# Patient Record
Sex: Male | Born: 1950
Health system: Southern US, Community
[De-identification: ages and names within clinical notes are randomized; demographics above are authoritative.]

## PROBLEM LIST (undated history)

## (undated) DIAGNOSIS — I639 Cerebral infarction, unspecified: Secondary | ICD-10-CM

## (undated) DIAGNOSIS — F329 Major depressive disorder, single episode, unspecified: Secondary | ICD-10-CM

## (undated) DIAGNOSIS — I2510 Atherosclerotic heart disease of native coronary artery without angina pectoris: Secondary | ICD-10-CM

## (undated) DIAGNOSIS — F32A Depression, unspecified: Secondary | ICD-10-CM

## (undated) DIAGNOSIS — I10 Essential (primary) hypertension: Secondary | ICD-10-CM

## (undated) DIAGNOSIS — F0390 Unspecified dementia without behavioral disturbance: Secondary | ICD-10-CM

## (undated) DIAGNOSIS — I519 Heart disease, unspecified: Secondary | ICD-10-CM

## (undated) DIAGNOSIS — R569 Unspecified convulsions: Secondary | ICD-10-CM

## (undated) DIAGNOSIS — R413 Other amnesia: Secondary | ICD-10-CM

## (undated) DIAGNOSIS — R51 Headache: Secondary | ICD-10-CM

## (undated) HISTORY — DX: Other amnesia: R41.3

## (undated) HISTORY — PX: WISDOM TOOTH EXTRACTION: SHX21

## (undated) HISTORY — PX: CORONARY ANGIOPLASTY: SHX604

## (undated) HISTORY — DX: Heart disease, unspecified: I51.9

---

## 1997-08-24 ENCOUNTER — Emergency Department (HOSPITAL_COMMUNITY): Admission: EM | Admit: 1997-08-24 | Discharge: 1997-08-24 | Payer: Self-pay | Admitting: Emergency Medicine

## 1998-07-24 ENCOUNTER — Inpatient Hospital Stay (HOSPITAL_COMMUNITY): Admission: EM | Admit: 1998-07-24 | Discharge: 1998-08-03 | Payer: Self-pay | Admitting: Emergency Medicine

## 1998-10-10 ENCOUNTER — Inpatient Hospital Stay (HOSPITAL_COMMUNITY): Admission: EM | Admit: 1998-10-10 | Discharge: 1998-10-13 | Payer: Self-pay | Admitting: Emergency Medicine

## 1999-11-18 ENCOUNTER — Encounter: Payer: Self-pay | Admitting: Emergency Medicine

## 1999-11-18 ENCOUNTER — Emergency Department (HOSPITAL_COMMUNITY): Admission: EM | Admit: 1999-11-18 | Discharge: 1999-11-19 | Payer: Self-pay | Admitting: Emergency Medicine

## 2000-06-07 ENCOUNTER — Encounter: Admission: RE | Admit: 2000-06-07 | Discharge: 2000-06-07 | Payer: Self-pay | Admitting: Family Medicine

## 2000-07-05 ENCOUNTER — Encounter: Admission: RE | Admit: 2000-07-05 | Discharge: 2000-07-05 | Payer: Self-pay | Admitting: Family Medicine

## 2000-11-01 ENCOUNTER — Encounter: Admission: RE | Admit: 2000-11-01 | Discharge: 2000-11-01 | Payer: Self-pay | Admitting: Family Medicine

## 2001-01-07 ENCOUNTER — Encounter: Admission: RE | Admit: 2001-01-07 | Discharge: 2001-01-07 | Payer: Self-pay | Admitting: Family Medicine

## 2001-03-21 ENCOUNTER — Encounter: Admission: RE | Admit: 2001-03-21 | Discharge: 2001-03-21 | Payer: Self-pay | Admitting: Family Medicine

## 2002-01-23 ENCOUNTER — Emergency Department (HOSPITAL_COMMUNITY): Admission: EM | Admit: 2002-01-23 | Discharge: 2002-01-23 | Payer: Self-pay | Admitting: Emergency Medicine

## 2002-01-23 ENCOUNTER — Encounter: Payer: Self-pay | Admitting: Emergency Medicine

## 2002-10-22 ENCOUNTER — Emergency Department (HOSPITAL_COMMUNITY): Admission: EM | Admit: 2002-10-22 | Discharge: 2002-10-22 | Payer: Self-pay | Admitting: Emergency Medicine

## 2002-10-22 ENCOUNTER — Encounter: Payer: Self-pay | Admitting: Emergency Medicine

## 2003-01-12 ENCOUNTER — Emergency Department (HOSPITAL_COMMUNITY): Admission: EM | Admit: 2003-01-12 | Discharge: 2003-01-12 | Payer: Self-pay | Admitting: Emergency Medicine

## 2003-05-26 ENCOUNTER — Emergency Department (HOSPITAL_COMMUNITY): Admission: EM | Admit: 2003-05-26 | Discharge: 2003-05-26 | Payer: Self-pay | Admitting: Emergency Medicine

## 2003-07-10 ENCOUNTER — Encounter: Admission: RE | Admit: 2003-07-10 | Discharge: 2003-07-10 | Payer: Self-pay | Admitting: Family Medicine

## 2003-07-20 ENCOUNTER — Encounter: Admission: RE | Admit: 2003-07-20 | Discharge: 2003-07-20 | Payer: Self-pay | Admitting: Sports Medicine

## 2003-08-10 ENCOUNTER — Encounter: Admission: RE | Admit: 2003-08-10 | Discharge: 2003-08-10 | Payer: Self-pay | Admitting: Family Medicine

## 2005-01-19 ENCOUNTER — Ambulatory Visit: Payer: Self-pay | Admitting: Family Medicine

## 2005-03-18 ENCOUNTER — Ambulatory Visit: Payer: Self-pay | Admitting: Family Medicine

## 2005-03-18 ENCOUNTER — Inpatient Hospital Stay (HOSPITAL_COMMUNITY): Admission: EM | Admit: 2005-03-18 | Discharge: 2005-03-24 | Payer: Self-pay | Admitting: Emergency Medicine

## 2005-03-20 ENCOUNTER — Ambulatory Visit: Payer: Self-pay | Admitting: Family Medicine

## 2005-08-02 ENCOUNTER — Ambulatory Visit: Payer: Self-pay | Admitting: Family Medicine

## 2005-08-16 ENCOUNTER — Ambulatory Visit: Payer: Self-pay | Admitting: Family Medicine

## 2005-08-29 ENCOUNTER — Ambulatory Visit: Payer: Self-pay | Admitting: Sports Medicine

## 2005-12-02 ENCOUNTER — Emergency Department (HOSPITAL_COMMUNITY): Admission: EM | Admit: 2005-12-02 | Discharge: 2005-12-02 | Payer: Self-pay | Admitting: Emergency Medicine

## 2005-12-15 ENCOUNTER — Ambulatory Visit: Payer: Self-pay | Admitting: Family Medicine

## 2006-01-11 ENCOUNTER — Ambulatory Visit: Payer: Self-pay | Admitting: Family Medicine

## 2006-03-08 ENCOUNTER — Ambulatory Visit: Payer: Self-pay | Admitting: Sports Medicine

## 2006-03-30 ENCOUNTER — Ambulatory Visit: Payer: Self-pay | Admitting: Family Medicine

## 2006-04-17 ENCOUNTER — Ambulatory Visit: Payer: Self-pay | Admitting: Family Medicine

## 2006-05-17 DIAGNOSIS — K649 Unspecified hemorrhoids: Secondary | ICD-10-CM | POA: Insufficient documentation

## 2006-05-17 DIAGNOSIS — M79609 Pain in unspecified limb: Secondary | ICD-10-CM

## 2006-05-17 DIAGNOSIS — K219 Gastro-esophageal reflux disease without esophagitis: Secondary | ICD-10-CM

## 2006-05-17 DIAGNOSIS — F339 Major depressive disorder, recurrent, unspecified: Secondary | ICD-10-CM | POA: Insufficient documentation

## 2006-05-17 DIAGNOSIS — F17200 Nicotine dependence, unspecified, uncomplicated: Secondary | ICD-10-CM | POA: Insufficient documentation

## 2006-05-17 DIAGNOSIS — I10 Essential (primary) hypertension: Secondary | ICD-10-CM | POA: Insufficient documentation

## 2006-05-30 ENCOUNTER — Ambulatory Visit: Payer: Self-pay | Admitting: Sports Medicine

## 2006-05-30 ENCOUNTER — Encounter (INDEPENDENT_AMBULATORY_CARE_PROVIDER_SITE_OTHER): Payer: Self-pay | Admitting: *Deleted

## 2006-05-30 LAB — CONVERTED CEMR LAB
Chloride: 107 meq/L (ref 96–112)
Glucose, Bld: 92 mg/dL (ref 70–99)
Potassium: 5 meq/L (ref 3.5–5.3)
Sodium: 142 meq/L (ref 135–145)

## 2006-06-04 ENCOUNTER — Telehealth: Payer: Self-pay | Admitting: *Deleted

## 2006-06-15 ENCOUNTER — Telehealth: Payer: Self-pay | Admitting: *Deleted

## 2006-08-08 ENCOUNTER — Emergency Department (HOSPITAL_COMMUNITY): Admission: EM | Admit: 2006-08-08 | Discharge: 2006-08-08 | Payer: Self-pay | Admitting: Emergency Medicine

## 2006-11-18 ENCOUNTER — Emergency Department (HOSPITAL_COMMUNITY): Admission: EM | Admit: 2006-11-18 | Discharge: 2006-11-18 | Payer: Self-pay | Admitting: Emergency Medicine

## 2006-11-18 ENCOUNTER — Ambulatory Visit: Payer: Self-pay | Admitting: Psychiatry

## 2006-11-18 ENCOUNTER — Inpatient Hospital Stay (HOSPITAL_COMMUNITY): Admission: AD | Admit: 2006-11-18 | Discharge: 2006-11-24 | Payer: Self-pay | Admitting: Psychiatry

## 2007-01-03 ENCOUNTER — Encounter (INDEPENDENT_AMBULATORY_CARE_PROVIDER_SITE_OTHER): Payer: Self-pay | Admitting: *Deleted

## 2007-01-03 ENCOUNTER — Ambulatory Visit: Payer: Self-pay | Admitting: Internal Medicine

## 2007-01-03 DIAGNOSIS — L84 Corns and callosities: Secondary | ICD-10-CM | POA: Insufficient documentation

## 2007-01-03 LAB — CONVERTED CEMR LAB
BUN: 12 mg/dL (ref 6–23)
CO2: 23 meq/L (ref 19–32)
Calcium: 8.8 mg/dL (ref 8.4–10.5)
Glucose, Bld: 76 mg/dL (ref 70–99)
Sodium: 143 meq/L (ref 135–145)

## 2007-01-14 ENCOUNTER — Encounter (INDEPENDENT_AMBULATORY_CARE_PROVIDER_SITE_OTHER): Payer: Self-pay | Admitting: *Deleted

## 2007-03-06 ENCOUNTER — Ambulatory Visit: Payer: Self-pay | Admitting: Family Medicine

## 2007-03-06 ENCOUNTER — Telehealth (INDEPENDENT_AMBULATORY_CARE_PROVIDER_SITE_OTHER): Payer: Self-pay | Admitting: *Deleted

## 2007-03-12 ENCOUNTER — Ambulatory Visit: Payer: Self-pay | Admitting: Sports Medicine

## 2007-04-02 ENCOUNTER — Encounter (INDEPENDENT_AMBULATORY_CARE_PROVIDER_SITE_OTHER): Payer: Self-pay | Admitting: *Deleted

## 2007-04-02 ENCOUNTER — Ambulatory Visit: Payer: Self-pay | Admitting: Family Medicine

## 2007-05-11 ENCOUNTER — Ambulatory Visit: Payer: Self-pay | Admitting: Psychiatry

## 2007-05-11 ENCOUNTER — Emergency Department (HOSPITAL_COMMUNITY): Admission: EM | Admit: 2007-05-11 | Discharge: 2007-05-11 | Payer: Self-pay | Admitting: Emergency Medicine

## 2007-05-11 ENCOUNTER — Inpatient Hospital Stay (HOSPITAL_COMMUNITY): Admission: AD | Admit: 2007-05-11 | Discharge: 2007-05-21 | Payer: Self-pay | Admitting: Psychiatry

## 2007-08-02 ENCOUNTER — Emergency Department (HOSPITAL_COMMUNITY): Admission: EM | Admit: 2007-08-02 | Discharge: 2007-08-02 | Payer: Self-pay | Admitting: Emergency Medicine

## 2007-08-03 ENCOUNTER — Ambulatory Visit: Payer: Self-pay | Admitting: *Deleted

## 2007-08-03 ENCOUNTER — Inpatient Hospital Stay (HOSPITAL_COMMUNITY): Admission: AD | Admit: 2007-08-03 | Discharge: 2007-08-09 | Payer: Self-pay | Admitting: *Deleted

## 2007-08-08 ENCOUNTER — Ambulatory Visit (HOSPITAL_COMMUNITY): Admission: RE | Admit: 2007-08-08 | Discharge: 2007-08-08 | Payer: Self-pay | Admitting: *Deleted

## 2007-08-13 ENCOUNTER — Emergency Department (HOSPITAL_COMMUNITY): Admission: EM | Admit: 2007-08-13 | Discharge: 2007-08-13 | Payer: Self-pay | Admitting: Emergency Medicine

## 2007-08-14 ENCOUNTER — Other Ambulatory Visit (HOSPITAL_COMMUNITY): Admission: RE | Admit: 2007-08-14 | Discharge: 2007-11-04 | Payer: Self-pay | Admitting: Psychiatry

## 2007-08-22 ENCOUNTER — Ambulatory Visit: Payer: Self-pay | Admitting: Psychiatry

## 2008-03-03 ENCOUNTER — Emergency Department (HOSPITAL_COMMUNITY): Admission: EM | Admit: 2008-03-03 | Discharge: 2008-03-04 | Payer: Self-pay | Admitting: Emergency Medicine

## 2008-03-04 ENCOUNTER — Inpatient Hospital Stay (HOSPITAL_COMMUNITY): Admission: EM | Admit: 2008-03-04 | Discharge: 2008-03-09 | Payer: Self-pay | Admitting: Psychiatry

## 2008-03-04 ENCOUNTER — Ambulatory Visit: Payer: Self-pay | Admitting: Psychiatry

## 2009-07-07 ENCOUNTER — Encounter: Payer: Self-pay | Admitting: Family Medicine

## 2009-07-07 ENCOUNTER — Ambulatory Visit (HOSPITAL_COMMUNITY): Admission: RE | Admit: 2009-07-07 | Discharge: 2009-07-07 | Payer: Self-pay | Admitting: Family Medicine

## 2009-07-07 ENCOUNTER — Ambulatory Visit: Payer: Self-pay | Admitting: Family Medicine

## 2009-07-07 DIAGNOSIS — F1020 Alcohol dependence, uncomplicated: Secondary | ICD-10-CM | POA: Insufficient documentation

## 2009-07-08 ENCOUNTER — Telehealth (INDEPENDENT_AMBULATORY_CARE_PROVIDER_SITE_OTHER): Payer: Self-pay | Admitting: *Deleted

## 2009-07-08 LAB — CONVERTED CEMR LAB
AST: 27 units/L (ref 0–37)
Alkaline Phosphatase: 99 units/L (ref 39–117)
BUN: 11 mg/dL (ref 6–23)
Creatinine, Ser: 1.07 mg/dL (ref 0.40–1.50)
HCT: 43.2 % (ref 39.0–52.0)
HDL: 113 mg/dL (ref >39)
Hemoglobin: 15 g/dL (ref 13.0–17.0)
LDL Cholesterol: 150 mg/dL — ABNORMAL HIGH (ref 0–99)
MCHC: 34.7 g/dL (ref 30.0–36.0)
Potassium: 4.1 meq/L (ref 3.5–5.3)
RDW: 15.2 % (ref 11.5–15.5)
Total CHOL/HDL Ratio: 2.5

## 2009-07-21 ENCOUNTER — Ambulatory Visit: Payer: Self-pay | Admitting: Family Medicine

## 2009-08-19 ENCOUNTER — Ambulatory Visit: Payer: Self-pay | Admitting: Family Medicine

## 2009-09-02 ENCOUNTER — Ambulatory Visit: Payer: Self-pay | Admitting: Family Medicine

## 2009-10-01 ENCOUNTER — Encounter: Payer: Self-pay | Admitting: Family Medicine

## 2009-10-01 ENCOUNTER — Ambulatory Visit: Payer: Self-pay | Admitting: Family Medicine

## 2009-10-01 LAB — CONVERTED CEMR LAB
Calcium: 9.5 mg/dL (ref 8.4–10.5)
Sodium: 140 meq/L (ref 135–145)

## 2009-10-04 ENCOUNTER — Telehealth: Payer: Self-pay | Admitting: *Deleted

## 2009-11-30 ENCOUNTER — Ambulatory Visit: Payer: Self-pay | Admitting: Family Medicine

## 2009-11-30 ENCOUNTER — Encounter: Payer: Self-pay | Admitting: Family Medicine

## 2009-11-30 ENCOUNTER — Observation Stay (HOSPITAL_COMMUNITY): Admission: EM | Admit: 2009-11-30 | Discharge: 2009-12-02 | Payer: Self-pay | Admitting: Family Medicine

## 2009-12-13 ENCOUNTER — Ambulatory Visit: Payer: Self-pay | Admitting: Family Medicine

## 2009-12-13 ENCOUNTER — Encounter: Payer: Self-pay | Admitting: Family Medicine

## 2009-12-13 DIAGNOSIS — E785 Hyperlipidemia, unspecified: Secondary | ICD-10-CM | POA: Insufficient documentation

## 2009-12-15 LAB — CONVERTED CEMR LAB
CO2: 24 meq/L (ref 19–32)
Chloride: 106 meq/L (ref 96–112)
HDL: 76 mg/dL (ref >39)
LDL Cholesterol: 135 mg/dL — ABNORMAL HIGH (ref 0–99)
Sodium: 140 meq/L (ref 135–145)
Total CHOL/HDL Ratio: 3

## 2009-12-24 ENCOUNTER — Ambulatory Visit: Payer: Self-pay | Admitting: Family Medicine

## 2010-02-24 ENCOUNTER — Emergency Department (HOSPITAL_COMMUNITY): Admission: EM | Admit: 2010-02-24 | Discharge: 2009-06-04 | Payer: Self-pay | Admitting: Emergency Medicine

## 2010-04-19 NOTE — Assessment & Plan Note (Signed)
Summary: f/u,df Recheck FLP,CMET 6 months   Vital Signs:  Patient profile:   60 year old male Height:      67 inches Weight:      148 pounds BMI:     23.26 BSA:     1.78 Temp:     98.8 degrees F Pulse rate:   80 / minute BP sitting:   160 / 100  Vitals Entered By: Jone Baseman CMA (December 24, 2009 11:20 AM) CC: F/U HTN, labs Is Patient Diabetic? No Pain Assessment Patient in pain? no        Primary Care Provider:  Milinda Antis MD  CC:  F/U HTN and labs.  History of Present Illness:     HTN- per report taking combo pill daily for the past 8 days, has not missed any doses. No chest pain, no leg swelling, no HA, feels good today.   Labs- reviewed lipid panel, need for statin    No alcohol since discharge from hospital 3 weeks ago now has AA sponsor  Habits & Providers  Alcohol-Tobacco-Diet     Tobacco Status: quit > 6 months     Tobacco Counseling: to remain off tobacco products     Cigarette Packs/Day: 1 ppd     Year Quit: 2007  Current Medications (verified): 1)  Lisinopril-Hydrochlorothiazide 20-25 Mg Tabs (Lisinopril-Hydrochlorothiazide) .Marland Kitchen.. 1 By Mouth Daily 2)  Aspir-Low 81 Mg Tbec (Aspirin) .Marland Kitchen.. 1 By Mouth Daily 3)  Multivitamins  Tabs (Multiple Vitamin) .Marland Kitchen.. 1 By Mouth Daily 4)  Simvastatin 10 Mg Tabs (Simvastatin) .Marland Kitchen.. 1 By Mouth Daily For Cholesterol 5)  Norvasc 5 Mg Tabs (Amlodipine Besylate) .Marland Kitchen.. 1 By Mouth Daily  Allergies (verified): No Known Drug Allergies  Past History:  Past Medical History: Completed drug/alcohol rehab in 4/04 in [place], hospitalized 1/07 for depresion w/ psychotic feat, Hx of Alcohol abuse, unspecified - 305.00 in [place],  Hx of Cocaine dependency NOS - 304.20 - in [place], hx of depression 296.30- resolved currently,  Hx of hematuria 09/1999 - no blood 04/05 hospitalized 9/08, 2010 for depression, suicide attempt, alcohol use HTN Hyperlipidemia  Physical Exam  General:  NAD, thin,, Vital signs  noted  Neck:  no cartoid bruit Lungs:  clear to auscultation bilaterally without wheezing, rales, or rhonchi.  Normal work of breathing  Heart:  Regular rate and rhythm without murmur, rub, or gallop.  Normal S1/S2  Pulses:  Radial, DP 2+ Extremities:  no edema    Impression & Recommendations:  Problem # 1:  HYPERTENSION, BENIGN SYSTEMIC (ICD-401.1) Assessment Improved   Mild improvement, pt previously on this medicine with improvement in BP. Will add low dose CCB, recheck in 2 weeks, goal < 140/90 His updated medication list for this problem includes:    Lisinopril-hydrochlorothiazide 20-25 Mg Tabs (Lisinopril-hydrochlorothiazide) .Marland Kitchen... 1 by mouth daily    Norvasc 5 Mg Tabs (Amlodipine besylate) .Marland Kitchen... 1 by mouth daily  Orders: [facility]- Est  Level 4 ([address]109)  Problem # 2:  HYPERLIPIDEMIA (ICD-272.4) Assessment: New  Aware of alcoholism, LFT wnl in April 2011. Will recheck in 6 months esp with low dose statin, discussed drinking while being on this medication His updated medication list for this problem includes:    Simvastatin 10 Mg Tabs (Simvastatin) .Marland Kitchen... 1 by mouth daily for cholesterol  Orders: [facility]- Est  Level 4 (99214)  Problem # 3:  ALCOHOLISM (ICD-303.90) Assessment: Unchanged  Now in AA with sponsor  Orders: [facility]- Est  Level 4 ([address]454)  Complete Medication List:  1)  Lisinopril-hydrochlorothiazide 20-25 Mg Tabs (Lisinopril-hydrochlorothiazide) .Marland Kitchen.. 1 by mouth daily 2)  Aspir-low 81 Mg Tbec (Aspirin) .Marland Kitchen.. 1 by mouth daily 3)  Multivitamins Tabs (Multiple vitamin) .Marland Kitchen.. 1 by mouth daily 4)  Simvastatin 10 Mg Tabs (Simvastatin) .Marland Kitchen.. 1 by mouth daily for cholesterol 5)  Norvasc 5 Mg Tabs (Amlodipine besylate) .Marland Kitchen.. 1 by mouth daily  Patient Instructions: 1)  Start the new blood pressure medication Novasc 5mg - take 1 tablet daily 2)  Continue taking the other blood pressure pill HCTZ/Lisinopril 3)  Start the cholesterol pill- Simvastatin -1 tablet daily 4)  Your next  visit is in 2 weeks  Prescriptions: NORVASC 5 MG TABS (AMLODIPINE BESYLATE) 1 by mouth daily  #30 x 3   Entered and Authorized by:   Milinda Antis MD   Signed by:   Milinda Antis MD on 12/24/2009   Method used:   Electronically to        [facility] [address] (retail)       [phone]       [place], [place]  [address]       Ph: [phone]       Fax: [phone]   RxID:   [id number] SIMVASTATIN 10 MG TABS (SIMVASTATIN) 1 by mouth daily for cholesterol  #30 x 3   Entered and Authorized by:   Milinda Antis MD   Signed by:   Milinda Antis MD on 12/24/2009   Method used:   Electronically to        [facility] [address] (retail)       [phone]       [place], [place]  [address]       Ph: [phone]       Fax: [phone]   RxID:   [id number]    Prevention & Chronic Care Immunizations   Influenza vaccine: Fluvax 3+  (01/03/2007)    Tetanus booster: 12/18/2000: Done.    Pneumococcal vaccine: Not documented    H. zoster vaccine: Not documented  Colorectal Screening   Hemoccult: negative  (03/06/2007)   Hemoccult due: 03/2008    Colonoscopy: Not documented  Other Screening   PSA: normal  (08/02/2005)   PSA due due: 08/2006   Smoking status: quit > 6 months  (12/24/2009)  Lipids   Total Cholesterol: 228  (12/13/2009)   LDL: 135  (12/13/2009)   LDL Direct: Not documented   HDL: 76  (12/13/2009)   Triglycerides: 86  (12/13/2009)    SGOT (AST): 27  (07/07/2009)   SGPT (ALT): 21  (07/07/2009)   Alkaline phosphatase: 99  (07/07/2009)   Total bilirubin: 0.7  (07/07/2009)    Lipid flowsheet reviewed?: Yes   Progress toward LDL goal: Unchanged  Hypertension   Last Blood Pressure: 160 / 100  (12/24/2009)   Serum creatinine: 1.18  (12/13/2009)   Serum potassium 4.2  (12/13/2009)    Hypertension flowsheet reviewed?: Yes   Progress toward BP goal: Improved  Self-Management  Support :   Personal Goals (by the next clinic visit) :      Personal blood pressure goal: 140/90  (10/01/2009)     Personal LDL goal: 100  (12/24/2009)    Hypertension self-management support: Not documented    Lipid self-management support: Not documented    Appended Document: f/u,df Recheck FLP,CMET 6 months pt declined flu shot today

## 2010-04-19 NOTE — Assessment & Plan Note (Signed)
Summary: hfu,df   Vital Signs:  Patient profile:   60 year old male Height:      67 inches Weight:      145.3 pounds BMI:     22.84 Pulse rate:   82 / minute BP sitting:   179 / 100  (right arm)  Vitals Entered By: Arlyss Repress CMA, (December 13, 2009 11:57 AM)  Serial Vital Signs/Assessments:  Time      Position  BP       Pulse  Resp  Temp     By 12:01 PM            180/102                        Arlyss Repress CMA,  Comments: 12:01 PM left arm. By: Arlyss Repress CMA,   CC: f/up HTN Is Patient Diabetic? No Pain Assessment Patient in pain? no        Primary Care Provider:  Milinda Antis MD  CC:  f/up HTN.  History of Present Illness:   patient s/p hospitalization for acute alcohol intoxication with prolonged ataxia. Pt has not had any ETOH per report, plans to attend AA today.  He is 45 minutes late for appt  HTN- has not taken any medications, on his way to pharmacy today, no HA,no chest pain  Habits & Providers  Alcohol-Tobacco-Diet     Tobacco Status: quit > 6 months     Tobacco Counseling: to remain off tobacco products     Year Quit: 2007  Current Medications (verified): 1)  Lisinopril-Hydrochlorothiazide 20-25 Mg Tabs (Lisinopril-Hydrochlorothiazide) .Marland Kitchen.. 1 By Mouth Daily 2)  Aspir-Low 81 Mg Tbec (Aspirin) .Marland Kitchen.. 1 By Mouth Daily 3)  Multivitamins  Tabs (Multiple Vitamin) .Marland Kitchen.. 1 By Mouth Daily  Allergies (verified): No Known Drug Allergies  Physical Exam  General:  NAD, thin, sitting up in chair, Vital signs noted  Lungs:  clear to auscultation bilaterally without wheezing, rales, or rhonchi.  Normal work of breathing  Heart:  Regular rate and rhythm without murmur, rub, or gallop.  Normal S1/S2  Neurologic:  Gait- limp on right side, no ataxia  mentation in tact no apparent hallucinations   Impression & Recommendations:  Problem # 1:  HYPERTENSION, BENIGN SYSTEMIC (ICD-401.1) Assessment Deteriorated Restart combo pill, pt BP fairly  controlled when taking it regularly. Check lytes His updated medication list for this problem includes:    Lisinopril-hydrochlorothiazide 20-25 Mg Tabs (Lisinopril-hydrochlorothiazide) .Marland Kitchen... 1 by mouth daily  Orders: [facility] ([address]-98119) [facility]- Est  Level 4 (14782)  Problem # 2:  ALCOHOLISM (ICD-303.90) Assessment: Deteriorated  Discussed abstinence, pt states he fell off the band wagon when he found out his brother was sick. Discussed AA, he does not want to go to rehab  Orders: [facility]- Est  Level 4 ([address]21)  Problem # 3:  HYPERLIPIDEMIA (ICD-272.4) Assessment: Unchanged Recheck lipids, likley will need statin Orders: Lipid-[facility] (30865-78469) [facility]- Est  Level 4 (62952)  Complete Medication List: 1)  Lisinopril-hydrochlorothiazide 20-25 Mg Tabs (Lisinopril-hydrochlorothiazide) .Marland Kitchen.. 1 by mouth daily 2)  Aspir-low 81 Mg Tbec (Aspirin) .Marland Kitchen.. 1 by mouth daily 3)  Multivitamins Tabs (Multiple vitamin) .Marland Kitchen.. 1 by mouth daily  Patient Instructions: 1)  Return in 1 week, to follow-up on your blood pressure  2)  We will discuss your labs at that time Prescriptions: MULTIVITAMINS  TABS (MULTIPLE VITAMIN) 1 by mouth daily  #30 x 12   Entered and Authorized by:   Kingsley Spittle  Richards MD   Signed by:   Milinda Antis MD on 12/13/2009   Method used:   Electronically to        [facility] [phone]* (retail)       [address]       [place], [place]  [address]       Ph: [phone]       Fax: [phone]   RxID:   [id number] LISINOPRIL-HYDROCHLOROTHIAZIDE 20-25 MG TABS (LISINOPRIL-HYDROCHLOROTHIAZIDE) 1 by mouth daily  #30 x 3   Entered and Authorized by:   Milinda Antis MD   Signed by:   Milinda Antis MD on 12/13/2009   Method used:   Electronically to        [facility] [phone]* (retail)       [address]       [place], [place]  [address]       Ph: [phone]       Fax: [phone]   RxID:   [id number]    Prevention & Chronic Care Immunizations    Influenza vaccine: Fluvax 3+  (01/03/2007)    Tetanus booster: 12/18/2000: Done.    Pneumococcal vaccine: Not documented    H. zoster vaccine: Not documented  Colorectal Screening   Hemoccult: negative  (03/06/2007)   Hemoccult due: 03/2008    Colonoscopy: Not documented  Other Screening   PSA: normal  (08/02/2005)   PSA due due: 08/2006   Smoking status: quit > 6 months  (12/13/2009)  Lipids   Total Cholesterol: 281  (07/07/2009)   LDL: 150  (07/07/2009)   LDL Direct: Not documented   HDL: 113  (07/07/2009)   Triglycerides: 92  (07/07/2009)    SGOT (AST): 27  (07/07/2009)   SGPT (ALT): 21  (07/07/2009)   Alkaline phosphatase: 99  (07/07/2009)   Total bilirubin: 0.7  (07/07/2009)  Hypertension   Last Blood Pressure: 179 / 100  (12/13/2009)   Serum creatinine: 1.20  (10/01/2009)   Serum potassium 4.3  (10/01/2009)    Hypertension flowsheet reviewed?: Yes   Progress toward BP goal: Deteriorated  Self-Management Support :   Personal Goals (by the next clinic visit) :      Personal blood pressure goal: 140/90  (10/01/2009)   Hypertension self-management support: Not documented    Lipid self-management support: Not documented

## 2010-04-19 NOTE — Assessment & Plan Note (Signed)
Summary: BP CHECK/KH  Nurse Visit  In for BP check today. BP checked manually using regular adult cuff. BP LA 130/94 , RA 124/88 pulse 84. Patient reports he is feeling much better. Has a follow up with Dr. Gilford Raid on 08/06/2009. He taking medication as directed. Dr. Jeanice Lim notified and she came in and spoke with patient. Continue on same med no change, follow up as planned. Theresia Lo RN  Jul 21, 2009 9:24 AM   Allergies: No Known Drug Allergies  Orders Added: 1)  No Charge Patient Arrived (NCPA0) [NCPA0]

## 2010-04-19 NOTE — Assessment & Plan Note (Signed)
Summary: BP CHECK/KH  Nurse Visit  BP checked manually . LA 140/90 , RA 140/90 pulse 80. patient did restart medication as directed and has taken today. appointment scheduled for follow up with Dr. Jeanice Lim for 09/16/2009 and will have labs done at that time. Theresia Lo RN  September 02, 2009 9:26 AM   Allergies: No Known Drug Allergies  Orders Added: 1)  No Charge Patient Arrived (NCPA0) [NCPA0]

## 2010-04-19 NOTE — Progress Notes (Signed)
Summary: Rx Prob  Phone Note Call from Patient Call back at Home Phone [phone]   Caller: Patient Summary of Call: Pt would like to have his rx from yesterday sent to the [facility] on [address]. Initial call taken by: Clydell Hakim,  July 08, 2009 11:12 AM  Follow-up for Phone Call        spoke with  patient and advised him to call [facility] on [address] and ask them to transfer Rx sent in yesterday to [facility] [address]. he voices understanding. gave him the phone number to call. Follow-up by: Theresia Lo RN,  July 08, 2009 11:25 AM

## 2010-04-19 NOTE — Assessment & Plan Note (Signed)
Summary: follow up Bp and labs/ls   Vital Signs:  Patient profile:   60 year old male Weight:      150.9 pounds Pulse rate:   73 / minute BP sitting:   143 / 84  (left arm) Cuff size:   regular  Vitals Entered By: Arlyss Repress CMA, (October 01, 2009 9:06 AM) CC: f/up HTN Is Patient Diabetic? No Pain Assessment Patient in pain? no        Primary Care Provider:  Milinda Antis MD  CC:  f/up HTN.  History of Present Illness:   f/u HTN and review labs  HTN- taking HCTZ/lisinopril without any complications, no chest pain, HA, SOB, leg swelling quit alcohol 3 weeks ago   currently without knee pain   Habits & Providers  Alcohol-Tobacco-Diet     Tobacco Status: quit > 6 months  Current Medications (verified): 1)  Lisinopril-Hydrochlorothiazide 20-25 Mg Tabs (Lisinopril-Hydrochlorothiazide) .Marland Kitchen.. 1 By Mouth Daily 2)  Aspir-Low 81 Mg Tbec (Aspirin) .Marland Kitchen.. 1 By Mouth Daily  Allergies (verified): No Known Drug Allergies  Social History: Smoking Status:  quit > 6 months  Physical Exam  General:  Well-developed,well-nourished,in no acute distress; alert,appropriate and cooperative throughout examination Vital signs noted    Impression & Recommendations:  Problem # 1:  HYPERTENSION, BENIGN SYSTEMIC (ICD-401.1) Assessment Improved Continue current meds repeat BMET for tolerance of ACEI Recheck CHolesterol at next visit His updated medication list for this problem includes:    Lisinopril-hydrochlorothiazide 20-25 Mg Tabs (Lisinopril-hydrochlorothiazide) .Marland Kitchen... 1 by mouth daily  Orders: Basic [facility] ([id number]) [facility]- Est Level  3 (40102)  Complete Medication List: 1)  Lisinopril-hydrochlorothiazide 20-25 Mg Tabs (Lisinopril-hydrochlorothiazide) .Marland Kitchen.. 1 by mouth daily 2)  Aspir-low 81 Mg Tbec (Aspirin) .Marland Kitchen.. 1 by mouth daily  Patient Instructions: 1)  Restart your blood pressure pill every day 2)  Continue taking your baby aspirin 3)  It is okay to take 1 advil a  day for knee pain 4)  The nurse will call with your lab results 5)  Return in 3 months- try to get a morning appt, do eat after midnight    Prevention & Chronic Care Immunizations   Influenza vaccine: Fluvax 3+  (01/03/2007)    Tetanus booster: 12/18/2000: Done.    Pneumococcal vaccine: Not documented    H. zoster vaccine: Not documented  Colorectal Screening   Hemoccult: negative  (03/06/2007)   Hemoccult due: 03/2008    Colonoscopy: Not documented  Other Screening   PSA: normal  (08/02/2005)   PSA due due: 08/2006   Smoking status: quit > 6 months  (10/01/2009)  Lipids   Total Cholesterol: 281  (07/07/2009)   LDL: 150  (07/07/2009)   LDL Direct: Not documented   HDL: 113  (07/07/2009)   Triglycerides: 92  (07/07/2009)  Hypertension   Last Blood Pressure: 143 / 84  (10/01/2009)   Serum creatinine: 1.07  (07/07/2009)   Serum potassium 4.1  (07/07/2009)    Hypertension flowsheet reviewed?: Yes   Progress toward BP goal: At goal  Self-Management Support :   Personal Goals (by the next clinic visit) :      Personal blood pressure goal: 140/90  (10/01/2009)   Hypertension self-management support: Not documented

## 2010-04-19 NOTE — Initial Assessments (Signed)
Visit Type:  Hospital Admission Primary Care Provider:  Milinda Antis MD  CC:  ataxia.  History of Present Illness: Pt. is a 60 y/o aam who was brought to [facility] by EMS after becoming intoxicated with Alcohol. He was held in the ER there till his alcohol levels came down from 400 to 115. They attempted to walk the patient and he was unable to walk. He was shaky and weak. They transferred the patient to [facility] for evaluation of new onset ataxia. The patient describes a drinking episode of 2 40 ounce bottles of beer and then passing out.  he has a history of alcohol and drug abuse and has been admitted to rehab facilities 3 times. He also sees AA occasionally.  Current Problems (verified): 1)  Alcoholism  (ICD-303.90) 2)  Special Screening For Malignant Neoplasms Colon  (ICD-V76.51) 3)  Callus, Toe  (ICD-700) 4)  Tobacco Dependence  (ICD-305.1) 5)  Leg Pain or Knee Pain  (ICD-729.5) 6)  Hypertension, Benign Systemic  (ICD-401.1) 7)  Hemorrhoids, Nos  (ICD-455.6) 8)  Gastroesophageal Reflux, No Esophagitis  (ICD-530.81) 9)  Depression, Major, Recurrent  (ICD-296.30)  Current Medications (verified): 1)  Lisinopril-Hydrochlorothiazide 20-25 Mg Tabs (Lisinopril-Hydrochlorothiazide) .Marland Kitchen.. 1 By Mouth Daily 2)  Aspir-Low 81 Mg Tbec (Aspirin) .Marland Kitchen.. 1 By Mouth Daily  Allergies (verified): No Known Drug Allergies  Past History:  Family History: Last updated: 25-May-2006 4 sisters, one died in 63`s of breast CA, 5 brothers, 2 with diabetes, father died, DM, ESRD, Mother died of stroke at 61; also had HTN.  Social History: Last updated: 07/07/2009 Hx of ETOH abuse -  detox at [facility], in [place], completed program at [facility]. relapsed 9/08.; quitting smoking- quit date 03/20/06; 2 cups of coffee per day; ; Lives alone,  daughters- Morrie Sheldon, [place]  Multiple Behavioral health admissions for [employer] POA   Risk Factors: Smoking Status: quit > 6 months  (10/01/2009) Packs/Day: 1 ppd (03/06/2007)  Past medical, surgical, family and social histories (including risk factors) reviewed, and no changes noted (except as noted below).  Past Medical History: Reviewed history from 07/07/2009 and no changes required. Completed drug/alcohol rehab in 4/04 in [place], hospitalized 1/07 for depresion w/ psychotic feat, Hx of Alcohol abuse, unspecified - 305.00 in remis,  Hx of Cocaine dependency NOS - 304.20 - in remissi, hx of depression 296.30- resolved currently,  Hx of hematuria 09/1999 - no blood 04/05 hospitalized 9/08, 2010 for depression, suicide attempt, alcohol use HTN  Past Surgical History: Reviewed history from 05-25-2006 and no changes required. Lumbar puncture:  negative rpr, hiv - 03/20/2005, MRI brain neg for stroke, tumor, bleed - 03/20/2005  Family History: Reviewed history from 05-25-06 and no changes required. 4 sisters, one died in 31`s of breast CA, 5 brothers, 2 with diabetes, father died, DM, ESRD, Mother died of stroke at 77; also had HTN.  Social History: Reviewed history from 07/07/2009 and no changes required. Hx of ETOH abuse -  detox at [facility], in [place], completed program at [facility]. relapsed 9/08.; quitting smoking- quit date 03/20/06; 2 cups of coffee per day; ; Lives alone,  daughters- Cyndy Freeze  Multiple Behavioral health admissions for SI  Morrie Sheldon POA   Review of Systems       The patient complains of muscle weakness and depression.  The patient denies fever, vision loss, decreased hearing, chest pain, dyspnea on exertion, and abdominal pain.    Physical Exam  General:  alert, well-developed, cooperative to examination, and underweight appearing.   Head:  normocephalic, atraumatic, and male-pattern balding.   Eyes:  vision grossly intact, pupils equal, pupils round, and pupils reactive to light.   Ears:  R ear normal and L ear normal.   Nose:  no external deformity, no external erythema, and no  nasal discharge.   Mouth:  edentulous.   Neck:  supple and full ROM.   Chest Wall:  no deformities and no tenderness.   Lungs:  normal respiratory effort, normal breath sounds, no crackles, and no wheezes.   Heart:  normal rate, regular rhythm, and no murmur.   Abdomen:  soft, non-tender, no distention, and no guarding.   Msk:  normal ROM.   Neurologic:  alert & oriented X3, cranial nerves II-XII intact, strength normal in all extremities, sensation intact to light touch, finger-to-nose normal, and ataxic gait.   Psych:  Oriented X3, normally interactive, not anxious appearing, depressed affect, and subdued.    Pt. is a 60 y/o aam with alcholic binge and new onset ataxia likely related to recent binge.  Impression & Recommendations:  Problem # 1:  ALCOHOLISM (ICD-303.90) Assessment Deteriorated Recent alcohol binge.  will continue with IV hydration and oral diet  Problem # 2:  Ataxia Assessment: New Likely related to his recent alcohol binge. patient is weak, but neurologically intact. Will observe for improvement with more rest and hydration.  Problem # 3:  HYPERTENSION, BENIGN SYSTEMIC (ICD-401.1)  His updated medication list for this problem includes:    Lisinopril-hydrochlorothiazide 20-25 Mg Tabs (Lisinopril-hydrochlorothiazide) .Marland Kitchen... 1 by mouth daily  Problem # 4:  FEN - GI SCD's subq heparin Protonix Regular diet  Problem # 5:  Disposition Will assess gait again in the morning, if his strength and gait return to normal he can be discharged.  Complete Medication List: 1)  Lisinopril-hydrochlorothiazide 20-25 Mg Tabs (Lisinopril-hydrochlorothiazide) .Marland Kitchen.. 1 by mouth daily 2)  Aspir-low 81 Mg Tbec (Aspirin) .Marland Kitchen.. 1 by mouth daily  Appended Document:  LABS:     137               135-145          mEq/L  Potassium (K)                            3.7               3.5-5.1          mEq/L  Chloride                                 100               96-112           mEq/L   CO2                                      24                19-32            mEq/L  Glucose                                  87  70-99            mg/dL  BUN                                      10                6-23             mg/dL  Creatinine                               1.15              0.4-1.5          mg/dL  GFR, Est Non African American            >60               >60              mL/min  GFR, Est African American                >60               >60              mL/min    Oversized comment, see footnote  1  Calcium                                  8.8               Alcohol: 283, 131 Drug Screen: Negative   WBC                                      8.1               4.0-10.5         K/uL  RBC                                      4.46              4.22-5.81        MIL/uL  Hemoglobin (HGB)                         13.9              13.0-17.0        g/dL  Hematocrit (HCT)                         40.6              39.0-52.0        %  MCV                                      91.0              78.0-100.0       fL  MCH -  31.1              26.0-34.0        pg  MCHC                                     34.2              30.0-36.0        g/dL  RDW                                      13.6              11.5-15.5        %  Platelet Count (PLT)                     293               150-400          K/uL   Bilirubin, Total                         1.1               0.3-1.2          mg/dL  Bilirubin, Direct                        0.1               0.0-0.3          mg/dL  Indirect Bilirubin                       1.0        h      0.3-0.9          mg/dL  Alkaline Phosphatase                     76                39-117           U/L  SGOT (AST)                               24                0-37             U/L  SGPT (ALT)                               17                0-53             U/L  Total  Protein                           7.2                6.0-8.3          g/dL  Albumin-Blood  4.0               3.5-5.2          g/dL  CT HEAD:  CT HEAD WITHOUT CONTRAST    Technique:  Contiguous axial images were obtained from the base of   the skull through the vertex without contrast.    Comparison: 08/13/2007    Findings: Chronic small vessel white matter ischemic changes are   identified.    No acute intracranial abnormalities are identified, including mass   lesion or mass effect, hydrocephalus, extra-axial fluid collection,   midline shift, hemorrhage, or acute infarction.    The visualized bony calvarium is unremarkable.    IMPRESSION:   No evidence of acute intracranial abnormality.    Chronic small vessel white matter ischemic changes.  Appended Document:      Primary Care Provider:  Milinda Antis MD   History of Present Illness: HPI:  60 yo M with PMH significant only for HTN and chronic alcoholism who presents today for admission to [facility] after being held in ED for over 17 hours for acute alcohol intoxicity.  Initially brough to ED by police due to public drunkeness and beligerrence.  In ED his initial EtOH levels were around 400 but came down to mid 100s while in ED.  When they tried to walk the patient, he was shaky and had difficulty walking.  EDP then called for admission.  Patient states he began drinking 2 days ago as his brother had a heart attack.  Pt told nurse he lost his job  days ago as well, although he told this examiner he lost his job over a year ago.  Initially states he passed out though when pressed on this issue admits that he did not.  Admits to 1 6 pack per week and 2 40 ounce beverages 2 days ago prior to coming to ED due to alcoholism.    Please see excellent R1 note for PMH, Medications, allergies, Family history, social history, and ROS.    Current Problems (verified): 1)  Alcoholism  (ICD-303.90) 2)  Special Screening For Malignant Neoplasms Colon   (ICD-V76.51) 3)  Callus, Toe  (ICD-700) 4)  Tobacco Dependence  (ICD-305.1) 5)  Leg Pain or Knee Pain  (ICD-729.5) 6)  Hypertension, Benign Systemic  (ICD-401.1) 7)  Hemorrhoids, Nos  (ICD-455.6) 8)  Gastroesophageal Reflux, No Esophagitis  (ICD-530.81) 9)  Depression, Major, Recurrent  (ICD-296.30)  Current Medications (verified): 1)  Lisinopril-Hydrochlorothiazide 20-25 Mg Tabs (Lisinopril-Hydrochlorothiazide) .Marland Kitchen.. 1 By Mouth Daily 2)  Aspir-Low 81 Mg Tbec (Aspirin) .Marland Kitchen.. 1 By Mouth Daily  Allergies (verified): No Known Drug Allergies  Physical Exam  General:  Thin male lying in hospital bed, conversant and pleasant.   Head:  normocephalic and atraumatic.   Eyes:  vision grossly intact, pupils equal, pupils round, and pupils reactive to light.   Ears:  R ear normal and L ear normal.   Nose:  External nasal examination shows no deformity or inflammation. Nasal mucosa are pink and moist without lesions or exudates. Mouth:  Oral mucosa pink and moist Dentition:  edentulous Neck:  No deformities, masses, or tenderness noted. Lungs:  clear to auscultation bilaterally without wheezing, rales, or rhonchi.  Normal work of breathing  Heart:  Regular rate and rhythm without murmur, rub, or gallop.  Normal S1/S2  Abdomen:  soft/nondistended/nontender.  Bowel sounds present and normoactive.  No organomegaly noted.   Msk:  normal ROM and strength Pulses:  R and  L carotid,radial,femoral,dorsalis pedis and posterior tibial pulses are full and equal bilaterally Extremities:  No clubbing, cyanosis, edema, or deformity noted with normal full range of motion of all joints.   Neurologic:  No cranial nerve deficits noted.  Plantar reflexes are down-going bilaterally. DTRs are symmetrical throughout. Sensory, motor and coordinative functions appear intact.  Finger to nose with some decreased speed but able to fully complete task.  Gait markedly diminished, unable to stand or walk without 2 person  assistance  Skin:  Intact without suspicious lesions or rashes Psych:  Oriented X3, memory intact for recent and remote, normally interactive, good eye contact, and not anxious appearing.     Impression & Recommendations:  Problem # 1:  ALCOHOLISM (ICD-303.90) Assessment Deteriorated Likely acute intoxication event as most likely etiology for recent difficulty walking.  Of note, I went back to further examine patient after leaving the room and he had walked into the bathroom without difficulty and without assistance.  Nurse helped patient back to bed and said his shakiness had greatly decreased. Patient also asked for food, stating this was his only request and only thing he needed.  Other concerns would be syphilis or B12 deficiency, although CBC is WNL and syphilis less likely without other symptoms or signs on exam.  Wil check in AM.  patient also appeared clinically dry on exam, willl give 75 cc fluids overnight as maintenance and then recheck Hgb in AM to make sure he is not hemo-concentrated.  CT scan negative in ED, less likely neuro etiology though this is always possibility.  Will need to re-evaluate in AM once EtOH levels have normalized.    Problem # 2:  HYPERTENSION, BENIGN SYSTEMIC (ICD-401.1) Assessment: Deteriorated Systolics >180.  Likely not taking his medication.  Will continue his current medication here.  No signs of bleed on CT exam.  Will need to follow closely.  If remains high or continues to rise, can add hydralazine or labetolol.  His updated medication list for this problem includes:    Lisinopril-hydrochlorothiazide 20-25 Mg Tabs (Lisinopril-hydrochlorothiazide) .Marland Kitchen... 1 by mouth daily  Problem # 3:  FEN/GI Fluids as above.  No need to continue this past tomorrow AM as he is tolerating by mouth intake.    Problem # 4:  Prophylaxis Heparin 5000 units subQ three times a day.  Will hold on PPI for now although patient does have history of GERD, not on any home meds.     Problem # 5:  Dispo Hopefully DC home tomorrow if clinical condition improved.    Complete Medication List: 1)  Lisinopril-hydrochlorothiazide 20-25 Mg Tabs (Lisinopril-hydrochlorothiazide) .Marland Kitchen.. 1 by mouth daily 2)  Aspir-low 81 Mg Tbec (Aspirin) .Marland Kitchen.. 1 by mouth daily

## 2010-04-19 NOTE — Assessment & Plan Note (Signed)
Summary: f/up blood meds,tcb   Vital Signs:  Patient profile:   60 year old male Weight:      150 pounds Temp:     98.2 degrees F oral Pulse rate:   85 / minute BP sitting:   192 / 110  (right arm) Cuff size:   regular  Vitals Entered By: Tessie Fass CMA (July 07, 2009 1:43 PM) CC: BP check Is Patient Diabetic? No Pain Assessment Patient in pain? yes     Location: right knee Intensity: 8   Primary Care Provider:  Milinda Antis MD  CC:  BP check.  History of Present Illness: Pt here to re-establish care with new PCP  1, ETOH abuse- drinks 2-3 cans of beer a day, on weekend increases but does not say how much, does not eat very much during the day    plans to restart AA meetings for alcohol  last behavioral admission was in Dec of last year for SI with alcohol on board At this time stressed out because he is  unable to find work , his fiances are limited and his daughter controls his money, he lives in an apartment by himself and finds that he is very bored and feels tied down . plans to go to unemployment office in morning to find work Pt admitted he was at  [facility]  approx  1 month ago for detox  No SI  2. HTN- stopped taking medication over a year ago, No chest pain, no SOB, occasional headaches  walks everywhere   reviewed medical and family history  Habits & Providers  Alcohol-Tobacco-Diet     Tobacco Status: quit  Current Medications (verified): 1)  None  Allergies (verified): No Known Drug Allergies  Past History:  Past Medical History: Completed drug/alcohol rehab in 4/04 in [place], hospitalized 1/07 for depresion w/ psychotic feat, Hx of Alcohol abuse, unspecified - 305.00 in remis,  Hx of Cocaine dependency NOS - 304.20 - in [facility], hx of depression 296.30- resolved currently,  Hx of hematuria 09/1999 - no blood 04/05 hospitalized 9/08, 2010 for depression, suicide attempt, alcohol use HTN  Social History: Hx of ETOH abuse -  detox at  [place], in [place], completed program at [facility]. relapsed 9/08.; quitting smoking- quit date 03/20/06; 2 cups of coffee per day; ; Lives alone,  daughters- Morrie Sheldon, [place]  Multiple Behavioral health admissions for SI  Morrie Sheldon POA Smoking Status:  quit  Physical Exam  General:  Well-developed,well-nourished,in no acute distress; alert,appropriate and cooperative throughout examination Vital signs noted  Eyes:  No vision grossly intact, pupils equal, pupils round, pupils reactive to light, and no optic disk abnormalities.   Mouth:  Poor dentition, MMM Neck:  No carotid bruit  Lungs:  CTAB Heart:  RRR, no mumur Abdomen:  soft, non-tender, normal bowel sounds, and no masses.   Pulses:  2+ Extremities:  No edema   Impression & Recommendations:  Problem # 1:  HYPERTENSION, BENIGN SYSTEMIC (ICD-401.1) Assessment Deteriorated No meds for at least 2 years, pt assymptatic , EKG shows RBBB and LVH secondary to HTN disease, will obtain lab work today, no other ekg could be found including hospital EMR. Start combo pill today RTC 2 weeks, will likley need 2-3 meds to control blood pressure discussed salt restriction, however will be diffuclt as pt does not eat very much secondary to calories with ETOH The following medications were removed from the medication list:    Norvasc 10 Mg Tabs (Amlodipine  besylate) .Marland Kitchen... Take 1 tablet by mouth once a day    Enalapril-hydrochlorothiazide 10-25 Mg Tabs (Enalapril-hydrochlorothiazide) .Marland Kitchen... 1 by mouth once daily His updated medication list for this problem includes:    Lisinopril-hydrochlorothiazide 20-25 Mg Tabs (Lisinopril-hydrochlorothiazide) .Marland Kitchen... 1 by mouth daily  Orders: Comp Met-[facility] [phone]) CBC-[facility] (40347) Lipid-[facility] ([id number]) [facility]- Est  Level 4 (64332)  Problem # 2:  ALCOHOLISM (ICD-303.90) Assessment: Improved  Per report ETOH use has improved, pt very bored at home, long standing history of depression, not on meds, encouraged  AA meetings will follow - this is a very diffucult problem based on his history pt also has poor intake secondary to etoh  Orders: [facility]- Est  Level 4 (99214)  Problem # 3:  DEPRESSION, MAJOR, RECURRENT (ICD-296.30) Assessment: Improved No recent BH admission for depression and ETOH binges, pt not on any meds at this will consider in the near future, his daughters are controlling his finances so he feels he has a lost a lot of control he is also out of work for 1 year contributing to stress. NO SI today Consider SSRI Orders: [facility]- Est  Level 4 (95188)  Complete Medication List: 1)  Lisinopril-hydrochlorothiazide 20-25 Mg Tabs (Lisinopril-hydrochlorothiazide) .Marland Kitchen.. 1 by mouth daily 2)  Aspir-low 81 Mg Tbec (Aspirin) .Marland Kitchen.. 1 by mouth daily  Patient Instructions: 1)  Return for nurse visit in 2 weeks to have your blood pressure checked 2)  Make an appt to see me in 1 month 3)  Your blood pressure is very high 4)  If you can take your blood pressure at [facility] and write it down 5)  We will call you with your lab results Prescriptions: ASPIR-LOW 81 MG TBEC (ASPIRIN) 1 by mouth daily  #30 x 3   Entered and Authorized by:   Milinda Antis MD   Signed by:   Milinda Antis MD on 07/07/2009   Method used:   Electronically to        [facility] [phone]* (retail)       [address]       [place], [place]  [address]       Ph: [phone]       Fax: [phone]   RxID:   [id number] LISINOPRIL-HYDROCHLOROTHIAZIDE 20-25 MG TABS (LISINOPRIL-HYDROCHLOROTHIAZIDE) 1 by mouth daily  #30 x 3   Entered and Authorized by:   Milinda Antis MD   Signed by:   Milinda Antis MD on 07/07/2009   Method used:   Electronically to        [facility] [address] [phone]* (retail)       [address]       [place], [place]  [address]       Ph: [phone]       Fax: [phone]   RxID:   [id number]   Appended Document: f/up blood meds,tcb Addition: Pt asked about his right knee, we did not have  time to go into detail secondary to his other medical problems, told to use ACE wrap  Exam- no swelling, no erythema  no effusion noted , strength equal in lower ext  -- Right knee Will RTC in 1 month to discuss further  Appended Document: f/up blood meds,tcb     Allergies: No Known Drug Allergies   Complete Medication List: 1)  Lisinopril-hydrochlorothiazide 20-25 Mg Tabs (Lisinopril-hydrochlorothiazide) .Marland Kitchen.. 1 by mouth daily 2)  Aspir-low 81 Mg Tbec (Aspirin) .Marland Kitchen.. 1 by mouth daily  Other Orders: EKG- [facility] (EKG)

## 2010-04-19 NOTE — Assessment & Plan Note (Signed)
Summary: FU/KH   Vital Signs:  Patient profile:   60 year old male Weight:      152 pounds Temp:     98.7 degrees F oral Pulse rate:   80 / minute BP sitting:   186 / 100  (right arm) Cuff size:   regular  Vitals Entered By: Tessie Fass CMA (August 19, 2009 1:46 PM) CC: F/U BP/ knee pain Is Patient Diabetic? No Pain Assessment Patient in pain? no        Primary Care Provider:  Milinda Antis MD  CC:  F/U BP/ knee pain.  History of Present Illness:   HTN- last nurse visit 23 weeks ago, BP wnl on medication. States he did not have money to pick up his prescription , so has been out of meds since Monday. Taking ASA 81mg . Denies headache, chest pain, vision changes  Knee pain- chronic knee pain x 20 years, started on his job. Taking advil 1 tab daily which relieves pain. Continues to walk on knee with little interuption in daily activities. Denies swelling, locking,pain located mostly on lateral aspect and popliteal region  Currently drinking 1 beer a day  Noted- pt had a death in the family, planning for funeral today, he plans to spend some time with his family members, feels a little stressed and feels that is also contributing to his blood pressure  Discussed [place] financial assistance and or need for Medicaid  Habits & Providers  Alcohol-Tobacco-Diet     Tobacco Status: quit  Current Medications (verified): 1)  Lisinopril-Hydrochlorothiazide 20-25 Mg Tabs (Lisinopril-Hydrochlorothiazide) .Marland Kitchen.. 1 By Mouth Daily 2)  Aspir-Low 81 Mg Tbec (Aspirin) .Marland Kitchen.. 1 By Mouth Daily  Allergies (verified): No Known Drug Allergies  Physical Exam  General:  Well-developed,well-nourished,in no acute distress; alert,appropriate and cooperative throughout examination Vital signs noted  Eyes:  No vision grossly intact, pupils equal, pupils round, pupils reactive to light, and no optic disk abnormalities.  mild AV nicking right eye Heart:  RRR, no mumur Msk:  Right Knee: Normal  to inspection with no erythema or effusion or obvious bony abnormalities. nor joint line tenderness or patellar tenderness or condyle tenderness. TTP in popliteal region with extenion  ROM normal in flexion and extension and lower leg rotation. Mild crepitus Ligaments with solid consistent endpoints including ACL, PCL, LCL, MCL. Hamstring and quadriceps strength is normal.   Extremities:  No edema   Impression & Recommendations:  Problem # 1:  HYPERTENSION, BENIGN SYSTEMIC (ICD-401.1) Assessment Deteriorated  Restart meds, previous adequate control, assymptomatic, recheck BMET at next visit His updated medication list for this problem includes:    Lisinopril-hydrochlorothiazide 20-25 Mg Tabs (Lisinopril-hydrochlorothiazide) .Marland Kitchen... 1 by mouth daily  Orders: [facility]- Est Level  3 (99213)  Problem # 2:  LEG PAIN OR KNEE PAIN (ICD-729.5) Assessment: Unchanged  Chronic knee pain, use as needed NSAIDS but limit to once daily with ETOH abuse  Orders: [facility]- Est Level  3 (16109)  Complete Medication List: 1)  Lisinopril-hydrochlorothiazide 20-25 Mg Tabs (Lisinopril-hydrochlorothiazide) .Marland Kitchen.. 1 by mouth daily 2)  Aspir-low 81 Mg Tbec (Aspirin) .Marland Kitchen.. 1 by mouth daily  Patient Instructions: 1)  Restart your blood pressure pill every day 2)  Continue taking your baby aspirin 3)  It is okay to take 1 advil a day for knee pain 4)  Come back for Blood pressure check in 2 weeks with the Nurse 5)  Follow-up in 1 month- at that visit check your kidneys Prescriptions: ASPIR-LOW 81 MG TBEC (ASPIRIN)  1 by mouth daily  #30 x 3   Entered and Authorized by:   Milinda Antis MD   Signed by:   Milinda Antis MD on 08/19/2009   Method used:   Electronically to        [facility] [address] (retail)       [phone]       [place], [place]  [address]       Ph: [phone]       Fax: [phone]   RxID:   [id number] LISINOPRIL-HYDROCHLOROTHIAZIDE 20-25 MG TABS  (LISINOPRIL-HYDROCHLOROTHIAZIDE) 1 by mouth daily  #30 x 3   Entered and Authorized by:   Milinda Antis MD   Signed by:   Milinda Antis MD on 08/19/2009   Method used:   Electronically to        [facility] [address] (retail)       [phone]       [place], [place]  [address]       Ph: [phone]       Fax: [phone]   RxID:   [id number]

## 2010-04-19 NOTE — Progress Notes (Signed)
Summary: re: labs and f/up ov/ts  ---- Converted from flag ---- ---- 10/03/2009 8:00 PM, Milinda Antis MD wrote: please let pt know his bloodwork was normal, including his potassium and kidney function. I will see him in 3 months ------------------------------  called pt and informed. pt agreed.

## 2010-04-20 ENCOUNTER — Encounter: Payer: Self-pay | Admitting: *Deleted

## 2010-06-02 LAB — HEPATIC FUNCTION PANEL
AST: 24 U/L (ref 0–37)
Albumin: 4 g/dL (ref 3.5–5.2)
Bilirubin, Direct: 0.1 mg/dL (ref 0.0–0.3)

## 2010-06-02 LAB — CBC
HCT: 40.6 % (ref 39.0–52.0)
MCH: 31.1 pg (ref 26.0–34.0)
MCHC: 34.2 g/dL (ref 30.0–36.0)
MCV: 87.4 fL (ref 78.0–100.0)
Platelets: 252 10*3/uL (ref 150–400)
RBC: 4.3 MIL/uL (ref 4.22–5.81)
RDW: 13.6 % (ref 11.5–15.5)
WBC: 6.7 10*3/uL (ref 4.0–10.5)

## 2010-06-02 LAB — DIFFERENTIAL
Basophils Absolute: 0 10*3/uL (ref 0.0–0.1)
Basophils Relative: 1 % (ref 0–1)
Eosinophils Absolute: 0.1 10*3/uL (ref 0.0–0.7)
Eosinophils Relative: 1 % (ref 0–5)
Monocytes Absolute: 0.6 10*3/uL (ref 0.1–1.0)
Neutro Abs: 4.2 10*3/uL (ref 1.7–7.7)

## 2010-06-02 LAB — URINALYSIS, ROUTINE W REFLEX MICROSCOPIC
Bilirubin Urine: NEGATIVE
Nitrite: NEGATIVE
Protein, ur: NEGATIVE mg/dL
Specific Gravity, Urine: 1.007 (ref 1.005–1.030)
Urobilinogen, UA: 0.2 mg/dL (ref 0.0–1.0)

## 2010-06-02 LAB — BASIC METABOLIC PANEL
BUN: 10 mg/dL (ref 6–23)
GFR calc non Af Amer: >60 mL/min (ref >60)
Glucose, Bld: 87 mg/dL (ref 70–99)
Potassium: 3.7 mEq/L (ref 3.5–5.1)

## 2010-06-02 LAB — RAPID URINE DRUG SCREEN, HOSP PERFORMED
Amphetamines: NOT DETECTED
Benzodiazepines: NOT DETECTED
Cocaine: NOT DETECTED
Tetrahydrocannabinol: NOT DETECTED

## 2010-06-02 LAB — ETHANOL: Alcohol, Ethyl (B): <5 mg/dL (ref 0–10)

## 2010-06-02 LAB — URINE CULTURE

## 2010-06-02 LAB — GLUCOSE, CAPILLARY: Glucose-Capillary: 103 mg/dL — ABNORMAL HIGH (ref 70–99)

## 2010-06-10 LAB — RAPID URINE DRUG SCREEN, HOSP PERFORMED
Amphetamines: NOT DETECTED
Benzodiazepines: NOT DETECTED
Cocaine: NOT DETECTED
Tetrahydrocannabinol: NOT DETECTED

## 2010-06-10 LAB — URINALYSIS, ROUTINE W REFLEX MICROSCOPIC
Bilirubin Urine: NEGATIVE
Hgb urine dipstick: NEGATIVE
Ketones, ur: NEGATIVE mg/dL
Nitrite: NEGATIVE
pH: 5.5 (ref 5.0–8.0)

## 2010-06-10 LAB — BASIC METABOLIC PANEL
CO2: 23 mEq/L (ref 19–32)
GFR calc non Af Amer: >60 mL/min (ref >60)
Glucose, Bld: 120 mg/dL — ABNORMAL HIGH (ref 70–99)
Potassium: 4.1 mEq/L (ref 3.5–5.1)
Sodium: 141 mEq/L (ref 135–145)

## 2010-06-10 LAB — CBC
HCT: 44.1 % (ref 39.0–52.0)
Hemoglobin: 14.8 g/dL (ref 13.0–17.0)
MCHC: 33.6 g/dL (ref 30.0–36.0)
RDW: 15.4 % (ref 11.5–15.5)

## 2010-06-10 LAB — DIFFERENTIAL
Basophils Absolute: 0 10*3/uL (ref 0.0–0.1)
Eosinophils Relative: 1 % (ref 0–5)
Lymphocytes Relative: 28 % (ref 12–46)
Monocytes Absolute: 0.5 10*3/uL (ref 0.1–1.0)

## 2010-06-10 LAB — TRICYCLICS SCREEN, URINE: TCA Scrn: NOT DETECTED

## 2010-06-10 LAB — ETHANOL: Alcohol, Ethyl (B): 255 mg/dL — ABNORMAL HIGH (ref 0–10)
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
## 2010-08-02 NOTE — H&P (Signed)
NAME:  Tyrone Noble, Tyrone Noble            ACCOUNT NO.:  [id number]   MEDICAL RECORD NO.:  [id number]          PATIENT TYPE:  IPS   LOCATION:  0505                          FACILITY:  [facility]   PHYSICIAN:  Geoffery Lyons, M.D.      DATE OF BIRTH:  Jul 22, 1950   DATE OF ADMISSION:  05/11/2007  DATE OF DISCHARGE:                       PSYCHIATRIC ADMISSION ASSESSMENT   IDENTIFYING INFORMATION:  This is a voluntary admission to the services  of Dr. Geoffery Lyons.  This is a 59 year old divorced African-American  male.  The [facility] police found Tyrone Noble on the side of the road  mumbling yesterday.  When asked if he had pain, he reported chest pain.  He was brought to the [facility] ED where he was medically cleared.  His  urine drug screen was negative.  His alcohol level was 83.  He had no  positive signs or symptoms for a heart attack, and it was felt that a  panic attack should be ruled out.   PAST PSYCHIATRIC HISTORY:  Tyrone Noble was last with [place] August 31 to  November 24, 2006, also for alcohol intoxication at that time.  He has  had admissions to other facilities.  He cannot recall the exact names or  dates at this time, but this is a chronic issue for him.   SOCIAL HISTORY:  He finished high school.  He has been married and  divorced once.  He has 2 daughters.  He is not sure their ages but he  thinks they are both between 33 and 38.  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   FAMILY HISTORY:  He states his father had alcohol issues.   ALCOHOL AND DRUG ABUSE:  He reports drinking 3-4 40-ounce beers a day  and he does smoke 1 pack per day.   PAST MEDICAL HISTORY:  Primary care [name]:  He denies having one,  although he has been followed at [facility] by Dr.  Caleen Jobs, although he is noncompliant with his medications.  He is  supposed to be going to the [facility], however it  is  unclear whether he has ever actually gone there for care.  Medical  problems:  He is known to have hypertension.   MEDICATIONS:  None.  He has not picked any up in 2 months.   POSITIVE PHYSICAL FINDINGS:  PHYSICAL EXAMINATION:  His physical  examination is documented and on the chart from the [facility] ED.  His  vital signs on admission to our unit show he is 67 inches tall, weighs  138, temperature is 97.8, blood pressure was elevated at 192/118 to  191/118, pulse ranged from 74 to 81, respirations are 18.  As already  stated, the only remarkable findings on his lab work was his alcohol  level of 83.  Initially last night prior to being given Librium he did  have some tremor but that resolved.   MENTAL STATUS EXAM:  Today he is alert and oriented.  He is casually  dressed.  He is appropriately groomed.  He could use a little more  nourishment.  His speech is not pressured.  His mood is appropriate to  the situation.  His affect has a decreased range.  Thought processes are  clear, rational and goal oriented.  He reports that he needs to go back  to Alcoholics Anonymous.  Judgment and insight are fair.  Concentration  and memory superficially are intact.  Intelligence is average.  He  denies being suicidal or homicidal.  He denies auditory or visual  hallucinations.   ADMISSION DIAGNOSES:  AXIS I:  Alcohol dependence.  AXIS II:  Deferred.  AXIS III:  Reports being diagnosed with hypertension, arthritis,  insomnia.  AXIS IV:  Issues with primary support group.  AXIS V:  Global assessment of function is 30.   PLAN:  To admit to detox.  Toward that end he was started on the low-  dose Librium protocol.  We will restart an anti-hypertensive.  We will  restart him on the clonidine patch, and we will also initiate Celexa as  this is $4 a month medication.   ESTIMATED LENGTH OF STAY:  3-4 days.      Mickie Leonarda Salon, P.A.-C.      Geoffery Lyons, M.D.  Electronically  Signed    MD/MEDQ  D:  05/12/2007  T:  05/12/2007  Job:  [id number]
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
## 2010-08-02 NOTE — H&P (Signed)
NAME:  Tyrone Noble, Tyrone Noble NO.:  [id number]   MEDICAL RECORD NO.:  [id number]          PATIENT TYPE:  IPS   LOCATION:  0302                          FACILITY:  [facility]   PHYSICIAN:  Jasmine Pang, M.D. DATE OF BIRTH:  January 13, 1951   DATE OF ADMISSION:  08/03/2007  DATE OF DISCHARGE:                       PSYCHIATRIC ADMISSION ASSESSMENT   HISTORY OF PRESENT ILLNESS:  The patient was initially assessed at [facility] emergency department.  Initially the patient was assessed by EMS  after the patient was stumbling around the parking lot waving a box  cutter.  He apparently had walked into traffic.  The police were called.  The patient was complaining of chest pain at that time and was  transported to the emergency department evaluation of such.  It states  in the chart that the patient was again brandishing this box cutter,  threatening to kill, ranting.  Today, the patient reports that he was  drinking alcohol, has been under some stress due to his occupation.  He  states he has been having trouble sleeping.  He does report drinking on  an ongoing basis and currently denies any suicidal thoughts at this  time.   PAST PSYCHIATRIC HISTORY:  This is first admission to [facility].  He does state that he sees a psychiatrist.  He is unable to  recall where this is and who his psychiatrist is.   SOCIAL HISTORY:  He is a 60 year old single male who lives in  [place].  He is currently lives with his oldest daughter.  He works  Forensic psychologist.  The patient wants disability.  He states his job is very  stressful.  He denies any legal issues at this time.   FAMILY HISTORY:  None.   ALCOHOL AND DRUG HISTORY:  The patient smokes and again has been  drinking recently.  He has been drinking wine.  Denies any drug use.   PRIMARY CARE PHYSICIAN:  Primary care Galit Urich is Dr. Seleta Rhymes at [facility].   PAST MEDICAL HISTORY:  Hypertension.   MEDICATIONS:  Is noncompliant with his hypertensive medications and is  uncertain as to what the medication is.   DRUG ALLERGIES:  PENICILLIN.   PHYSICAL EXAMINATION:  GENERAL:  This is a thin, middle-aged male who  was fully assessed at [facility] emergency department.  His records were  reviewed.  He was disruptive.  He received Geodon 20 mg in route to the  emergency room.  With his complaints of chest pain, the patient did  receive aspirin and nitroglycerin.  VITAL SIGNS:  Temperature is 98.3, heart rate 108, respirations 20,  blood pressure is 157/81, 97% saturated, 5 feet 6-1/2 inches tall, 135  pounds.   LABORATORY DATA:  Chest x-ray with bibasilar atelectasis.   Hematocrit of 37.7.  Urine drug screen was negative.  His alcohol level  was 181.   MENTAL STATUS EXAM:  The patient is quite sleepy.  He does arouse  somewhat.  He has poor eye contact.  He is casually dressed and  appropriately dressed.  Speech:  He mumbles at times.  We have to have  the patient repeat his answers.  The patient's mood is depressed.  He  does appears sad.  He did get teary-eyed at times when talking about his  work and how stressful it is.  Thought process:  He currently denies any  suicidal or homicidal thoughts.  No evidence of any delusional thinking.  Cognitive functioning:  He appears to be aware of himself and situation.  Poor recall as to past events that got him into the hospital.  Still  somewhat of a poor historian.   DIAGNOSES:   AXIS I:  Major depressive disorder.  Alcohol abuse, rule out dependence.   AXIS II:  Deferred.   AXIS III:  Hypertension.   AXIS IV:  Problems with occupation, other psychosocial problems.   AXIS V:  Current is 35-40.   Plan to contract for safety.  Will detox the patient with the Librium  protocol, work on relapse as mentioned.  We will continue to assess  comorbidities and gather more history.  Will have a family session with  the patient's support  group.  Will reinforce medication compliance.  The  patient is to follow up with his health issues.  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  Tentative length of stay is 4-5 days.      Landry Corporal, N.P.      Jasmine Pang, M.D.  Electronically Signed    JO/MEDQ  D:  08/04/2007  T:  08/04/2007  Job:  [id number]

## 2010-08-02 NOTE — Discharge Summary (Signed)
NAME:  Tyrone Noble, Tyrone Noble NO.:  [id number]   MEDICAL RECORD NO.:  [id number]          PATIENT TYPE:  IPS   LOCATION:  0302                          FACILITY:  BH   PHYSICIAN:  Jasmine Pang, M.D. DATE OF BIRTH:  [birth year]   DATE OF ADMISSION:  08/03/2007  DATE OF DISCHARGE:  08/09/2007                               DISCHARGE SUMMARY   IDENTIFICATION:  This is a 60 year old single African American male from  [place].   HISTORY OF PRESENT ILLNESS:  The patient was initially assessed at [facility] Emergency Department.  He was initially assessed by EMS after the  patient was stumbling around the parking lot waiting a box cutter.  He  apparently had walked into traffic.  The police were called.  The  patient was complaining of chest pain at that time.  He was transported  to the emergency department evaluation of such.  It states in the chart  that the patient was again brandishing this box cutter, threatened to  kill, ranting and raving.  Today, the patient reports that he was  drinking alcohol and has been under some stress due to his occupation.  He states that he has been having trouble sleeping.  He does report  drinking on an ongoing basis and currently denies any suicidal thoughts  at this time.   PAST PSYCHIATRIC HISTORY:  This is the first admission to [facility].  He does state that he sees a psychiatrist.  He is unable  to recall where this is and who his psychiatrist is.   FAMILY HISTORY:  None.   ALCOHOL AND DRUG HISTORY:  The patient smokes and again has been  drinking recently.  He has been drinking wine.  He denies any drug use.   PAST MEDICAL HISTORY:  Hypertension.   MEDICATIONS:  The patient is noncompliant with his hypertensive  medications and is uncertain as to what the medications are.   DRUG ALLERGIES:  PENICILLIN.   PHYSICAL EXAM:  There were no acute physical or medical problems noted.  He was fully assessed at  the [facility] Emergency Department.   LABORATORY DATA:  Chest x-ray with bibasilar atelectasis.  Hematocrit of  37.7.  Urine drug screen was negative.  Alcohol level was 181.   HOSPITAL COURSE:  Upon admission, the patient was started on Librium  detox protocol.  He was also ordered a PT consult due to his difficulty  ambulating with left foot pain.  An x-ray of the left femur was done,  which revealed no fractures.  In sessions with me, the patient was  initially lethargic.  His speech was soft and slow.  There was  psychomotor retardation.  He was disheveled. He was depressed and  anxious.  There was no evidence of any psychosis at that time.  As  hospitalization progressed, he became less depressed and anxious.  He  was tolerating the detox protocol well.  He had some sedation as a side  effect.  He stated he wanted long-term treatment.  He wants a place to  stay.  He continued to talk  about a 28-day program and wanted this.  On  Aug 06, 2007, he became somewhat more depressed and anxious with  positive suicidal ideation, but contracted for safety here.  By Aug 07, 2007, he was less depressed, less anxious.  He was still focused on the  long-term treatment.  He discussed his support from his daughters and  sister.  He was possibly having some side effects from his medication  from the Librium detox protocol.  He talked about wanting disability and  does not feel he could work.  On Aug 09, 2007,   Cancelled by the dictator.      Jasmine Pang, M.D.  Electronically Signed     BHS/MEDQ  D:  09/11/2007  T:  09/12/2007  Job:  [id number]

## 2010-08-05 NOTE — Discharge Summary (Signed)
NAME:  Tyrone Noble, Tyrone Noble NO.:  [id number]   MEDICAL RECORD NO.:  [id number]          PATIENT TYPE:  IPS   LOCATION:  0507                          FACILITY:  BH   PHYSICIAN:  Geoffery Lyons, M.D.      DATE OF BIRTH:  [birth year]   DATE OF ADMISSION:  03/04/2008  DATE OF DISCHARGE:  03/09/2008                               DISCHARGE SUMMARY   CHIEF COMPLAINT AND PRESENT ILLNESS:  This was one of multiple  admissions to [facility] for this 60 year old African  American male who endorsed suicidal thoughts after loss of his job.  Relapsed on alcohol and reports he wants to drink himself to death.  His  boss called the police complaining that he had communicated threats.  He  had been drinking daily, has been agitated and combative on  presentation.  Alcohol level 176.  Did endorse he was overwhelmed and  expressed suicidal ideation to his daughter.   PAST PSYCHIATRIC HISTORY:  Third or fourth admission to [facility].  Last one in May 2009.  Extensive followup at the [facility] chemical dependency intensive outpatient program.  He  also previously was in [facility],  had been  prescribed Antabuse but never took it.  Has had up to 5 years of  abstinence.   ALCOHOL HISTORY:  As already stated, recent relapse on alcohol.   MEDICAL HISTORY:  Hypertension.   MEDICATION:  1. Norvasc 10 mg per day.  2. Hydrochlorothiazide 25 mg per day.   PHYSICAL EXAMINATION:  Failed to show any acute findings.   LABORATORY WORKUP:  Results not available in the chart.   MENTAL STATUS EXAMINATION:  Reveals an alert, cooperative male.  Mood  depressed.  Affect depressed.  Thought processes logical, coherent and  relevant.  Endorsed being upset, angry about loss of his job.  Endorsed  suicidal ideations.  No active plan.  No homicidal ideas, no delusions.  No hallucinations.  Cognition well-preserved.   DIAGNOSES:  AXIS  I:  Alcohol dependence.  Depressive disorder not  otherwise specified.  AXIS II:  No diagnosis.  AXIS III:  Hypertension.  AXIS IV:  Moderate.  AXIS V:  On admission 35, GAF in the last year 65.   COURSE IN THE HOSPITAL:  Was admitted, started individual and group  psychotherapy.  We detoxified with Librium.  He was maintained on his  blood pressure medication.  As already stated, he lost his job after 26  years and he was accused of communicating threats to his boss.  He has  to go to court.  He went back to work on the 15th, was asked to leave,  we do not need you.  Staying with his daughter.  Endorsed decreased  sleep.  We pursued the detox, that went uneventfully.  He was able to  open up about his anger and his frustration of having lost his job and  having to go to court for communicating threats.  He did say he felt  safe in the hospital.  We worked on relapse prevention.  March 07, 2008,  he endorsed he was getting better.  Did not know where he was to  be emotionally.  Would like to continue to work on self.  Endorsed a lot  of stress when he gets out of the hospital, having to go to find a job,  going to court.  On March 08, 2008 he was committed to abstinence,  wanted to be considered for discharge.  On March 09, 2008, he was in  full contact with reality.  No active suicidal or homicidal ideas, no  hallucinations or delusions.  No withdrawal.  Willing and motivated to  pursue outpatient treatment.  Committed to abstinence.   DISCHARGE DIAGNOSES:  AXIS I:  Alcohol dependence.  Depressive disorder,  not otherwise specified.  AXIS II:  No diagnosis.  AXIS III:  Hypertension.  AXIS IV:  Moderate.  AXIS V  On discharge 50, 55.   DISCHARGE MEDICATIONS:  1. Norvasc 3 mg per day.  2. Hydrochlorothiazide 25 mg per day.  3. Trazodone 50 mg at bedtime.   FOLLOWUP:  By Dr. Lang Snow and Malissa Hippo in [facility].      Geoffery Lyons, M.D.  Electronically  Signed     IL/MEDQ  D:  04/08/2008  T:  04/08/2008  Job:  [id number]

## 2010-08-05 NOTE — Discharge Summary (Signed)
NAME:  Tyrone Noble, Tyrone Noble            ACCOUNT NO.:  [id number]   MEDICAL RECORD NO.:  [id number]          PATIENT TYPE:  IPS   LOCATION:  0506                          FACILITY:  BH   PHYSICIAN:  Geoffery Lyons, M.D.      DATE OF BIRTH:  Aug 02, 1950   DATE OF ADMISSION:  11/18/2006  DATE OF DISCHARGE:  11/24/2006                               DISCHARGE SUMMARY   CHIEF COMPLAINT:  This was the first admission to [facility] for this 60 year old male, single, voluntarily admitted.  Presented to the emergency room, alcohol level 141.  He was inebriated  and confused.  He was accompanied by his family and at that time  expressed a desire to kill himself, possibly walk into traffic.  Relapsed on alcohol one month prior to this admission after 5 years of  abstinence.  Felt that the relapse was triggered  by work stress and  financial stressors, trying to make ends meet, living at his daughter's  home.  At the ED was disoriented, agitated and almost fell off a  stretcher.  Initially resistant with staff.   PAST PSYCHIATRIC HISTORY:  First time at [facility],  prior admission to [facility] and 2 or 3 admissions to [facility].   ALCOHOL AND DRUG HISTORY:  Drinking since age 2, has been assessed to  have some depression, was prescribed Wellbutrin, but he did not take it.   MEDICAL HISTORY:  Hypertension, endorsed left knee pain and generalized  arthritis.   MEDICATIONS:  Wellbutrin dose unknown, hydrochlorothiazide, Norvasc 10  mg per day.   PHYSICAL EXAMINATION:  Performed failed to show any acute findings.   LABORATORY WORKUP:  Potassium 4.0, SGOT 19, SGPT 16, total bilirubin  0.7.  TSH 2.002.   MENTAL STATUS EXAM:  Reveals alert cooperative male, very soft spoken,  polite, requesting detox.  Unable to control his drinking, mood  depressed.  Affect depressed.  Thought process Logical, and relevant.  No evidence of active delusional ideas, no suicidal or  homicidal  ideations.  No hallucinations.  Cognition well-preserved.   ADMITTING DIAGNOSES:  AXIS I: Alcohol dependence.  Depressive disorder  not otherwise specified.  AXIS II: No diagnosis.  AXIS III:  High blood pressure.  AXIS IV: Moderate.  AXIS V:  On admission 38, highest GAF in the last year 60.   COURSE IN THE HOSPITAL:  He was admitted, started individual and group  psychotherapy, was given trazodone for sleep he was detoxified with  Librium.  He was placed on hydrochlorothiazide 25 mg per day and Norvasc  10 mg per day. He endorsed that he was having a very hard time,  depression, withdrawal, problems with his job, living with daughter.  No  stable living situation trying to get subsidized housing, frustrated  about finances, drinking mostly beers six-pack plus of beer per day.  Initially with some agitation and tremors.  He was very vague, having a  hard time given a history.  September 3 continued to have a very hard  time, feeling lightheaded. Vital signs within normal limits, anxious,  worried, unsure what  he was going to do once he got out of the hospital.  Some slow processing.  Deferring decisions to his daughter.  Very  concrete, could not see himself going back to work.  So we pursued the  detox, relapse prevention and coping skills. There was a family session  with his daughter. The daughter was pretty firm that she was not going  to allow him to stay in the house if he was to be drinking.  Endorsed  cravings and internal feelings of shakiness, lightheaded.  He pretty  much was pretty constricted and provided little information, not as  spontaneous.  Daughter encouraged him to resume the Wellbutrin and stay  on it.  There was the thought of changing jobs but he had been there 25  years, did not want to let the 25 years go.  He was going to look into  it so the daughter was pretty supportive.  September 4 he was turning  around, was anticipating going back home,  was going to allow the  daughter to help him.  Main concern was going back to work, feeling that  he was not going to be able to handle it being back there.  September 5  endorsed that he was better, has decided to drop one of his girlfriends,  the one who drinks, comitted to abstinence.  Still worried about going  back to work, but not sure he was going to stay in that job, otherwise  better.  Mood improved, affect brighter and by sep 6, he was in full  contact with reality.  There were no active suicidal or hom ideas, no  hallucinations or delusions.  Was planning to go with the daughter and  go to meetings and continue to work on his abstinence.   DISCHARGE DIAGNOSES:  AXIS I: Alcohol dependence.  Depressive disorder  not otherwise specified.  AXIS II: No diagnosis.  AXIS III: High blood pressure.  AXIS IV: Moderate.  AXIS V:  On discharge 55-60.   Discharged on hydrochlorothiazide 25 mg per day, Norvasc 5 mg per day.   Follow-up Dr. Milagros Evener.      Geoffery Lyons, M.D.  Electronically Signed     IL/MEDQ  D:  12/14/2006  T:  12/15/2006  Job:  [id number]

## 2010-08-05 NOTE — Discharge Summary (Signed)
NAME:  Tyrone Noble, Tyrone Noble            ACCOUNT NO.:  [id number]   MEDICAL RECORD NO.:  [id number]          PATIENT TYPE:  IPS   LOCATION:  0505                          FACILITY:  BH   PHYSICIAN:  Geoffery Lyons, M.D.      DATE OF BIRTH:  September 02, 1950   DATE OF ADMISSION:  05/11/2007  DATE OF DISCHARGE:  05/21/2007                               DISCHARGE SUMMARY   CHIEF COMPLAINT:  This was the second admission to [facility] for this 60 year old divorced African-American male.  Police found him on the side of the road mumbling.  When asked if he had  pain he reported chest pain.  He was brought to [facility] ED.  Was  medically cleared.  UDS was negative.  Alcohol level was 83.  He was  assessed to have chest pain noncardiac origin.   PAST MEDICAL HISTORY:  Admitted August 31 to November 24, 2006; alcohol  intoxication at that particular time.  Has had admission to other  facilities.   PSYCHIATRIC HISTORY:  As already stated.  Persistent use of alcohol,  drinking 3-4 40 oz beers a day.   PAST MEDICAL HISTORY:  Hypertension.   MEDICATIONS:  None in two months.   PHYSICAL EXAMINATION:  Failed to show any acute findings.   MENTAL HEALTH EXAMINATION:  Reveals an alert cooperative male  appropriately groomed.  He was not pressured.  Mood was anxious.  Affect  was constricted.  Thought processes were clear, rational, and goal-  oriented.  Endorsed he wanted to go back to work __________  .  No  active suicidal or homicidal ideas, no delusions, no hallucinations.  Cognition well-preserved.   IMPRESSION:  AXIS I:  Alcohol dependence, depressive disorder not  otherwise specified.  AXIS II:  No diagnosis.  AXIS III:  Hypertension, arthritis.  AXIS IV:  Moderate.  AXIS V:  Upon admission 30, highest GAF in the last year 55.   COURSE IN THE HOSPITAL:  He was admitted.  Started in individual and  group psychotherapy.  He was detoxified with Librium.  He was placed  on  clonidine and Celexa.  Eventually clonidine was discontinued.  As  already stated 60 year old male who was admitted August 31st to  November 24, 2006 due to alcohol dependence and depressive disorder NOS  resumed drinking shortly after discharge.  Endorsed stress at work as  one of the precipitating and then during stressors that helped to keep  his drinking going.  Staying with his daughter he claimed he drinks 3-4  40 oz beers daily.  He was seen staggering.  He was picked up.  Also  endorsed that chest pain.  Has been at the same job 26 years making less  than $9 dollars per hour and not progressing any further, pretty  frustrated.  His feels not recognized at work.  History of arterial  hypertension, noncompliant, initially mood depressed, affect depressed.  Endorsed that he needed help.  He continued to be detoxed  endorsing a  headache, stomach upset, mostly in bed wanting to go to rehab.  Was  afraid of  relapse and what could happen could be worse .  He continued  to be in bed for most part of the hospitalization not doing too well,  feeling weak, lightheadedness.  He continued to stabilize, somewhat  somatically focused.  In addition the clonidine patch was discontinued  as the blood pressure was running on the low side.  On February 26 he  was still feeling very weak.  Has  not been eating or drinking as much.  He had multiple somatic complaints.  Psychomotor:  Left motor  retardation.  Thursday afternoon he was staring in the corner.  February  27 he was better, more alert, but through the night we continued to  detox; address his nutrition and hydration needs.  We worked __________  prevention. March 2 he was back eating, drinking.  Blood pressure was  still in need of better control so we resumed the old regimen he was  taking before; 10 mg on Norvasc and 25 mg of hydrochlorothiazide.  __________  said he was in full contact with reality.  There were no  active suicidal or  homicidal ideas, no hallucinations, no delusions.  He  was motivated to pursue outpatient treatment and work for long-term  sobriety.   DISCHARGE DIAGNOSES:  AXIS I:  Alcohol dependence, depressive disorder  not otherwise specified.  AXIS II:  No diagnosis.  AXIS III:  Hypertension, arthritis.  AXIS IV:  Moderate.  AXIS V:  Upon discharge 55, 60.   Discharged on Norvasc 10 mg per day, hydrochlorothiazide 25 mg per day.  Follow-up by [facility] and [facility].      Geoffery Lyons, M.D.  Electronically Signed     IL/MEDQ  D:  06/19/2007  T:  06/20/2007  Job:  [id number]

## 2010-08-20 ENCOUNTER — Emergency Department (HOSPITAL_COMMUNITY)
Admission: EM | Admit: 2010-08-20 | Discharge: 2010-08-20 | Disposition: A | Discharge status: Home / Self Care | Payer: Self-pay | Attending: Emergency Medicine | Admitting: Emergency Medicine

## 2010-08-20 DIAGNOSIS — I10 Essential (primary) hypertension: Secondary | ICD-10-CM | POA: Insufficient documentation

## 2010-08-20 DIAGNOSIS — E876 Hypokalemia: Secondary | ICD-10-CM | POA: Insufficient documentation

## 2010-08-20 DIAGNOSIS — F1010 Alcohol abuse, uncomplicated: Secondary | ICD-10-CM | POA: Insufficient documentation

## 2010-08-20 DIAGNOSIS — IMO0002 Reserved for concepts with insufficient information to code with codable children: Secondary | ICD-10-CM | POA: Insufficient documentation

## 2010-08-20 LAB — DIFFERENTIAL
Lymphs Abs: 2 10*3/uL (ref 0.7–4.0)
Monocytes Relative: 9 % (ref 3–12)
Neutro Abs: 2.4 10*3/uL (ref 1.7–7.7)
Neutrophils Relative %: 48 % (ref 43–77)

## 2010-08-20 LAB — CBC
Hemoglobin: 13.1 g/dL (ref 13.0–17.0)
MCH: 29.4 pg (ref 26.0–34.0)
MCV: 84.3 fL (ref 78.0–100.0)
RBC: 4.46 MIL/uL (ref 4.22–5.81)
WBC: 5 10*3/uL (ref 4.0–10.5)

## 2010-08-20 LAB — COMPREHENSIVE METABOLIC PANEL
Albumin: 4.1 g/dL (ref 3.5–5.2)
BUN: 8 mg/dL (ref 6–23)
Calcium: 8.7 mg/dL (ref 8.4–10.5)
Creatinine, Ser: 1 mg/dL (ref 0.4–1.5)
Potassium: 3.3 mEq/L — ABNORMAL LOW (ref 3.5–5.1)
Total Protein: 7.4 g/dL (ref 6.0–8.3)

## 2010-08-20 LAB — ETHANOL: Alcohol, Ethyl (B): 198 mg/dL — ABNORMAL HIGH (ref 0–10)

## 2010-10-04 ENCOUNTER — Telehealth: Payer: Self-pay | Admitting: Family Medicine

## 2010-10-04 ENCOUNTER — Other Ambulatory Visit: Payer: Self-pay | Admitting: Family Medicine

## 2010-10-04 DIAGNOSIS — I10 Essential (primary) hypertension: Secondary | ICD-10-CM

## 2010-10-04 MED ORDER — AMLODIPINE BESYLATE 5 MG PO TABS: 5.0000 mg | ORAL_TABLET | Freq: Every day | ORAL | Status: DC

## 2010-10-04 NOTE — Telephone Encounter (Signed)
Opened in error

## 2010-12-09 LAB — POCT CARDIAC MARKERS
CKMB, poc: <1 — ABNORMAL LOW
CKMB, poc: <1 — ABNORMAL LOW
Myoglobin, poc: 32.6
Myoglobin, poc: 49.9
Operator id: 284251
Operator id: 284251
Troponin i, poc: <0.05

## 2010-12-09 LAB — CBC
HCT: 45.7
Hemoglobin: 15.6
MCHC: 34.1
RBC: 5.16
RDW: 14.4

## 2010-12-09 LAB — I-STAT 8, (EC8 V) (CONVERTED LAB)
BUN: 7
Bicarbonate: 23.6
Chloride: 111
Glucose, Bld: 89
HCT: 49
Hemoglobin: 16.7
Operator id: 284251
Sodium: 141

## 2010-12-09 LAB — DIFFERENTIAL
Basophils Absolute: 0.1
Eosinophils Relative: 1
Lymphocytes Relative: 26
Monocytes Absolute: 0.4
Monocytes Relative: 7

## 2010-12-09 LAB — RAPID URINE DRUG SCREEN, HOSP PERFORMED
Amphetamines: NOT DETECTED
Opiates: NOT DETECTED
Tetrahydrocannabinol: NOT DETECTED

## 2010-12-09 LAB — CARDIAC PANEL(CRET KIN+CKTOT+MB+TROPI): Total CK: 53

## 2010-12-09 LAB — ETHANOL: Alcohol, Ethyl (B): 83 — ABNORMAL HIGH

## 2010-12-14 LAB — HEPATIC FUNCTION PANEL
ALT: 15
AST: 21
Albumin: 3.5
Bilirubin, Direct: 0.1
Total Protein: 6.6

## 2010-12-14 LAB — BASIC METABOLIC PANEL
BUN: 13
Creatinine, Ser: 1.03
GFR calc non Af Amer: >60
Glucose, Bld: 94
Potassium: 3.7

## 2010-12-14 LAB — URINALYSIS, ROUTINE W REFLEX MICROSCOPIC
Bilirubin Urine: NEGATIVE
Glucose, UA: NEGATIVE
Hgb urine dipstick: NEGATIVE
Protein, ur: NEGATIVE
Urobilinogen, UA: 0.2

## 2010-12-14 LAB — CBC
HCT: 42.1
HCT: 37.7 — ABNORMAL LOW
Hemoglobin: 14.7
MCHC: 34.8
MCV: 87.2
MCV: 88.8
Platelets: 225
RBC: 4.82
RDW: 14.6
WBC: 7.6

## 2010-12-14 LAB — DIFFERENTIAL
Basophils Absolute: 0
Basophils Relative: 1
Eosinophils Absolute: 0.1
Eosinophils Absolute: 0
Eosinophils Relative: 1
Eosinophils Relative: 0
Lymphocytes Relative: 26
Monocytes Absolute: 0.4
Monocytes Relative: 7
Neutrophils Relative %: 67

## 2010-12-14 LAB — RAPID URINE DRUG SCREEN, HOSP PERFORMED
Amphetamines: NOT DETECTED
Benzodiazepines: POSITIVE — AB
Benzodiazepines: NOT DETECTED
Cocaine: NOT DETECTED
Opiates: NOT DETECTED
Tetrahydrocannabinol: NOT DETECTED
Tetrahydrocannabinol: NOT DETECTED

## 2010-12-14 LAB — POCT I-STAT, CHEM 8
Calcium, Ion: 1.12
Chloride: 104
Chloride: 108
Glucose, Bld: 87
HCT: 46
HCT: 42
Hemoglobin: 15.6
Potassium: 3.9
Potassium: 3.8

## 2010-12-14 LAB — ETHANOL: Alcohol, Ethyl (B): 181 — ABNORMAL HIGH

## 2010-12-14 LAB — POCT CARDIAC MARKERS: CKMB, poc: <1 — ABNORMAL LOW

## 2010-12-15 LAB — URINE DRUGS OF ABUSE SCREEN W ALC, ROUTINE (REF LAB)
Barbiturate Quant, Ur: NEGATIVE
Barbiturate Quant, Ur: NEGATIVE
Barbiturate Quant, Ur: NEGATIVE
Benzodiazepines.: NEGATIVE
Benzodiazepines.: NEGATIVE
Benzodiazepines.: NEGATIVE
Cocaine Metabolites: NEGATIVE
Cocaine Metabolites: NEGATIVE
Creatinine,U: 150
Creatinine,U: 159.1
Creatinine,U: 187.1
Ethyl Alcohol: <10
Ethyl Alcohol: <10
Ethyl Alcohol: <10
Opiate Screen, Urine: NEGATIVE
Opiate Screen, Urine: NEGATIVE
Opiate Screen, Urine: NEGATIVE
Phencyclidine (PCP): NEGATIVE
Phencyclidine (PCP): NEGATIVE
Phencyclidine (PCP): NEGATIVE

## 2010-12-16 ENCOUNTER — Other Ambulatory Visit: Payer: Self-pay | Admitting: Family Medicine

## 2010-12-16 LAB — URINE DRUGS OF ABUSE SCREEN W ALC, ROUTINE (REF LAB)
Barbiturate Quant, Ur: NEGATIVE
Barbiturate Quant, Ur: NEGATIVE
Barbiturate Quant, Ur: NEGATIVE
Benzodiazepines.: NEGATIVE
Benzodiazepines.: NEGATIVE
Benzodiazepines.: NEGATIVE
Cocaine Metabolites: NEGATIVE
Cocaine Metabolites: NEGATIVE
Cocaine Metabolites: NEGATIVE
Creatinine,U: 165.9
Creatinine,U: 80.2
Creatinine,U: 80.4
Ethyl Alcohol: <10
Ethyl Alcohol: <10
Ethyl Alcohol: <10
Opiate Screen, Urine: NEGATIVE
Phencyclidine (PCP): NEGATIVE
Phencyclidine (PCP): NEGATIVE
Phencyclidine (PCP): NEGATIVE

## 2010-12-16 MED ORDER — SIMVASTATIN 10 MG PO TABS: 10.0000 mg | ORAL_TABLET | Freq: Every day | ORAL | Status: DC

## 2010-12-22 LAB — HEPATIC FUNCTION PANEL
ALT: 19 U/L (ref 0–53)
AST: 25 U/L (ref 0–37)
Alkaline Phosphatase: 82 U/L (ref 39–117)
Bilirubin, Direct: 0.1 mg/dL (ref 0.0–0.3)

## 2010-12-22 LAB — RAPID URINE DRUG SCREEN, HOSP PERFORMED
Benzodiazepines: NOT DETECTED
Cocaine: NOT DETECTED
Tetrahydrocannabinol: NOT DETECTED

## 2010-12-22 LAB — COMPREHENSIVE METABOLIC PANEL
AST: 22 U/L (ref 0–37)
Albumin: 4.3 g/dL (ref 3.5–5.2)
Calcium: 8.9 mg/dL (ref 8.4–10.5)
Creatinine, Ser: 1.2 mg/dL (ref 0.4–1.5)
GFR calc Af Amer: >60 mL/min (ref >60)
GFR calc non Af Amer: >60 mL/min (ref >60)
Sodium: 139 mEq/L (ref 135–145)
Total Protein: 7.3 g/dL (ref 6.0–8.3)

## 2010-12-22 LAB — URINALYSIS, ROUTINE W REFLEX MICROSCOPIC
Bilirubin Urine: NEGATIVE
Glucose, UA: NEGATIVE mg/dL
Ketones, ur: NEGATIVE mg/dL
Protein, ur: NEGATIVE mg/dL
pH: 6 (ref 5.0–8.0)

## 2010-12-22 LAB — TRICYCLICS SCREEN, URINE: TCA Scrn: NOT DETECTED

## 2010-12-22 LAB — DIFFERENTIAL
Eosinophils Relative: 0 % (ref 0–5)
Lymphocytes Relative: 25 % (ref 12–46)
Lymphs Abs: 2.2 10*3/uL (ref 0.7–4.0)
Monocytes Absolute: 0.5 10*3/uL (ref 0.1–1.0)
Monocytes Relative: 6 % (ref 3–12)

## 2010-12-22 LAB — CBC
MCHC: 33.4 g/dL (ref 30.0–36.0)
MCV: 89.2 fL (ref 78.0–100.0)
Platelets: 261 10*3/uL (ref 150–400)
RDW: 14.9 % (ref 11.5–15.5)

## 2010-12-22 LAB — ACETAMINOPHEN LEVEL: Acetaminophen (Tylenol), Serum: <10 ug/mL — ABNORMAL LOW (ref 10–30)

## 2010-12-22 LAB — GLUCOSE, CAPILLARY: Glucose-Capillary: 97 mg/dL (ref 70–99)

## 2010-12-30 LAB — HEPATIC FUNCTION PANEL
ALT: 16
AST: 19
Albumin: 3.7
Alkaline Phosphatase: 90
Indirect Bilirubin: 0.6
Total Protein: 6.5

## 2010-12-30 LAB — BASIC METABOLIC PANEL
CO2: 24
Calcium: 8.7
Creatinine, Ser: 1.03
Glucose, Bld: 80

## 2010-12-30 LAB — CBC
MCHC: 34.5
MCV: 87
Platelets: 256

## 2010-12-30 LAB — DIFFERENTIAL
Basophils Relative: 1
Eosinophils Absolute: 0.1
Eosinophils Relative: 2
Monocytes Relative: 7
Neutrophils Relative %: 58

## 2010-12-30 LAB — RAPID URINE DRUG SCREEN, HOSP PERFORMED
Amphetamines: NOT DETECTED
Benzodiazepines: NOT DETECTED
Cocaine: NOT DETECTED
Opiates: NOT DETECTED
Tetrahydrocannabinol: NOT DETECTED

## 2010-12-30 LAB — ETHANOL: Alcohol, Ethyl (B): <5

## 2011-03-25 ENCOUNTER — Encounter: Payer: Self-pay | Admitting: Emergency Medicine

## 2011-03-25 ENCOUNTER — Emergency Department (HOSPITAL_COMMUNITY)
Admission: EM | Admit: 2011-03-25 | Discharge: 2011-03-26 | Disposition: A | Discharge status: Home / Self Care | Payer: Self-pay | Attending: Emergency Medicine | Admitting: Emergency Medicine

## 2011-03-25 DIAGNOSIS — E119 Type 2 diabetes mellitus without complications: Secondary | ICD-10-CM | POA: Insufficient documentation

## 2011-03-25 DIAGNOSIS — F603 Borderline personality disorder: Secondary | ICD-10-CM | POA: Insufficient documentation

## 2011-03-25 DIAGNOSIS — Z79899 Other long term (current) drug therapy: Secondary | ICD-10-CM | POA: Insufficient documentation

## 2011-03-25 DIAGNOSIS — Z7982 Long term (current) use of aspirin: Secondary | ICD-10-CM | POA: Insufficient documentation

## 2011-03-25 DIAGNOSIS — F1010 Alcohol abuse, uncomplicated: Secondary | ICD-10-CM | POA: Insufficient documentation

## 2011-03-25 DIAGNOSIS — F10929 Alcohol use, unspecified with intoxication, unspecified: Secondary | ICD-10-CM

## 2011-03-25 DIAGNOSIS — I10 Essential (primary) hypertension: Secondary | ICD-10-CM | POA: Insufficient documentation

## 2011-03-25 HISTORY — DX: Essential (primary) hypertension: I10

## 2011-03-25 LAB — BASIC METABOLIC PANEL
CO2: 23 mEq/L (ref 19–32)
Calcium: 9 mg/dL (ref 8.4–10.5)
Creatinine, Ser: 1.07 mg/dL (ref 0.50–1.35)
GFR calc Af Amer: 85 mL/min — ABNORMAL LOW (ref >90)
GFR calc non Af Amer: 73 mL/min — ABNORMAL LOW (ref >90)
Sodium: 137 mEq/L (ref 135–145)

## 2011-03-25 LAB — DIFFERENTIAL
Basophils Absolute: 0 10*3/uL (ref 0.0–0.1)
Basophils Relative: 0 % (ref 0–1)
Eosinophils Relative: 2 % (ref 0–5)
Lymphocytes Relative: 39 % (ref 12–46)

## 2011-03-25 LAB — CBC
MCHC: 34.9 g/dL (ref 30.0–36.0)
MCV: 85.8 fL (ref 78.0–100.0)
Platelets: 220 10*3/uL (ref 150–400)
RDW: 14.2 % (ref 11.5–15.5)
WBC: 5.3 10*3/uL (ref 4.0–10.5)

## 2011-03-25 LAB — ETHANOL: Alcohol, Ethyl (B): 268 mg/dL — ABNORMAL HIGH (ref 0–11)

## 2011-03-25 LAB — RAPID URINE DRUG SCREEN, HOSP PERFORMED
Amphetamines: NOT DETECTED
Opiates: NOT DETECTED

## 2011-03-25 MED ORDER — SODIUM CHLORIDE 0.9 % IV BOLUS (SEPSIS)
1000.0000 mL | Freq: Once | INTRAVENOUS | Status: AC
  Administered 2011-03-25: 1000 mL via INTRAVENOUS

## 2011-03-25 NOTE — ED Notes (Signed)
Pt found crawling on the ground,strong smell of etoh. Combative pta and on arrival to hosp .

## 2011-03-25 NOTE — ED Notes (Signed)
Pt taken out of restraints. Now resting non combative,cooperative.md aware.

## 2011-03-25 NOTE — ED Provider Notes (Signed)
History     CSN: [id number]  Arrival date & time 03/25/11  2032   First MD Initiated Contact with Patient 03/25/11 2056      Chief Complaint  Patient presents with  . Alcohol Intoxication  . Aggressive Behavior    (Consider location/radiation/quality/duration/timing/severity/associated sxs/prior treatment) HPI.   Level V caveat for intoxication. Patient brought in by [facility] Department secondary to intoxication.  He was found crawling [place].  Police officers felt he had been drinking alcohol.  Patient was uncooperative with police. No gross neuro deficits   Past Medical History  Diagnosis Date  . Diabetes mellitus   . Hypertension     History reviewed. No pertinent past surgical history.  No family history on file.  History  Substance Use Topics  . Smoking status: Not on file  . Smokeless tobacco: Not on file  . Alcohol Use:       Review of Systems  Unable to perform ROS: Other    Allergies  Penicillins  Home Medications   Current Outpatient Rx  Name Route Sig Dispense Refill  . AMLODIPINE BESYLATE 5 MG PO TABS Oral Take 5 mg by mouth daily.      . ASPIRIN 81 MG PO TBEC Oral Take 81 mg by mouth daily.      Marland Kitchen LISINOPRIL-HYDROCHLOROTHIAZIDE 20-25 MG PO TABS Oral Take 1 tablet by mouth daily.      Marland Kitchen ONE-DAILY MULTI VITAMINS PO TABS Oral Take 1 tablet by mouth daily.      Marland Kitchen SIMVASTATIN 10 MG PO TABS Oral Take 10 mg by mouth daily.        BP 170/104  Pulse 78  Resp 19  SpO2 100%  Physical Exam  Nursing note and vitals reviewed. Constitutional: He appears well-developed and well-nourished.       Appears intoxicated, restless, uncooperative  HENT:  Head: Normocephalic and atraumatic.       No head trauma  Eyes: Conjunctivae and EOM are normal. Pupils are equal, round, and reactive to light.  Neck: Normal range of motion. Neck supple.  Cardiovascular: Normal rate and regular rhythm.   Pulmonary/Chest: Effort  normal and breath sounds normal.  Abdominal: Soft. Bowel sounds are normal. Rebound: no head trauma.  Musculoskeletal: Normal range of motion.  Neurological:       Moving all extremities  Skin: Skin is warm and dry.  Psychiatric:       Intoxicated    ED Course  Procedures (including critical care time)  Labs Reviewed  BASIC METABOLIC PANEL - Abnormal; Notable for the following:    GFR calc non Af Amer 73 (*)    GFR calc Af Amer 85 (*)    All other components within normal limits  ETHANOL - Abnormal; Notable for the following:    Alcohol, Ethyl (B) 268 (*)    All other components within normal limits  CBC  DIFFERENTIAL  URINE RAPID DRUG SCREEN (HOSP PERFORMED)   No results found.   No diagnosis found. CRITICAL CARE Performed by: Donnetta Hutching   Total critical care time: 30  Critical care time was exclusive of separately billable procedures and treating other patients.  Critical care was necessary to treat or prevent imminent or life-threatening deterioration.  Critical care was time spent personally by me on the following activities: development of treatment plan with patient and/or surrogate as well as nursing, discussions with consultants, evaluation of patient's response to treatment, examination of patient, obtaining history from  patient or surrogate, ordering and performing treatments and interventions, ordering and review of laboratory studies, ordering and review of radiographic studies, pulse oximetry and re-evaluation of patient's condition.   MDM  Suspect alcohol is a major problem. We'll restrain patient initially for his safety  Recheck at 1120. Much more pleasant. Cooperative. More alert.  Moving all extremities. No evidence of a neurological deficit.      Donnetta Hutching, MD 03/25/11 [phone]

## 2011-03-26 ENCOUNTER — Emergency Department (HOSPITAL_COMMUNITY): Payer: Self-pay

## 2011-03-26 LAB — AMMONIA: Ammonia: 49 umol/L (ref 11–60)

## 2011-03-26 LAB — GLUCOSE, CAPILLARY: Glucose-Capillary: 94 mg/dL (ref 70–99)

## 2011-03-26 MED ORDER — LORAZEPAM 2 MG/ML IJ SOLN
1.0000 mg | Freq: Once | INTRAMUSCULAR | Status: AC
  Administered 2011-03-26: 1 mg via INTRAVENOUS

## 2011-03-26 MED ORDER — LORAZEPAM 2 MG/ML IJ SOLN
1.0000 mg | Freq: Once | INTRAMUSCULAR | Status: AC
  Administered 2011-03-26: 1 mg via INTRAVENOUS
  Filled 2011-03-26: qty 1

## 2011-03-26 NOTE — ED Provider Notes (Addendum)
  Physical Exam  BP 170/104  Pulse 78  Resp 19  SpO2 100%  Physical Exam  ED Course  Procedures  MDM C. patient's in signout from Dr. Adriana Simas. His mental status had improved. Now he's become more confused again. he states he has to work. IV was replaced, and Ativan were given.    9562 Patient is more awake now. Will attempt feeding him.  Juliet Rude. Rubin Payor, MD 03/26/11 1308  1035: Patient is now awake and alert. Family is going to come and pick him up. He will be discharged.  Dione Booze, MD 03/26/11 1039

## 2011-03-26 NOTE — ED Notes (Signed)
Pt resting at this time; sitter at bedside

## 2011-03-26 NOTE — ED Notes (Signed)
Upon arrival of CT tech. Pt became agitated and altered mental status began. 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 Pt unsteady in up right position. /pt repeatedly repeating him self, and uncooperative. EDP informed

## 2011-03-26 NOTE — ED Notes (Signed)
Meal tray ordered from service response center 

## 2011-04-06 ENCOUNTER — Encounter (HOSPITAL_COMMUNITY): Payer: Self-pay | Admitting: Emergency Medicine

## 2011-04-06 ENCOUNTER — Emergency Department (HOSPITAL_COMMUNITY)
Admission: EM | Admit: 2011-04-06 | Discharge: 2011-04-06 | Disposition: A | Discharge status: Home / Self Care | Payer: Self-pay | Attending: Emergency Medicine | Admitting: Emergency Medicine

## 2011-04-06 DIAGNOSIS — F419 Anxiety disorder, unspecified: Secondary | ICD-10-CM

## 2011-04-06 DIAGNOSIS — F10929 Alcohol use, unspecified with intoxication, unspecified: Secondary | ICD-10-CM

## 2011-04-06 DIAGNOSIS — E119 Type 2 diabetes mellitus without complications: Secondary | ICD-10-CM | POA: Insufficient documentation

## 2011-04-06 DIAGNOSIS — I10 Essential (primary) hypertension: Secondary | ICD-10-CM | POA: Insufficient documentation

## 2011-04-06 DIAGNOSIS — Z79899 Other long term (current) drug therapy: Secondary | ICD-10-CM | POA: Insufficient documentation

## 2011-04-06 DIAGNOSIS — F411 Generalized anxiety disorder: Secondary | ICD-10-CM | POA: Insufficient documentation

## 2011-04-06 DIAGNOSIS — Z7982 Long term (current) use of aspirin: Secondary | ICD-10-CM | POA: Insufficient documentation

## 2011-04-06 LAB — CBC
Hemoglobin: 13.2 g/dL (ref 13.0–17.0)
Platelets: 249 10*3/uL (ref 150–400)
RBC: 4.39 MIL/uL (ref 4.22–5.81)

## 2011-04-06 LAB — RAPID URINE DRUG SCREEN, HOSP PERFORMED
Amphetamines: NOT DETECTED
Benzodiazepines: NOT DETECTED
Cocaine: NOT DETECTED
Opiates: NOT DETECTED

## 2011-04-06 LAB — COMPREHENSIVE METABOLIC PANEL
BUN: 15 mg/dL (ref 6–23)
CO2: 20 mEq/L (ref 19–32)
Calcium: 8.7 mg/dL (ref 8.4–10.5)
Creatinine, Ser: 1.17 mg/dL (ref 0.50–1.35)
GFR calc Af Amer: 76 mL/min — ABNORMAL LOW (ref >90)
GFR calc non Af Amer: 66 mL/min — ABNORMAL LOW (ref >90)
Glucose, Bld: 89 mg/dL (ref 70–99)

## 2011-04-06 MED ORDER — LORAZEPAM 2 MG/ML IJ SOLN
1.0000 mg | Freq: Once | INTRAMUSCULAR | Status: AC
  Administered 2011-04-06: 1 mg via INTRAVENOUS
  Filled 2011-04-06: qty 1

## 2011-04-06 MED ORDER — SODIUM CHLORIDE 0.9 % IV BOLUS (SEPSIS)
500.0000 mL | Freq: Once | INTRAVENOUS | Status: AC
  Administered 2011-04-06: 500 mL via INTRAVENOUS

## 2011-04-06 NOTE — ED Notes (Signed)
Patient denies pain and is resting comfortably.  

## 2011-04-06 NOTE — ED Notes (Signed)
Patient is resting comfortably. 

## 2011-04-06 NOTE — ED Notes (Signed)
[id number]<AV> Expected date:04/06/11<BR> Expected time:10:46 AM<BR> Means of arrival:Ambulance<BR> Comments:<BR> Anxiety/hypertensive

## 2011-04-06 NOTE — ED Provider Notes (Signed)
History     CSN: [id number]  Arrival date & time 04/06/11  1103   First MD Initiated Contact with Patient 04/06/11 1105      Chief Complaint  Patient presents with  . Anxiety  . Hypertension    (Consider location/radiation/quality/duration/timing/severity/associated sxs/prior treatment) Patient is a 61 y.o. male presenting with anxiety and hypertension. The history is provided by the patient and the EMS personnel. The history is limited by the condition of the patient (level 5 caveat, pt unresponsive verbally).  Anxiety  Hypertension  pt w hx etoh abuse, presents via ems. Caregiver had called indicating pt very anxious, altered ms. ?report of pt recently receiving some bad news. Pt will answer occasional question yes/no, however largely not cooperative with history. Level 5 caveat.   Past Medical History  Diagnosis Date  . Diabetes mellitus   . Hypertension     No past surgical history on file.  No family history on file.  History  Substance Use Topics  . Smoking status: Not on file  . Smokeless tobacco: Not on file  . Alcohol Use:       Review of Systems  Unable to perform ROS: Psychiatric disorder  level 5 caveat  Allergies  Penicillins  Home Medications   Current Outpatient Rx  Name Route Sig Dispense Refill  . AMLODIPINE BESYLATE 5 MG PO TABS Oral Take 5 mg by mouth daily.      . ASPIRIN 81 MG PO TBEC Oral Take 81 mg by mouth daily.      Marland Kitchen LISINOPRIL-HYDROCHLOROTHIAZIDE 20-25 MG PO TABS Oral Take 1 tablet by mouth daily.      Marland Kitchen ONE-DAILY MULTI VITAMINS PO TABS Oral Take 1 tablet by mouth daily.      Marland Kitchen SIMVASTATIN 10 MG PO TABS Oral Take 10 mg by mouth daily.        SpO2 98%  Physical Exam  Nursing note and vitals reviewed. Constitutional: He appears well-developed.  HENT:  Head: Atraumatic.  Eyes: EOM are normal. Pupils are equal, round, and reactive to light.  Neck: Normal range of motion. Neck supple. No tracheal deviation present.       No  stiffness or rigidity  Cardiovascular: Normal rate, regular rhythm, normal heart sounds and intact distal pulses.   Pulmonary/Chest: Effort normal and breath sounds normal. No accessory muscle usage. No respiratory distress.  Abdominal: Soft. Bowel sounds are normal. He exhibits no distension. There is no tenderness.  Musculoskeletal: Normal range of motion. He exhibits no edema and no tenderness.  Neurological: He is alert.       Pt alert, anxious, crying. Purposefully moving bil extremities, ambulates w steady gait. Not cooperative w focused neuro exam.   Skin: Skin is warm and dry.  Psychiatric:       Anxious, tearful.     ED Course  Procedures (including critical care time)   Labs Reviewed  URINE RAPID DRUG SCREEN (HOSP PERFORMED)  ETHANOL  COMPREHENSIVE METABOLIC PANEL  CBC   Results for orders placed during the hospital encounter of 04/06/11  ETHANOL      Component Value Range   Alcohol, Ethyl (B) 121 (*) 0 - 11 (mg/dL)  COMPREHENSIVE METABOLIC PANEL      Component Value Range   Sodium 135  135 - 145 (mEq/L)   Potassium 3.6  3.5 - 5.1 (mEq/L)   Chloride 96  96 - 112 (mEq/L)   CO2 20  19 - 32 (mEq/L)   Glucose, Bld 89  70 -  99 (mg/dL)   BUN 15  6 - 23 (mg/dL)   Creatinine, Ser 4.54  0.50 - 1.35 (mg/dL)   Calcium 8.7  8.4 - 09.8 (mg/dL)   Total Protein 7.5  6.0 - 8.3 (g/dL)   Albumin 4.2  3.5 - 5.2 (g/dL)   AST 54 (*) 0 - 37 (U/L)   ALT 30  0 - 53 (U/L)   Alkaline Phosphatase 111  39 - 117 (U/L)   Total Bilirubin 0.4  0.3 - 1.2 (mg/dL)   GFR calc non Af Amer 66 (*) >90 (mL/min)   GFR calc Af Amer 76 (*) >90 (mL/min)  CBC      Component Value Range   WBC 5.6  4.0 - 10.5 (K/uL)   RBC 4.39  4.22 - 5.81 (MIL/uL)   Hemoglobin 13.2  13.0 - 17.0 (g/dL)   HCT 11.9 (*) 14.7 - 52.0 (%)   MCV 84.3  78.0 - 100.0 (fL)   MCH 30.1  26.0 - 34.0 (pg)   MCHC 35.7  30.0 - 36.0 (g/dL)   RDW 82.9  56.2 - 13.0 (%)   Platelets 249  150 - 400 (K/uL)   Ct Head Wo  Contrast  03/26/2011  *RADIOLOGY REPORT*  Clinical Data: Confusion, EtOH.  CT HEAD WITHOUT CONTRAST  Technique:  Contiguous axial images were obtained from the base of the skull through the vertex without contrast.  Comparison: 11/30/2009  Findings: Prominence of the sulci, cisterns, and ventricles, in keeping with volume loss. There are subcortical and periventricular white matter hypodensities, a nonspecific finding most often seen with chronic microangiopathic changes.  There is no evidence for acute hemorrhage, overt hydrocephalus, mass lesion, or abnormal extra-axial fluid collection.  No definite CT evidence for acute cortical based (large artery) infarction. More focal hypodensity within the right centrum semiovale valley is unchanged and may represent white matter changes as well or a remote lacunar infarction. The visualized paranasal sinuses and mastoid air cells are predominately clear.  IMPRESSION: White matter changes.  No acute intracranial abnormality identified.  Original Report Authenticated By: Waneta Martins, M.D.       MDM  Iv ns. Pt very anxious, tearful. Pt reassured. Remains anxious. Ativan 1 mg iv.   Reviewed nursing notes and prior charts.  Recent ed visit re etoh abuse, etoh intoxication, anxiety.  Given meal, fluids. Ambulated.  Recheck pt content, alert, no tremor or shakes. No longer anxious/tearful.  Pt declines etoh rehab or detox.          Suzi Roots, MD 04/06/11 [phone]

## 2011-04-06 NOTE — ED Notes (Signed)
Vital signs stable. 

## 2011-04-06 NOTE — ED Notes (Signed)
MD at bedside. 

## 2011-04-06 NOTE — ED Notes (Signed)
-    PT AMBULATED IN HALF- LENGTH OF HALL WAY WITH ASSISTANCE FROM MYSELF.   TOLERATED WELL.

## 2011-04-06 NOTE — ED Notes (Signed)
-    PT'S CARETAKER STATED THAT PT WAS ACTING ANXIOUS.     SHE CALLED EMS AND THOUGHT PT WAS HAVING ANOTHER CVA.      PT NOW APPEARS RESTING COMFY.       NO OBVIOUS EXTREMITY COMPROMISE OR FACIAL DROOPING.     PT STATED THAT HE REC'D BAD NEWS THIS A.M. WHICH STARTED THIS "DRAMA".    PT DID NOT TAKE HIS B/P MED THIS A.M.

## 2012-03-17 ENCOUNTER — Encounter (HOSPITAL_COMMUNITY): Payer: Self-pay | Admitting: Physical Medicine and Rehabilitation

## 2012-03-17 ENCOUNTER — Observation Stay (HOSPITAL_COMMUNITY)
Admission: EM | Admit: 2012-03-17 | Discharge: 2012-03-19 | Disposition: A | Discharge status: Home / Self Care | Payer: Self-pay | Attending: Cardiology | Admitting: Cardiology

## 2012-03-17 ENCOUNTER — Emergency Department (HOSPITAL_COMMUNITY): Payer: Self-pay

## 2012-03-17 DIAGNOSIS — F3289 Other specified depressive episodes: Secondary | ICD-10-CM | POA: Insufficient documentation

## 2012-03-17 DIAGNOSIS — R079 Chest pain, unspecified: Principal | ICD-10-CM | POA: Insufficient documentation

## 2012-03-17 DIAGNOSIS — I10 Essential (primary) hypertension: Secondary | ICD-10-CM | POA: Insufficient documentation

## 2012-03-17 DIAGNOSIS — E7800 Pure hypercholesterolemia, unspecified: Secondary | ICD-10-CM | POA: Insufficient documentation

## 2012-03-17 DIAGNOSIS — F329 Major depressive disorder, single episode, unspecified: Secondary | ICD-10-CM | POA: Insufficient documentation

## 2012-03-17 DIAGNOSIS — F419 Anxiety disorder, unspecified: Secondary | ICD-10-CM

## 2012-03-17 DIAGNOSIS — I4510 Unspecified right bundle-branch block: Secondary | ICD-10-CM | POA: Insufficient documentation

## 2012-03-17 DIAGNOSIS — E119 Type 2 diabetes mellitus without complications: Secondary | ICD-10-CM | POA: Insufficient documentation

## 2012-03-17 DIAGNOSIS — R61 Generalized hyperhidrosis: Secondary | ICD-10-CM | POA: Insufficient documentation

## 2012-03-17 DIAGNOSIS — I2510 Atherosclerotic heart disease of native coronary artery without angina pectoris: Secondary | ICD-10-CM | POA: Insufficient documentation

## 2012-03-17 DIAGNOSIS — K219 Gastro-esophageal reflux disease without esophagitis: Secondary | ICD-10-CM | POA: Insufficient documentation

## 2012-03-17 DIAGNOSIS — R55 Syncope and collapse: Secondary | ICD-10-CM | POA: Insufficient documentation

## 2012-03-17 LAB — APTT: aPTT: 25 seconds (ref 24–37)

## 2012-03-17 LAB — URINALYSIS, ROUTINE W REFLEX MICROSCOPIC
Glucose, UA: NEGATIVE mg/dL
Ketones, ur: 15 mg/dL — AB
Leukocytes, UA: NEGATIVE
Protein, ur: 30 mg/dL — AB

## 2012-03-17 LAB — COMPREHENSIVE METABOLIC PANEL
ALT: 11 U/L (ref 0–53)
AST: 27 U/L (ref 0–37)
Albumin: 3.7 g/dL (ref 3.5–5.2)
Alkaline Phosphatase: 73 U/L (ref 39–117)
BUN: 17 mg/dL (ref 6–23)
BUN: 16 mg/dL (ref 6–23)
CO2: 22 mEq/L (ref 19–32)
Calcium: 8.8 mg/dL (ref 8.4–10.5)
Chloride: 100 mEq/L (ref 96–112)
Creatinine, Ser: 1.19 mg/dL (ref 0.50–1.35)
GFR calc Af Amer: 80 mL/min — ABNORMAL LOW (ref >90)
GFR calc non Af Amer: 64 mL/min — ABNORMAL LOW (ref >90)
Glucose, Bld: 116 mg/dL — ABNORMAL HIGH (ref 70–99)
Potassium: 4.8 mEq/L (ref 3.5–5.1)
Sodium: 135 mEq/L (ref 135–145)
Total Bilirubin: 0.3 mg/dL (ref 0.3–1.2)
Total Protein: 6.3 g/dL (ref 6.0–8.3)

## 2012-03-17 LAB — RAPID URINE DRUG SCREEN, HOSP PERFORMED
Amphetamines: NOT DETECTED
Barbiturates: NOT DETECTED
Benzodiazepines: NOT DETECTED
Cocaine: NOT DETECTED
Tetrahydrocannabinol: NOT DETECTED

## 2012-03-17 LAB — PROTIME-INR
INR: 1.05 (ref 0.00–1.49)
Prothrombin Time: 13.6 seconds (ref 11.6–15.2)

## 2012-03-17 LAB — CBC WITH DIFFERENTIAL/PLATELET
Basophils Absolute: 0 10*3/uL (ref 0.0–0.1)
Basophils Relative: 0 % (ref 0–1)
Eosinophils Absolute: 0 10*3/uL (ref 0.0–0.7)
Hemoglobin: 12.2 g/dL — ABNORMAL LOW (ref 13.0–17.0)
MCH: 28.5 pg (ref 26.0–34.0)
MCHC: 33.6 g/dL (ref 30.0–36.0)
Monocytes Relative: 5 % (ref 3–12)
Neutro Abs: 4.7 10*3/uL (ref 1.7–7.7)
Neutrophils Relative %: 66 % (ref 43–77)
RDW: 14.2 % (ref 11.5–15.5)

## 2012-03-17 LAB — CBC
HCT: 40.2 % (ref 39.0–52.0)
MCV: 84.3 fL (ref 78.0–100.0)
Platelets: 221 10*3/uL (ref 150–400)
RBC: 4.77 MIL/uL (ref 4.22–5.81)
WBC: 8.1 10*3/uL (ref 4.0–10.5)

## 2012-03-17 LAB — TROPONIN I: Troponin I: <0.3 ng/mL (ref <0.30)

## 2012-03-17 LAB — URINE MICROSCOPIC-ADD ON

## 2012-03-17 LAB — MAGNESIUM: Magnesium: 2 mg/dL (ref 1.5–2.5)

## 2012-03-17 MED ORDER — ATORVASTATIN CALCIUM 80 MG PO TABS
80.0000 mg | ORAL_TABLET | Freq: Every day | ORAL | Status: DC
  Administered 2012-03-17 – 2012-03-18 (×2): 80 mg via ORAL
  Filled 2012-03-17 (×3): qty 1

## 2012-03-17 MED ORDER — ASPIRIN 300 MG RE SUPP
300.0000 mg | RECTAL | Status: AC
  Filled 2012-03-17: qty 1

## 2012-03-17 MED ORDER — METOPROLOL TARTRATE 12.5 MG HALF TABLET
12.5000 mg | ORAL_TABLET | Freq: Two times a day (BID) | ORAL | Status: DC
  Administered 2012-03-17 – 2012-03-19 (×5): 12.5 mg via ORAL
  Filled 2012-03-17 (×5): qty 1

## 2012-03-17 MED ORDER — NITROGLYCERIN 0.4 MG SL SUBL: 0.4000 mg | SUBLINGUAL_TABLET | SUBLINGUAL | Status: DC | PRN

## 2012-03-17 MED ORDER — SODIUM CHLORIDE 0.9 % IV SOLN
INTRAVENOUS | Status: DC
  Administered 2012-03-17: 13:00:00 via INTRAVENOUS

## 2012-03-17 MED ORDER — HEPARIN BOLUS VIA INFUSION
4000.0000 [IU] | Freq: Once | INTRAVENOUS | Status: AC
  Administered 2012-03-17: 4000 [IU] via INTRAVENOUS
  Filled 2012-03-17: qty 4000

## 2012-03-17 MED ORDER — ASPIRIN EC 81 MG PO TBEC: 81.0000 mg | DELAYED_RELEASE_TABLET | Freq: Every day | ORAL | Status: DC

## 2012-03-17 MED ORDER — ONDANSETRON HCL 4 MG/2ML IJ SOLN: 4.0000 mg | Freq: Four times a day (QID) | INTRAMUSCULAR | Status: DC | PRN

## 2012-03-17 MED ORDER — NITROGLYCERIN IN D5W 200-5 MCG/ML-% IV SOLN
5.0000 ug/min | INTRAVENOUS | Status: DC
  Administered 2012-03-17: 5 ug/min via INTRAVENOUS
  Filled 2012-03-17: qty 250

## 2012-03-17 MED ORDER — ASPIRIN EC 81 MG PO TBEC
81.0000 mg | DELAYED_RELEASE_TABLET | Freq: Every day | ORAL | Status: DC
  Administered 2012-03-18 – 2012-03-19 (×2): 81 mg via ORAL
  Filled 2012-03-17 (×2): qty 1

## 2012-03-17 MED ORDER — ASPIRIN 81 MG PO TBEC: 81.0000 mg | DELAYED_RELEASE_TABLET | Freq: Every day | ORAL | Status: DC

## 2012-03-17 MED ORDER — ASPIRIN 81 MG PO CHEW: 324.0000 mg | CHEWABLE_TABLET | ORAL | Status: AC

## 2012-03-17 MED ORDER — HEPARIN (PORCINE) IN NACL 100-0.45 UNIT/ML-% IJ SOLN
950.0000 [IU]/h | INTRAMUSCULAR | Status: DC
  Administered 2012-03-17: 950 [IU]/h via INTRAVENOUS
  Filled 2012-03-17: qty 250

## 2012-03-17 MED ORDER — LORAZEPAM 2 MG/ML IJ SOLN
1.0000 mg | Freq: Once | INTRAMUSCULAR | Status: AC
  Administered 2012-03-17: 1 mg via INTRAVENOUS
  Filled 2012-03-17: qty 1

## 2012-03-17 MED ORDER — PANTOPRAZOLE SODIUM 40 MG PO TBEC
40.0000 mg | DELAYED_RELEASE_TABLET | Freq: Every day | ORAL | Status: DC
  Administered 2012-03-17 – 2012-03-19 (×3): 40 mg via ORAL
  Filled 2012-03-17 (×2): qty 1

## 2012-03-17 MED ORDER — ACETAMINOPHEN 325 MG PO TABS: 650.0000 mg | ORAL_TABLET | ORAL | Status: DC | PRN

## 2012-03-17 NOTE — H&P (Signed)
Tyrone Noble is an 61 y.o. male.   Chief Complaint: Chest pain HPI: Patient is 61 year old male with past medical history significant for hypertension, and GERD, hypercholesteremia, depression, history of vocal abuse, history of tobacco abuse in the past, came to the ER by EMS complaining of retrosternal chest pain described as tightness localized associated with mild diaphoresis and near syncopal episode while in the chart. EKG done in the field showed normal sinus rhythm with the right bundle branch block with secondary ST-T wave changes and left anterior fascicular block, which were same as prior her EKG. Patient denies any palpitation lightheadedness or syncopal episode but states felt dizzy. Denies any weakness in the arms or legs. Denies any seizure activity. Denies any shortness of breath. Patient states he used to drink heavy but has been sober for almost one year now. States also used to smoke one pack per day for approximately 10 years quit many years ago. Denies any cardiac workup in the recent past.  Past Medical History  Diagnosis Date  . Diabetes mellitus   . Hypertension     No past surgical history on file.  History reviewed. No pertinent family history. Social History:  reports that he has never smoked. He does not have any smokeless tobacco history on file. He reports that he does not drink alcohol or use illicit drugs.  Allergies:  Allergies  Allergen Reactions  . Penicillins Other (See Comments)    unknown     (Not in a hospital admission)  No results found for this or any previous visit (from the past 48 hour(s)). No results found.  Review of Systems  Constitutional: Negative for fever and chills.  Eyes: Negative for blurred vision and double vision.  Respiratory: Negative for cough, hemoptysis and sputum production.   Cardiovascular: Positive for chest pain. Negative for palpitations, orthopnea, claudication and leg swelling.  Gastrointestinal: Negative  for nausea, vomiting and abdominal pain.  Neurological: Positive for dizziness and tremors. Negative for sensory change, speech change, focal weakness, seizures, loss of consciousness and headaches.    Blood pressure 119/81, pulse 87, temperature 98.4 F (36.9 C), temperature source Oral, resp. rate 18, SpO2 100.00%. Physical Exam  Constitutional: He is oriented to person, place, and time.  HENT:  Head: Normocephalic and atraumatic.  Nose: Nose normal.  Mouth/Throat: No oropharyngeal exudate.  Eyes: Conjunctivae normal are normal. Pupils are equal, round, and reactive to light. Left eye exhibits no discharge. No scleral icterus.  Neck: Normal range of motion. Neck supple. No JVD present. No tracheal deviation present. No thyromegaly present.  Cardiovascular: Normal rate and regular rhythm.   Murmur (soft systolic murmur noted no S3 gallop) heard. Respiratory: Effort normal and breath sounds normal. No respiratory distress. He has no wheezes. He has no rales.  GI: Soft. Bowel sounds are normal. He exhibits no distension. There is no tenderness. There is no rebound and no guarding.  Musculoskeletal: He exhibits no edema and no tenderness.  Neurological: He is alert and oriented to person, place, and time.     Assessment/Plan Chest pain rule out MI  status post near-syncope rule out cardiac arrhythmias Hypertension Right bundle branch block GERD Depression History of alcohol and tobacco abuse Plan As per orders Schedule for nuclear stress test in a.m. Discussed with patient and family various options of treatment and agreed for noninvasive stress testing first [facility] 03/17/2012, 1:47 PM

## 2012-03-17 NOTE — ED Notes (Signed)
Code STEMI cancelled per Dr. Denton Lank.

## 2012-03-17 NOTE — Progress Notes (Signed)
03/17/12 1900  Clinical Encounter Type  Visited With Family  Visit Type ED   Chaplain paged for a code-stemi patient. Assisted family members in ED to see the patient. Code-stemi was canceled shortly after. Veryl Speak

## 2012-03-17 NOTE — ED Provider Notes (Signed)
History     CSN: [id number]  Arrival date & time 03/17/12  1309   First MD Initiated Contact with Patient 03/17/12 1310      Chief Complaint  Patient presents with  . Code STEMI    (Consider location/radiation/quality/duration/timing/severity/associated sxs/prior treatment) The history is provided by the patient and the EMS personnel.  pt was at church, began to feel lightheaded/faint. Also noted onset mid chest pain, dull, mild non radiating, constant. Denies sob, nv or diaphoresis. Had to lie down, ?momentary loc. No trauma or injury. Denies palpitations or sense of irregular, fast, or slow heart beat. ems was called and brought to ED. Denies other recent chest pain. No hx cad. Denies family hx cad. Non smoker. Denies any recent med change or new meds. No cough or uri c/o. No fever or chills. No pleuritic pain. No leg pain or swelling.      Past Medical History  Diagnosis Date  . Diabetes mellitus   . Hypertension     No past surgical history on file.  History reviewed. No pertinent family history.  History  Substance Use Topics  . Smoking status: Never Smoker   . Smokeless tobacco: Not on file  . Alcohol Use: No      Review of Systems  Constitutional: Negative for fever and chills.  HENT: Negative for neck pain.   Eyes: Negative for redness.  Respiratory: Negative for shortness of breath.   Cardiovascular: Positive for chest pain. Negative for palpitations and leg swelling.  Gastrointestinal: Negative for vomiting, abdominal pain, diarrhea and blood in stool.  Genitourinary: Negative for flank pain.  Musculoskeletal: Negative for back pain.  Skin: Negative for rash.  Neurological: Positive for light-headedness. Negative for headaches.  Hematological: Does not bruise/bleed easily.  Psychiatric/Behavioral: Negative for confusion.    Allergies  Penicillins  Home Medications   Current Outpatient Rx  Name  Route  Sig  Dispense  Refill  . ASPIRIN 81 MG PO  TBEC   Oral   Take 81 mg by mouth daily.           . ATENOLOL-CHLORTHALIDONE 50-25 MG PO TABS   Oral   Take 1 tablet by mouth every morning.         Marland Kitchen SIMVASTATIN 10 MG PO TABS   Oral   Take 10 mg by mouth daily.             BP 119/81  Pulse 87  Temp 98.4 F (36.9 C) (Oral)  Resp 18  SpO2 100%  Physical Exam  Nursing note and vitals reviewed. Constitutional: He is oriented to person, place, and time. He appears well-developed and well-nourished.       Pt anxious, tremulous.  HENT:  Head: Atraumatic.  Nose: Nose normal.  Mouth/Throat: Oropharynx is clear and moist.  Eyes: Conjunctivae normal are normal. Pupils are equal, round, and reactive to light. No scleral icterus.  Neck: Neck supple. No tracheal deviation present. No thyromegaly present.       No bruit  Cardiovascular: Normal rate, regular rhythm, normal heart sounds and intact distal pulses.   Pulmonary/Chest: Effort normal and breath sounds normal. No accessory muscle usage. No respiratory distress. He exhibits no tenderness.  Abdominal: Soft. Bowel sounds are normal. He exhibits no distension and no mass. There is no tenderness. There is no rebound and no guarding.  Musculoskeletal: Normal range of motion. He exhibits no edema and no tenderness.  Neurological: He is alert and oriented to person, place, and time.  Motor intact bil.   Skin: Skin is warm and dry. He is not diaphoretic.  Psychiatric:       Very anxious, shaky     ED Course  Procedures (including critical care time)   Labs Reviewed  CBC  COMPREHENSIVE METABOLIC PANEL  TROPONIN I  URINALYSIS, ROUTINE W REFLEX MICROSCOPIC   Results for orders placed during the hospital encounter of 03/17/12  CBC      Component Value Range   WBC 8.1  4.0 - 10.5 K/uL   RBC 4.77  4.22 - 5.81 MIL/uL   Hemoglobin 14.0  13.0 - 17.0 g/dL   HCT 16.1  09.6 - 04.5 %   MCV 84.3  78.0 - 100.0 fL   MCH 29.4  26.0 - 34.0 pg   MCHC 34.8  30.0 - 36.0 g/dL    RDW 40.9  81.1 - 91.4 %   Platelets 221  150 - 400 K/uL   Dg Chest Port 1 View  03/17/2012  *RADIOLOGY REPORT*  Clinical Data: Short of breath, chest pain  PORTABLE CHEST - 1 VIEW  Comparison: None.  Findings: Normal mediastinum and cardiac silhouette.  Normal pulmonary  vasculature.  No evidence of effusion, infiltrate, or pneumothorax.  No acute bony abnormality.  IMPRESSION: Normal chest radiograph   Original Report Authenticated By: Genevive Bi, M.D.        MDM  Iv ns. Labs.  Ecg. Cxr.   Pt appears very anxious, hx same. Given ativan 1 mg iv.   Date: 03/17/2012  Rate: 99  Rhythm: normal sinus rhythm  QRS Axis: left  Intervals: normal  ST/T Wave abnormalities: nonspecific ST/T changes  Conduction Disutrbances:right bundle branch block and left anterior fascicular block  Narrative Interpretation:   Old EKG Reviewed: unchanged  Dr Sharyn Lull, cardiology on call, is seeing pt in ed, he plans to admit.  Recheck pt, calm, alert, no pain.         Suzi Roots, MD 03/17/12 [phone]

## 2012-03-17 NOTE — ED Notes (Signed)
Pt presents to department via [facility] for evaluation of STEMI. Had syncopal episode in church, then began having midsternal non radiating chest pain. Received 324 ASA and 2 sublingual nitroglycerin per EMS. Pt states 3/10 pain upon arrival, pt also states SOB. He is alert and oriented x4. CBG 112. 20g R forearm.

## 2012-03-17 NOTE — ED Notes (Signed)
Floor RN unable to take report at the time. To call back.

## 2012-03-17 NOTE — Progress Notes (Signed)
ANTICOAGULATION CONSULT NOTE - Initial Consult  Pharmacy Consult for heparin Indication: chest pain/ACS  Allergies  Allergen Reactions  . Penicillins Other (See Comments)    unknown    Patient Measurements:   Heparin Dosing Weight:   Vital Signs: Temp: 98.4 F (36.9 C) (12/29 1327) Temp src: Oral (12/29 1327) BP: 137/65 mmHg (12/29 1507) Pulse Rate: 69  (12/29 1507)  Labs:  Basename 03/17/12 1339  HGB 14.0  HCT 40.2  PLT 221  APTT --  LABPROT --  INR --  HEPARINUNFRC --  CREATININE 1.19  CKTOTAL --  CKMB --  TROPONINI <0.30    The CrCl is unknown because both a height and weight (above a minimum accepted value) are required for this calculation.   Medical History: Past Medical History  Diagnosis Date  . Diabetes mellitus   . Hypertension     Medications:  Scheduled:    . aspirin  324 mg Oral NOW   Or  . aspirin  300 mg Rectal NOW  . aspirin EC  81 mg Oral Daily  . aspirin EC  81 mg Oral Daily  . atorvastatin  80 mg Oral q1800  . [COMPLETED] LORazepam  1 mg Intravenous Once  . metoprolol tartrate  12.5 mg Oral BID  . pantoprazole  40 mg Oral Q0600  . [DISCONTINUED] aspirin  81 mg Oral Daily    Assessment: 62 yo without hx of CAD who presented to the ED with CP. IV heparin has been ordered to r/o MI.   Goal of Therapy:  Heparin level 0.3-0.7 units/ml Monitor platelets by anticoagulation protocol: Yes   Plan:  Heparin bolus 4000 units x1 Heparin drip at 950 units/hr F/u with 6hr heparin level  Ulyses Southward [place] 03/17/2012,5:32 PM

## 2012-03-18 ENCOUNTER — Other Ambulatory Visit: Payer: Self-pay

## 2012-03-18 ENCOUNTER — Observation Stay (HOSPITAL_COMMUNITY): Payer: Self-pay

## 2012-03-18 LAB — HEPARIN LEVEL (UNFRACTIONATED): Heparin Unfractionated: 0.63 IU/mL (ref 0.30–0.70)

## 2012-03-18 LAB — TROPONIN I: Troponin I: <0.3 ng/mL (ref <0.30)

## 2012-03-18 LAB — BASIC METABOLIC PANEL
CO2: 26 mEq/L (ref 19–32)
Glucose, Bld: 97 mg/dL (ref 70–99)
Potassium: 3.8 mEq/L (ref 3.5–5.1)
Sodium: 139 mEq/L (ref 135–145)

## 2012-03-18 LAB — CBC
HCT: 35.3 % — ABNORMAL LOW (ref 39.0–52.0)
Hemoglobin: 11.8 g/dL — ABNORMAL LOW (ref 13.0–17.0)
RBC: 4.16 MIL/uL — ABNORMAL LOW (ref 4.22–5.81)

## 2012-03-18 LAB — LIPID PANEL
Cholesterol: 186 mg/dL (ref 0–200)
HDL: 72 mg/dL (ref >39)
LDL Cholesterol: 105 mg/dL — ABNORMAL HIGH (ref 0–99)
Triglycerides: 43 mg/dL (ref <150)
VLDL: 9 mg/dL (ref 0–40)

## 2012-03-18 MED ORDER — ASPIRIN 81 MG PO CHEW
324.0000 mg | CHEWABLE_TABLET | ORAL | Status: AC
  Administered 2012-03-19: 324 mg via ORAL
  Filled 2012-03-18: qty 4

## 2012-03-18 MED ORDER — DIAZEPAM 5 MG PO TABS
5.0000 mg | ORAL_TABLET | ORAL | Status: AC
  Administered 2012-03-19: 5 mg via ORAL
  Filled 2012-03-18: qty 1

## 2012-03-18 MED ORDER — SODIUM CHLORIDE 0.9 % IV SOLN: 250.0000 mL | INTRAVENOUS | Status: DC | PRN

## 2012-03-18 MED ORDER — TECHNETIUM TC 99M SESTAMIBI GENERIC - CARDIOLITE
10.0000 | Freq: Once | INTRAVENOUS | Status: AC | PRN
  Administered 2012-03-18: 10 via INTRAVENOUS

## 2012-03-18 MED ORDER — SODIUM CHLORIDE 0.9 % IJ SOLN: 3.0000 mL | INTRAMUSCULAR | Status: DC | PRN

## 2012-03-18 MED ORDER — REGADENOSON 0.4 MG/5ML IV SOLN
0.4000 mg | Freq: Once | INTRAVENOUS | Status: AC
  Administered 2012-03-18: 0.4 mg via INTRAVENOUS
  Filled 2012-03-18: qty 5

## 2012-03-18 MED ORDER — SODIUM CHLORIDE 0.9 % IJ SOLN: 3.0000 mL | Freq: Two times a day (BID) | INTRAMUSCULAR | Status: DC

## 2012-03-18 MED ORDER — SODIUM CHLORIDE 0.9 % IV SOLN: 1.0000 mL/kg/h | INTRAVENOUS | Status: DC

## 2012-03-18 MED ORDER — HEPARIN (PORCINE) IN NACL 100-0.45 UNIT/ML-% IJ SOLN
750.0000 [IU]/h | INTRAMUSCULAR | Status: DC
  Administered 2012-03-19: 750 [IU]/h via INTRAVENOUS
  Filled 2012-03-18 (×3): qty 250

## 2012-03-18 MED ORDER — CLOPIDOGREL BISULFATE 75 MG PO TABS
300.0000 mg | ORAL_TABLET | Freq: Once | ORAL | Status: AC
  Administered 2012-03-18: 300 mg via ORAL
  Filled 2012-03-18: qty 4

## 2012-03-18 MED ORDER — TECHNETIUM TC 99M SESTAMIBI GENERIC - CARDIOLITE
30.0000 | Freq: Once | INTRAVENOUS | Status: AC | PRN
  Administered 2012-03-18: 30 via INTRAVENOUS

## 2012-03-18 NOTE — Progress Notes (Signed)
Subjective:  Patient denies any chest pain or shortness of breath. No further episodes of near syncope. Had a nuclear stress test today result still pending  Objective:  Vital Signs in the last 24 hours: Temp:  [97.6 F (36.4 C)-98.4 F (36.9 C)] 97.8 F (36.6 C) (12/30 0600) Pulse Rate:  [68-87] 68  (12/30 0600) Resp:  [12-18] 16  (12/30 0600) BP: (119-157)/(65-104) 142/78 mmHg (12/30 1040) SpO2:  [100 %] 100 % (12/30 0600) Weight:  [64.32 kg (141 lb 12.8 oz)-68.04 kg (150 lb)] 64.32 kg (141 lb 12.8 oz) (12/30 0600)  Intake/Output from previous day: 12/29 0701 - 12/30 0700 In: 114.1 [I.V.:114.1] Out: 525 [Urine:525] Intake/Output from this shift: Total I/O In: 360 [P.O.:360] Out: -   Physical Exam: Neck: no adenopathy, no carotid bruit, no JVD and supple, symmetrical, trachea midline Lungs: clear to auscultation bilaterally Heart: regular rate and rhythm, S1, S2 normal and Soft systolic murmur noted Abdomen: soft, non-tender; bowel sounds normal; no masses,  no organomegaly Extremities: extremities normal, atraumatic, no cyanosis or edema  Lab Results:  Basename 03/18/12 0530 03/17/12 1755  WBC 6.0 7.1  HGB 11.8* 12.2*  PLT 216 204    Basename 03/18/12 0530 03/17/12 1755  NA 139 135  K 3.8 3.7  CL 105 101  CO2 26 24  GLUCOSE 97 116*  BUN 15 16  CREATININE 1.04 1.12    Basename 03/18/12 0530 03/17/12 2331  TROPONINI <0.30 <0.30   Hepatic Function Panel  Basename 03/17/12 1755  PROT 6.3  ALBUMIN 3.7  AST 15  ALT 11  ALKPHOS 70  BILITOT 0.3  BILIDIR --  IBILI --    Basename 03/18/12 0530  CHOL 186   No results found for this basename: PROTIME in the last 72 hours  Imaging: Imaging results have been reviewed and Dg Chest Port 1 View  03/17/2012  *RADIOLOGY REPORT*  Clinical Data: Short of breath, chest pain  PORTABLE CHEST - 1 VIEW  Comparison: None.  Findings: Normal mediastinum and cardiac silhouette.  Normal pulmonary  vasculature.  No evidence  of effusion, infiltrate, or pneumothorax.  No acute bony abnormality.  IMPRESSION: Normal chest radiograph   Original Report Authenticated By: Genevive Bi, M.D.     Cardiac Studies:  Assessment/Plan:  Status post Chest pain MI ruled out status post near-syncope rule out cardiac arrhythmias  Hypertension  Right bundle branch block  GERD  Depression  History of alcohol and tobacco abuse  Plan Continue present management Will DC home health nuclear stress test negative for ischemia  LOS: 1 day    Melani Brisbane N 03/18/2012, 1:02 PM

## 2012-03-18 NOTE — Progress Notes (Signed)
  Echocardiogram 2D Echocardiogram has been performed.  Lynnix Schoneman 03/18/2012, 1:23 PM

## 2012-03-18 NOTE — Progress Notes (Signed)
ANTICOAGULATION CONSULT NOTE - Follow Up Consult  Pharmacy Consult for heparin Indication: chest pain/ACS  Allergies  Allergen Reactions  . Penicillins Other (See Comments)    unknown    Patient Measurements: Height: 5\' 7"  (170.2 cm) Weight: 141 lb 12.8 oz (64.32 kg) IBW/kg (Calculated) : 66.1  Heparin Dosing Weight: 68 kg   Vital Signs: Temp: 97.6 F (36.4 C) (12/30 1400) Temp src: Oral (12/30 1400) BP: 148/66 mmHg (12/30 1421) Pulse Rate: 61  (12/30 1421)  Labs:  Basename 03/18/12 1542 03/18/12 0530 03/18/12 03/17/12 2331 03/17/12 1755 03/17/12 1339  HGB -- 11.8* -- -- 12.2* --  HCT -- 35.3* -- -- 36.3* 40.2  PLT -- 216 -- -- 204 221  APTT -- -- -- -- 25 --  LABPROT -- -- -- -- 13.6 --  INR -- -- -- -- 1.05 --  HEPARINUNFRC 0.63 -- 1.09* -- -- --  CREATININE -- 1.04 -- -- 1.12 1.19  CKTOTAL -- -- -- -- -- --  CKMB -- -- -- -- -- --  TROPONINI -- <0.30 -- <0.30 <0.30 --    Estimated Creatinine Clearance: 67 ml/min (by C-G formula based on Cr of 1.04).   Medical History: Past Medical History  Diagnosis Date  . Diabetes mellitus   . Hypertension     Medications:  Scheduled:     . aspirin  324 mg Oral NOW   Or  . aspirin  300 mg Rectal NOW  . aspirin EC  81 mg Oral Daily  . atorvastatin  80 mg Oral q1800  . [COMPLETED] heparin  4,000 Units Intravenous Once  . metoprolol tartrate  12.5 mg Oral BID  . pantoprazole  40 mg Oral Q1200  . [COMPLETED] regadenoson  0.4 mg Intravenous Once  . [DISCONTINUED] aspirin  81 mg Oral Daily  . [DISCONTINUED] aspirin EC  81 mg Oral Daily    Assessment: 61 yo without hx of CAD who presented to the ED with CP. IV heparin has been ordered to r/o MI.   Heparin level now therapeutic.  Goal of Therapy:  Heparin level 0.3-0.7 units/ml Monitor platelets by anticoagulation protocol: Yes   Plan:  1. Cont heparin drip at 750 units/hr 2. F/u with AM level

## 2012-03-18 NOTE — Progress Notes (Signed)
ANTICOAGULATION CONSULT NOTE - Follow Up Consult  Pharmacy Consult for heparin Indication: chest pain/ACS  Allergies  Allergen Reactions  . Penicillins Other (See Comments)    unknown    Patient Measurements: Height: 5\' 7"  (170.2 cm) Weight: 150 lb (68.04 kg) IBW/kg (Calculated) : 66.1  Heparin Dosing Weight: 68 kg   Vital Signs: Temp: 97.6 F (36.4 C) (12/29 2100) Temp src: Oral (12/29 1327) BP: 125/75 mmHg (12/29 2100) Pulse Rate: 75  (12/29 2100)  Labs:  Basename 03/18/12 03/17/12 1755 03/17/12 1339  HGB -- 12.2* 14.0  HCT -- 36.3* 40.2  PLT -- 204 221  APTT -- 25 --  LABPROT -- 13.6 --  INR -- 1.05 --  HEPARINUNFRC 1.09* -- --  CREATININE -- 1.12 1.19  CKTOTAL -- -- --  CKMB -- -- --  TROPONINI -- <0.30 <0.30    Estimated Creatinine Clearance: 63.9 ml/min (by C-G formula based on Cr of 1.12).   Medical History: Past Medical History  Diagnosis Date  . Diabetes mellitus   . Hypertension     Medications:  Scheduled:     . aspirin  324 mg Oral NOW   Or  . aspirin  300 mg Rectal NOW  . aspirin EC  81 mg Oral Daily  . atorvastatin  80 mg Oral q1800  . [COMPLETED] heparin  4,000 Units Intravenous Once  . [COMPLETED] LORazepam  1 mg Intravenous Once  . metoprolol tartrate  12.5 mg Oral BID  . pantoprazole  40 mg Oral Q1200  . [DISCONTINUED] aspirin  81 mg Oral Daily  . [DISCONTINUED] aspirin EC  81 mg Oral Daily    Assessment: 61 yo without hx of CAD who presented to the ED with CP. IV heparin has been ordered to r/o MI.   Heparin level (1.09) is above-goal on 950 units/hr. Confirmed with RN that lab drawn appropriately. No bleeding per RN.  Goal of Therapy:  Heparin level 0.3-0.7 units/ml Monitor platelets by anticoagulation protocol: Yes   Plan:  1. Hold heparin x 1 hour, then restart at 750 units/hr.  2. Heparin level 6 hours after restart.  Emeline Gins 03/18/2012,12:47 AM

## 2012-03-18 NOTE — Progress Notes (Signed)
Utilization review completed.  

## 2012-03-19 ENCOUNTER — Encounter (HOSPITAL_COMMUNITY)
Admission: EM | Disposition: A | Discharge status: Home / Self Care | Payer: Self-pay | Source: Home / Self Care | Attending: Emergency Medicine

## 2012-03-19 HISTORY — PX: LEFT HEART CATHETERIZATION WITH CORONARY ANGIOGRAM: SHX5451

## 2012-03-19 LAB — POCT ACTIVATED CLOTTING TIME: Activated Clotting Time: 165 seconds

## 2012-03-19 LAB — CBC
HCT: 36.4 % — ABNORMAL LOW (ref 39.0–52.0)
Hemoglobin: 12.1 g/dL — ABNORMAL LOW (ref 13.0–17.0)
MCHC: 33.2 g/dL (ref 30.0–36.0)
MCV: 84.7 fL (ref 78.0–100.0)
WBC: 5.4 10*3/uL (ref 4.0–10.5)

## 2012-03-19 SURGERY — LEFT HEART CATHETERIZATION WITH CORONARY ANGIOGRAM
Anesthesia: LOCAL

## 2012-03-19 MED ORDER — LIDOCAINE HCL (PF) 1 % IJ SOLN
INTRAMUSCULAR | Status: AC
  Filled 2012-03-19: qty 30

## 2012-03-19 MED ORDER — PANTOPRAZOLE SODIUM 40 MG PO TBEC: 40.0000 mg | DELAYED_RELEASE_TABLET | Freq: Every day | ORAL | Status: DC

## 2012-03-19 MED ORDER — METOPROLOL TARTRATE 12.5 MG HALF TABLET: 25.0000 mg | ORAL_TABLET | Freq: Two times a day (BID) | ORAL | Status: DC

## 2012-03-19 MED ORDER — NITROGLYCERIN 0.2 MG/ML ON CALL CATH LAB
INTRAVENOUS | Status: AC
  Filled 2012-03-19: qty 1

## 2012-03-19 MED ORDER — HEPARIN (PORCINE) IN NACL 2-0.9 UNIT/ML-% IJ SOLN
INTRAMUSCULAR | Status: AC
  Filled 2012-03-19: qty 1000

## 2012-03-19 MED ORDER — ONDANSETRON HCL 4 MG/2ML IJ SOLN: 4.0000 mg | Freq: Four times a day (QID) | INTRAMUSCULAR | Status: DC | PRN

## 2012-03-19 MED ORDER — LISINOPRIL 10 MG PO TABS: 10.0000 mg | ORAL_TABLET | Freq: Every day | ORAL | Status: DC

## 2012-03-19 MED ORDER — ACETAMINOPHEN 325 MG PO TABS: 650.0000 mg | ORAL_TABLET | ORAL | Status: DC | PRN

## 2012-03-19 MED ORDER — MIDAZOLAM HCL 2 MG/2ML IJ SOLN
INTRAMUSCULAR | Status: AC
  Filled 2012-03-19: qty 2

## 2012-03-19 MED ORDER — FENTANYL CITRATE 0.05 MG/ML IJ SOLN
INTRAMUSCULAR | Status: AC
  Filled 2012-03-19: qty 2

## 2012-03-19 MED ORDER — SODIUM CHLORIDE 0.9 % IV SOLN
INTRAVENOUS | Status: DC
  Administered 2012-03-19: 10:00:00 via INTRAVENOUS

## 2012-03-19 MED ORDER — OXYCODONE-ACETAMINOPHEN 5-325 MG PO TABS: 1.0000 | ORAL_TABLET | ORAL | Status: DC | PRN

## 2012-03-19 MED ORDER — SIMVASTATIN 40 MG PO TABS: 40.0000 mg | ORAL_TABLET | Freq: Every day | ORAL | Status: DC

## 2012-03-19 NOTE — Cardiovascular Report (Signed)
NAME:  Tyrone Noble, Tyrone Noble            ACCOUNT NO.:  [id number]  MEDICAL RECORD NO.:  [id number]  LOCATION:  [facility]                         FACILITY:  [facility]  PHYSICIAN:  Jennipher Weatherholtz N. Tyrone Noble, M.D. DATE OF BIRTH:  [birth year]  DATE OF PROCEDURE:  03/19/2012 DATE OF DISCHARGE:                           CARDIAC CATHETERIZATION   PROCEDURE:  Left cardiac cath with selective left and right coronary angiography, left ventriculography via right groin using Judkins technique.  INDICATION FOR THE PROCEDURE:  Tyrone Noble is a 61 year old male with past medical history significant for hypertension, GERD, hypercholesteremia, depression, history of alcohol abuse, history of tobacco abuse in the past.  He came to the ER by EMS complaining of retrosternal chest pain described as tightness, localized associated with mild diaphoresis and near syncopal episode while in the church. EKG done in the field showed normal sinus rhythm with right bundle- branch block with secondary ST-T wave changes and a left anterior fascicular block which was same as prior EKG.  The patient denies any palpitation, lightheadedness, or syncopal episode but states felt dizzy. Denies any weakness in the arms or legs.  Denies any seizure activity. Denies any shortness of breath.  States he used to drink heavily but has been sober for almost 1 year now.  States he used to smoke 1 pack per day for approximately 10 years, quit many years ago.  Denies any cardiac workup in the past.  The patient was admitted to telemetry unit.  MI was ruled out by serial enzymes and EKG.  Subsequently, the patient underwent nuclear stress test which showed mild ischemia in the anteroapical segment suspicious for inducible ischemia with EF of 59%. Due to chest pain and mildly abnormal stress test and multiple risk factors, discussed with the patient and family regarding left cath, possible PTCA stenting, its risks and benefits, i.e., death, MI,  stroke, need for emergency CABG, local vascular complications, etc. and consented for percutaneous coronary intervention.  PROCEDURE:  After obtaining the informed consent, the patient was brought to the cath lab and was placed on fluoroscopy table.  Right groin was prepped and draped in usual fashion.  Xylocaine 1% was used for local anesthesia in the right groin.  With the help of thin wall needle, a 5-French arterial sheath was placed.  The sheath was aspirated and flushed.  Next, a 5-French left Judkins catheter was advanced over the wire under fluoroscopic guidance up to the ascending aorta.  Wire was pulled out.  The catheter was aspirated and connected to the Manifold.  Catheter was further advanced and engaged into left coronary ostium.  Multiple views of the left system were taken.  Next, the catheter was disengaged and was pulled out over the wire and was replaced with 5-French right Judkins catheter, which was advanced over the wire under fluoroscopic guidance up to the ascending aorta.  Wire was pulled out.  The catheter was aspirated and connected to the Manifold.  Catheter was further advanced and engaged into right coronary ostium.  Multiple views of the right system were taken.  Next, catheter was disengaged and was pulled out over the wire and was replaced with 5- French pigtail catheter which was advanced over the wire  under fluoroscopic guidance up to the ascending aorta.  Wire was pulled out. The catheter was aspirated and connected to the Manifold.  Catheter was further advanced across the aortic valve into the LV.  LV pressures were recorded.  Next, LV graphy was done in 30-degree RAO position.  Post- angiographic pressures were recorded from LV and then pullback pressures were recorded from the aorta.  There was no gradient across the aortic valve.  Next, the pigtail catheter was pulled out over the wire. Sheaths were aspirated and flushed.  FINDINGS:  LV showed  good LV systolic function.  EF of 55-60%.  Left main was patent.  LAD has 10-15% ostial and 30-40% mid stenosis. Diagonal 1 has 20-30% proximal stenosis.  Diagonal 2 and 3 were very small which were patent.  Ramus was very small.  Left circumflex was patent.  OM 1 and OM 2 were patent.  RCA has 10-15% proximal stenosis.  PDA and PLV branches were small which were patent.  The patient tolerated the procedure well. There were no complications.  The patient was transferred to recovery room in stable condition.     Tyrone Noble. Tyrone Noble, M.D.     MNH/MEDQ  D:  03/19/2012  T:  03/19/2012  Job:  [id number]

## 2012-03-19 NOTE — CV Procedure (Signed)
Cardiac cath report dictated on 03/19/2012 dictation number is [phone]

## 2012-03-19 NOTE — H&P (Signed)
No changes in H and P.

## 2012-03-19 NOTE — Discharge Summary (Signed)
  Discharge summary dictated on 12/31 2013 dictation number is [phone]

## 2012-03-19 NOTE — Progress Notes (Signed)
Pts ambulated for 2 hrs and groin site stable. Assessment unchanged from this am. D/C instructions given, no further questions. D/c'd via wheelchair to private vehicle

## 2012-03-20 NOTE — Discharge Summary (Signed)
NAMEJILBERTO, Tyrone Noble            ACCOUNT NO.:  [id number]  MEDICAL RECORD NO.:  [id number]  LOCATION:  [id number]                        FACILITY:  [facility]  PHYSICIAN:  Hammad Finkler N. Sharyn Lull, M.D. DATE OF BIRTH:  06/29/1950  DATE OF ADMISSION:  03/17/2012 DATE OF DISCHARGE:  03/19/2012                              DISCHARGE SUMMARY   ADMITTING DIAGNOSES: 1. Chest pain, rule out myocardial infarction, status post near     syncope, rule out cardiac arrhythmias. 2. Hypertension. 3. Right bundle-branch block. 4. Gastroesophageal reflux disease. 5. Depression. 6. History of alcohol and tobacco abuse in the past.  DISCHARGE DIAGNOSES: 1. Status post chest pain, myocardial infarction ruled out, mildly     positive Lexiscan Myoview, status post left catheterization. 2. Mild coronary artery disease. 3. History of near syncope. 4. Hypertension. 5. Right bundle-branch block. 6. Gastroesophageal reflux disease. 7. Depression. 8. History of alcohol and tobacco abuse in the past. 9. Hypercholesteremia.  DISCHARGE HOME MEDICATIONS: 1. Enteric-coated aspirin 81 mg 1 tablet daily. 2. Lopressor 25 mg 1 tablet twice daily. 3. Lisinopril 10 mg 1 tablet daily. 4. Protonix 40 mg 1 tablet daily. 5. Simvastatin 40 mg 1 tablet daily.  DIET:  Low salt, low cholesterol.  ACTIVITY:  Increase activity slowly as tolerated.  Post cardiac cath instructions have been given.  Follow up with me in 1 week.  CONDITION AT DISCHARGE:  Stable.  BRIEF HISTORY AND HOSPITAL COURSE:  [name] is 62 year old male with past medical history significant for hypertension, GERD, hypercholesteremia, depression, history of alcohol abuse, tobacco abuse in the past.  He came to the ER by EMS complaining of retrosternal chest pain, described as tightness, localized, associated with mild diaphoresis and near syncopal episode while in the church.  EKG done on the field, showed normal sinus rhythm with right bundle-branch  block with secondary ST-T wave changes and left anterior fascicular block which were same as prior to EKG.  The patient denies any palpitation, lightheadedness, or syncopal episode, but states felt dizzy.  Denies any weakness in the arms or legs.  Denies any seizure activity.  Denies any shortness of breath.  States used to drink heavy, but has been sober for 1 year now.  States used to smoke for 1 pack per day for approximately 10 years, quit many years ago.  Denies any cardiac workup in the past.  PAST MEDICAL HISTORY:  As above.  PAST SURGICAL HISTORY:  None.  PHYSICAL EXAMINATION:  GENERAL:  He was alert, awake, oriented x3. VITAL SIGNS:  Blood pressure was 119/81, pulse was 87, he was afebrile. HEENT:  Conjunctivae was pink. NECK:  Supple.  No JVD.  No bruit. LUNGS:  Clear to auscultation without rhonchi or rales. CARDIOVASCULAR:  S1, S2 was normal.  There was soft systolic murmur.  No S3 gallop. ABDOMEN:  Soft.  Bowel sounds were present.  Nontender. EXTREMITIES:  There was no clubbing, cyanosis, or edema.  LABORATORY DATA:  His sodium was 134, potassium 4.8, BUN 17, creatinine 1.19, glucose was 103.  Repeat fasting blood sugar yesterday was 97. Four sets of cardiac enzymes were negative.  Cholesterol was 186, triglycerides 43, HDL was 72, LDL was 105.  Hemoglobin was 14,  hematocrit was 40.2, white count was 8.1.  TSH was 1.193.  Urinalysis was negative.  Urine drug screen was negative.  Chest x-ray, no acute cardiopulmonary disease.  BRIEF HOSPITAL COURSE:  The patient was admitted to Telemetry Unit.  MI was ruled out by serial enzymes and EKG.  The patient subsequently underwent Lexiscan Myoview stress test, which showed mildly abnormal dropout of activity in the apical anterior segment on the stress images as compared to rest images suspicious for inducible ischemia.  EF of 59%.  The patient subsequently underwent left cardiac cath with selective left and right coronary  angiography as per procedure report. The patient tolerated the procedure well.  There were no critical lesions, which required PCI.  The patient tolerated the procedure well. Post-procedure, the patient did not have any episodes of chest pain during the hospital stay.  His groin is stable with no evidence of hematoma or bruit.  The patient will be ambulated later this afternoon and will be discharged home if remains stable, and will be followed up in my office in 1 week.     Tyrone Noble. Sharyn Lull, M.D.     MNH/MEDQ  D:  03/19/2012  T:  03/20/2012  Job:  [id number]

## 2014-02-26 ENCOUNTER — Encounter (HOSPITAL_COMMUNITY): Payer: Self-pay | Admitting: Cardiology

## 2014-08-28 ENCOUNTER — Encounter (HOSPITAL_COMMUNITY): Payer: Self-pay | Admitting: Emergency Medicine

## 2014-08-28 ENCOUNTER — Emergency Department (HOSPITAL_COMMUNITY)
Admission: EM | Admit: 2014-08-28 | Discharge: 2014-08-28 | Disposition: A | Discharge status: Home / Self Care | Payer: Commercial Managed Care - HMO | Attending: Emergency Medicine | Admitting: Emergency Medicine

## 2014-08-28 DIAGNOSIS — I10 Essential (primary) hypertension: Secondary | ICD-10-CM | POA: Insufficient documentation

## 2014-08-28 DIAGNOSIS — M25561 Pain in right knee: Secondary | ICD-10-CM

## 2014-08-28 DIAGNOSIS — M25562 Pain in left knee: Secondary | ICD-10-CM | POA: Insufficient documentation

## 2014-08-28 DIAGNOSIS — R21 Rash and other nonspecific skin eruption: Secondary | ICD-10-CM | POA: Diagnosis not present

## 2014-08-28 DIAGNOSIS — Z79899 Other long term (current) drug therapy: Secondary | ICD-10-CM | POA: Insufficient documentation

## 2014-08-28 DIAGNOSIS — Z880 Allergy status to penicillin: Secondary | ICD-10-CM | POA: Insufficient documentation

## 2014-08-28 DIAGNOSIS — Z7982 Long term (current) use of aspirin: Secondary | ICD-10-CM | POA: Insufficient documentation

## 2014-08-28 MED ORDER — METOPROLOL TARTRATE 50 MG PO TABS: 25.0000 mg | ORAL_TABLET | Freq: Two times a day (BID) | ORAL | Status: DC

## 2014-08-28 MED ORDER — NAPROXEN 375 MG PO TABS: 375.0000 mg | ORAL_TABLET | Freq: Two times a day (BID) | ORAL | Status: DC

## 2014-08-28 MED ORDER — OXYCODONE-ACETAMINOPHEN 5-325 MG PO TABS: 2.0000 | ORAL_TABLET | Freq: Four times a day (QID) | ORAL | Status: DC | PRN

## 2014-08-28 MED ORDER — LISINOPRIL 20 MG PO TABS: 20.0000 mg | ORAL_TABLET | Freq: Every day | ORAL | Status: DC

## 2014-08-28 NOTE — ED Provider Notes (Signed)
CSN: [id number]     Arrival date & time 08/28/14  0801 History   First MD Initiated Contact with Patient 08/28/14 0804     Chief Complaint  Patient presents with  . Knee Pain  . Rash     (Consider location/radiation/quality/duration/timing/severity/associated sxs/prior Treatment) Patient is a 64 y.o. male presenting with knee pain and rash. The history is provided by the patient.  Knee Pain Location:  Knee Time since incident:  1 week Injury: no   Knee location:  L knee and R knee Pain details:    Quality:  Aching   Radiates to:  Does not radiate   Severity:  Mild   Onset quality:  Gradual   Duration:  1 week   Timing:  Constant   Progression:  Unchanged Chronicity:  New Dislocation: no   Foreign body present:  No foreign bodies Prior injury to area:  No Relieved by:  Nothing Worsened by:  Activity and bearing weight Ineffective treatments:  None tried Associated symptoms: no fever and no neck pain   Rash Associated symptoms: joint pain   Associated symptoms: no abdominal pain, no diarrhea, no fever, no headaches, no nausea, no shortness of breath and not vomiting     Past Medical History  Diagnosis Date  . Hypertension    Past Surgical History  Procedure Laterality Date  . Left heart catheterization with coronary angiogram N/A 03/19/2012    Procedure: LEFT HEART CATHETERIZATION WITH CORONARY ANGIOGRAM;  Surgeon: Robynn Pane, MD;  Location: [facility] CATH LAB;  Service: Cardiovascular;  Laterality: N/A;   No family history on file. History  Substance Use Topics  . Smoking status: Never Smoker   . Smokeless tobacco: Not on file  . Alcohol Use: No    Review of Systems  Constitutional: Negative for fever.  HENT: Negative for drooling and rhinorrhea.   Eyes: Negative for pain.  Respiratory: Negative for cough and shortness of breath.   Cardiovascular: Negative for chest pain and leg swelling.  Gastrointestinal: Negative for nausea, vomiting, abdominal pain and  diarrhea.  Genitourinary: Negative for dysuria and hematuria.  Musculoskeletal: Positive for arthralgias. Negative for gait problem and neck pain.  Skin: Positive for rash. Negative for color change.  Neurological: Negative for numbness and headaches.  Hematological: Negative for adenopathy.  Psychiatric/Behavioral: Negative for behavioral problems.  All other systems reviewed and are negative.     Allergies  Penicillins  Home Medications   Prior to Admission medications   Medication Sig Start Date End Date Taking? Authorizing Provider  aspirin 81 MG EC tablet Take 81 mg by mouth daily.      Historical Provider, MD  lisinopril (PRINIVIL) 10 MG tablet Take 1 tablet (10 mg total) by mouth daily. 03/19/12   Rinaldo Cloud, MD  metoprolol tartrate (LOPRESSOR) 12.5 mg TABS Take 1 tablet (25 mg total) by mouth 2 (two) times daily. 03/19/12   Rinaldo Cloud, MD  pantoprazole (PROTONIX) 40 MG tablet Take 1 tablet (40 mg total) by mouth daily at 12 noon. 03/19/12   Rinaldo Cloud, MD  simvastatin (ZOCOR) 40 MG tablet Take 1 tablet (40 mg total) by mouth at bedtime. 03/19/12   Rinaldo Cloud, MD   There were no vitals taken for this visit. Physical Exam  Constitutional: He is oriented to person, place, and time. He appears well-developed and well-nourished.  HENT:  Head: Normocephalic and atraumatic.  Right Ear: External ear normal.  Left Ear: External ear normal.  Nose: Nose normal.  Mouth/Throat: Oropharynx is  clear and moist. No oropharyngeal exudate.  Eyes: Conjunctivae and EOM are normal. Pupils are equal, round, and reactive to light.  Neck: Normal range of motion. Neck supple.  Cardiovascular: Normal rate, regular rhythm, normal heart sounds and intact distal pulses.  Exam reveals no gallop and no friction rub.   No murmur heard. Pulmonary/Chest: Effort normal and breath sounds normal. No respiratory distress. He has no wheezes.  Abdominal: Soft. Bowel sounds are normal. He exhibits  no distension. There is no tenderness. There is no rebound and no guarding.  Musculoskeletal: Normal range of motion. He exhibits no edema or tenderness.  Normal range of motion of the knees bilaterally without significant pain. Normal-appearing knees without swelling, erythema, or inflammation.  Normal examination of the digits of the lower extremities.  2+ distal pulses in the distal lower extremities.   Neurological: He is alert and oriented to person, place, and time.  Skin: Skin is warm and dry.  Very few excoriated lesions noted on the lower extremities, upper extremities, and few on the trunk.  Psychiatric: He has a normal mood and affect. His behavior is normal.  Nursing note and vitals reviewed.   ED Course  Procedures (including critical care time) Labs Review Labs Reviewed - No data to display  Imaging Review No results found.   EKG Interpretation None      MDM   Final diagnoses:  Bilateral knee pain  Rash    8:32 AM 64 y.o. male with a history of hypertension, heart cath at the end of 2013 w/ no lesions requiring PCI who presents with bilateral knee pain for the last week. He notes that he has been walking more and been more active in church. He is comfortable at rest but notes bilateral knee pain with ambulation. He denies any fevers. He has noted several excoriated lesions seen very sparingly on his extremities and torso over the last 48 hours. Likely insect bites. He denies any tick exposures. He denies any headaches. He denies any injuries. Knee pain likely due to osteoarthritis as he has a normal exam otherwise. Will recommend Percocet for a short period of time and then a short course of naproxen. Also RICE.   8:36 AM:  I have discussed the diagnosis/risks/treatment options with the patient and believe the pt to be eligible for discharge home to follow-up with his pcp. We also discussed returning to the ED immediately if new or worsening sx occur. We discussed  the sx which are most concerning (e.g., worsening pain, fever, worsening rash) that necessitate immediate return. Medications administered to the patient during their visit and any new prescriptions provided to the patient are listed below.  Medications given during this visit Medications - No data to display  New Prescriptions   NAPROXEN (NAPROSYN) 375 MG TABLET    Take 1 tablet (375 mg total) by mouth 2 (two) times daily.   OXYCODONE-ACETAMINOPHEN (PERCOCET) 5-325 MG PER TABLET    Take 2 tablets by mouth every 6 (six) hours as needed for moderate pain.       Purvis Sheffield, MD 08/28/14 [phone]

## 2014-08-28 NOTE — Discharge Instructions (Signed)

## 2014-08-28 NOTE — ED Notes (Signed)
Per patient states b/l knee pain for a week-rash on both knees, worse on left-states left big toe swollen

## 2014-12-17 DIAGNOSIS — H5203 Hypermetropia, bilateral: Secondary | ICD-10-CM | POA: Diagnosis not present

## 2014-12-17 DIAGNOSIS — H5210 Myopia, unspecified eye: Secondary | ICD-10-CM | POA: Diagnosis not present

## 2014-12-25 DIAGNOSIS — H52209 Unspecified astigmatism, unspecified eye: Secondary | ICD-10-CM | POA: Diagnosis not present

## 2014-12-25 DIAGNOSIS — H524 Presbyopia: Secondary | ICD-10-CM | POA: Diagnosis not present

## 2014-12-25 DIAGNOSIS — Z0100 Encounter for examination of eyes and vision without abnormal findings: Secondary | ICD-10-CM | POA: Diagnosis not present

## 2014-12-25 DIAGNOSIS — H5203 Hypermetropia, bilateral: Secondary | ICD-10-CM | POA: Diagnosis not present

## 2015-01-15 ENCOUNTER — Emergency Department (HOSPITAL_COMMUNITY)
Admission: EM | Admit: 2015-01-15 | Discharge: 2015-01-15 | Disposition: A | Discharge status: Home / Self Care | Payer: Commercial Managed Care - HMO | Attending: Emergency Medicine | Admitting: Emergency Medicine

## 2015-01-15 ENCOUNTER — Emergency Department (HOSPITAL_COMMUNITY): Payer: Commercial Managed Care - HMO

## 2015-01-15 DIAGNOSIS — I10 Essential (primary) hypertension: Secondary | ICD-10-CM | POA: Insufficient documentation

## 2015-01-15 DIAGNOSIS — Z79899 Other long term (current) drug therapy: Secondary | ICD-10-CM | POA: Insufficient documentation

## 2015-01-15 DIAGNOSIS — Z880 Allergy status to penicillin: Secondary | ICD-10-CM | POA: Insufficient documentation

## 2015-01-15 DIAGNOSIS — R079 Chest pain, unspecified: Secondary | ICD-10-CM | POA: Insufficient documentation

## 2015-01-15 DIAGNOSIS — R0789 Other chest pain: Secondary | ICD-10-CM | POA: Diagnosis not present

## 2015-01-15 LAB — COMPREHENSIVE METABOLIC PANEL
ALT: 18 U/L (ref 17–63)
AST: 25 U/L (ref 15–41)
Albumin: 3.9 g/dL (ref 3.5–5.0)
Alkaline Phosphatase: 79 U/L (ref 38–126)
Anion gap: 10 (ref 5–15)
BUN: 8 mg/dL (ref 6–20)
CO2: 21 mmol/L — ABNORMAL LOW (ref 22–32)
Calcium: 9 mg/dL (ref 8.9–10.3)
Chloride: 103 mmol/L (ref 101–111)
Creatinine, Ser: 1.37 mg/dL — ABNORMAL HIGH (ref 0.61–1.24)
GFR calc Af Amer: >60 mL/min (ref >60)
GFR calc non Af Amer: 53 mL/min — ABNORMAL LOW (ref >60)
Glucose, Bld: 151 mg/dL — ABNORMAL HIGH (ref 65–99)
Potassium: 3.8 mmol/L (ref 3.5–5.1)
Sodium: 134 mmol/L — ABNORMAL LOW (ref 135–145)
Total Bilirubin: 0.8 mg/dL (ref 0.3–1.2)
Total Protein: 6.9 g/dL (ref 6.5–8.1)

## 2015-01-15 LAB — CBC
HCT: 42.4 % (ref 39.0–52.0)
Hemoglobin: 14 g/dL (ref 13.0–17.0)
MCH: 28.3 pg (ref 26.0–34.0)
MCHC: 33 g/dL (ref 30.0–36.0)
MCV: 85.8 fL (ref 78.0–100.0)
Platelets: 232 10*3/uL (ref 150–400)
RBC: 4.94 MIL/uL (ref 4.22–5.81)
RDW: 15.3 % (ref 11.5–15.5)
WBC: 6.3 10*3/uL (ref 4.0–10.5)

## 2015-01-15 LAB — TROPONIN I
Troponin I: <0.03 ng/mL (ref <0.031)
Troponin I: <0.03 ng/mL (ref <0.031)

## 2015-01-15 MED ORDER — ONDANSETRON HCL 4 MG/2ML IJ SOLN
4.0000 mg | Freq: Once | INTRAMUSCULAR | Status: AC
  Administered 2015-01-15: 4 mg via INTRAVENOUS
  Filled 2015-01-15: qty 2

## 2015-01-15 MED ORDER — MORPHINE SULFATE (PF) 4 MG/ML IV SOLN
4.0000 mg | Freq: Once | INTRAVENOUS | Status: AC
Start: 2015-01-15 — End: 2015-01-15
  Administered 2015-01-15: 4 mg via INTRAVENOUS
  Filled 2015-01-15: qty 1

## 2015-01-15 NOTE — Discharge Instructions (Signed)
Nonspecific Chest Pain  °Chest pain can be caused by many different conditions. There is always a chance that your pain could be related to something serious, such as a heart attack or a blood clot in your lungs. Chest pain can also be caused by conditions that are not life-threatening. If you have chest pain, it is very important to follow up with your health care provider. °CAUSES  °Chest pain can be caused by: °· Heartburn. °· Pneumonia or bronchitis. °· Anxiety or stress. °· Inflammation around your heart (pericarditis) or lung (pleuritis or pleurisy). °· A blood clot in your lung. °· A collapsed lung (pneumothorax). It can develop suddenly on its own (spontaneous pneumothorax) or from trauma to the chest. °· Shingles infection (varicella-zoster virus). °· Heart attack. °· Damage to the bones, muscles, and cartilage that make up your chest wall. This can include: °¨ Bruised bones due to injury. °¨ Strained muscles or cartilage due to frequent or repeated coughing or overwork. °¨ Fracture to one or more ribs. °¨ Sore cartilage due to inflammation (costochondritis). °RISK FACTORS  °Risk factors for chest pain may include: °· Activities that increase your risk for trauma or injury to your chest. °· Respiratory infections or conditions that cause frequent coughing. °· Medical conditions or overeating that can cause heartburn. °· Heart disease or family history of heart disease. °· Conditions or health behaviors that increase your risk of developing a blood clot. °· Having had chicken pox (varicella zoster). °SIGNS AND SYMPTOMS °Chest pain can feel like: °· Burning or tingling on the surface of your chest or deep in your chest. °· Crushing, pressure, aching, or squeezing pain. °· Dull or sharp pain that is worse when you move, cough, or take a deep breath. °· Pain that is also felt in your back, neck, shoulder, or arm, or pain that spreads to any of these areas. °Your chest pain may come and go, or it may stay  constant. °DIAGNOSIS °Lab tests or other studies may be needed to find the cause of your pain. Your health care provider may have you take a test called an ambulatory ECG (electrocardiogram). An ECG records your heartbeat patterns at the time the test is performed. You may also have other tests, such as: °· Transthoracic echocardiogram (TTE). During echocardiography, sound waves are used to create a picture of all of the heart structures and to look at how blood flows through your heart. °· Transesophageal echocardiogram (TEE). This is a more advanced imaging test that obtains images from inside your body. It allows your health care provider to see your heart in finer detail. °· Cardiac monitoring. This allows your health care provider to monitor your heart rate and rhythm in real time. °· Holter monitor. This is a portable device that records your heartbeat and can help to diagnose abnormal heartbeats. It allows your health care provider to track your heart activity for several days, if needed. °· Stress tests. These can be done through exercise or by taking medicine that makes your heart beat more quickly. °· Blood tests. °· Imaging tests. °TREATMENT  °Your treatment depends on what is causing your chest pain. Treatment may include: °· Medicines. These may include: °¨ Acid blockers for heartburn. °¨ Anti-inflammatory medicine. °¨ Pain medicine for inflammatory conditions. °¨ Antibiotic medicine, if an infection is present. °¨ Medicines to dissolve blood clots. °¨ Medicines to treat coronary artery disease. °· Supportive care for conditions that do not require medicines. This may include: °¨ Resting. °¨ Applying heat   or cold packs to injured areas. °¨ Limiting activities until pain decreases. °HOME CARE INSTRUCTIONS °· If you were prescribed an antibiotic medicine, finish it all even if you start to feel better. °· Avoid any activities that bring on chest pain. °· Do not use any tobacco products, including  cigarettes, chewing tobacco, or electronic cigarettes. If you need help quitting, ask your health care provider. °· Do not drink alcohol. °· Take medicines only as directed by your health care provider. °· Keep all follow-up visits as directed by your health care provider. This is important. This includes any further testing if your chest pain does not go away. °· If heartburn is the cause for your chest pain, you may be told to keep your head raised (elevated) while sleeping. This reduces the chance that acid will go from your stomach into your esophagus. °· Make lifestyle changes as directed by your health care provider. These may include: °¨ Getting regular exercise. Ask your health care provider to suggest some activities that are safe for you. °¨ Eating a heart-healthy diet. A registered dietitian can help you to learn healthy eating options. °¨ Maintaining a healthy weight. °¨ Managing diabetes, if necessary. °¨ Reducing stress. °SEEK MEDICAL CARE IF: °· Your chest pain does not go away after treatment. °· You have a rash with blisters on your chest. °· You have a fever. °SEEK IMMEDIATE MEDICAL CARE IF:  °· Your chest pain is worse. °· You have an increasing cough, or you cough up blood. °· You have severe abdominal pain. °· You have severe weakness. °· You faint. °· You have chills. °· You have sudden, unexplained chest discomfort. °· You have sudden, unexplained discomfort in your arms, back, neck, or jaw. °· You have shortness of breath at any time. °· You suddenly start to sweat, or your skin gets clammy. °· You feel nauseous or you vomit. °· You suddenly feel light-headed or dizzy. °· Your heart begins to beat quickly, or it feels like it is skipping beats. °These symptoms may represent a serious problem that is an emergency. Do not wait to see if the symptoms will go away. Get medical help right away. Call your local emergency services (911 in the [place] Do not drive yourself to the hospital. °  °This  information is not intended to replace advice given to you by your health care provider. Make sure you discuss any questions you have with your health care provider. °  °Document Released: 12/14/2004 Document Revised: 03/27/2014 Document Reviewed: 10/10/2013 °Elsevier Interactive Patient Education ©2016 Elsevier Inc. ° ° °Emergency Department Resource Guide °1) Find a Doctor and Pay Out of Pocket °Although you won't have to find out who is covered by your insurance plan, it is a good idea to ask around and get recommendations. You will then need to call the office and see if the doctor you have chosen will accept you as a new patient and what types of options they offer for patients who are self-pay. Some doctors offer discounts or will set up payment plans for their patients who do not have insurance, but you will need to ask so you aren't surprised when you get to your appointment. ° °2) Contact Your Local Health Department °Not all health departments have doctors that can see patients for sick visits, but many do, so it is worth a call to see if yours does. If you don't know where your local health department is, you can check in your phone book.   The CDC also has a tool to help you locate your state's health department, and many state websites also have listings of all of their local health departments. ° °3) Find a Walk-in Clinic °If your illness is not likely to be very severe or complicated, you may want to try a walk in clinic. These are popping up all over the country in pharmacies, drugstores, and shopping centers. They're usually staffed by nurse practitioners or physician assistants that have been trained to treat common illnesses and complaints. They're usually fairly quick and inexpensive. However, if you have serious medical issues or chronic medical problems, these are probably not your best option. ° °No Primary Care Doctor: °- Call [facility] at  832-8000 - they can help you locate a primary  care doctor that  accepts your insurance, provides certain services, etc. °- Physician Referral Service- 1-800-533-3463 ° °Chronic Pain Problems: °Organization         Address  Phone   Notes  °[facility]  (336) 297-2271 Patients need to be referred by their primary care doctor.  ° °Medication Assistance: °Organization         Address  Phone   Notes  °[place] [facility] [address] °[place], [place] [address] (336) 641-8030 --Must be a resident of [place] °-- Must have NO insurance coverage whatsoever (no Medicaid/ Medicare, etc.) °-- The pt. MUST have a primary care doctor that directs their care regularly and follows them in the community °  °[facility]  (866) 331-1348   °[employer]  (888) 892-1162   ° °Agencies that provide inexpensive medical care: °Organization         Address  Phone   Notes  °[facility]  (336) 832-8035   °[facility]    (336) 832-7272   °[facility] [address] °[place], [place] [address] (336) 832-4777   °[facility] [address], °[place] (336) 271-4999   °[facility]    (336) 373-0678   °[facility]    (336) 272-1050   °[facility] ° [address], [place] Phone:  (336) 832-4444, Fax:  (336) 832-4440 Hours of Operation:  9 am - 6 pm, M-F.  Also accepts Medicaid/Medicare and self-pay.  °[facility] ° [address], [place] Phone: (336) 832-3150, Fax: (336) 832-3151. Hours of Operation:  8:30 am - 5:30 pm, M-F.  Also accepts Medicaid and self-pay.  °[facility] [place] [address], [place] Phone: (336) 878-6027   °[facility] [address], [place], [place] (336)723-1848, Ext. 123 Mondays & Thursdays: 7-9 AM.  First 15 patients are seen on a first come, first serve basis. °  ° °Medicaid-accepting [place]  Providers: ° °Organization         Address  Phone   Notes  °[facility] [address], [place] (336) 641-2100 Also accepts self-pay patients.  °[facility] [address], [place] ° (336) 856-9996   °[facility] [address], [place] (336) 288-8857   °[facility] [address] [place] [address], [place] (336) 299-7000   °Veita Bland [address], [place]  ° (336) 373-1557 Only accepts [employer] Access Medicaid patients after they have their name applied to their card.  ° °Self-Pay (  no insurance) in [place]: ° °Organization         Address  Phone   Notes  °Sickle Cell Patients, [facility] [address], [place] (336) 832-1970   °[facility] [address], [place] (336) 832-4400   °[facility] ° [address], [place] (336) 992-4800   °[facility]/Dr. Osei-Bonsu ° [address] [place] [address], [place] or [address], [place] (336) [phone] Phone number for both [place] and  [place] locations is the same.  °[facility] [address], [place] (336) 299-0000   °[facility] [address] [place] [address], [place] or [address] (336) 852-7530 °(336) 878-2260   °[facility] [address], [place] (336) 350-1642, phone; (336) 294-5005, fax Sees patients 1st and 3rd Saturday of every month.  Must not qualify for public or private insurance (i.e. Medicaid, Medicare, [place] Health Choice, Veterans' Benefits) • Household income should be no more than 200% of the poverty level •The clinic cannot treat you if you are pregnant or think you are pregnant • Sexually transmitted diseases are not treated at the clinic.  ° ° °Dental Care: °Organization         Address  Phone  Notes  °[facility] [address], [place] (336) 641-6152 Accepts children up to age 21 who are enrolled in Medicaid or [place] Health Choice; pregnant women with a Medicaid card; and children who have applied for Medicaid or [place] Health Choice, but were declined, whose parents can pay a reduced fee at time of service.  °[facility] [place]  [address], [place] (336) 641-7733 Accepts children up to age 21 who are enrolled in Medicaid or [place] Health Choice; pregnant women with a Medicaid card; and children who have applied for Medicaid or [place] Health Choice, but were declined, whose parents can pay a reduced fee at time of service.  °[facility] ° [address], [place] (336) 641-4533 Patients are seen by appointment only. Walk-ins are not accepted. [facility] will see patients 18 years of age and older. °Monday - Tuesday (8am-5pm) °Most Wednesdays (8:30-5pm) °$30 per visit, cash only  °[facility] ° [address], [place] (336) 641-4533 Patients are seen by appointment only. Walk-ins are not accepted. [facility] will see patients 18 years of age and older. °One Wednesday Evening (Monthly: Volunteer Based).  $30 per visit, cash only  °[facility]  (919) 537-3737 for adults; Children under age 4, call Graduate Pediatric Dentistry at (919) [phone]. Children aged 4-14, please call (919) [phone] to request a pediatric application. ° Dental services are provided in all areas of dental care including fillings, crowns and bridges, complete and partial dentures, implants, gum treatment, root canals, and extractions. Preventive care is also provided. Treatment is provided to both adults and children. °Patients are selected via a lottery and there is often a waiting list. °  °[facility] [address], °[place] ° (336) 763-8833 [email or web address] °  °[facility]  [address], [place], [place] (336)723-1848, Ext. 123 Second and Fourth Thursday of each month, opens at 6:30 AM; Clinic ends at 9 AM.  Patients are seen on a first-come first-served basis, and a limited number are   seen during each clinic.  ° °[facility] ° [address], [place], [place] (336) 723-7904   Eligibility Requirements °You must have lived in [place], [place], or [place] counties for at least the last three months. °  You cannot be eligible for state or federal sponsored healthcare insurance, including Veterans Administration, Medicaid, or Medicare. °  You generally cannot be eligible for healthcare insurance through your employer.  °  How to apply: °Eligibility screenings are held every Tuesday and Wednesday afternoon from 1:00 pm until 4:00 pm. You do not need an appointment for the interview!  °[facility] [address], [place], [place] 336-631-2330   °[facility]  336-342-8273   °[facility]  336-703-3100   °[facility]  336-570-6415   ° °Behavioral Health Resources in the Community: °Intensive Outpatient Programs °Organization         Address  Phone  Notes  °[facility] [address], [place], [place] 336-878-6098   °[facility] [address], [place], [place] 336-832-9800   °[facility] [address], [place], [place] ° 336-882-2125   °[facility] [address],  °[place], [place] 1-800-853-5163 or 336-641-4981   °Substance Abuse Resources °Organization         Address  Phone  Notes  °[facility]  336-882-2125   °[facility]  336-784-9470   °[facility]  336-285-9073   °[facility]  336-845-3988   °Residential & Outpatient Substance Abuse Program  1-800-659-3381   °Psychological Services °Organization         Address  Phone  Notes  °[facility]  336- 832-9600    °[facility]  336- 378-7881   °[facility] [address], [place] 1-800-853-5163 or 336-641-4981   ° °Mobile Crisis Teams °Organization         Address  Phone  Notes  °[facility], Mobile Crisis Care Unit  1-877-626-1772   °Assertive °[facility] ° [address] [place], [place] 336-834-9664   °Sharon DeEsch [address] °[place] 336-554-5454   ° °Self-Help/Support Groups °Organization         Address  Phone             Notes  °[facility] [place] [facility] - variety of support groups  336- 373-1402 Call for more information  °Narcotics Anonymous (NA), [facility] [address], °[place]  2 meetings at this location  ° °Residential Treatment Programs °Organization         Address  Phone  Notes  °[facility] [address],    °[place]  1-866-801-8205   °[facility] ° [address], [place], [place] 704-293-8524   °[facility] [address], [place] 336-845-3988 Admissions: 8am-3pm M-F  °[facility] [address]    °[place], [place] 336-841-1104   °[facility] [address], [place], [place] 336-379-7146   °[facility] [address]  °[place], [place] 336-285-9073   °[facility] Intensive Outpatient [address], [place], [place] 336-852-3033   °[facility] [address]  °[place], [place] 1-877-615-2722 or 336-784-9470   °[facility] [address] [place], [place] 336-227-7417 Accepts Medicaid  °[facility] [address]  °  [place] 1-800-659-3381 Substance Abuse/Addiction Treatment  ° °[place] Behavior[facility] Resources °Organization         Address  Phone  Notes  °[facility]  (888) 581-9988   °Julie Brannon, PhD [address] [place], [place]   (336) 349-5553 or (336) 951-0000    °[facility]   [address] °[place], [place] (336) 349-4454   °[facility] [address], [place], [place] (336) 342-8316 Insurance/Medicaid/sponsorship through [facility]  °[facility] [address]                                    [place], [place] (336) 342-8316 Therapy/tele-psych/case  °[facility] [address]  ° [place], [place] (336) 349-2233    °Dr. Arfeen  (336) 349-4544   °[facility] [place]  [employer] [place] [facility] 1) [address],  °2) [address], [place] °3)  [address], [place] (336) 349-3220 °(336) 342-7768 ° °(336) 342-8140   °[place] [facility] (336) 342-1394 or (336) 342-3537 (After Hours)    ° ° ° °

## 2015-01-15 NOTE — ED Notes (Signed)
Patient began having chest pain this AM while walking across the street to get coffee. $ baby ASA and 3 SL ntg per EMS.  O2 At 4 lpm.

## 2015-01-15 NOTE — ED Notes (Signed)
Pt is in stable condition upon d/c and is escorted from ED via wheelchair. 

## 2015-01-22 ENCOUNTER — Other Ambulatory Visit: Payer: Self-pay | Admitting: Cardiology

## 2015-01-22 DIAGNOSIS — R079 Chest pain, unspecified: Secondary | ICD-10-CM

## 2015-01-25 NOTE — ED Provider Notes (Signed)
CSN: [id number]     Arrival date & time 01/15/15  0910 History   First MD Initiated Contact with Patient 01/15/15 0911     Chief Complaint  Patient presents with  . Pleurisy     (Consider location/radiation/quality/duration/timing/severity/associated sxs/prior Treatment) HPI   64 year old male with chest pain. Onset shortly before arrival. Patient reports that he is walking taken himself some coffee when he began having sharp pain in the center of his chest. Has been persistent since then. Sniffling worse with movements and deep breathing. Does not feel short of breath. No fevers or chills. No cough. No unusual leg pain or swelling. Pain has improved since onset, but still present.  Past Medical History  Diagnosis Date  . Hypertension    Past Surgical History  Procedure Laterality Date  . Left heart catheterization with coronary angiogram N/A 03/19/2012    Procedure: LEFT HEART CATHETERIZATION WITH CORONARY ANGIOGRAM;  Surgeon: Robynn PaneMohan N Harwani, MD;  [facility];  Service: Cardiovascular;  Laterality: N/A;   No family history on file. Social History  Substance Use Topics  . Smoking status: Never Smoker   . Smokeless tobacco: Not on file  . Alcohol Use: No    Review of Systems  All systems reviewed and negative, other than as noted in HPI.   Allergies  Penicillins  Home Medications   Prior to Admission medications   Medication Sig Start Date End Date Taking? Authorizing Provider  lisinopril (PRINIVIL,ZESTRIL) 20 MG tablet Take 1 tablet (20 mg total) by mouth daily. 08/28/14  Yes Purvis SheffieldForrest Harrison, MD  metoprolol (LOPRESSOR) 50 MG tablet Take 0.5 tablets (25 mg total) by mouth 2 (two) times daily. 08/28/14  Yes Purvis SheffieldForrest Harrison, MD   BP 144/82 mmHg  Pulse 52  Resp 12  SpO2 100% Physical Exam  Constitutional: He appears well-developed and well-nourished. No distress.  HENT:  Head: Normocephalic and atraumatic.  Eyes: Conjunctivae are normal. Right eye  exhibits no discharge. Left eye exhibits no discharge.  Neck: Neck supple.  Cardiovascular: Normal rate, regular rhythm and normal heart sounds.  Exam reveals no gallop and no friction rub.   No murmur heard. Pulmonary/Chest: Effort normal and breath sounds normal. No respiratory distress.  Abdominal: Soft. He exhibits no distension. There is no tenderness.  Musculoskeletal: He exhibits no edema or tenderness.  Lower extremities symmetric as compared to each other. No calf tenderness. Negative Homan's. No palpable cords.   Neurological: He is alert.  Skin: Skin is warm and dry.  Psychiatric: He has a normal mood and affect. His behavior is normal. Thought content normal.  Nursing note and vitals reviewed.   ED Course  Procedures (including critical care time) Labs Review Labs Reviewed  COMPREHENSIVE METABOLIC PANEL - Abnormal; Notable for the following:    Sodium 134 (*)    CO2 21 (*)    Glucose, Bld 151 (*)    Creatinine, Ser 1.37 (*)    GFR calc non Af Amer 53 (*)    All other components within normal limits  CBC  TROPONIN I  TROPONIN I    Imaging Review No results found.   Dg Chest 2 View  01/15/2015  CLINICAL DATA:  Acute chest pain. EXAM: CHEST  2 VIEW COMPARISON:  March 17, 2012. FINDINGS: The heart size and mediastinal contours are within normal limits. Both lungs are clear. No pneumothorax or pleural effusion is noted. The visualized skeletal structures are unremarkable. IMPRESSION: No active cardiopulmonary disease. Electronically Signed   By: Fayrene FearingJames  Christen Butter, M.D.   On: 01/15/2015 10:39   I have personally reviewed and evaluated these images and lab results as part of my medical decision-making.   EKG Interpretation   Date/Time:  Friday January 15 2015 09:16:31 EDT Ventricular Rate:  82 PR Interval:  167 QRS Duration: 146 QT Interval:  420 QTC Calculation: 490 R Axis:   -93 Text Interpretation:  Sinus rhythm RBBB and LAFB inferior t wave  flattening/twi  noted on 02/2012 has resolved Confirmed by Remo Kirschenmann  MD,  Junior Huezo ([id number]) on 01/15/2015 9:26:19 AM      MDM   Final diagnoses:  Chest pain, unspecified chest pain type    64 year old male with chest pain. Atypical for ACS. Doubt PE, serious infection, dissection or other emergent process.    Raeford Razor, MD 01/25/15 [phone]

## 2015-01-29 ENCOUNTER — Encounter (HOSPITAL_COMMUNITY): Payer: Commercial Managed Care - HMO

## 2015-01-29 ENCOUNTER — Encounter (HOSPITAL_COMMUNITY): Admission: RE | Admit: 2015-01-29 | Payer: Commercial Managed Care - HMO | Source: Ambulatory Visit

## 2015-02-05 ENCOUNTER — Encounter (HOSPITAL_COMMUNITY)
Admission: RE | Admit: 2015-02-05 | Discharge: 2015-02-05 | Disposition: A | Discharge status: Home / Self Care | Payer: Commercial Managed Care - HMO | Source: Ambulatory Visit | Attending: Cardiology | Admitting: Cardiology

## 2015-02-05 DIAGNOSIS — R079 Chest pain, unspecified: Secondary | ICD-10-CM | POA: Diagnosis present

## 2015-02-05 MED ORDER — TECHNETIUM TC 99M SESTAMIBI GENERIC - CARDIOLITE
10.0000 | Freq: Once | INTRAVENOUS | Status: AC | PRN
  Administered 2015-02-05: 10 via INTRAVENOUS

## 2015-02-05 MED ORDER — REGADENOSON 0.4 MG/5ML IV SOLN: 0.4000 mg | Freq: Once | INTRAVENOUS | Status: DC

## 2015-02-05 MED ORDER — TECHNETIUM TC 99M SESTAMIBI GENERIC - CARDIOLITE
30.0000 | Freq: Once | INTRAVENOUS | Status: AC | PRN
  Administered 2015-02-05: 30 via INTRAVENOUS

## 2015-02-05 MED ORDER — REGADENOSON 0.4 MG/5ML IV SOLN
INTRAVENOUS | Status: AC
  Administered 2015-02-05: 0.4 mg
  Filled 2015-02-05: qty 5

## 2015-02-10 ENCOUNTER — Emergency Department (HOSPITAL_COMMUNITY)
Admission: EM | Admit: 2015-02-10 | Discharge: 2015-02-10 | Disposition: A | Discharge status: Home / Self Care | Payer: 59 | Source: Home / Self Care | Attending: Emergency Medicine | Admitting: Emergency Medicine

## 2015-02-10 ENCOUNTER — Encounter (HOSPITAL_COMMUNITY): Payer: Self-pay

## 2015-02-10 ENCOUNTER — Other Ambulatory Visit: Payer: Self-pay

## 2015-02-10 DIAGNOSIS — E785 Hyperlipidemia, unspecified: Secondary | ICD-10-CM

## 2015-02-10 NOTE — ED Provider Notes (Signed)
Tyrone Noble is a 64 y.o. male who presents to the emergency department stating he was directed by cardiologist Dr. Edwyna ShellHart wanted. After speaking with Dr. Sharyn LullHarwani, the patient was to report to his office not to the hospital. Patient denies any chest pain, shortness of breath, wheezing, leg pain, leg swelling, fevers or any complaint. He reports he feels fully normal. He reports he is due to have a cardiac cath at some point by his cardiologist. He reports he has no emergency. Will allow him to proceed to Dr. Annitta JerseyHarwani's office.  I advised to return to the emergency department with any new or worsening symptoms or new concerns. Patient verbalized understanding and agreement with plan.    Everlene FarrierWilliam Didi Ganaway, PA-C 02/10/15 [phone]  Margarita Grizzleanielle Ray, MD 02/11/15 [phone]

## 2015-02-10 NOTE — ED Notes (Addendum)
Pt showed up by accident and was supposed to go to dr. Isidore Moosffice. Will now go to dr office.

## 2015-02-10 NOTE — ED Notes (Signed)
Pt has heart block and has appt w/ cardiologist. Pt came to ED and said he was confused as to whether he was supposed to go to ER or dr. office.

## 2015-02-16 ENCOUNTER — Encounter (HOSPITAL_COMMUNITY): Payer: Self-pay | Admitting: Cardiology

## 2015-02-16 ENCOUNTER — Ambulatory Visit (HOSPITAL_COMMUNITY)
Admission: RE | Admit: 2015-02-16 | Discharge: 2015-02-17 | Disposition: A | Discharge status: Home / Self Care | Payer: Medicare HMO | Source: Ambulatory Visit | Attending: Cardiology | Admitting: Cardiology

## 2015-02-16 ENCOUNTER — Encounter (HOSPITAL_COMMUNITY)
Admission: RE | Disposition: A | Discharge status: Home / Self Care | Payer: Self-pay | Source: Ambulatory Visit | Attending: Cardiology

## 2015-02-16 DIAGNOSIS — K219 Gastro-esophageal reflux disease without esophagitis: Secondary | ICD-10-CM | POA: Insufficient documentation

## 2015-02-16 DIAGNOSIS — I209 Angina pectoris, unspecified: Secondary | ICD-10-CM | POA: Diagnosis present

## 2015-02-16 DIAGNOSIS — Z87891 Personal history of nicotine dependence: Secondary | ICD-10-CM | POA: Insufficient documentation

## 2015-02-16 DIAGNOSIS — I10 Essential (primary) hypertension: Secondary | ICD-10-CM | POA: Insufficient documentation

## 2015-02-16 DIAGNOSIS — E785 Hyperlipidemia, unspecified: Secondary | ICD-10-CM | POA: Diagnosis not present

## 2015-02-16 DIAGNOSIS — I25119 Atherosclerotic heart disease of native coronary artery with unspecified angina pectoris: Secondary | ICD-10-CM | POA: Insufficient documentation

## 2015-02-16 DIAGNOSIS — Z23 Encounter for immunization: Secondary | ICD-10-CM | POA: Insufficient documentation

## 2015-02-16 HISTORY — DX: Depression, unspecified: F32.A

## 2015-02-16 HISTORY — DX: Atherosclerotic heart disease of native coronary artery without angina pectoris: I25.10

## 2015-02-16 HISTORY — PX: CARDIAC CATHETERIZATION: SHX172

## 2015-02-16 HISTORY — DX: Headache: R51

## 2015-02-16 HISTORY — DX: Cerebral infarction, unspecified: I63.9

## 2015-02-16 HISTORY — DX: Major depressive disorder, single episode, unspecified: F32.9

## 2015-02-16 LAB — POCT ACTIVATED CLOTTING TIME: ACTIVATED CLOTTING TIME: 454 s

## 2015-02-16 SURGERY — LEFT HEART CATH AND CORONARY ANGIOGRAPHY
Anesthesia: LOCAL

## 2015-02-16 MED ORDER — SODIUM CHLORIDE 0.9 % WEIGHT BASED INFUSION
3.0000 mL/kg/h | INTRAVENOUS | Status: DC
  Administered 2015-02-16: 3 mL/kg/h via INTRAVENOUS

## 2015-02-16 MED ORDER — NITROGLYCERIN IN D5W 200-5 MCG/ML-% IV SOLN
5.0000 ug/min | INTRAVENOUS | Status: DC
  Administered 2015-02-16: 5 ug/min via INTRAVENOUS

## 2015-02-16 MED ORDER — IOHEXOL 350 MG/ML SOLN
INTRAVENOUS | Status: DC | PRN
  Administered 2015-02-16: 120 mL via INTRACARDIAC

## 2015-02-16 MED ORDER — OXYCODONE-ACETAMINOPHEN 5-325 MG PO TABS: 1.0000 | ORAL_TABLET | ORAL | Status: DC | PRN

## 2015-02-16 MED ORDER — BIVALIRUDIN 250 MG IV SOLR
INTRAVENOUS | Status: AC
  Filled 2015-02-16: qty 250

## 2015-02-16 MED ORDER — MIDAZOLAM HCL 2 MG/2ML IJ SOLN
INTRAMUSCULAR | Status: AC
  Filled 2015-02-16: qty 2

## 2015-02-16 MED ORDER — PRASUGREL HCL 10 MG PO TABS
ORAL_TABLET | ORAL | Status: AC
  Filled 2015-02-16: qty 1

## 2015-02-16 MED ORDER — SODIUM CHLORIDE 0.9 % WEIGHT BASED INFUSION
1.0000 mL/kg/h | INTRAVENOUS | Status: DC
  Administered 2015-02-16 (×2): 1 mL/kg/h via INTRAVENOUS

## 2015-02-16 MED ORDER — ASPIRIN 81 MG PO CHEW: 81.0000 mg | CHEWABLE_TABLET | ORAL | Status: DC

## 2015-02-16 MED ORDER — METOPROLOL TARTRATE 12.5 MG HALF TABLET
12.5000 mg | ORAL_TABLET | Freq: Two times a day (BID) | ORAL | Status: DC
  Administered 2015-02-16 – 2015-02-17 (×3): 12.5 mg via ORAL
  Filled 2015-02-16 (×3): qty 1

## 2015-02-16 MED ORDER — ADENOSINE 12 MG/4ML IV SOLN
12.0000 mL | Freq: Once | INTRAVENOUS | Status: DC
  Filled 2015-02-16: qty 12

## 2015-02-16 MED ORDER — FENTANYL CITRATE (PF) 100 MCG/2ML IJ SOLN
INTRAMUSCULAR | Status: AC
  Filled 2015-02-16: qty 2

## 2015-02-16 MED ORDER — SODIUM CHLORIDE 0.9 % IV SOLN: 250.0000 mL | INTRAVENOUS | Status: DC | PRN

## 2015-02-16 MED ORDER — BIVALIRUDIN BOLUS VIA INFUSION - CUPID
INTRAVENOUS | Status: DC | PRN
  Administered 2015-02-16: 50.325 mg via INTRAVENOUS

## 2015-02-16 MED ORDER — LIDOCAINE HCL (PF) 1 % IJ SOLN
INTRAMUSCULAR | Status: AC
  Filled 2015-02-16: qty 30

## 2015-02-16 MED ORDER — ONDANSETRON HCL 4 MG/2ML IJ SOLN: 4.0000 mg | Freq: Four times a day (QID) | INTRAMUSCULAR | Status: DC | PRN

## 2015-02-16 MED ORDER — ATORVASTATIN CALCIUM 40 MG PO TABS
40.0000 mg | ORAL_TABLET | Freq: Every day | ORAL | Status: DC
  Administered 2015-02-16: 40 mg via ORAL
  Filled 2015-02-16: qty 1

## 2015-02-16 MED ORDER — FENTANYL CITRATE (PF) 100 MCG/2ML IJ SOLN
INTRAMUSCULAR | Status: DC | PRN
  Administered 2015-02-16 (×2): 25 ug via INTRAVENOUS

## 2015-02-16 MED ORDER — SODIUM CHLORIDE 0.9 % IJ SOLN: 3.0000 mL | INTRAMUSCULAR | Status: DC | PRN

## 2015-02-16 MED ORDER — ACETAMINOPHEN 325 MG PO TABS: 650.0000 mg | ORAL_TABLET | ORAL | Status: DC | PRN

## 2015-02-16 MED ORDER — MIDAZOLAM HCL 2 MG/2ML IJ SOLN
INTRAMUSCULAR | Status: DC | PRN
  Administered 2015-02-16 (×2): 1 mg via INTRAVENOUS

## 2015-02-16 MED ORDER — NITROGLYCERIN IN D5W 200-5 MCG/ML-% IV SOLN
INTRAVENOUS | Status: DC | PRN
  Administered 2015-02-16: 5 ug/min via INTRAVENOUS

## 2015-02-16 MED ORDER — ANGIOPLASTY BOOK
Freq: Once | Status: AC
  Administered 2015-02-16: 20:00:00
  Filled 2015-02-16: qty 1

## 2015-02-16 MED ORDER — SODIUM CHLORIDE 0.9 % IJ SOLN: 3.0000 mL | Freq: Two times a day (BID) | INTRAMUSCULAR | Status: DC

## 2015-02-16 MED ORDER — NITROGLYCERIN 1 MG/10 ML FOR IR/CATH LAB
INTRA_ARTERIAL | Status: DC | PRN
  Administered 2015-02-16: 11:00:00

## 2015-02-16 MED ORDER — PRASUGREL HCL 10 MG PO TABS
10.0000 mg | ORAL_TABLET | Freq: Every day | ORAL | Status: DC
  Administered 2015-02-17: 09:00:00 10 mg via ORAL
  Filled 2015-02-16: qty 1

## 2015-02-16 MED ORDER — ASPIRIN 81 MG PO CHEW
81.0000 mg | CHEWABLE_TABLET | Freq: Every day | ORAL | Status: DC
  Administered 2015-02-17: 09:00:00 81 mg via ORAL
  Filled 2015-02-16: qty 1

## 2015-02-16 MED ORDER — HEPARIN (PORCINE) IN NACL 2-0.9 UNIT/ML-% IJ SOLN
INTRAMUSCULAR | Status: AC
  Filled 2015-02-16: qty 1000

## 2015-02-16 MED ORDER — PNEUMOCOCCAL VAC POLYVALENT 25 MCG/0.5ML IJ INJ
0.5000 mL | INJECTION | INTRAMUSCULAR | Status: AC
  Administered 2015-02-17: 0.5 mL via INTRAMUSCULAR
  Filled 2015-02-16: qty 0.5

## 2015-02-16 MED ORDER — INFLUENZA VAC SPLIT QUAD 0.5 ML IM SUSY
0.5000 mL | PREFILLED_SYRINGE | INTRAMUSCULAR | Status: AC
  Administered 2015-02-17: 09:00:00 0.5 mL via INTRAMUSCULAR

## 2015-02-16 MED ORDER — SODIUM CHLORIDE 0.9 % IV SOLN
250.0000 mg | INTRAVENOUS | Status: DC | PRN
  Administered 2015-02-16: 1.75 mg/kg/h via INTRAVENOUS

## 2015-02-16 MED ORDER — ADENOSINE (DIAGNOSTIC) 140MCG/KG/MIN
INTRAVENOUS | Status: DC | PRN
  Administered 2015-02-16: 140 ug/kg/min via INTRAVENOUS

## 2015-02-16 MED ORDER — PRASUGREL HCL 10 MG PO TABS
ORAL_TABLET | ORAL | Status: DC | PRN
  Administered 2015-02-16: 60 mg via ORAL

## 2015-02-16 MED ORDER — SODIUM CHLORIDE 0.9 % IV SOLN
INTRAVENOUS | Status: AC
  Administered 2015-02-16: 14:00:00 via INTRAVENOUS

## 2015-02-16 SURGICAL SUPPLY — 17 items
BALLN TREK RX 2.5X12 (BALLOONS) ×2
BALLN ~~LOC~~ TREK RX 3.25X20 (BALLOONS) ×2 IMPLANT
BALLOON TREK RX 2.5X12 (BALLOONS) ×1 IMPLANT
CATH INFINITI 5FR MULTPACK ANG (CATHETERS) ×2 IMPLANT
GUIDE CATH RUNWAY 6FR VL3.5 (CATHETERS) ×2 IMPLANT
GUIDEWIRE PRESSURE COMET II (WIRE) ×2 IMPLANT
KIT ENCORE 26 ADVANTAGE (KITS) ×2 IMPLANT
KIT ESSENTIALS PG (KITS) ×2 IMPLANT
KIT HEART LEFT (KITS) ×2 IMPLANT
PACK CARDIAC CATHETERIZATION (CUSTOM PROCEDURE TRAY) ×2 IMPLANT
SHEATH PINNACLE 5F 10CM (SHEATH) ×2 IMPLANT
SHEATH PINNACLE 6F 10CM (SHEATH) ×2 IMPLANT
STENT XIENCE ALPINE RX 3.0X38 (Permanent Stent) ×2 IMPLANT
SYR MEDRAD MARK V 150ML (SYRINGE) ×2 IMPLANT
TRANSDUCER W/STOPCOCK (MISCELLANEOUS) ×2 IMPLANT
TUBING CIL FLEX 10 FLL-RA (TUBING) ×2 IMPLANT
WIRE EMERALD 3MM-J .035X150CM (WIRE) ×2 IMPLANT

## 2015-02-16 NOTE — Progress Notes (Signed)
Site area: right groin  Site Prior to Removal:  Level 0  Pressure Applied For 20 MINUTES    Minutes Beginning at 1410  Manual:   Yes.    Patient Status During Pull:  STABLE  Post Pull Groin Site:  Level 0  Post Pull Instructions Given:  Yes.    Post Pull Pulses Present:  Yes.    Dressing Applied:  Yes.    Comments:

## 2015-02-16 NOTE — Interval H&P Note (Signed)
Cath Lab Visit (complete for each Cath Lab visit)  Clinical Evaluation Leading to the Procedure:   ACS: No.  Non-ACS:    Anginal Classification: CCS III  Anti-ischemic medical therapy: Maximal Therapy (2 or more classes of medications)  Non-Invasive Test Results: Intermediate-risk stress test findings: cardiac mortality 1-3%/year  Prior CABG: No previous CABG      History and Physical Interval Note:  02/16/2015 9:04 AM  Tyrone Noble  has presented today for surgery, with the diagnosis of Abnormal Stress Test/CP  The various methods of treatment have been discussed with the patient and family. After consideration of risks, benefits and other options for treatment, the patient has consented to  Procedure(s): Left Heart Cath and Coronary Angiography (N/A) as a surgical intervention .  The patient's history has been reviewed, patient examined, no change in status, stable for surgery.  I have reviewed the patient's chart and labs.  Questions were answered to the patient's satisfaction.     Rinaldo CloudHarwani, Cassandr Cederberg

## 2015-02-16 NOTE — H&P (Signed)
Printed H&P in the chart needs to be scanned 

## 2015-02-17 DIAGNOSIS — I25119 Atherosclerotic heart disease of native coronary artery with unspecified angina pectoris: Secondary | ICD-10-CM | POA: Diagnosis not present

## 2015-02-17 DIAGNOSIS — I10 Essential (primary) hypertension: Secondary | ICD-10-CM | POA: Diagnosis not present

## 2015-02-17 DIAGNOSIS — K219 Gastro-esophageal reflux disease without esophagitis: Secondary | ICD-10-CM | POA: Diagnosis not present

## 2015-02-17 DIAGNOSIS — E785 Hyperlipidemia, unspecified: Secondary | ICD-10-CM | POA: Diagnosis not present

## 2015-02-17 LAB — CBC
HCT: 39.3 % (ref 39.0–52.0)
HEMOGLOBIN: 13.5 g/dL (ref 13.0–17.0)
MCH: 29.8 pg (ref 26.0–34.0)
MCHC: 34.4 g/dL (ref 30.0–36.0)
MCV: 86.8 fL (ref 78.0–100.0)
Platelets: 208 10*3/uL (ref 150–400)
RBC: 4.53 MIL/uL (ref 4.22–5.81)
RDW: 14.5 % (ref 11.5–15.5)
WBC: 7.5 10*3/uL (ref 4.0–10.5)

## 2015-02-17 LAB — BASIC METABOLIC PANEL
ANION GAP: 5 (ref 5–15)
BUN: 6 mg/dL (ref 6–20)
CALCIUM: 8.8 mg/dL — AB (ref 8.9–10.3)
CHLORIDE: 106 mmol/L (ref 101–111)
CO2: 27 mmol/L (ref 22–32)
Creatinine, Ser: 1.04 mg/dL (ref 0.61–1.24)
GFR calc non Af Amer: >60 mL/min (ref >60)
Glucose, Bld: 113 mg/dL — ABNORMAL HIGH (ref 65–99)
Potassium: 4.7 mmol/L (ref 3.5–5.1)
SODIUM: 138 mmol/L (ref 135–145)

## 2015-02-17 MED ORDER — AMLODIPINE BESYLATE 5 MG PO TABS: 5.0000 mg | ORAL_TABLET | Freq: Every day | ORAL | Status: DC

## 2015-02-17 MED ORDER — LISINOPRIL 10 MG PO TABS
20.0000 mg | ORAL_TABLET | Freq: Every day | ORAL | Status: DC
  Administered 2015-02-17: 09:00:00 20 mg via ORAL
  Filled 2015-02-17: qty 2

## 2015-02-17 MED ORDER — LISINOPRIL 20 MG PO TABS: 20.0000 mg | ORAL_TABLET | Freq: Every day | ORAL | Status: DC

## 2015-02-17 MED ORDER — AMLODIPINE BESYLATE 5 MG PO TABS
5.0000 mg | ORAL_TABLET | Freq: Every day | ORAL | Status: DC
  Administered 2015-02-17: 09:00:00 5 mg via ORAL
  Filled 2015-02-17: qty 1

## 2015-02-17 MED ORDER — NITROGLYCERIN 0.4 MG SL SUBL
0.4000 mg | SUBLINGUAL_TABLET | SUBLINGUAL | Status: DC | PRN
Start: 2015-02-17 — End: 2018-02-26

## 2015-02-17 MED ORDER — ASPIRIN 81 MG PO CHEW: 81.0000 mg | CHEWABLE_TABLET | Freq: Every day | ORAL | Status: AC

## 2015-02-17 MED ORDER — ATORVASTATIN CALCIUM 40 MG PO TABS: 40.0000 mg | ORAL_TABLET | Freq: Every day | ORAL | Status: DC

## 2015-02-17 MED ORDER — PRASUGREL HCL 10 MG PO TABS: 10.0000 mg | ORAL_TABLET | Freq: Every day | ORAL | Status: DC

## 2015-02-17 NOTE — Discharge Instructions (Signed)
Angina Pectoris Angina pectoris, often called angina, is extreme discomfort in the chest, neck, or arm. This is caused by a lack of blood in the middle and thickest layer of the heart wall (myocardium). There are four types of angina:  Stable angina. Stable angina usually occurs in episodes of predictable frequency and duration. It is usually brought on by physical activity, stress, or excitement. Stable angina usually lasts a few minutes and can often be relieved by a medicine that you place under your tongue. This medicine is called sublingual nitroglycerin.  Unstable angina. Unstable angina can occur even when you are doing little or no physical activity. It can even occur while you are sleeping or when you are at rest. It can suddenly increase in severity or frequency. It may not be relieved by sublingual nitroglycerin, and it can last up to 30 minutes.  Microvascular angina. This type of angina is caused by a disorder of tiny blood vessels called arterioles. Microvascular angina is more common in women. The pain may be more severe and last longer than other types of angina pectoris.  Prinzmetal or variant angina. This type of angina pectoris is rare and usually occurs when you are doing little or no physical activity. It especially occurs in the early morning hours. CAUSES Atherosclerosis is the cause of angina. This is the buildup of fat and cholesterol (plaque) on the inside of the arteries. Over time, the plaque may narrow or block the artery, and this will lessen blood flow to the heart. Plaque can also become weak and break off within a coronary artery to form a clot and cause a sudden blockage. RISK FACTORS Risk factors common to both men and women include:  High cholesterol levels.  High blood pressure (hypertension).  Tobacco use.  Diabetes.  Family history of angina.  Obesity.  Lack of exercise.  A diet high in saturated fats. Women are at greater risk for angina if they  are:  Over age 53.  Postmenopausal. SYMPTOMS Many people do not experience any symptoms during the early stages of angina. As the condition progresses, symptoms common to both men and women may include:  Chest pain.  The pain can be described as a crushing or squeezing in the chest, or a tightness, pressure, fullness, or heaviness in the chest.  The pain can last more than a few minutes, or it can stop and recur.  Pain in the arms, neck, jaw, or back.  Unexplained heartburn or indigestion.  Shortness of breath.  Nausea.  Sudden cold sweats.  Sudden light-headedness. Many women have chest discomfort and some of the other symptoms. However, women often have different (atypical) symptoms, such as:   Fatigue.  Unexplained feelings of nervousness or anxiety.  Unexplained weakness.  Dizziness or fainting. Sometimes, women may have angina without any symptoms. DIAGNOSIS  Tests to diagnose angina may include:  ECG (electrocardiogram).  Exercise stress test. This looks for signs of blockage when the heart is being exercised.  Pharmacologic stress test. This test looks for signs of blockage when the heart is being stressed with a medicine.  Blood tests.  Coronary angiogram. This is a procedure to look at the coronary arteries to see if there is any blockage. TREATMENT  The treatment of angina may include the following:  Healthy behavioral changes to reduce or control risk factors.  Medicine.  Coronary stenting.A stent helps to keep an artery open.  Coronary angioplasty. This procedure widens a narrowed or blocked artery.  Coronary arterybypass  surgery. This will allow your blood to pass the blockage (bypass) to reach your heart. HOME CARE INSTRUCTIONS   Take medicines only as directed by your health care provider.  Do not take the following medicines unless your health care provider approves:  Nonsteroidal anti-inflammatory drugs (NSAIDs), such as ibuprofen,  naproxen, or celecoxib.  Vitamin supplements that contain vitamin A, vitamin E, or both.  Hormone replacement therapy that contains estrogen with or without progestin.  Manage other health conditions such as hypertension and diabetes as directed by your health care provider.  Follow a heart-healthy diet. A dietitian can help to educate you about healthy food options and changes.  Use healthy cooking methods such as roasting, grilling, broiling, baking, poaching, steaming, or stir-frying. Talk to a dietitian to learn more about healthy cooking methods.  Follow an exercise program approved by your health care provider.  Maintain a healthy weight. Lose weight as approved by your health care provider.  Plan rest periods when fatigued.  Learn to manage stress.  Do not use any tobacco products, including cigarettes, chewing tobacco, or electronic cigarettes. If you need help quitting, ask your health care provider.  If you drink alcohol, and your health care provider approves, limit your alcohol intake to no more than 1 drink per day. One drink equals 12 ounces of beer, 5 ounces of wine, or 1 ounces of hard liquor.  Stop illegal drug use.  Keep all follow-up visits as directed by your health care provider. This is important. SEEK IMMEDIATE MEDICAL CARE IF:   You have pain in your chest, neck, arm, jaw, stomach, or back that lasts more than a few minutes, is recurring, or is unrelieved by taking sublingualnitroglycerin.  You have profuse sweating without cause.  You have unexplained:  Heartburn or indigestion.  Shortness of breath or difficulty breathing.  Nausea or vomiting.  Fatigue.  Feelings of nervousness or anxiety.  Weakness.  Diarrhea.  You have sudden light-headedness or dizziness.  You faint. These symptoms may represent a serious problem that is an emergency. Do not wait to see if the symptoms will go away. Get medical help right away. Call your local  emergency services (911 in the [place] Do not drive yourself to the hospital.   This information is not intended to replace advice given to you by your health care provider. Make sure you discuss any questions you have with your health care provider.   Document Released: 03/06/2005 Document Revised: 03/27/2014 Document Reviewed: 07/08/2013 Elsevier Interactive Patient Education 2016 [employer] Coronary Angiogram With Stent Coronary angiography with stent placement is a procedure to widen or open a narrow blood vessel of the heart (coronary artery). When a coronary artery becomes partially blocked, it decreases blood flow to that area. This may lead to chest pain or a heart attack (myocardial infarction). Arteries may become blocked by cholesterol buildup (plaque) in the lining or wall.  A stent is a small piece of metal that looks like a mesh or a spring. Stent placement may be done right after a coronary angiography in which a blocked artery is found or as a treatment for a heart attack.  LET [facility] Hospital[facility]OUR HEALTH CARE PROVIDER KNOW ABOUT:  Any allergies you have.   All medicines you are taking, including vitamins, herbs, eye drops, creams, and over-the-counter medicines.   Previous problems you or members of your family have had with the use of anesthetics.   Any blood disorders you have.   Previous surgeries you have had.  Medical conditions you have. RISKS AND COMPLICATIONS Generally, coronary angiography with stent is a safe procedure. However, problems can occur and include:  Damage to the heart or its blood vessels.   A return of blockage.   Bleeding, infection, or bruising at the insertion site.   A collection of blood under the skin (hematoma) at the insertion site.  Blood clot in another part of the body.   Kidney injury.   Allergic reaction to the dye or contrast used.   Bleeding into the abdomen (retroperitoneal bleeding). BEFORE THE PROCEDURE  Do not eat or  drink anything after midnight on the night before the procedure or as directed by your health care provider.  Ask your health care provider about changing or stopping your regular medicines. This is especially important if you are taking diabetes medicines or blood thinners.  Your health care provider will make sure you understand the procedure as well as the risks and potential problems associated with the procedure.  PROCEDURE  You may be given a medicine to help you relax before and during the procedure (sedative). This medicine will be given through an IV tube that is put into one of your veins.   The area where the catheter will be inserted will be shaved and cleaned. This is usually done in the groin but may be done in the fold of your arm (near your elbow) or in the wrist.   A medicine will be given to numb the area where the catheter will be inserted (local anesthetic).   The catheter will be inserted into an artery using a guide wire. A type of X-ray (fluoroscopy) will be used to help guide the catheter to the opening of the blocked artery.   A dye will then be injected into the catheter, and X-rays will be taken. The dye will help to show where any narrowing or blockages are located in the heart arteries.   A tiny wire will be guided to the blocked spot, and a balloon will be inflated to make the artery wider. The stent will be expanded and will crush the plaque into the wall of the vessel. The stent will hold the area open like a scaffolding and improve the blood flow.   Sometimes the artery may be made wider using a laser or other tools to remove plaque.   When the blood flow is better, the catheter will be removed. The lining of the artery will grow over the stent, which stays where it was placed.  AFTER THE PROCEDURE  If the procedure is done through the leg, you will be kept in bed lying flat for about 6 hours. You will be instructed to not bend or cross your legs.    The insertion site will be checked frequently.   The pulse in your feet or wrist will be checked frequently.   Additional blood tests, X-rays, and electrocardiography may be done.   This information is not intended to replace advice given to you by your health care provider. Make sure you discuss any questions you have with your health care provider.   Document Released: 09/10/2002 Document Revised: 03/27/2014 Document Reviewed: 07/29/2012 Elsevier Interactive Patient Education [employer]

## 2015-02-17 NOTE — Discharge Summary (Signed)
Discharge summary dictated on 02/17/2015 dictation number is [id number]

## 2015-02-17 NOTE — Progress Notes (Signed)
CARDIAC REHAB PHASE I   PRE:  Rate/Rhythm: 73 SR    BP: sitting 180/96    SaO2:   MODE:  Ambulation: 1000 ft   POST:  Rate/Rhythm: 80 SR    BP: sitting 162/96     SaO2:   Tolerated well except BP elevated. Denied sx, feels much better. Ed completed with brother present. Will send referral to [place] [facility]. Understands importance of [place], gave booklet.] [id number]   Harriet MassonReeve, [name] CES, ACSM 02/17/2015 9:00 AM

## 2015-02-17 NOTE — Progress Notes (Signed)
Subjective:  We will denies any chest pain or shortness of breath states feels much better after PCI. Ambulating in room without any problems.  Objective:  Vital Signs in the last 24 hours: Temp:  [97.9 F (36.6 C)-98.3 F (36.8 C)] 98.1 F (36.7 C) (11/30 0318) Pulse Rate:  [0-73] 72 (11/30 0318) Resp:  [0-30] 18 (11/30 0318) BP: (108-170)/(68-108) 141/78 mmHg (11/30 0318) SpO2:  [0 %-100 %] 99 % (11/30 0318) Weight:  [145 lb 8.1 oz (66 kg)] 145 lb 8.1 oz (66 kg) (11/30 0318)  Intake/Output from previous day: 11/29 0701 - 11/30 0700 In: 1599.5 [P.O.:480; I.V.:1119.5] Out: 2325 [Urine:2325] Intake/Output from this shift:    Physical Exam: Neck: no adenopathy, no carotid bruit, no JVD and supple, symmetrical, trachea midline Lungs: clear to auscultation bilaterally Heart: regular rate and rhythm, S1, S2 normal and Soft systolic murmur noted Abdomen: soft, non-tender; bowel sounds normal; no masses,  no organomegaly Extremities: extremities normal, atraumatic, no cyanosis or edema and Right groin stable  Lab Results:  Recent Labs  02/17/15 0450  WBC 7.5  HGB 13.5  PLT 208    Recent Labs  02/17/15 0450  NA 138  K 4.7  CL 106  CO2 27  GLUCOSE 113*  BUN 6  CREATININE 1.04   No results for input(s): TROPONINI in the last 72 hours.  Invalid input(s): CK, MB Hepatic Function Panel No results for input(s): PROT, ALBUMIN, AST, ALT, ALKPHOS, BILITOT, BILIDIR, IBILI in the last 72 hours. No results for input(s): CHOL in the last 72 hours. No results for input(s): PROTIME in the last 72 hours.  Imaging: Imaging results have been reviewed and No results found.  Cardiac Studies:  Assessment/Plan:  New-onset angina and positive nuclear stress test status post left cardiac cath/fractional flow reserve to LAD which was strongly positive status post PTCA stenting to mid LAD with excellent angiographic results Hypertension Hyperlipidemia GERD History of tobacco abuse   Plan Patient will be seen in cardiac rehabilitation prior to discharge was scheduled for phase II cardiac rehabilitation as outpatient. Discussed with patient regarding diet and lifestyle changes compliance with medication exercise etc. Follow-up with me in one week Discharge summary dictated     Rinaldo CloudHarwani, Tyrone Noble 02/17/2015, 8:14 AM

## 2015-02-18 NOTE — Discharge Summary (Signed)
Tyrone Noble, Tyrone Noble            ACCOUNT NO.:  [id number]  MEDICAL RECORD NO.:  [id number]  LOCATION:  6C01C                        FACILITY:  [facility]  PHYSICIAN:  Tyrone Noble, M.D. DATE OF BIRTH:  [birth year]  DATE OF ADMISSION:  02/16/2015 DATE OF DISCHARGE:  02/17/2015                              DISCHARGE SUMMARY   ADMITTING DIAGNOSES:  New onset angina, positive Lexiscan Myoview, rule out coronary insufficiency, coronary artery disease, hypertension, gastroesophageal reflux disease, hyperlipidemia, history of tobacco abuse.  DISCHARGE DIAGNOSES: 1. New onset angina, positive Lexiscan Myoview, status post cardiac     catheterization/fractional flow reserve to mid LAD which was     strongly positive 0.063, subsequently had PTCA stenting to mid LAD     with excellent angiographic results. 2. Hypertension. 3. Hyperlipidemia. 4. History of tobacco abuse. 5. Gastroesophageal reflux disease.  DISCHARGE HOME MEDICATIONS: 1. Aspirin 81 mg 1 tablet daily. 2. Prasugrel 10 mg 1 tablet daily. 3. Atorvastatin 40 mg 1 tablet daily. 4. Nitrostat 0.4 mg sublingual p.r.n. 5. Lisinopril 20 mg 1 tablet daily. 6. Metoprolol tartrate 25 mg twice daily.  DIET:  Low salt, low cholesterol, 1800 calories ADA diet.  The patient has been advised to avoid sweets.  The patient has been discussed at length regarding diet, lifestyle changes, compliance with medication, and exercise.  Post cardiac cath/PTCA stent instructions have been given.  FOLLOWUP:  Follow up with me in 1 week.  The patient will be scheduled for phase 2 cardiac rehab as outpatient.  CONDITION AT DISCHARGE:  Stable.  BRIEF HISTORY AND HOSPITAL COURSE:  [name] is a 64 year old male with past medical history significant for mild-to-moderate coronary artery disease, hypertension, hyperlipidemia, GERD, depression, history of alcohol abuse in the past.  He came to the office complaining of retrosternal chest pain  associated with diaphoresis off and on.  Seen in the ER few weeks ago, had EKG and 2 sets of troponin which were negative and discharged home.  The patient gives history of exertional chest pain relieved with rest.  Denies any PND, orthopnea, or leg swelling.  The patient subsequently underwent Lexiscan Myoview on February 05, 2015, which showed moderate focus of reversible ischemia at the base of the lateral wall with EF of 57%.  PHYSICAL EXAMINATION:  GENERAL:  He was alert, awake, oriented x3, in no acute distress. VITAL SIGNS:  His blood pressure was 163/97, pulse was 56 and regular. EYES:  Conjunctivae were pink. NECK:  Supple.  No JVD.  No bruit. LUNGS:  Clear to auscultation without rhonchi or rales. CARDIOVASCULAR:  He was bradycardiac, regular rhythm, there was 1/6 systolic murmur. LUNGS:  Clear to auscultation without rhonchi, rales, or wheezing. EXTREMITIES:  There was no clubbing, cyanosis, or edema. NEUROLOGIC:  Grossly intact.  MEDICATIONS:  His medications at home were: 1. Aspirin 81 mg daily. 2. Lisinopril 20 mg 1 tablet twice daily. 3. Protonix 40 mg daily. 4. Simvastatin 40 mg 1 tab at bedtime. 5. Toprol-XL 25 mg daily. 6. Norvasc was added, 5 mg daily and also clopidogrel was added 75 mg     daily prior to admission.  LABS:  Postprocedure; sodium 138, potassium 4.7, BUN 6, creatinine 1.04, glucose was  slightly elevated at 113.  Hemoglobin was 13.5, hematocrit 39.3, white count of 7.5.  BRIEF HISTORY AND HOSPITAL COURSE:  The patient was a.m. admit and subsequently underwent left cardiac catheterization and fractional flow reserve, followed by PTCA stenting to mid LAD as per procedure report. The patient tolerated the procedure well.  There were no complications. Postprocedure, the patient did not have any episodes of anginal chest pain.  His groin is stable with no evidence of hematoma or bruit.  The patient is ambulating in room without any problems.  The  patient is being seen by phase 1 cardiac rehab and will be scheduled for phase 2 cardiac rehab as outpatient.  The patient has been discussed at length regarding lifestyle changes, diet, exercise, and joining phase 2 cardiac rehab to which he agrees.  The patient states he has stopped smoking approximately 10 years ago.     Tyrone OsierMohan N. Sharyn LullHarwani, M.D.     [facility]/MEDQ  D:  02/17/2015  T:  02/18/2015  Job:  [id number]

## 2015-02-25 ENCOUNTER — Telehealth (HOSPITAL_COMMUNITY): Payer: Self-pay

## 2015-02-25 NOTE — Telephone Encounter (Signed)
Spoke with pt and pt is interested in doing [facility] program. [facility]acting insurance provider for financial coverage/responsbility.

## 2015-05-27 ENCOUNTER — Inpatient Hospital Stay (HOSPITAL_COMMUNITY): Admission: RE | Admit: 2015-05-27 | Payer: Commercial Managed Care - HMO | Source: Ambulatory Visit

## 2015-05-31 ENCOUNTER — Ambulatory Visit (HOSPITAL_COMMUNITY): Payer: Commercial Managed Care - HMO

## 2015-06-02 ENCOUNTER — Ambulatory Visit (HOSPITAL_COMMUNITY): Payer: Commercial Managed Care - HMO

## 2015-06-03 ENCOUNTER — Ambulatory Visit (HOSPITAL_COMMUNITY): Payer: Commercial Managed Care - HMO

## 2015-06-04 ENCOUNTER — Ambulatory Visit (HOSPITAL_COMMUNITY): Payer: Commercial Managed Care - HMO

## 2015-06-07 ENCOUNTER — Ambulatory Visit (HOSPITAL_COMMUNITY): Payer: Commercial Managed Care - HMO

## 2015-06-09 ENCOUNTER — Ambulatory Visit (HOSPITAL_COMMUNITY): Payer: Commercial Managed Care - HMO

## 2015-06-11 ENCOUNTER — Ambulatory Visit (HOSPITAL_COMMUNITY): Payer: Commercial Managed Care - HMO

## 2015-06-14 ENCOUNTER — Ambulatory Visit (HOSPITAL_COMMUNITY): Payer: Commercial Managed Care - HMO

## 2015-06-16 ENCOUNTER — Ambulatory Visit (HOSPITAL_COMMUNITY): Payer: Commercial Managed Care - HMO

## 2015-06-18 ENCOUNTER — Ambulatory Visit (HOSPITAL_COMMUNITY): Payer: Commercial Managed Care - HMO

## 2015-06-21 ENCOUNTER — Ambulatory Visit (HOSPITAL_COMMUNITY): Payer: Commercial Managed Care - HMO

## 2015-06-23 ENCOUNTER — Ambulatory Visit (HOSPITAL_COMMUNITY): Payer: Commercial Managed Care - HMO

## 2015-06-25 ENCOUNTER — Ambulatory Visit (HOSPITAL_COMMUNITY): Payer: Commercial Managed Care - HMO

## 2015-06-28 ENCOUNTER — Ambulatory Visit (HOSPITAL_COMMUNITY): Payer: Commercial Managed Care - HMO

## 2015-06-30 ENCOUNTER — Ambulatory Visit (HOSPITAL_COMMUNITY): Payer: Commercial Managed Care - HMO

## 2015-07-02 ENCOUNTER — Ambulatory Visit (HOSPITAL_COMMUNITY): Payer: Commercial Managed Care - HMO

## 2015-07-05 ENCOUNTER — Ambulatory Visit (HOSPITAL_COMMUNITY): Payer: Commercial Managed Care - HMO

## 2015-07-07 ENCOUNTER — Ambulatory Visit (HOSPITAL_COMMUNITY): Payer: Commercial Managed Care - HMO

## 2015-07-09 ENCOUNTER — Ambulatory Visit (HOSPITAL_COMMUNITY): Payer: Commercial Managed Care - HMO

## 2015-07-12 ENCOUNTER — Ambulatory Visit (HOSPITAL_COMMUNITY): Payer: Commercial Managed Care - HMO

## 2015-07-14 ENCOUNTER — Ambulatory Visit (HOSPITAL_COMMUNITY): Payer: Commercial Managed Care - HMO

## 2015-07-16 ENCOUNTER — Ambulatory Visit (HOSPITAL_COMMUNITY): Payer: Commercial Managed Care - HMO

## 2015-07-19 ENCOUNTER — Ambulatory Visit (HOSPITAL_COMMUNITY): Payer: Commercial Managed Care - HMO

## 2015-07-21 ENCOUNTER — Ambulatory Visit (HOSPITAL_COMMUNITY): Payer: Commercial Managed Care - HMO

## 2015-07-23 ENCOUNTER — Ambulatory Visit (HOSPITAL_COMMUNITY): Payer: Commercial Managed Care - HMO

## 2015-07-26 ENCOUNTER — Ambulatory Visit (HOSPITAL_COMMUNITY): Payer: Commercial Managed Care - HMO

## 2015-07-28 ENCOUNTER — Ambulatory Visit (HOSPITAL_COMMUNITY): Payer: Commercial Managed Care - HMO

## 2015-07-30 ENCOUNTER — Ambulatory Visit (HOSPITAL_COMMUNITY): Payer: Commercial Managed Care - HMO

## 2015-08-02 ENCOUNTER — Ambulatory Visit (HOSPITAL_COMMUNITY): Payer: Commercial Managed Care - HMO

## 2015-08-04 ENCOUNTER — Ambulatory Visit (HOSPITAL_COMMUNITY): Payer: Commercial Managed Care - HMO

## 2015-08-06 ENCOUNTER — Ambulatory Visit (HOSPITAL_COMMUNITY): Payer: Commercial Managed Care - HMO

## 2015-08-09 ENCOUNTER — Ambulatory Visit (HOSPITAL_COMMUNITY): Payer: Commercial Managed Care - HMO

## 2015-08-11 ENCOUNTER — Ambulatory Visit (HOSPITAL_COMMUNITY): Payer: Commercial Managed Care - HMO

## 2015-08-13 ENCOUNTER — Ambulatory Visit (HOSPITAL_COMMUNITY): Payer: Commercial Managed Care - HMO

## 2015-08-16 ENCOUNTER — Ambulatory Visit (HOSPITAL_COMMUNITY): Payer: Commercial Managed Care - HMO

## 2015-08-18 ENCOUNTER — Ambulatory Visit (HOSPITAL_COMMUNITY): Payer: Commercial Managed Care - HMO

## 2015-08-20 ENCOUNTER — Ambulatory Visit (HOSPITAL_COMMUNITY): Payer: Commercial Managed Care - HMO

## 2015-08-23 ENCOUNTER — Ambulatory Visit (HOSPITAL_COMMUNITY): Payer: Commercial Managed Care - HMO

## 2015-08-25 ENCOUNTER — Ambulatory Visit (HOSPITAL_COMMUNITY): Payer: Commercial Managed Care - HMO

## 2015-08-27 ENCOUNTER — Ambulatory Visit (HOSPITAL_COMMUNITY): Payer: Commercial Managed Care - HMO

## 2015-08-30 ENCOUNTER — Ambulatory Visit (HOSPITAL_COMMUNITY): Payer: Commercial Managed Care - HMO

## 2015-09-01 ENCOUNTER — Ambulatory Visit (HOSPITAL_COMMUNITY): Payer: Commercial Managed Care - HMO

## 2015-09-03 ENCOUNTER — Ambulatory Visit (HOSPITAL_COMMUNITY): Payer: Commercial Managed Care - HMO

## 2015-09-06 ENCOUNTER — Ambulatory Visit (HOSPITAL_COMMUNITY): Payer: Commercial Managed Care - HMO

## 2015-09-08 ENCOUNTER — Ambulatory Visit (HOSPITAL_COMMUNITY): Payer: Commercial Managed Care - HMO

## 2015-09-10 ENCOUNTER — Ambulatory Visit (HOSPITAL_COMMUNITY): Payer: Commercial Managed Care - HMO

## 2015-11-01 ENCOUNTER — Other Ambulatory Visit: Payer: Self-pay | Admitting: Cardiology

## 2015-11-01 DIAGNOSIS — R079 Chest pain, unspecified: Secondary | ICD-10-CM

## 2015-11-08 ENCOUNTER — Encounter (HOSPITAL_COMMUNITY): Payer: Medicare HMO

## 2015-11-08 ENCOUNTER — Encounter (HOSPITAL_COMMUNITY): Admission: RE | Admit: 2015-11-08 | Payer: Medicare HMO | Source: Ambulatory Visit

## 2015-11-17 ENCOUNTER — Encounter (HOSPITAL_COMMUNITY)
Admission: RE | Admit: 2015-11-17 | Discharge: 2015-11-17 | Disposition: A | Discharge status: Home / Self Care | Payer: Medicare HMO | Source: Ambulatory Visit | Attending: Cardiology | Admitting: Cardiology

## 2015-11-17 DIAGNOSIS — R079 Chest pain, unspecified: Secondary | ICD-10-CM | POA: Insufficient documentation

## 2015-11-17 MED ORDER — REGADENOSON 0.4 MG/5ML IV SOLN
INTRAVENOUS | Status: AC
  Administered 2015-11-17: 0.4 mg via INTRAVENOUS
  Filled 2015-11-17: qty 5

## 2015-11-17 MED ORDER — TECHNETIUM TC 99M TETROFOSMIN IV KIT
10.0000 | PACK | Freq: Once | INTRAVENOUS | Status: AC | PRN
  Administered 2015-11-17: 10 via INTRAVENOUS

## 2015-11-17 MED ORDER — TECHNETIUM TC 99M TETROFOSMIN IV KIT
30.0000 | PACK | Freq: Once | INTRAVENOUS | Status: AC | PRN
Start: 2015-11-17 — End: 2015-11-17
  Administered 2015-11-17: 30 via INTRAVENOUS

## 2015-11-17 MED ORDER — REGADENOSON 0.4 MG/5ML IV SOLN
0.4000 mg | Freq: Once | INTRAVENOUS | Status: AC
Start: 2015-11-17 — End: 2015-11-17
  Administered 2015-11-17: 0.4 mg via INTRAVENOUS

## 2015-12-28 ENCOUNTER — Encounter (HOSPITAL_COMMUNITY): Payer: Self-pay | Admitting: Emergency Medicine

## 2015-12-28 ENCOUNTER — Emergency Department (HOSPITAL_COMMUNITY)
Admission: EM | Admit: 2015-12-28 | Discharge: 2015-12-28 | Payer: Medicare HMO | Attending: Emergency Medicine | Admitting: Emergency Medicine

## 2015-12-28 ENCOUNTER — Emergency Department (HOSPITAL_COMMUNITY): Payer: Medicare HMO

## 2015-12-28 DIAGNOSIS — I10 Essential (primary) hypertension: Secondary | ICD-10-CM | POA: Diagnosis not present

## 2015-12-28 DIAGNOSIS — Z049 Encounter for examination and observation for unspecified reason: Secondary | ICD-10-CM

## 2015-12-28 DIAGNOSIS — R079 Chest pain, unspecified: Secondary | ICD-10-CM | POA: Diagnosis present

## 2015-12-28 DIAGNOSIS — F22 Delusional disorders: Secondary | ICD-10-CM | POA: Diagnosis not present

## 2015-12-28 DIAGNOSIS — M79671 Pain in right foot: Secondary | ICD-10-CM | POA: Diagnosis not present

## 2015-12-28 DIAGNOSIS — Z79899 Other long term (current) drug therapy: Secondary | ICD-10-CM | POA: Insufficient documentation

## 2015-12-28 DIAGNOSIS — R0602 Shortness of breath: Secondary | ICD-10-CM | POA: Insufficient documentation

## 2015-12-28 DIAGNOSIS — Z7982 Long term (current) use of aspirin: Secondary | ICD-10-CM | POA: Diagnosis not present

## 2015-12-28 DIAGNOSIS — I2510 Atherosclerotic heart disease of native coronary artery without angina pectoris: Secondary | ICD-10-CM | POA: Insufficient documentation

## 2015-12-28 DIAGNOSIS — R443 Hallucinations, unspecified: Secondary | ICD-10-CM

## 2015-12-28 LAB — URINALYSIS, ROUTINE W REFLEX MICROSCOPIC
Bilirubin Urine: NEGATIVE
GLUCOSE, UA: NEGATIVE mg/dL
HGB URINE DIPSTICK: NEGATIVE
KETONES UR: NEGATIVE mg/dL
Leukocytes, UA: NEGATIVE
Nitrite: NEGATIVE
PROTEIN: NEGATIVE mg/dL
Specific Gravity, Urine: 1.009 (ref 1.005–1.030)
pH: 7 (ref 5.0–8.0)

## 2015-12-28 LAB — BASIC METABOLIC PANEL
Anion gap: 8 (ref 5–15)
BUN: 15 mg/dL (ref 6–20)
CO2: 23 mmol/L (ref 22–32)
Calcium: 9.4 mg/dL (ref 8.9–10.3)
Chloride: 105 mmol/L (ref 101–111)
Creatinine, Ser: 1.19 mg/dL (ref 0.61–1.24)
GFR calc Af Amer: >60 mL/min (ref >60)
GFR calc non Af Amer: >60 mL/min (ref >60)
Glucose, Bld: 120 mg/dL — ABNORMAL HIGH (ref 65–99)
Potassium: 3.8 mmol/L (ref 3.5–5.1)
Sodium: 136 mmol/L (ref 135–145)

## 2015-12-28 LAB — RAPID URINE DRUG SCREEN, HOSP PERFORMED
Amphetamines: NOT DETECTED
Barbiturates: NOT DETECTED
Benzodiazepines: NOT DETECTED
Cocaine: NOT DETECTED
OPIATES: POSITIVE — AB
Tetrahydrocannabinol: NOT DETECTED

## 2015-12-28 LAB — CBC
HCT: 38.7 % — ABNORMAL LOW (ref 39.0–52.0)
Hemoglobin: 13.4 g/dL (ref 13.0–17.0)
MCH: 29.2 pg (ref 26.0–34.0)
MCHC: 34.6 g/dL (ref 30.0–36.0)
MCV: 84.3 fL (ref 78.0–100.0)
Platelets: 259 10*3/uL (ref 150–400)
RBC: 4.59 MIL/uL (ref 4.22–5.81)
RDW: 15.2 % (ref 11.5–15.5)
WBC: 6.2 10*3/uL (ref 4.0–10.5)

## 2015-12-28 LAB — URIC ACID: URIC ACID, SERUM: 4.8 mg/dL (ref 4.4–7.6)

## 2015-12-28 LAB — D-DIMER, QUANTITATIVE (NOT AT ARMC)

## 2015-12-28 LAB — I-STAT TROPONIN, ED
TROPONIN I, POC: 0 ng/mL (ref 0.00–0.08)
Troponin i, poc: 0 ng/mL (ref 0.00–0.08)

## 2015-12-28 LAB — TSH: TSH: 2.12 u[IU]/mL (ref 0.350–4.500)

## 2015-12-28 MED ORDER — MORPHINE SULFATE (PF) 4 MG/ML IV SOLN
4.0000 mg | Freq: Once | INTRAVENOUS | Status: AC
  Administered 2015-12-28: 4 mg via INTRAVENOUS
  Filled 2015-12-28: qty 1

## 2015-12-28 MED ORDER — NITROGLYCERIN 0.4 MG SL SUBL
SUBLINGUAL_TABLET | SUBLINGUAL | Status: AC
  Administered 2015-12-28: 0.4 mg
  Filled 2015-12-28: qty 1

## 2015-12-28 NOTE — ED Notes (Signed)
MD at bedside. 

## 2015-12-28 NOTE — ED Notes (Signed)
PA at bedside.

## 2015-12-28 NOTE — ED Notes (Signed)
[facility] called for transport. 

## 2015-12-28 NOTE — BH Assessment (Addendum)
Assessment Note  Tyrone Noble is an 65 y.o. male.  He presented to [facility] for medical clearance. Patient medically evaluated by EDP. Family at patient's bedside have shared that patient is increasing confused with delusional thoughts. Patient sts that men have been trying to get into his home. He has called [employer] out to his home over 6x's in the past several days. Sts that [employer] have came and check out his home not finding anything that looks suspicious. Despite [employer] not finding any signs of intrusion patient believes that someone in trying to break in his home. Patient sts, "I have seen two men with gun outside of my home and heard the gun click". Patient left his home last night and drove around for hours ending up at his sisters home 0530 this morning. His sister let him in her home stating patient was mumbling about men outside the gun pointing at him. Sister sts that she looked outside and didn't see anything suspicious. Sts that patient became so anxious about the "men with guns" he started to have chest pain and that's what brought him to [facility].   Patient denies SI. He denies history of suicidal ideations/gestures. Patient reports a family history of Bipolar Disorder (2 brothers). He admits to depressive symptoms including loss of interest in usual pleasures, isolating self from others, and fatigue. Appetite is fair; no significant weight loss/gain. Sleep is poor. Patient has been without sleep for the past several days. Denies HI. No history of violent of aggressive behaviors. Denies AVH's. No psychiatrist or therapist. Patient reports 1 prior hospital admissions 7 or 8 yrs ago for alcoholism. Patient sts that he was a heavy drinker in the past. Patient's last drink was 7 or 8 yrs ago.       Diagnosis: Depressive Disorder, Recurrent, Severe, with psychotic features.    Past Medical History:  Past Medical History:  Diagnosis Date  . Coronary artery disease   . Depression   . Headache     "monthly" (02/16/2015)  . Hypertension   . Stroke [facility])    "years ago" (02/16/2015)    Past Surgical History:  Procedure Laterality Date  . CARDIAC CATHETERIZATION N/A 02/16/2015   Procedure: Left Heart Cath and Coronary Angiography;  Surgeon: Rinaldo Cloud, MD;  Location: [facility] INVASIVE CV LAB;  Service: Cardiovascular;  Laterality: N/A;  . CARDIAC CATHETERIZATION N/A 02/16/2015   Procedure: Coronary Stent Intervention;  Surgeon: Rinaldo Cloud, MD;  Location: [facility] INVASIVE CV LAB;  Service: Cardiovascular;  Laterality: N/A;  . CARDIAC CATHETERIZATION N/A 02/16/2015   Procedure: Intravascular Pressure Wire/FFR Study;  Surgeon: Rinaldo Cloud, MD;  Location: [facility] INVASIVE CV LAB;  Service: Cardiovascular;  Laterality: N/A;  . CORONARY ANGIOPLASTY    . LEFT HEART CATHETERIZATION WITH CORONARY ANGIOGRAM N/A 03/19/2012   Procedure: LEFT HEART CATHETERIZATION WITH CORONARY ANGIOGRAM;  Surgeon: Robynn Pane, MD;  Location: [facility] CATH LAB;  Service: Cardiovascular;  Laterality: N/A;    Family History: No family history on file.  Social History:  reports that he has never smoked. He has never used smokeless tobacco. He reports that he drinks alcohol. He reports that he does not use drugs.  Additional Social History:  Alcohol / Drug Use Pain Medications: SEE MAR Prescriptions: SEE MAR Over the Counter: SEE MAR History of alcohol / drug use?: Yes Substance #1 Name of Substance 1: Alcohol  1 - Age of First Use: 20's 1 - Amount (size/oz): heavy alcohol use 1 - Frequency: daily  1 - Duration:  on-going; yrs 1 - Last Use / Amount: 8 yrs ago  CIWA: CIWA-Ar BP: 159/88 Pulse Rate: 60 COWS:    Allergies:  Allergies  Allergen Reactions  . Penicillins Nausea And Vomiting    Has patient had a PCN reaction causing immediate rash, facial/tongue/throat swelling, SOB or lightheadedness with hypotension: No  Has patient had a PCN reaction causing severe rash involving mucus membranes or skin necrosis: No   Has patient had a PCN reaction that required hospitalization: No  Has patient had a PCN reaction occurring within the last 10 years: No  If all of the above answers are "NO", then may proceed with Cephalosporin use.    Home Medications:  (Not in a hospital admission)  OB/GYN Status:  No LMP for male patient.  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)  Medical Screening Exam [facility] Walk-in ONLY) Medical Exam completed:  (n/a)  Crisis Care Plan Living Arrangements: Alone Legal Guardian:  (no legal guardian/pt has a POA-daughter Armed forces technical officerAshley Kump) Name of Psychiatrist:  (no psychiatrist ) Name of Therapist:  (no therapist )  Education Status Is patient currently in school?: No Current Grade:  (n/a) Highest grade of school patient has completed:  (n/a) Name of school:  (n/a) Contact person:  (n/a)  Risk to self with the past 6 months Suicidal Ideation: No Has patient been a risk to self within the past 6 months prior to admission? : No Suicidal Intent: No Has patient had any suicidal intent within the past 6 months prior to admission? : No Is patient at risk for suicide?: No Suicidal Plan?: No Has patient had any suicidal plan within the past 6 months prior to admission? : No Access to Means: No What has been your use of drugs/alcohol within the last 12 months?:  (patient is calm and cooperative ) Previous Attempts/Gestures: No How many times?:  (0) Other Self Harm Risks:  n/a Intentional Self Injurious Behavior: None Family Suicide History: Yes (2 brothers- Bipolar Disorder and Depression) Recent stressful life event(s): Other (Comment) ("I miss working..I just sit in the house and watch tv") Persecutory voices/beliefs?: No Depression: Yes Depression Symptoms: Fatigue, Loss of interest in usual pleasures, Feeling angry/irritable, Feeling worthless/self pity, Guilt, Isolating Substance abuse history and/or treatment for substance abuse?: No Suicide prevention information given to non-admitted patients: Not applicable  Risk to Others within the past 6 months Homicidal Ideation: No Does patient have any lifetime risk of violence toward others beyond the six months prior to admission? : No Thoughts of Harm to Others: No Current Homicidal Intent: No Current Homicidal Plan: No Access to Homicidal Means: No Identified Victim:  (n/a) History of harm to others?: No Assessment of Violence: None Noted Violent Behavior Description:  (patient is calm and cooperative ) Does patient have access to weapons?: No Criminal Charges Pending?: No Does patient have a court date: No Is patient on probation?: No  Psychosis Hallucinations: None noted Delusions: Unspecified, Grandiose (Sts that 2 men are trying to break in his home and have guns)  Mental Status Report Appearance/Hygiene: In hospital gown Eye Contact: Good Motor  Activity: Freedom of movement Speech: Logical/coherent Level of Consciousness: Alert Mood: Depressed Affect: Appropriate to circumstance Anxiety Level: Moderate Thought Processes: Relevant, Circumstantial Judgement: Partial Orientation: Person, Time, Situation, Place Obsessive Compulsive Thoughts/Behaviors: None  Cognitive Functioning Concentration: Normal Memory: Recent Intact, Remote Intact IQ: Average Level of Function:  (n/a) Insight: Poor Impulse Control: Poor Appetite: Fair Weight Loss:  (n/a) Weight Gain:  (n/a) Sleep:  Decreased Total Hours of Sleep:  (patient has been driving around at night) Vegetative Symptoms: None  ADLScreening [facility] Assessment Services) Patient's cognitive ability adequate to safely complete daily activities?: Yes Patient able to express need for assistance with ADLs?: Yes Independently performs ADLs?: Yes (appropriate for developmental age)  Prior Inpatient Therapy Prior Inpatient Therapy: No Prior Therapy Dates:  (n/a) Prior Therapy Facilty/Provider(s):  (n/a) Reason for Treatment:  (n/a)  Prior Outpatient Therapy Prior Outpatient Therapy: No Prior Therapy Dates:  (n/a) Prior Therapy Facilty/Provider(s):  (n/a) Reason for Treatment:  (n/a) Does patient have an ACCT team?: No Does patient have Intensive In-House Services?  : No Does patient have [facility] services? : No Does patient have P4CC services?: No  ADL Screening (condition at time of admission) Patient's cognitive ability adequate to safely complete daily activities?: Yes Is the patient deaf or have difficulty hearing?: No Does the patient have difficulty seeing, even when wearing glasses/contacts?: No Patient able to express need for assistance with ADLs?: Yes Does the patient have difficulty dressing or bathing?: Yes Independently performs ADLs?: Yes (appropriate for developmental age) Does the patient have difficulty walking or climbing stairs?: No Weakness of Legs: None Weakness of Arms/Hands: None  Home Assistive Devices/Equipment Home Assistive Devices/Equipment: None    Abuse/Neglect Assessment (Assessment to be complete while patient is alone) Physical Abuse: Denies Verbal Abuse: Denies Sexual Abuse: Denies Exploitation of patient/patient's resources: Denies Self-Neglect: Denies Values / Beliefs Cultural Requests During Hospitalization: None Spiritual Requests During Hospitalization: None   Advance Directives (For Healthcare) Does patient have an advance directive?: No (Patient has a POA;  Marketing executive [phone]) Would patient like information on creating an advanced directive?: No - patient declined information Nutrition Screen- [facility] Patient's home diet: Regular  Additional Information 1:1 In Past 12 Months?: No CIRT Risk: No Elopement Risk: No Does patient have medical clearance?: Yes     Disposition:  Disposition Initial Assessment Completed for this Encounter: Yes Disposition of Patient: Inpatient treatment program (Patient meets criteria for [facility] placement) Type of inpatient treatment program: Adult (Per Nanine Means, DNP, patient meets criteria for [facility])  On Site Evaluation by:   Reviewed with Physician:    Melynda Ripple [facility] 12/28/2015 11:17 AM

## 2015-12-28 NOTE — ED Triage Notes (Signed)
Pt comes from home with complaints of right sided chest pain that began this morning.  Denies N&V and dizziness.  Has little complaints of shortness of breath.  Usually walks independently and needed assistance getting from the wheel chair to the bed on arrival. States he had a stent placed a few months ago.

## 2015-12-28 NOTE — BH Assessment (Signed)
[facility] Assessment Progress Note  Per Nanine MeansJamison Lord, DNP, this pt requires psychiatric hospitalization at this time.  At 14:50 Thayer OhmChris [facility]  Pt has been accepted to their facility by Ryder SystemSandy Hipple, NP to the [place] [facility], Rm 158-1.  This Clinical research associatewriter spoke to pt along with pt's daughter's, Morrie Sheldonshley and TransMontaigneammy Belitz; all are in agreement to transfer.  Please note that while Morrie Sheldonshley reports that she is pt's power of attorney, she does not have documentation on hand.  Nonetheless, pt agrees to sign consent for admission upon arrival at the facility.   EDP Raeford RazorStephen Kohut, MD, concurs with this decision as well.  Pt's nurse, Warden FillersLiza, has been notified, and agrees to call report to [phone].  Pt is to be transported via [place], and must arrive before 23:00 or after 06:00.  Doylene Canninghomas Emery Binz, MA Triage Specialist [phone]

## 2015-12-28 NOTE — ED Notes (Signed)
Per TTS. [facility] [place] [facility], room [facility]-1. Report to be called at [phone]. Accepting is Sandy Hipple NP.

## 2015-12-28 NOTE — ED Provider Notes (Signed)
[facility]-EMERGENCY DEPT Provider Note   CSN: [id number] Arrival date & time: 12/28/15  4098     History   Chief Complaint Chief Complaint  Patient presents with  . Chest Pain    HPI Tyrone Noble is a 65 y.o. male.  65 year old African-American male past medical history significant for CAD status post PCA 01/2015, hypertension that presents to the ED with right-sided chest pain that has been intermittent since this morning around 3AM. Patient states that he has been having sharp intermittent chest pain that lasts for approximately 10 minutes. States is nonexertional. It is nonpleuritic in nature. Patient denies any nausea or emesis episodes. The pain does not radiate. Patient also endorses mild shortness of breath on exertion and diaphoresis. Patient has tried nothing for the pain. Nothing makes better or worse. Patient denies any chest pain or shortness of breath at this time. Patient was given nitro in ED with relief of pain. Patient states that he saw Dr. Sharyn Lull with cardiology 11/2015. Patient also complains of right foot pain. States the pain started prior to arrival. Describes the pain as 10 out of 10. Patient states she is unable to move his right foot due to the pain. Patient without history of gout. Nothing makes better worse. Nothing for the pain.  Patient denies any fever, chills, headache, vision changes, abdominal pain, nausea, emesis, urinary symptoms, change in bowel habits, numbness or tingling.  Patient's daughter reports that patient has been more confused lately with delusional thoughts. He also patient states that he has been hearing voices and thinks that people are trying to break into his house. He is called [employer] several times the past several days. Patient states that he left his house last night injury around for approximately 4 hours. Patient states that he has been anxious about men with guns. Daughter is concerned with his increased delusional thoughts and confusion  and would like evaluation.  Patient states that he was anxious last night when he was retracting. He feels like the chest pain was brought on by his anxiety. Patient states that this chest pain is different from when he had stents placed last year.      Past Medical History:  Diagnosis Date  . Coronary artery disease   . Depression   . Headache    "monthly" (02/16/2015)  . Hypertension   . Stroke [facility])    "years ago" (02/16/2015)    Patient Active Problem List   Diagnosis Date Noted  . New-onset angina (HCC) 02/16/2015  . HYPERLIPIDEMIA 12/13/2009  . ALCOHOLISM 07/07/2009  . CALLUS, TOE 01/03/2007  . DEPRESSION, MAJOR, RECURRENT 05/17/2006  . TOBACCO DEPENDENCE 05/17/2006  . HYPERTENSION, BENIGN SYSTEMIC 05/17/2006  . HEMORRHOIDS, NOS 05/17/2006  . GASTROESOPHAGEAL REFLUX, NO ESOPHAGITIS 05/17/2006  . LEG PAIN OR KNEE PAIN 05/17/2006    Past Surgical History:  Procedure Laterality Date  . CARDIAC CATHETERIZATION N/A 02/16/2015   Procedure: Left Heart Cath and Coronary Angiography;  Surgeon: Rinaldo Cloud, MD;  [facility] INVAS[facility] CV LAB;  Service: Cardiovascular;  Laterality: N/A;  . CARDIAC CATHETERIZATION N/A 02/16/2015   Procedure: Coronary Stent Intervention;  Surgeon: Rinaldo Cloud, MD;  Location: [facility] INVASIVE CV LAB;  Service: Cardiovascular;  Laterality: N/A;  . CARDIAC CATHETERIZATION N/A 02/16/2015   Procedure: Intravascular Pressure Wire/FFR Study;  Surgeon: Rinaldo Cloud, MD;  Location: [facility] INVASIVE CV LAB;  Service: Cardiovascular;  Laterality: N/A;  . CORONARY ANGIOPLASTY    . LEFT HEART CATHETERIZATION WITH CORONARY ANGIOGRAM N/A 03/19/2012   Procedure:  LEFT HEART CATHETERIZATION WITH CORONARY ANGIOGRAM;  Surgeon: Robynn PaneMohan N Harwani, MD;  [facility];  Service: Cardiovascular;  Laterality: N/A;       Home Medications    Prior to Admission medications   Medication Sig Start Date End Date Taking? Authorizing Provider  amLODipine (NORVASC) 5 MG  tablet Take 1 tablet (5 mg total) by mouth daily. 02/17/15   Rinaldo CloudMohan Harwani, MD  amLODipine (NORVASC) 5 MG tablet Take 1 tablet (5 mg total) by mouth daily. 02/17/15   Rinaldo CloudMohan Harwani, MD  aspirin 81 MG chewable tablet Chew 1 tablet (81 mg total) by mouth daily. 02/17/15   Rinaldo CloudMohan Harwani, MD  atorvastatin (LIPITOR) 40 MG tablet Take 1 tablet (40 mg total) by mouth daily at 6 PM. 02/17/15   Rinaldo CloudMohan Harwani, MD  lisinopril (PRINIVIL,ZESTRIL) 20 MG tablet Take 1 tablet (20 mg total) by mouth daily. 08/28/14   Purvis SheffieldForrest Harrison, MD  lisinopril (PRINIVIL,ZESTRIL) 20 MG tablet Take 1 tablet (20 mg total) by mouth daily. 02/17/15   Rinaldo CloudMohan Harwani, MD  metoprolol (LOPRESSOR) 50 MG tablet Take 0.5 tablets (25 mg total) by mouth 2 (two) times daily. 08/28/14   Purvis SheffieldForrest Harrison, MD  nitroGLYCERIN (NITROSTAT) 0.4 MG SL tablet Place 1 tablet (0.4 mg total) under the tongue every 5 (five) minutes as needed for chest pain. 02/17/15   Rinaldo CloudMohan Harwani, MD  prasugrel (EFFIENT) 10 MG TABS tablet Take 1 tablet (10 mg total) by mouth daily. 02/17/15   Rinaldo CloudMohan Harwani, MD    Family History No family history on file.  Social History Social History  Substance Use Topics  . Smoking status: Never Smoker  . Smokeless tobacco: Never Used  . Alcohol use 0.0 oz/week     Comment: 02/16/2015 "last alcohol was in ~ 2012"     Allergies   Penicillins   Review of Systems Review of Systems  Constitutional: Negative for chills and fever.  HENT: Negative for congestion, ear pain, rhinorrhea and sore throat.   Eyes: Negative for pain and discharge.  Respiratory: Positive for shortness of breath. Negative for cough.   Cardiovascular: Positive for chest pain. Negative for palpitations and leg swelling.  Gastrointestinal: Negative for abdominal pain, diarrhea, nausea and vomiting.  Genitourinary: Negative for dysuria, flank pain, frequency, hematuria and urgency.  Musculoskeletal: Negative for myalgias and neck pain.  Neurological:  Negative for dizziness, syncope, weakness, light-headedness, numbness and headaches.  Psychiatric/Behavioral: Positive for confusion. Negative for suicidal ideas. The patient is nervous/anxious.   All other systems reviewed and are negative.    Physical Exam Updated Vital Signs BP 137/94   Pulse 91   Temp 98.2 F (36.8 C) (Oral)   Resp 20   SpO2 97%   Physical Exam  Constitutional: He is oriented to person, place, and time. He appears well-developed and well-nourished. No distress.  HENT:  Head: Normocephalic and atraumatic.  Mouth/Throat: Oropharynx is clear and moist.  Eyes: Conjunctivae are normal. Pupils are equal, round, and reactive to light. Right eye exhibits no discharge. Left eye exhibits no discharge. No scleral icterus.  Neck: Normal range of motion. Neck supple. No thyromegaly present.  Cardiovascular: Normal rate, regular rhythm, normal heart sounds and intact distal pulses.  Exam reveals no gallop and no friction rub.   No murmur heard. Pulmonary/Chest: Effort normal and breath sounds normal. No respiratory distress. He has no wheezes. He exhibits no tenderness.  Abdominal: Soft. Bowel sounds are normal. He exhibits no distension. There is no tenderness. There is no rebound and  no guarding.  Musculoskeletal:       Right ankle: He exhibits decreased range of motion. He exhibits no swelling, no ecchymosis, no deformity and normal pulse.  Severely tender to palpation over first metatarsal. No erythema, ecchymosis, edema, or deformity noted. Patient with decreased ROM due to pain. DP pulses 2+ bilaterally. Sensation intact.  No lower extremity edema bilterally.  Lymphadenopathy:    He has no cervical adenopathy.  Neurological: He is alert and oriented to person, place, and time. No cranial nerve deficit. Coordination normal.  Skin: Skin is warm and dry. Capillary refill takes less than 2 seconds.  Psychiatric: He has a normal mood and affect. His speech is normal and  behavior is normal. Thought content is paranoid. He expresses no suicidal ideation.  Nursing note and vitals reviewed.    ED Treatments / Results  Labs (all labs ordered are listed, but only abnormal results are displayed) Labs Reviewed  BASIC METABOLIC PANEL - Abnormal; Notable for the following:       Result Value   Glucose, Bld 120 (*)    All other components within normal limits  CBC - Abnormal; Notable for the following:    HCT 38.7 (*)    All other components within normal limits  RAPID URINE DRUG SCREEN, HOSP PERFORMED - Abnormal; Notable for the following:    Opiates POSITIVE (*)    All other components within normal limits  URIC ACID  URINALYSIS, ROUTINE W REFLEX MICROSCOPIC (NOT AT [facility])  D-DIMER, QUANTITATIVE (NOT AT [facility])  TSH  I-STAT TROPOININ, ED  I-STAT TROPOININ, ED    EKG  EKG Interpretation  Date/Time:  Tuesday December 28 2015 06:31:10 EDT Ventricular Rate:  78 PR Interval:    QRS Duration: 148 QT Interval:  418 QTC Calculation: 477 R Axis:   -74 Text Interpretation:  Sinus rhythm RBBB and LAFB Baseline wander in lead(s) V4 When compared with ECG of 02/17/2015, No significant change was found Confirmed by [facility]  MD, DAVID ([id number]) on 12/28/2015 6:35:44 AM       Radiology Dg Chest 2 View  Result Date: 12/28/2015 CLINICAL DATA:  New onset of right-sided chest pain yesterday ; recent coronary stent placement EXAM: CHEST  2 VIEW COMPARISON:  Chest x-ray of January 15, 2015 FINDINGS: The lungs are mildly hyperinflated. There is no focal infiltrate. There is no pleural effusion. The heart and pulmonary vascularity are normal. The mediastinum is normal in width. The bony thorax exhibits no acute abnormality. IMPRESSION: There is no acute cardiopulmonary abnormality. Mild hyperinflation may be voluntary or may reflect underlying reactive airway disease or COPD. Electronically Signed   By: David  [place] M.D.   On: 12/28/2015 07:44   Dg Foot Complete  Right  Result Date: 12/28/2015 CLINICAL DATA:  New onset right great toe pain over the past several days without known trauma. EXAM: RIGHT FOOT COMPLETE - 3+ VIEW COMPARISON:  None in PACs FINDINGS: The bones are subjectively adequately mineralized. There is no acute or healing fracture. There is a mild hallux valgus contour of the first ray with mild degenerative change of the first MTP joint. The interphalangeal and other metatarsophalangeal joint spaces are unremarkable. The tarsometatarsal and intertarsal joint spaces are preserved. There is a tiny plantar calcaneal spur. There is spurring arising from the distal aspect of the talus along its dorsal surface. IMPRESSION: Hallux valgus contour of the first ray. Mild degenerative changes of the first MTP joint without significant joint space loss. No acute bony abnormality. Electronically  Signed   By: David  [place] M.D.   On: 12/28/2015 08:00    Procedures Procedures (including critical care time)  Medications Ordered in ED Medications  nitroGLYCERIN (NITROSTAT) 0.4 MG SL tablet (0.4 mg  Given 12/28/15 0702)  morphine 4 MG/ML injection 4 mg (4 mg Intravenous Given 12/28/15 0800)     Initial Impression / Assessment and Plan / ED Course  I have reviewed the triage vital signs and the nursing notes.  Pertinent labs & imaging results that were available during my care of the patient were reviewed by me and considered in my medical decision making (see chart for details).  Clinical Course  Patient presented to ED with right-sided chest pain and right foot pain. On initial evaluation patient denied any chest pain at this time. EKG was without acute changes. Chest x-ray was unremarkable for acute changes. Delta troponins were negative. D-dimer was negative. Patient complaining of right foot pain. Very tender to palpation. X-ray of the foot was unremarkable for acute changes. Uric acid levels were normal. Exam was unconcerning for acute gout attack.  On reevaluation with Dr. Juleen China daughter voiced concerns for patient's confusion and paranoia. UA was performed without evidence of UTI. Urine drugs he was positive for opiates but patient was given morphine in ED. TTS was consulted for psych eval. I believe that chest pain was likely due to anxiety last night. Chest pain workup has been unremarkable today. Low suspicion for ACS or PE. Vital signs have been stable. Patient is afebrile and not tachycardic. Patient was without chest pain or foot pain on reevaluation and states he feels extremely better. All other labs are unremarkable. I believe patient will need inpatient treatment for acute paranoia and psychosis. Confusion likely not due to infection. Patient will need follow-up with cardiologist if chest pain symptoms continue. Patient and family are agreeable to above plan. Patient was seen and evaluated with Dr. Juleen China who agrees with the above plan. From a medical standpoint patient is medically cleared. Awaiting TTS recommendations. Patient given food tray and is eating.   Final Clinical Impressions(s) / ED Diagnoses   Final diagnoses:  Chest pain, unspecified type  Hallucination  Paranoia [facility])    New Prescriptions New Prescriptions   No medications on file     Rise Mu, PA-C 12/28/15 1408    Raeford Razor, MD 12/29/15 1240

## 2015-12-28 NOTE — [facility] Assessment (Signed)
[facility] Assessment Progress Note  Per Nanine MeansJamison Lord, DNP, this pt requires psychiatric hospitalization at this time.  The following facilities have been contacted to seek placement for this pt, with results as noted:  Beds available, information sent, decision pending:  [place] [facility] [facility] [place]   At capacity:  [facility]    Travoris Bushey, [place] Triage Specialist [phone]

## 2016-02-16 ENCOUNTER — Ambulatory Visit (INDEPENDENT_AMBULATORY_CARE_PROVIDER_SITE_OTHER): Payer: Medicare HMO | Admitting: Diagnostic Neuroimaging

## 2016-02-16 ENCOUNTER — Encounter: Payer: Self-pay | Admitting: *Deleted

## 2016-02-16 VITALS — BP 130/72 | HR 67 | Ht 67.0 in | Wt 152.0 lb

## 2016-02-16 DIAGNOSIS — F0391 Unspecified dementia with behavioral disturbance: Secondary | ICD-10-CM | POA: Diagnosis not present

## 2016-02-16 DIAGNOSIS — R413 Other amnesia: Secondary | ICD-10-CM | POA: Diagnosis not present

## 2016-02-16 DIAGNOSIS — F03B18 Unspecified dementia, moderate, with other behavioral disturbance: Secondary | ICD-10-CM

## 2016-02-16 DIAGNOSIS — F1011 Alcohol abuse, in remission: Secondary | ICD-10-CM

## 2016-02-16 NOTE — Patient Instructions (Signed)

## 2016-02-16 NOTE — Progress Notes (Signed)
[facility]  PATIENT: Tyrone Noble DOB: 1951/01/07  REFERRING CLINICIAN: D [name] HISTORY FROM: patient and daughter Celine Mans) REASON FOR VISIT: new consult    HISTORICAL  CHIEF COMPLAINT:  Chief Complaint  Patient presents with  . Memory Loss    rm 7, New pt, dgtr- Sonja, MMSE 18    HISTORY OF PRESENT ILLNESS:   65 year old right-handed male with hypertension, heart disease, depression, here for evaluation of confusion and memory loss.  In October 2017 patient was having increasing confusional episodes, delusional thoughts, thinking that people were trying to break into his house. He thought the people with guns were going to come and harm him. One day he started driving around his neighborhood and ended up at his sister's house. Due to confused appearance, also complaint of chest pain, patient was taken to the emergency room on 12/28/15.  Patient was then evaluated and transferred to psychiatric hospital for treatment. Since that time patient is slightly doing better. He is now living with his daughter. He has had a few episodes of continued confusion, mixing up day and night, feeling paranoid that drug dealers were trying to break into his home.  Previously he was living alone. Patient has history of alcohol abuse but apparently in remission for several years. In retrospect over the past 6-12 months patient has been having increasing short-term memory problems, cognitive decline, repeating himself, mixing up his medications.    REVIEW OF SYSTEMS: Full 14 system review of systems performed and negative with exception of: Memory loss confusion.  ALLERGIES: Allergies  Allergen Reactions  . Penicillins Nausea And Vomiting    Has patient had a PCN reaction causing immediate rash, facial/tongue/throat swelling, SOB or lightheadedness with hypotension: No  Has patient had a PCN reaction causing severe rash involving mucus membranes or skin necrosis: No    Has patient had a PCN reaction that required hospitalization: No  Has patient had a PCN reaction occurring within the last 10 years: No  If all of the above answers are "NO", then may proceed with Cephalosporin use.    HOME MEDICATIONS: Outpatient Medications Prior to Visit  Medication Sig Dispense Refill  . amLODipine (NORVASC) 5 MG tablet Take 1 tablet (5 mg total) by mouth daily. 30 tablet 0  . aspirin 81 MG chewable tablet Chew 1 tablet (81 mg total) by mouth daily. 30 tablet 3  . clopidogrel (PLAVIX) 75 MG tablet Take 75 mg by mouth daily.    Marland Kitchen lisinopril (PRINIVIL,ZESTRIL) 20 MG tablet Take 1 tablet (20 mg total) by mouth daily. 30 tablet 0  . metoprolol succinate (TOPROL-XL) 25 MG 24 hr tablet Take 25 mg by mouth daily.    . nitroGLYCERIN (NITROSTAT) 0.4 MG SL tablet Place 1 tablet (0.4 mg total) under the tongue every 5 (five) minutes as needed for chest pain. (Patient not taking: Reported on 02/16/2016) 25 tablet 12  . amLODipine (NORVASC) 5 MG tablet Take 1 tablet (5 mg total) by mouth daily. (Patient not taking: Reported on 12/28/2015) 30 tablet 3  . atorvastatin (LIPITOR) 40 MG tablet Take 1 tablet (40 mg total) by mouth daily at 6 PM. 30 tablet 3  . lisinopril (PRINIVIL,ZESTRIL) 20 MG tablet Take 1 tablet (20 mg total) by mouth daily. (Patient not taking: Reported on 12/28/2015) 30 tablet 0  . metoprolol (LOPRESSOR) 50 MG tablet Take 0.5 tablets (25 mg total) by mouth 2 (two) times daily. (Patient not taking: Reported on 12/28/2015) 30 tablet 0  . prasugrel (EFFIENT)  10 MG TABS tablet Take 1 tablet (10 mg total) by mouth daily. (Patient not taking: Reported on 12/28/2015) 30 tablet 11   No facility-administered medications prior to visit.     PAST MEDICAL HISTORY: Past Medical History:  Diagnosis Date  . Coronary artery disease   . Depression   . Headache    "monthly" (02/16/2015)  . Heart disease   . Hypertension   . Stroke [facility](HCC)    "years ago" (02/16/2015)    PAST  SURGICAL HISTORY: Past Surgical History:  Procedure Laterality Date  . CARDIAC CATHETERIZATION N/A 02/16/2015   Procedure: Left Heart Cath and Coronary Angiography;  Surgeon: Rinaldo CloudMohan Harwani, MD;  [facility];  Service: Cardiovascular;  Laterality: N/A;  . CARDIAC CATHETERIZATION N/A 02/16/2015   Procedure: Coronary Stent Intervention;  Surgeon: Rinaldo CloudMohan Harwani, MD;  Location: [facility] INVAS[facility] CV LAB;  Service: Cardiovascular;  Laterality: N/A;  . CARDIAC CATHETERIZATION N/A 02/16/2015   Procedure: Intravascular Pressure Wire/FFR Study;  Surgeon: Rinaldo CloudMohan Harwani, MD;  [facility];  Service: Cardiovascular;  Laterality: N/A;  . CORONARY ANGIOPLASTY    . LEFT HEART CATHETERIZATION WITH CORONARY ANGIOGRAM N/A 03/19/2012   Procedure: LEFT HEART CATHETERIZATION WITH CORONARY ANGIOGRAM;  Surgeon: Robynn PaneMohan N Harwani, MD;  [facility];  Service: Cardiovascular;  Laterality: N/A;  . WISDOM TOOTH EXTRACTION      FAMILY HISTORY: Family History  Problem Relation Age of Onset  . Diabetes Mother   . Hypertension Mother   . Stroke Father   . Cancer Sister     SOCIAL HISTORY:  Social History   Social History  . Marital status: Divorced    Spouse name: N/A  . Number of children: 2  . Years of education: 12   Occupational History  .      retired Naval architecttruck driver   Social History Main Topics  . Smoking status: Former Games developermoker  . Smokeless tobacco: Never Used     Comment: quit many years ago  . Alcohol use 0.0 oz/week     Comment: 02/16/2015 "last alcohol was in ~ 2012"  . Drug use: No  . Sexual activity: Not Currently   Other Topics Concern  . Not on file   Social History Narrative   Lives with daughter    caffeine -coffee,  1 cup daily     PHYSICAL EXAM  GENERAL EXAM/CONSTITUTIONAL: Vitals:  Vitals:   02/16/16 1045  BP: 130/72  Pulse: 67  Weight: 152 lb (68.9 kg)  Height: 5\' 7"  (1.702 m)     Body mass index is 23.81 kg/m.  Visual Acuity  Screening   Right eye Left eye Both eyes  Without correction:     With correction: 20/20 20/30      Patient is in no distress; well developed, nourished and groomed; neck is supple  CARDIOVASCULAR:  Examination of carotid arteries is normal; no carotid bruits  Regular rate and rhythm, no murmurs  Examination of peripheral vascular system by observation and palpation is normal  EYES:  Ophthalmoscopic exam of optic discs and posterior segments is normal; no papilledema or hemorrhages  MUSCULOSKELETAL:  Gait, strength, tone, movements noted in Neurologic exam below  NEUROLOGIC: MENTAL STATUS:  MMSE - Mini Mental State Exam 02/16/2016  Orientation to time 2  Orientation to Place 5  Registration 3  Attention/ Calculation 0  Recall 0  Language- name 2 objects 2  Language- repeat 1  Language- follow 3 step command 3  Language- read &  follow direction 1  Write a sentence 1  Copy design 0  Total score 18    awake, alert, oriented to person, place and time  recent and remote memory intact  normal attention and concentration  language fluent, comprehension intact, naming intact,   fund of knowledge appropriate  CRANIAL NERVE:   2nd - no papilledema on fundoscopic exam  2nd, 3rd, 4th, 6th - pupils equal and reactive to light, visual fields full to confrontation, extraocular muscles intact, no nystagmus  5th - facial sensation symmetric  7th - facial strength symmetric  8th - hearing intact  9th - palate elevates symmetrically, uvula midline  11th - shoulder shrug symmetric  12th - tongue protrusion midline  MOTOR:   normal bulk and tone, full strength in the BUE, BLE  SENSORY:   normal and symmetric to light touch, temperature, vibration  COORDINATION:   finger-nose-finger, fine finger movements normal  REFLEXES:   deep tendon reflexes BRISK and symmetric  POSITIVE SNOUT, MYERSON AND PALMOMENTAL REFLEXES  GAIT/STATION:   narrow based gait;  STOOPED POSTURE; SLOW GAIT; able to walk tandem; romberg is negative    DIAGNOSTIC DATA (LABS, IMAGING, TESTING) - I reviewed patient records, labs, notes, testing and imaging myself where available.  Lab Results  Component Value Date   WBC 6.2 12/28/2015   HGB 13.4 12/28/2015   HCT 38.7 (L) 12/28/2015   MCV 84.3 12/28/2015   PLT 259 12/28/2015      Component Value Date/Time   NA 136 12/28/2015 0653   K 3.8 12/28/2015 0653   CL 105 12/28/2015 0653   CO2 23 12/28/2015 0653   GLUCOSE 120 (H) 12/28/2015 0653   BUN 15 12/28/2015 0653   CREATININE 1.19 12/28/2015 0653   CALCIUM 9.4 12/28/2015 0653   PROT 6.9 01/15/2015 0941   ALBUMIN 3.9 01/15/2015 0941   AST 25 01/15/2015 0941   ALT 18 01/15/2015 0941   ALKPHOS 79 01/15/2015 0941   BILITOT 0.8 01/15/2015 0941   GFRNONAA >60 12/28/2015 0653   GFRAA >60 12/28/2015 0653   Lab Results  Component Value Date   CHOL 186 03/18/2012   HDL 72 03/18/2012   LDLCALC 105 (H) 03/18/2012   TRIG 43 03/18/2012   CHOLHDL 2.6 03/18/2012   No results found for: HGBA1C No results found for: VITAMINB12 Lab Results  Component Value Date   TSH 2.120 12/28/2015    03/26/11 CT head [I reviewed images myself and agree with interpretation. -VRP] - White matter changes.  No acute intracranial abnormality identified.     ASSESSMENT AND PLAN  65 y.o. year old male here with history of alcohol abuse, now in remission, with recent behavioral disturbance including paranoia and delusion, increasing short-term memory problems, raising possibility of superimposed neurodegenerative dementia or other primary psychiatric disorder. Will check further evaluation to rule out neurologic/organic causes of symptoms.   Ddx: dementia (alcohol, neurodegenerative, vascular, metabolic)  1. Memory loss   2. Moderate dementia with behavioral disturbance   3. Alcohol abuse, in remission      PLAN: - check MRI brain and labs - continue lexapro for  depression - continue follow up with [facility]/psychiatry in future - home palliative care consult (for advanced care planning) - may consider memantine or donepezil in future  Orders Placed This Encounter  Procedures  . MR BRAIN WO CONTRAST  . Vitamin B12  . Hemoglobin A1c  . Amb Referral to Palliative Care   Return in about 3 months (around 05/17/2016).  Suanne MarkerVIKRAM R. Audine Mangione, MD 02/16/2016, 11:17 AM Certified in Neurology, Neurophysiology and Neuroimaging  [facility] [address], [address] [place], [place] [id number] [phone]

## 2016-02-17 LAB — VITAMIN B12: Vitamin B-12: 402 pg/mL (ref 211–946)

## 2016-02-17 LAB — HEMOGLOBIN A1C
Est. average glucose Bld gHb Est-mCnc: 114 mg/dL
Hgb A1c MFr Bld: 5.6 % (ref 4.8–5.6)

## 2016-02-18 ENCOUNTER — Telehealth: Payer: Self-pay | Admitting: *Deleted

## 2016-02-18 NOTE — Telephone Encounter (Signed)
Attempted to reach daughter, Celine MansSonja with her father's lab results of normal. No answer, voice mailbox not set up.

## 2016-02-22 NOTE — Telephone Encounter (Signed)
Attempted to reach daughter again to advise patient's lab results are normal. Unable to LVM, mailbox not set up.

## 2016-02-23 ENCOUNTER — Telehealth: Payer: Self-pay | Admitting: *Deleted

## 2016-02-23 NOTE — Telephone Encounter (Signed)
Third unsuccessful attempt to reach daughter, Celine MansSonja with normal lab results.  Phone not answered, voice mailbox not set up.

## 2016-03-05 ENCOUNTER — Ambulatory Visit
Admission: RE | Admit: 2016-03-05 | Discharge: 2016-03-05 | Disposition: A | Discharge status: Home / Self Care | Payer: Medicare HMO | Source: Ambulatory Visit | Attending: Diagnostic Neuroimaging | Admitting: Diagnostic Neuroimaging

## 2016-03-05 DIAGNOSIS — F1011 Alcohol abuse, in remission: Secondary | ICD-10-CM

## 2016-03-05 DIAGNOSIS — F0391 Unspecified dementia with behavioral disturbance: Secondary | ICD-10-CM | POA: Diagnosis not present

## 2016-03-05 DIAGNOSIS — R413 Other amnesia: Secondary | ICD-10-CM | POA: Diagnosis not present

## 2016-03-05 DIAGNOSIS — F03B18 Unspecified dementia, moderate, with other behavioral disturbance: Secondary | ICD-10-CM

## 2016-03-09 ENCOUNTER — Telehealth: Payer: Self-pay | Admitting: *Deleted

## 2016-03-09 NOTE — Telephone Encounter (Signed)
Per Dr Marjory LiesPenumalli, called daughter, Celine MansSonja who is on DPR and informed her that his MRI brain findings are consistent with progressive vascular disease and dementia diagnosis. There are no other major findings. Dr Marjory LiesPenumalli will continue with his current treatment plan. Reviewed his office visit note plan with Sonja.  She inquired if he may drive. Advised her that Dr Marjory LiesPenumalli strongly recommends he not drive due to his symptoms and dementia.  Confirmed his follow up in Feb. She verbalized understanding, appreciation.

## 2016-05-03 ENCOUNTER — Ambulatory Visit (INDEPENDENT_AMBULATORY_CARE_PROVIDER_SITE_OTHER): Payer: Medicare HMO | Admitting: Diagnostic Neuroimaging

## 2016-05-03 ENCOUNTER — Encounter: Payer: Self-pay | Admitting: Diagnostic Neuroimaging

## 2016-05-03 VITALS — BP 131/73 | HR 61 | Wt 163.8 lb

## 2016-05-03 DIAGNOSIS — F0391 Unspecified dementia with behavioral disturbance: Secondary | ICD-10-CM | POA: Diagnosis not present

## 2016-05-03 DIAGNOSIS — F03B18 Unspecified dementia, moderate, with other behavioral disturbance: Secondary | ICD-10-CM

## 2016-05-03 DIAGNOSIS — F1011 Alcohol abuse, in remission: Secondary | ICD-10-CM

## 2016-05-03 MED ORDER — DONEPEZIL HCL 10 MG PO TABS: 10.0000 mg | ORAL_TABLET | Freq: Every day | ORAL | 12 refills | Status: DC

## 2016-05-03 MED ORDER — DONEPEZIL HCL 5 MG PO TABS: 5.0000 mg | ORAL_TABLET | Freq: Every day | ORAL | 0 refills | Status: DC

## 2016-05-03 MED ORDER — MEMANTINE HCL 10 MG PO TABS: 10.0000 mg | ORAL_TABLET | Freq: Two times a day (BID) | ORAL | 12 refills | Status: DC

## 2016-05-03 NOTE — Progress Notes (Signed)
[facility]  PATIENT: Tyrone Noble DOB: April 27, 1950  REFERRING CLINICIAN: D [name] HISTORY FROM: patient and daughter Morrie Sheldon) REASON FOR VISIT: follow up     HISTORICAL  CHIEF COMPLAINT:  Chief Complaint  Patient presents with  . Memory Loss    rm 6, dgtrMorrie Sheldon, MMSE 17, wants MRI results  . Follow-up    3 month    HISTORY OF PRESENT ILLNESS:   UPDATE 05/03/16: Since last visit, doing about the same. No new issues. No hallucinations. Staying active. Wants to drive and live independently, but family is concerned about safety.  PRIOR HPI 02/16/16: 66 year old right-handed male with hypertension, heart disease, depression, here for evaluation of confusion and memory loss. In October 2017 patient was having increasing confusional episodes, delusional thoughts, thinking that people were trying to break into his house. He thought the people with guns were going to come and harm him. One day he started driving around his neighborhood and ended up at his sister's house. Due to confused appearance, also complaint of chest pain, patient was taken to the emergency room on 12/28/15. Patient was then evaluated and transferred to psychiatric hospital for treatment. Since that time patient is slightly doing better. He is now living with his daughter. He has had a few episodes of continued confusion, mixing up day and night, feeling paranoid that drug dealers were trying to break into his home. Previously he was living alone. Patient has history of alcohol abuse but apparently in remission for several years. In retrospect over the past 6-12 months patient has been having increasing short-term memory problems, cognitive decline, repeating himself, mixing up his medications.   REVIEW OF SYSTEMS: Full 14 system review of systems performed and negative with exception of: Memory loss confusion.  ALLERGIES: Allergies  Allergen Reactions  . Penicillins Nausea And Vomiting   Has patient had a PCN reaction causing immediate rash, facial/tongue/throat swelling, SOB or lightheadedness with hypotension: No  Has patient had a PCN reaction causing severe rash involving mucus membranes or skin necrosis: No  Has patient had a PCN reaction that required hospitalization: No  Has patient had a PCN reaction occurring within the last 10 years: No  If all of the above answers are "NO", then may proceed with Cephalosporin use.    HOME MEDICATIONS: Outpatient Medications Prior to Visit  Medication Sig Dispense Refill  . amLODipine (NORVASC) 5 MG tablet Take 1 tablet (5 mg total) by mouth daily. 30 tablet 0  . aspirin 81 MG chewable tablet Chew 1 tablet (81 mg total) by mouth daily. 30 tablet 3  . clopidogrel (PLAVIX) 75 MG tablet Take 75 mg by mouth daily.    Marland Kitchen escitalopram (LEXAPRO) 10 MG tablet 10 mg daily.    Marland Kitchen lisinopril (PRINIVIL,ZESTRIL) 20 MG tablet Take 1 tablet (20 mg total) by mouth daily. 30 tablet 0  . metoprolol succinate (TOPROL-XL) 25 MG 24 hr tablet Take 25 mg by mouth daily.    . Multiple Vitamins-Minerals (MULTIVITAMIN WITH MINERALS) tablet Take 1 tablet by mouth daily.    . nitroGLYCERIN (NITROSTAT) 0.4 MG SL tablet Place 1 tablet (0.4 mg total) under the tongue every 5 (five) minutes as needed for chest pain. 25 tablet 12   No facility-administered medications prior to visit.     PAST MEDICAL HISTORY: Past Medical History:  Diagnosis Date  . Coronary artery disease   . Depression   . Headache    "monthly" (02/16/2015)  . Heart disease   . Hypertension   .  Memory loss   . Stroke [facility](HCC)    "years ago" (02/16/2015)    PAST SURGICAL HISTORY: Past Surgical History:  Procedure Laterality Date  . CARDIAC CATHETERIZATION N/A 02/16/2015   Procedure: Left Heart Cath and Coronary Angiography;  Surgeon: Rinaldo CloudMohan Harwani, MD;  [facility];  Service: Cardiovascular;  Laterality: N/A;  . CARDIAC CATHETERIZATION N/A 02/16/2015   Procedure:  Coronary Stent Intervention;  Surgeon: Rinaldo CloudMohan Harwani, MD;  Location: [facility] INVASIVE CV LAB;  Service: Cardiovascular;  Laterality: N/A;  . CARDIAC CATHETERIZATION N/A 02/16/2015   Procedure: Intravascular Pressure Wire/FFR Study;  Surgeon: Rinaldo CloudMohan Harwani, MD;  [facility];  Service: Cardiovascular;  Laterality: N/A;  . CORONARY ANGIOPLASTY    . LEFT HEART CATHETERIZATION WITH CORONARY ANGIOGRAM N/A 03/19/2012   Procedure: LEFT HEART CATHETERIZATION WITH CORONARY ANGIOGRAM;  Surgeon: Robynn PaneMohan N Harwani, MD;  [facility];  Service: Cardiovascular;  Laterality: N/A;  . WISDOM TOOTH EXTRACTION      FAMILY HISTORY: Family History  Problem Relation Age of Onset  . Diabetes Mother   . Hypertension Mother   . Stroke Father   . Cancer Sister     SOCIAL HISTORY:  Social History   Social History  . Marital status: Divorced    Spouse name: N/A  . Number of children: 2  . Years of education: 12   Occupational History  .      retired Naval architecttruck driver   Social History Main Topics  . Smoking status: Former Games developermoker  . Smokeless tobacco: Never Used     Comment: quit many years ago  . Alcohol use 0.0 oz/week     Comment: 02/16/2015 "last alcohol was in ~ 2012"  . Drug use: No  . Sexual activity: Not Currently   Other Topics Concern  . Not on file   Social History Narrative   Lives with daughter    caffeine -coffee,  1 cup daily     PHYSICAL EXAM  GENERAL EXAM/CONSTITUTIONAL: Vitals:  Vitals:   05/03/16 1324  BP: 131/73  Pulse: 61  Weight: 163 lb 12.8 oz (74.3 kg)   Body mass index is 25.65 kg/m. No exam data present  Patient is in no distress; well developed, nourished and groomed; neck is supple  CARDIOVASCULAR:  Examination of carotid arteries is normal; no carotid bruits  Regular rate and rhythm, no murmurs  Examination of peripheral vascular system by observation and palpation is normal  EYES:  Ophthalmoscopic exam of optic discs and  posterior segments is normal; no papilledema or hemorrhages  MUSCULOSKELETAL:  Gait, strength, tone, movements noted in Neurologic exam below  NEUROLOGIC: MENTAL STATUS:  MMSE - Mini Mental State Exam 05/03/2016 02/16/2016  Orientation to time 3 2  Orientation to Place 5 5  Registration 3 3  Attention/ Calculation 0 0  Recall 1 0  Language- name 2 objects 2 2  Language- repeat 0 1  Language- follow 3 step command 2 3  Language- follow 3 step command-comments did not fold paper -  Language- read & follow direction 1 1  Write a sentence 0 1  Copy design 0 0  Total score 17 18    awake, alert, oriented to person, place and time  recent and remote memory intact  normal attention and concentration  language fluent, comprehension intact, naming intact,   fund of knowledge appropriate  CRANIAL NERVE:   2nd - no papilledema on fundoscopic exam  2nd, 3rd, 4th, 6th - pupils equal and  reactive to light, visual fields full to confrontation, extraocular muscles intact, no nystagmus  5th - facial sensation symmetric  7th - facial strength symmetric  8th - hearing intact  9th - palate elevates symmetrically, uvula midline  11th - shoulder shrug symmetric  12th - tongue protrusion midline  MOTOR:   normal bulk and tone, full strength in the BUE, BLE  SENSORY:   normal and symmetric to light touch, temperature, vibration  COORDINATION:   finger-nose-finger, fine finger movements normal  REFLEXES:   deep tendon reflexes BRISK and symmetric  GAIT/STATION:   narrow based gait; STOOPED POSTURE; SLOW GAIT; CANNOT TANDEM    DIAGNOSTIC DATA (LABS, IMAGING, TESTING) - I reviewed patient records, labs, notes, testing and imaging myself where available.  Lab Results  Component Value Date   WBC 6.2 12/28/2015   HGB 13.4 12/28/2015   HCT 38.7 (L) 12/28/2015   MCV 84.3 12/28/2015   PLT 259 12/28/2015      Component Value Date/Time   NA 136 12/28/2015 0653   K  3.8 12/28/2015 0653   CL 105 12/28/2015 0653   CO2 23 12/28/2015 0653   GLUCOSE 120 (H) 12/28/2015 0653   BUN 15 12/28/2015 0653   CREATININE 1.19 12/28/2015 0653   CALCIUM 9.4 12/28/2015 0653   PROT 6.9 01/15/2015 0941   ALBUMIN 3.9 01/15/2015 0941   AST 25 01/15/2015 0941   ALT 18 01/15/2015 0941   ALKPHOS 79 01/15/2015 0941   BILITOT 0.8 01/15/2015 0941   GFRNONAA >60 12/28/2015 0653   GFRAA >60 12/28/2015 0653   Lab Results  Component Value Date   CHOL 186 03/18/2012   HDL 72 03/18/2012   LDLCALC 105 (H) 03/18/2012   TRIG 43 03/18/2012   CHOLHDL 2.6 03/18/2012   Lab Results  Component Value Date   HGBA1C 5.6 02/16/2016   Lab Results  Component Value Date   VITAMINB12 402 02/16/2016   Lab Results  Component Value Date   TSH 2.120 12/28/2015    03/26/11 CT head [I reviewed images myself and agree with interpretation. -VRP] - White matter changes.  No acute intracranial abnormality identified.  03/05/16 MRI brain without contrast showing the following: 1.    Chronic lacunar infarctions in the pons and the right frontal lobe and widespread chronic microvascular ischemic changes.    The lacunar infarctions were present in 2006 but the extent of chronic microvascular ischemic changes increased. 2.    There are about a dozen microhemorrhages in the cerebellum and in the left occipital lobe. Although nonspecific, this could be seen with amyloid angiopathy.   These were not present in 2006. 3.    Chronic maxillary, ethmoid and sphenoid sinusitis. 4.    There are no acute findings.    ASSESSMENT AND PLAN  66 y.o. year old male here with history of alcohol abuse, now in remission, with recent behavioral disturbance including paranoia and delusion, increasing short-term memory problems, raising possibility of superimposed neurodegenerative dementia or other primary psychiatric disorder. Will check further evaluation to rule out neurologic/organic causes of symptoms.   Dx:  dementia (combination of alcohol, neurodegenerative, vascular)  1. Moderate dementia with behavioral disturbance   2. Alcohol abuse, in remission      PLAN:  - start dementia medications: - start donepezil 5mg  daily; after 2-4 weeks, then increase to donepezil 10mg  daily - after 2-4 weeks, start memantine 10mg  daily; after 2-4 weeks increase to memantine 10mg  twice a day  - continue lexapro for depression  -  continue follow up with [facility]/psychiatry in future  Meds ordered this encounter  Medications  . donepezil (ARICEPT) 5 MG tablet    Sig: Take 1 tablet (5 mg total) by mouth at bedtime.    Dispense:  30 tablet    Refill:  0  . donepezil (ARICEPT) 10 MG tablet    Sig: Take 1 tablet (10 mg total) by mouth at bedtime.    Dispense:  30 tablet    Refill:  12  . memantine (NAMENDA) 10 MG tablet    Sig: Take 1 tablet (10 mg total) by mouth 2 (two) times daily.    Dispense:  60 tablet    Refill:  12   Return in about 6 months (around 10/31/2016).    Suanne Marker, MD 05/03/2016, 1:44 PM Certified in Neurology, Neurophysiology and Neuroimaging  [facility] [address] [place], [place] [address] [phone]

## 2016-05-03 NOTE — Patient Instructions (Signed)
-   start donepezil 5mg  daily; after 2-4 weeks, then increase to donepezil 10mg  daily  - after 2-4 weeks, start memantine 10mg  daily; after 2-4 weeks increase to memantine 10mg  twice a day

## 2016-05-18 ENCOUNTER — Telehealth: Payer: Self-pay | Admitting: Diagnostic Neuroimaging

## 2016-05-18 NOTE — Telephone Encounter (Signed)
Sonya patient's daughter is calling to discuss dosage for medication donepezil (ARICEPT) 5 MG tablet, donepezil (ARICEPT) 10 MG tablet and memantine (NAMENDA) 10 MG tablet.  She is unclear about dosages.

## 2016-05-18 NOTE — Telephone Encounter (Signed)
Attempted to return daughter's call. No answer and voice mailbox not set up.

## 2016-05-19 NOTE — Telephone Encounter (Signed)
Attempted to reach daughter again with medication instructions per Dr Richrd HumblesPenumalli's plan in last office visit note. No answer, voice mailbox not set up. Will have to try and reach next week, office now closed.

## 2016-05-22 NOTE — Telephone Encounter (Signed)
Pt returned RN's call °

## 2016-05-22 NOTE — Telephone Encounter (Signed)
Reached daughter, [name] and advised her that her sister, Ashleywas given AVS in Feb with medication instructions. [name] stated she had lost the paper. This RN stated she had to write down instructions.   This RN gave her specific directions per Dr Richrd HumblesPenumalli's office note on 05/03/16. Sonya repeated all directions correctly, stated she had no other questions. She verbalized appreciation for call.

## 2016-06-04 ENCOUNTER — Other Ambulatory Visit: Payer: Self-pay | Admitting: Diagnostic Neuroimaging

## 2016-06-26 ENCOUNTER — Telehealth: Payer: Self-pay | Admitting: Diagnostic Neuroimaging

## 2016-06-26 NOTE — Telephone Encounter (Signed)
We can try memantine instead. -VRP

## 2016-06-26 NOTE — Telephone Encounter (Signed)
Spoke to [place], daughter of pt.  Pt has c/o of GI issues since starting taking the donezepil  po qhs since mid February.  He never got to .  He stayed with her this last week and had diarrhea, dizziness, weakness, Monday, tues and Wed.  He stopped taking and is GI problems have resolved.  She does not feel like it was a GI viral illness.  He never started the memantine.  Would you want him to start this?

## 2016-06-26 NOTE — Telephone Encounter (Signed)
Pt daughter called to inform that donepezil (ARICEPT) 5 MG tablet has made her dad sick and he has stopped taking it for now a week and a day now.  She is asking to be called to see if something else would be given

## 2016-06-27 NOTE — Telephone Encounter (Signed)
Spoke with daughter, Morrie Sheldon and let her know that her dad can start the memantine  po bid.  Similar side effects to look out for (gi issues, dizziness).  Prescription from 05-03-16 at pharmacy. She verbalized understanding.

## 2016-09-12 ENCOUNTER — Other Ambulatory Visit: Payer: Self-pay | Admitting: Family Medicine

## 2016-09-12 DIAGNOSIS — Z136 Encounter for screening for cardiovascular disorders: Secondary | ICD-10-CM

## 2016-09-13 ENCOUNTER — Other Ambulatory Visit: Payer: Self-pay | Admitting: Family Medicine

## 2016-09-13 DIAGNOSIS — Z87891 Personal history of nicotine dependence: Secondary | ICD-10-CM

## 2016-09-13 DIAGNOSIS — Z136 Encounter for screening for cardiovascular disorders: Secondary | ICD-10-CM

## 2016-09-26 ENCOUNTER — Ambulatory Visit
Admission: RE | Admit: 2016-09-26 | Discharge: 2016-09-26 | Disposition: A | Discharge status: Home / Self Care | Payer: Medicare HMO | Source: Ambulatory Visit | Attending: Family Medicine | Admitting: Family Medicine

## 2016-09-26 DIAGNOSIS — Z136 Encounter for screening for cardiovascular disorders: Secondary | ICD-10-CM

## 2016-09-26 DIAGNOSIS — Z87891 Personal history of nicotine dependence: Secondary | ICD-10-CM

## 2016-10-31 ENCOUNTER — Ambulatory Visit: Payer: Medicare HMO | Admitting: Diagnostic Neuroimaging

## 2016-11-01 ENCOUNTER — Encounter: Payer: Self-pay | Admitting: Diagnostic Neuroimaging

## 2017-03-14 ENCOUNTER — Other Ambulatory Visit: Payer: Self-pay | Admitting: Cardiology

## 2017-03-14 DIAGNOSIS — R079 Chest pain, unspecified: Secondary | ICD-10-CM

## 2017-03-21 ENCOUNTER — Ambulatory Visit (HOSPITAL_COMMUNITY)
Admission: RE | Admit: 2017-03-21 | Discharge: 2017-03-21 | Disposition: A | Discharge status: Home / Self Care | Payer: Medicare HMO | Source: Ambulatory Visit | Attending: Cardiology | Admitting: Cardiology

## 2017-03-21 DIAGNOSIS — F17200 Nicotine dependence, unspecified, uncomplicated: Secondary | ICD-10-CM | POA: Diagnosis not present

## 2017-03-21 DIAGNOSIS — R079 Chest pain, unspecified: Secondary | ICD-10-CM | POA: Diagnosis not present

## 2017-03-21 DIAGNOSIS — I2510 Atherosclerotic heart disease of native coronary artery without angina pectoris: Secondary | ICD-10-CM | POA: Diagnosis not present

## 2017-03-21 DIAGNOSIS — R9439 Abnormal result of other cardiovascular function study: Secondary | ICD-10-CM | POA: Insufficient documentation

## 2017-03-21 DIAGNOSIS — Z8673 Personal history of transient ischemic attack (TIA), and cerebral infarction without residual deficits: Secondary | ICD-10-CM | POA: Diagnosis not present

## 2017-03-21 DIAGNOSIS — E785 Hyperlipidemia, unspecified: Secondary | ICD-10-CM | POA: Diagnosis not present

## 2017-03-21 MED ORDER — TECHNETIUM TC 99M TETROFOSMIN IV KIT
10.0000 | PACK | Freq: Once | INTRAVENOUS | Status: AC | PRN
  Administered 2017-03-21: 10 via INTRAVENOUS

## 2017-03-21 MED ORDER — REGADENOSON 0.4 MG/5ML IV SOLN
0.4000 mg | Freq: Once | INTRAVENOUS | Status: AC
  Administered 2017-03-21: 0.4 mg via INTRAVENOUS

## 2017-03-21 MED ORDER — TECHNETIUM TC 99M TETROFOSMIN IV KIT
30.0000 | PACK | Freq: Once | INTRAVENOUS | Status: AC | PRN
  Administered 2017-03-21: 30 via INTRAVENOUS

## 2017-03-21 MED ORDER — REGADENOSON 0.4 MG/5ML IV SOLN
INTRAVENOUS | Status: AC
  Filled 2017-03-21: qty 5

## 2017-03-29 ENCOUNTER — Ambulatory Visit (HOSPITAL_COMMUNITY)
Admission: RE | Admit: 2017-03-29 | Discharge: 2017-03-29 | Disposition: A | Discharge status: Home / Self Care | Payer: Medicare HMO | Source: Ambulatory Visit | Attending: Cardiology | Admitting: Cardiology

## 2017-03-29 ENCOUNTER — Encounter (HOSPITAL_COMMUNITY)
Admission: RE | Disposition: A | Discharge status: Home / Self Care | Payer: Self-pay | Source: Ambulatory Visit | Attending: Cardiology

## 2017-03-29 DIAGNOSIS — I4510 Unspecified right bundle-branch block: Secondary | ICD-10-CM | POA: Insufficient documentation

## 2017-03-29 DIAGNOSIS — F329 Major depressive disorder, single episode, unspecified: Secondary | ICD-10-CM | POA: Diagnosis not present

## 2017-03-29 DIAGNOSIS — Z7982 Long term (current) use of aspirin: Secondary | ICD-10-CM | POA: Insufficient documentation

## 2017-03-29 DIAGNOSIS — R9439 Abnormal result of other cardiovascular function study: Secondary | ICD-10-CM | POA: Diagnosis not present

## 2017-03-29 DIAGNOSIS — Z87891 Personal history of nicotine dependence: Secondary | ICD-10-CM | POA: Diagnosis not present

## 2017-03-29 DIAGNOSIS — Z79899 Other long term (current) drug therapy: Secondary | ICD-10-CM | POA: Diagnosis not present

## 2017-03-29 DIAGNOSIS — I10 Essential (primary) hypertension: Secondary | ICD-10-CM | POA: Diagnosis not present

## 2017-03-29 DIAGNOSIS — K219 Gastro-esophageal reflux disease without esophagitis: Secondary | ICD-10-CM | POA: Insufficient documentation

## 2017-03-29 DIAGNOSIS — I25119 Atherosclerotic heart disease of native coronary artery with unspecified angina pectoris: Secondary | ICD-10-CM | POA: Diagnosis not present

## 2017-03-29 DIAGNOSIS — E785 Hyperlipidemia, unspecified: Secondary | ICD-10-CM | POA: Insufficient documentation

## 2017-03-29 DIAGNOSIS — Z955 Presence of coronary angioplasty implant and graft: Secondary | ICD-10-CM | POA: Insufficient documentation

## 2017-03-29 DIAGNOSIS — I209 Angina pectoris, unspecified: Secondary | ICD-10-CM | POA: Diagnosis present

## 2017-03-29 HISTORY — PX: LEFT HEART CATH AND CORONARY ANGIOGRAPHY: CATH118249

## 2017-03-29 SURGERY — LEFT HEART CATH AND CORONARY ANGIOGRAPHY
Anesthesia: LOCAL

## 2017-03-29 MED ORDER — SODIUM CHLORIDE 0.9 % WEIGHT BASED INFUSION
3.0000 mL/kg/h | INTRAVENOUS | Status: AC
  Administered 2017-03-29: 3 mL/kg/h via INTRAVENOUS

## 2017-03-29 MED ORDER — MIDAZOLAM HCL 2 MG/2ML IJ SOLN
INTRAMUSCULAR | Status: DC | PRN
  Administered 2017-03-29: 1 mg via INTRAVENOUS

## 2017-03-29 MED ORDER — SODIUM CHLORIDE 0.9% FLUSH: 3.0000 mL | INTRAVENOUS | Status: DC | PRN

## 2017-03-29 MED ORDER — HEPARIN (PORCINE) IN NACL 2-0.9 UNIT/ML-% IJ SOLN
INTRAMUSCULAR | Status: AC
  Filled 2017-03-29: qty 500

## 2017-03-29 MED ORDER — FENTANYL CITRATE (PF) 100 MCG/2ML IJ SOLN
INTRAMUSCULAR | Status: DC | PRN
  Administered 2017-03-29: 25 ug via INTRAVENOUS

## 2017-03-29 MED ORDER — IOPAMIDOL (ISOVUE-370) INJECTION 76%
INTRAVENOUS | Status: AC
  Filled 2017-03-29: qty 100

## 2017-03-29 MED ORDER — SODIUM CHLORIDE 0.9 % IV SOLN: 250.0000 mL | INTRAVENOUS | Status: DC | PRN

## 2017-03-29 MED ORDER — SODIUM CHLORIDE 0.9 % WEIGHT BASED INFUSION
1.0000 mL/kg/h | INTRAVENOUS | Status: DC
  Administered 2017-03-29: 250 mL via INTRAVENOUS

## 2017-03-29 MED ORDER — ASPIRIN 81 MG PO CHEW: 81.0000 mg | CHEWABLE_TABLET | ORAL | Status: DC

## 2017-03-29 MED ORDER — LIDOCAINE HCL (PF) 1 % IJ SOLN
INTRAMUSCULAR | Status: AC
  Filled 2017-03-29: qty 30

## 2017-03-29 MED ORDER — HEPARIN (PORCINE) IN NACL 2-0.9 UNIT/ML-% IJ SOLN
INTRAMUSCULAR | Status: AC | PRN
  Administered 2017-03-29: 1500 mL

## 2017-03-29 MED ORDER — SODIUM CHLORIDE 0.9% FLUSH: 3.0000 mL | Freq: Two times a day (BID) | INTRAVENOUS | Status: DC

## 2017-03-29 MED ORDER — SODIUM CHLORIDE 0.9 % IV SOLN: INTRAVENOUS | Status: AC

## 2017-03-29 MED ORDER — LIDOCAINE HCL (PF) 1 % IJ SOLN
INTRAMUSCULAR | Status: DC | PRN
  Administered 2017-03-29: 20 mL

## 2017-03-29 MED ORDER — MIDAZOLAM HCL 2 MG/2ML IJ SOLN
INTRAMUSCULAR | Status: AC
Start: 2017-03-29 — End: 2017-03-29
  Filled 2017-03-29: qty 2

## 2017-03-29 MED ORDER — ACETAMINOPHEN 325 MG PO TABS: 650.0000 mg | ORAL_TABLET | ORAL | Status: DC | PRN

## 2017-03-29 MED ORDER — IOPAMIDOL (ISOVUE-370) INJECTION 76%
INTRAVENOUS | Status: DC | PRN
  Administered 2017-03-29: 60 mL via INTRA_ARTERIAL

## 2017-03-29 MED ORDER — ONDANSETRON HCL 4 MG/2ML IJ SOLN: 4.0000 mg | Freq: Four times a day (QID) | INTRAMUSCULAR | Status: DC | PRN

## 2017-03-29 MED ORDER — FENTANYL CITRATE (PF) 100 MCG/2ML IJ SOLN
INTRAMUSCULAR | Status: AC
  Filled 2017-03-29: qty 2

## 2017-03-29 SURGICAL SUPPLY — 7 items
CATH INFINITI 5FR MULTPACK ANG (CATHETERS) ×2 IMPLANT
KIT HEART LEFT (KITS) ×2 IMPLANT
PACK CARDIAC CATHETERIZATION (CUSTOM PROCEDURE TRAY) ×2 IMPLANT
SHEATH PINNACLE 5F 10CM (SHEATH) ×2 IMPLANT
SYR MEDRAD MARK V 150ML (SYRINGE) ×2 IMPLANT
TRANSDUCER W/STOPCOCK (MISCELLANEOUS) ×2 IMPLANT
WIRE EMERALD 3MM-J .035X150CM (WIRE) ×2 IMPLANT

## 2017-03-29 NOTE — Interval H&P Note (Signed)
Cath Lab Visit (complete for each Cath Lab visit)  Clinical Evaluation Leading to the Procedure:   ACS: No.  Non-ACS:    Anginal Classification: CCS III  Anti-ischemic medical therapy: Maximal Therapy (2 or more classes of medications)  Non-Invasive Test Results: High-risk stress test findings: cardiac mortality >3%/year  Prior CABG: No previous CABG      History and Physical Interval Note:  03/29/2017 7:24 AM  Tyrone Noble  has presented today for surgery, with the diagnosis of abnormal stress test - cp  The various methods of treatment have been discussed with the patient and family. After consideration of risks, benefits and other options for treatment, the patient has consented to  Procedure(s): LEFT HEART CATH AND CORONARY ANGIOGRAPHY (N/A) as a surgical intervention .  The patient's history has been reviewed, patient examined, no change in status, stable for surgery.  I have reviewed the patient's chart and labs.  Questions were answered to the patient's satisfaction.     Rinaldo CloudHarwani, Tomy Khim

## 2017-03-29 NOTE — Discharge Instructions (Signed)

## 2017-03-29 NOTE — H&P (Signed)
Printed H&P in the chart needs to be scanned 

## 2017-03-29 NOTE — Progress Notes (Addendum)
Site area: RFA Site Prior to Removal:  Level 0 Pressure Applied For:20min Manual: yes   Patient Status During Pull:  stable Post Pull Site:  Level 0 Post Pull Instructions Given: yes  Post Pull Pulses Present: palpable Dressing Applied:tegaderm   Bedrest begins @ 0840 till 1240 Comments:   

## 2017-03-30 ENCOUNTER — Encounter (HOSPITAL_COMMUNITY): Payer: Self-pay | Admitting: Cardiology

## 2017-05-26 ENCOUNTER — Encounter (HOSPITAL_COMMUNITY): Payer: Self-pay

## 2017-05-26 ENCOUNTER — Emergency Department (HOSPITAL_COMMUNITY): Payer: Medicare HMO

## 2017-05-26 ENCOUNTER — Emergency Department (HOSPITAL_COMMUNITY)
Admission: EM | Admit: 2017-05-26 | Discharge: 2017-05-26 | Disposition: A | Discharge status: Home / Self Care | Payer: Medicare HMO | Attending: Emergency Medicine | Admitting: Emergency Medicine

## 2017-05-26 ENCOUNTER — Other Ambulatory Visit: Payer: Self-pay

## 2017-05-26 DIAGNOSIS — Z7982 Long term (current) use of aspirin: Secondary | ICD-10-CM | POA: Diagnosis not present

## 2017-05-26 DIAGNOSIS — Z79899 Other long term (current) drug therapy: Secondary | ICD-10-CM | POA: Insufficient documentation

## 2017-05-26 DIAGNOSIS — I10 Essential (primary) hypertension: Secondary | ICD-10-CM | POA: Insufficient documentation

## 2017-05-26 DIAGNOSIS — R55 Syncope and collapse: Secondary | ICD-10-CM | POA: Insufficient documentation

## 2017-05-26 DIAGNOSIS — R404 Transient alteration of awareness: Secondary | ICD-10-CM

## 2017-05-26 LAB — BASIC METABOLIC PANEL
ANION GAP: 9 (ref 5–15)
BUN: 12 mg/dL (ref 6–20)
CALCIUM: 9.2 mg/dL (ref 8.9–10.3)
CO2: 25 mmol/L (ref 22–32)
Chloride: 104 mmol/L (ref 101–111)
Creatinine, Ser: 1.37 mg/dL — ABNORMAL HIGH (ref 0.61–1.24)
GFR, EST AFRICAN AMERICAN: 60 mL/min — AB (ref >60)
GFR, EST NON AFRICAN AMERICAN: 51 mL/min — AB (ref >60)
Glucose, Bld: 120 mg/dL — ABNORMAL HIGH (ref 65–99)
POTASSIUM: 4.1 mmol/L (ref 3.5–5.1)
Sodium: 138 mmol/L (ref 135–145)

## 2017-05-26 LAB — URINALYSIS, ROUTINE W REFLEX MICROSCOPIC
Bilirubin Urine: NEGATIVE
Glucose, UA: NEGATIVE mg/dL
Hgb urine dipstick: NEGATIVE
Ketones, ur: NEGATIVE mg/dL
Leukocytes, UA: NEGATIVE
NITRITE: NEGATIVE
Protein, ur: NEGATIVE mg/dL
SPECIFIC GRAVITY, URINE: 1.021 (ref 1.005–1.030)
pH: 6 (ref 5.0–8.0)

## 2017-05-26 LAB — CBC
HEMATOCRIT: 40.8 % (ref 39.0–52.0)
HEMOGLOBIN: 13.8 g/dL (ref 13.0–17.0)
MCH: 29.2 pg (ref 26.0–34.0)
MCHC: 33.8 g/dL (ref 30.0–36.0)
MCV: 86.3 fL (ref 78.0–100.0)
Platelets: 242 10*3/uL (ref 150–400)
RBC: 4.73 MIL/uL (ref 4.22–5.81)
RDW: 15.4 % (ref 11.5–15.5)
WBC: 8.7 10*3/uL (ref 4.0–10.5)

## 2017-05-26 LAB — I-STAT TROPONIN, ED: Troponin i, poc: 0 ng/mL (ref 0.00–0.08)

## 2017-05-26 LAB — CBG MONITORING, ED: GLUCOSE-CAPILLARY: 104 mg/dL — AB (ref 65–99)

## 2017-05-26 MED ORDER — SODIUM CHLORIDE 0.9 % IV BOLUS (SEPSIS)
1000.0000 mL | Freq: Once | INTRAVENOUS | Status: AC
  Administered 2017-05-26: 1000 mL via INTRAVENOUS

## 2017-05-26 NOTE — Discharge Instructions (Signed)
Continue taking all of your home medications as prescribed.  Drink plenty of water and get plenty of rest.  Follow-up with neurology on an outpatient basis for reevaluation of your symptoms.  Urgency department if any concerning signs or symptoms develop such as loss of consciousness, seizures, weakness especially to one side of the body, facial droop, slurred speech, chest pain, or shortness of breath.

## 2017-05-26 NOTE — ED Notes (Signed)
ED Provider at bedside. 

## 2017-05-26 NOTE — ED Provider Notes (Signed)
Medical screening examination/treatment/procedure(s) were conducted as a shared visit with non-physician practitioner(s) and myself.  I personally evaluated the patient during the encounter.   EKG Interpretation  Date/Time:  Saturday May 26 2017 10:01:58 EST Ventricular Rate:  60 PR Interval:    QRS Duration: 149 QT Interval:  438 QTC Calculation: 438 R Axis:   -75 Text Interpretation:  Sinus rhythm RBBB and LAFB Baseline wander in lead(s) II III aVF no change from previous. Confirmed by Arby BarrettePfeiffer, Jolee Critcher [phone]([id number]) on 05/26/2017 2:17:50 PM     Patient has prior history of hypertension, dementia, CVA and TIA.  9 AM, with family members on the way to get breakfast, patient abruptly started staring off into space and not responding.  This lasted for several minutes.  Patient was slightly confused and not responding verbally but awake with staring.  Patient was returned to baseline by time of evaluation.  Patient has no recall of this event.  He denies any headache, vision change, chest pain, shortness of breath.  He denies any focal weakness numbness or tingling.  Patient is alert and interactive.  Speech is oriented to situation.  No respiratory distress.  Patient has equivocal right mouth droop (family members believe this is a slight change).  Otherwise cranial nerves II through XII intact with midline tongue.  Patient performs all grip strength, push pull, elevation of extremities and resistance at 5\5 motor strength with no localizing sensory deficit to light touch.  Heart is regular.  At this time, does not be code stroke criteria.  I did have PA-C Fawze review with Dr. Amada JupiterKirkpatrick.  Will proceed with MRI to rule out stroke.  Patient has known history of TIA and is actively taking Plavix.  Patient's blood pressures are controlled.  Do not suspect hypertensive encephalopathy.  MRI returned without acute stroke.  This may represent a seizure episode.  Patient has known history of prior CVA.  Patient  is stable with clear mental status.  All symptoms resolved.  Plan will be outpatient follow-up with neurology.   Arby BarrettePfeiffer, Malaka Ruffner, MD 05/27/17 0900

## 2017-05-26 NOTE — ED Triage Notes (Signed)
[facility] r/t syncopal event while sitting in car, family reports they estimate that episode may have lasted 2 minutes. Pt reports he took his daily medications today without eating. Pt was in car to go get breakfast. Pt unsure of what medications he took states that daughter gives all medications/is care giver.

## 2017-05-26 NOTE — ED Notes (Signed)
Patient transported to MRI 

## 2017-05-26 NOTE — ED Notes (Signed)
Patient transported to CT 

## 2017-05-26 NOTE — ED Notes (Signed)
Daughter states that pt did not take medications this morning.

## 2017-05-26 NOTE — ED Provider Notes (Signed)
[facility] EMERGENCY DEPARTMENT Provider Note   CSN: [id number] Arrival date & time: 05/26/17  4098     History   Chief Complaint Chief Complaint  Patient presents with  . Loss of Consciousness    HPI Tyrone Noble is a 67 y.o. male with history of CAD, HTN, early stages of dementia, stroke and TIA presents for evaluation of acute onset, improving episode of unresponsiveness.  Per patient's daughter, at around 9:00 this morning he was in her vehicle on their way to get breakfast.  She states that she stepped aside for a few minutes and upon returning to the vehicle the patient was staring off into space and not responsive to any stimuli.  She called EMS and on their arrival the patient had improved somewhat but was confused and required assistance to ambulate to the ambulance.  Patient's son-in-law states that he was unresponsive for at least 3-5 minutes with slow improvement.  They state that he is at his baseline currently.  Per the patient, he remembers wanting to go to the bathroom while in the vehicle and the next thing he remembers he is in the ambulance.  He denies headaches, vision changes, numbness, tingling, or weakness.  He denies chest pain, shortness of breath, abdominal pain, nausea, or vomiting.  No fevers or chills.  He is on Plavix and has not missed any doses but has not had any of his morning medications today.  The history is provided by the patient and a relative.    Past Medical History:  Diagnosis Date  . Coronary artery disease   . Depression   . Headache    "monthly" (02/16/2015)  . Heart disease   . Hypertension   . Memory loss   . Stroke [facility])    "years ago" (02/16/2015)    Patient Active Problem List   Diagnosis Date Noted  . New-onset angina (HCC) 02/16/2015  . HYPERLIPIDEMIA 12/13/2009  . ALCOHOLISM 07/07/2009  . CALLUS, TOE 01/03/2007  . DEPRESSION, MAJOR, RECURRENT 05/17/2006  . TOBACCO DEPENDENCE 05/17/2006  .  HYPERTENSION, BENIGN SYSTEMIC 05/17/2006  . HEMORRHOIDS, NOS 05/17/2006  . GASTROESOPHAGEAL REFLUX, NO ESOPHAGITIS 05/17/2006  . LEG PAIN OR KNEE PAIN 05/17/2006    Past Surgical History:  Procedure Laterality Date  . CARDIAC CATHETERIZATION N/A 02/16/2015   Procedure: Left Heart Cath and Coronary Angiography;  Surgeon: Rinaldo Cloud, MD;  Location: [facility] INVASIVE CV LAB;  Service: Cardiovascular;  Laterality: N/A;  . CARDIAC CATHETERIZATION N/A 02/16/2015   Procedure: Coronary Stent Intervention;  Surgeon: Rinaldo Cloud, MD;  Location: [facility] INVASIVE CV LAB;  Service: Cardiovascular;  Laterality: N/A;  . CARDIAC CATHETERIZATION N/A 02/16/2015   Procedure: Intravascular Pressure Wire/FFR Study;  Surgeon: Rinaldo Cloud, MD;  Location: [facility] INVASIVE CV LAB;  Service: Cardiovascular;  Laterality: N/A;  . CORONARY ANGIOPLASTY    . LEFT HEART CATH AND CORONARY ANGIOGRAPHY N/A 03/29/2017   Procedure: LEFT HEART CATH AND CORONARY ANGIOGRAPHY;  Surgeon: Rinaldo Cloud, MD;  Location: [facility] INVASIVE CV LAB;  Service: Cardiovascular;  Laterality: N/A;  . LEFT HEART CATHETERIZATION WITH CORONARY ANGIOGRAM N/A 03/19/2012   Procedure: LEFT HEART CATHETERIZATION WITH CORONARY ANGIOGRAM;  Surgeon: Robynn Pane, MD;  Location: [facility] CATH LAB;  Service: Cardiovascular;  Laterality: N/A;  . WISDOM TOOTH EXTRACTION         Home Medications    Prior to Admission medications   Medication Sig Start Date End Date Taking? Authorizing Provider  amLODipine (NORVASC) 5 MG tablet Take 1  tablet (5 mg total) by mouth daily. Patient taking differently: Take 10 mg by mouth daily.  02/17/15  Yes Rinaldo Cloud, MD  aspirin 81 MG chewable tablet Chew 1 tablet (81 mg total) by mouth daily. 02/17/15  Yes Rinaldo Cloud, MD  atorvastatin (LIPITOR) 10 MG tablet Take 10 mg by mouth daily. 05/08/17  Yes [provider]  clopidogrel (PLAVIX) 75 MG tablet Take 75 mg by mouth daily. 11/19/15  Yes [provider]    escitalopram (LEXAPRO) 10 MG tablet Take 10 mg by mouth daily.  01/30/16  Yes [provider]  lisinopril (PRINIVIL,ZESTRIL) 20 MG tablet Take 1 tablet (20 mg total) by mouth daily. 02/17/15  Yes Rinaldo Cloud, MD  metoprolol succinate (TOPROL-XL) 25 MG 24 hr tablet Take 25 mg by mouth daily. 11/19/15  Yes [provider]  Multiple Vitamins-Minerals (MULTIVITAMIN WITH MINERALS) tablet Take 1 tablet by mouth daily.   Yes [provider]  nitroGLYCERIN (NITROSTAT) 0.4 MG SL tablet Place 1 tablet (0.4 mg total) under the tongue every 5 (five) minutes as needed for chest pain. 02/17/15  Yes Rinaldo Cloud, MD  risperiDONE (RISPERDAL) 1 MG tablet Take 1 mg by mouth at bedtime. 04/08/16  Yes [provider]  donepezil (ARICEPT) 10 MG tablet Take 1 tablet (10 mg total) by mouth at bedtime. Patient not taking: Reported on 05/26/2017 05/03/16   Penumalli, Glenford Bayley, MD  donepezil (ARICEPT) 5 MG tablet Take 1 tablet (5 mg total) by mouth at bedtime. Patient not taking: Reported on 05/26/2017 05/03/16   Penumalli, Glenford Bayley, MD  memantine (NAMENDA) 10 MG tablet Take 1 tablet (10 mg total) by mouth 2 (two) times daily. Patient not taking: Reported on 05/26/2017 05/03/16   Suanne Marker, MD    Family History Family History  Problem Relation Age of Onset  . Diabetes Mother   . Hypertension Mother   . Stroke Father   . Cancer Sister     Social History Social History   Tobacco Use  . Smoking status: Former Games developer  . Smokeless tobacco: Never Used  . Tobacco comment: quit many years ago  Substance Use Topics  . Alcohol use: Yes    Alcohol/week: 0.0 oz    Comment: 02/16/2015 "last alcohol was in ~ 2012"  . Drug use: No     Allergies   Penicillins   Review of Systems Review of Systems  Constitutional: Negative for chills and fever.  Eyes: Negative for visual disturbance.  Respiratory: Negative for shortness of breath.   Cardiovascular: Negative for chest  pain.  Gastrointestinal: Negative for abdominal pain, nausea and vomiting.  Musculoskeletal: Negative for back pain and neck pain.  Neurological: Positive for syncope and facial asymmetry. Negative for weakness, light-headedness, numbness and headaches.  Psychiatric/Behavioral: Positive for confusion.  All other systems reviewed and are negative.    Physical Exam Updated Vital Signs BP 134/68   Pulse 62   Temp 97.9 F (36.6 C) (Oral)   Resp 10   Ht 5\' 7"  (1.702 m)   Wt 68 kg (150 lb)   SpO2 100%   BMI 23.49 kg/m   Physical Exam  Constitutional: He appears well-developed and well-nourished. No distress.  HENT:  Head: Normocephalic and atraumatic.  No Battle's signs, no raccoon's eyes, no rhinorrhea. No hemotympanum. No tenderness to palpation of the face or skull. No deformity, crepitus, or swelling noted.   Eyes: Conjunctivae and EOM are normal. Pupils are equal, round, and reactive to light. Right eye exhibits  no discharge. Left eye exhibits no discharge. Right eye exhibits no nystagmus. Left eye exhibits no nystagmus.  Neck: Normal range of motion. Neck supple. No JVD present. No tracheal deviation present.  No midline spine TTP, no paraspinal muscle tenderness, no deformity, crepitus, or step-off noted   Cardiovascular: Normal rate, regular rhythm, normal heart sounds and intact distal pulses.  Pulmonary/Chest: Effort normal and breath sounds normal. No respiratory distress.  Abdominal: He exhibits no distension.  Musculoskeletal: Normal range of motion. He exhibits no edema or tenderness.  No midline spine TTP, no paraspinal muscle tenderness, no deformity, crepitus, or step-off noted.  5/5 strength of BUE and BLE major muscle groups.   Neurological: He is alert. He has normal strength. He displays a negative Romberg sign. Coordination and gait normal. GCS eye subscore is 4. GCS verbal subscore is 5. GCS motor subscore is 6.  Mental Status:  Alert, thought content  appropriate, oriented to person and place but not time.  Unable to remember some of the events leading up to his presentation to the hospital.  Speech fluent without evidence of aphasia. Able to follow 2 step commands without difficulty.   Right-sided facial droop noted, no pronator drift  Cranial Nerves:  II:  Peripheral visual fields grossly normal, pupils equal, round, reactive to light III,IV, VI: ptosis not present, extra-ocular motions intact bilaterally  V,VII: facial light touch sensation equal VIII: hearing grossly normal to voice  X: uvula elevates symmetrically  XI: bilateral shoulder shrug symmetric and strong XII: midline tongue extension without fassiculations Motor:  Normal tone. 5/5 strength of BUE and BLE major muscle groups including strong and equal grip strength and dorsiflexion/plantar flexion Sensory: light touch normal in all extremities. Cerebellar: normal finger-to-nose with bilateral upper extremities Gait: normal gait and balance. Able to walk on toes and heels with ease.    Skin: No erythema.  Psychiatric: He has a normal mood and affect. His behavior is normal.  Nursing note and vitals reviewed.    ED Treatments / Results  Labs (all labs ordered are listed, but only abnormal results are displayed) Labs Reviewed  BASIC METABOLIC PANEL - Abnormal; Notable for the following components:      Result Value   Glucose, Bld 120 (*)    Creatinine, Ser 1.37 (*)    GFR calc non Af Amer 51 (*)    GFR calc Af Amer 60 (*)    All other components within normal limits  CBG MONITORING, ED - Abnormal; Notable for the following components:   Glucose-Capillary 104 (*)    All other components within normal limits  CBC  URINALYSIS, ROUTINE W REFLEX MICROSCOPIC  I-STAT TROPONIN, ED    EKG  EKG Interpretation  Date/Time:  Saturday May 26 2017 10:01:58 EST Ventricular Rate:  60 PR Interval:    QRS Duration: 149 QT Interval:  438 QTC Calculation: 438 R  Axis:   -75 Text Interpretation:  Sinus rhythm RBBB and LAFB Baseline wander in lead(s) II III aVF no change from previous. Confirmed by Arby BarrettePfeiffer, Marcy [phone]([id number]) on 05/26/2017 2:17:50 PM       Radiology Ct Head Wo Contrast  Result Date: 05/26/2017 CLINICAL DATA:  Syncopal episode this morning. EXAM: CT HEAD WITHOUT CONTRAST TECHNIQUE: Contiguous axial images were obtained from the base of the skull through the vertex without intravenous contrast. COMPARISON:  Head CT dated 03/26/2011.  Brain MRI dated 03/05/2016. FINDINGS: Brain: Generalized parenchymal atrophy with commensurate dilatation of the ventricles and sulci. Chronic small vessel ischemic  changes again noted within the bilateral periventricular and subcortical white matter regions. Small old lacunar infarcts noted within the basal ganglia regions. There is no mass, hemorrhage, edema or other evidence of acute parenchymal abnormality. No extra-axial hemorrhage. Vascular: There are chronic calcified atherosclerotic changes of the large vessels at the skull base. No unexpected hyperdense vessel. Skull: Normal. Negative for fracture or focal lesion. Sinuses/Orbits: No acute finding. Other: None. IMPRESSION: 1. No acute intracranial abnormality. No intracranial mass, hemorrhage or edema. 2. Chronic small vessel ischemic changes within the white matter and basal ganglia. Electronically Signed   By: Bary Richard M.D.   On: 05/26/2017 11:24   Mr Brain Wo Contrast (neuro Protocol)  Result Date: 05/26/2017 CLINICAL DATA:  Altered level of consciousness.  Nausea. EXAM: MRI HEAD WITHOUT CONTRAST TECHNIQUE: Multiplanar, multiecho pulse sequences of the brain and surrounding structures were obtained without intravenous contrast. COMPARISON:  Head CT 05/26/2017 and MRI 03/05/2016 FINDINGS: Brain: There is no evidence of acute infarct, mass, midline shift, or extra-axial fluid collection. Chronic cerebellar greater than cerebral microhemorrhages on the prior MRI  are not nearly as well visualized on the current examination due to differences in technique. Patchy T2 hyperintensities in the subcortical and deep cerebral white matter are similar to the prior MRI and nonspecific but compatible with moderate chronic small vessel ischemic disease. Chronic lacunar infarcts are again seen in the right centrum semiovale, right thalamus, and pons. There is mild T2 heterogeneity throughout the basal ganglia and thalami. There is mild cerebral atrophy. Vascular: Major intracranial vascular flow voids are preserved. Skull and upper cervical spine: Unremarkable bone marrow signal. Sinuses/Orbits: Unremarkable orbits. Paranasal sinuses and mastoid air cells are clear. Other: None. IMPRESSION: 1. No acute intracranial abnormality. 2. Moderate chronic small vessel ischemic disease. Electronically Signed   By: Sebastian Ache M.D.   On: 05/26/2017 13:17    Procedures Procedures (including critical care time)  Medications Ordered in ED Medications  sodium chloride 0.9 % bolus 1,000 mL (0 mLs Intravenous Stopped 05/26/17 1325)     Initial Impression / Assessment and Plan / ED Course  I have reviewed the triage vital signs and the nursing notes.  Pertinent labs & imaging results that were available during my care of the patient were reviewed by me and considered in my medical decision making (see chart for details).     Patient presents for evaluation after episode of altered mental status and unresponsiveness for a few minutes followed by confusion which has improved and resolved on my evaluation.  He is afebrile, vital signs are stable.  He is nontoxic in appearance.  He has a right-sided facial droop on examination but otherwise no focal neurologic deficits and is ambulatory without difficulty. 10:39 AM Spoke with Dr. Luisa Hart with neurology who does not recommend calling code stroke at this time but recommends proceeding with CT of the head.  CT scan of the head shows no  acute intracranial abnormalities but does show chronic small vessel ischemic changes within the white matter and basal ganglia.MRI of the brain shows similar findings with no acute findings.  Lab work is reassuring with no leukocytosis, troponin within normal limits, No UTI, and no hypoglycemia.  I doubt ACS or MI in the absence of chest pain.  Doubt PE with no complaints of shortness of breath, hypoxia, tachycardia, or increased work of breathing.  On reevaluation, patient is resting comfortably tolerating p.o. food and fluids and ambulatory without difficulty.  He is at his mental status baseline.  History and physical examination somewhat suggestive of atypical presentation of seizure.  He appeared to have a postictal state.  No further emergent workup required at this time.  He is stable for discharge home with outpatient neurology follow-up for reevaluation of symptoms.  Discussed indications for return to the ED.  Patient, patient's daughter, and patient's ex-wife all verbalized understanding of and agreement with plan and patient stable for discharge home at this time.  Patient seen and evaluated by Dr. Clarice Pole who agrees with assessment and plan at this time.  Final Clinical Impressions(s) / ED Diagnoses   Final diagnoses:  Altered level of consciousness    ED Discharge Orders    None       Bennye Alm 05/26/17 1715    Arby Barrette, MD 05/28/17 [phone]

## 2017-05-26 NOTE — ED Notes (Signed)
Daughter states that pt was in car slumped over but looking ahead, would not respond when she called his name. States that he did have some clear vomit on him. Pt does not remember daughter speaking to him in the car states the last thing he remembers was trying to get out of the car. C/o nausea at this time.

## 2017-05-31 ENCOUNTER — Ambulatory Visit: Payer: Medicare HMO | Admitting: Neurology

## 2017-05-31 ENCOUNTER — Encounter: Payer: Self-pay | Admitting: Neurology

## 2017-05-31 VITALS — BP 102/70 | HR 67 | Wt 164.0 lb

## 2017-05-31 DIAGNOSIS — R569 Unspecified convulsions: Secondary | ICD-10-CM | POA: Diagnosis not present

## 2017-05-31 DIAGNOSIS — F0390 Unspecified dementia without behavioral disturbance: Secondary | ICD-10-CM | POA: Diagnosis not present

## 2017-05-31 MED ORDER — DIVALPROEX SODIUM ER 500 MG PO TB24: 500.0000 mg | ORAL_TABLET | Freq: Every day | ORAL | 11 refills | Status: DC

## 2017-05-31 NOTE — Progress Notes (Signed)
PATIENT: Tyrone Noble DOB: [birth year]  Chief Complaint  Patient presents with  . New Patient (Initial Visit)    PCP: Dr. Darrow Bussing  . Seizures    Patient was seen in the ED recently and was diagnosed with seizures.      HISTORICAL   SOULEYMANE Noble is a 67 year old male, seen in refer by his primary care doctor [name], [name] for evaluation of seizure, he is accompanied by his son in law Alcario Drought at today's clinical visit, he has lived with his daughter's family for 5 years.  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 Marjory Lies in 2017 for dementia, he did also have a history of alcohol abuse, in remission for many years.  He presented to the emergency room on May 26, 2017 for seizure, he was ready to have breakfast, without warning signs, he began to stare into the space, not responding, whole body shaking, foaming, out of his mouth, postevent confusion last for a few minutes, paramedic was called, he was taken to the emergency room,  MRI of the brain showed no acute abnormality, generalized atrophy, moderate supratentorium small vessel disease.  Laboratory evaluation seen March 2019 showed normal CBC, BMP showed elevated creatinine 1.37, GFR of 51, negative troponin, UA was normal  Is now back to baseline, family denies significant agitations, Mini-Mental Status Examination only 18 out of 30 today  REVIEW OF SYSTEMS: Full 14 system review of systems performed and notable only for as above  ALLERGIES: Allergies  Allergen Reactions  . Penicillins Nausea And Vomiting    Has patient had a PCN reaction causing immediate rash, facial/tongue/throat swelling, SOB or lightheadedness with hypotension:  No  Has patient had a PCN reaction causing severe rash involving mucus membranes or skin necrosis: No  Has patient had a PCN reaction that required hospitalization: No  Has patient had a PCN reaction occurring within the last 10 years: No  If all of the above answers are "NO", then may proceed with Cephalosporin use.    HOME MEDICATIONS: Current Outpatient Medications  Medication Sig Dispense Refill  . amLODipine (NORVASC) 5 MG tablet Take 1 tablet (5 mg total) by mouth daily. (Patient taking differently: Take 10 mg by mouth daily. ) 30 tablet 0  . aspirin 81 MG chewable tablet Chew 1 tablet (81 mg total) by mouth daily. 30 tablet 3  . atorvastatin (LIPITOR) 10 MG tablet Take 10 mg by mouth daily.  5  . clopidogrel (PLAVIX) 75 MG tablet Take 75 mg by mouth daily.    Marland Kitchen donepezil (ARICEPT) 10 MG tablet Take 1 tablet (10 mg total) by mouth at bedtime. 30 tablet 12  . donepezil (ARICEPT) 5 MG tablet Take 1 tablet (5 mg total) by mouth at bedtime. 30 tablet 0  . escitalopram (LEXAPRO) 10 MG tablet Take 10 mg by mouth daily.     Marland Kitchen lisinopril (PRINIVIL,ZESTRIL) 20 MG tablet Take 1 tablet (20 mg total) by mouth daily. 30 tablet 0  . memantine (NAMENDA) 10 MG tablet Take 1 tablet (10 mg total) by mouth 2 (two) times daily. 60 tablet 12  . metoprolol succinate (TOPROL-XL) 25 MG 24 hr tablet Take 25 mg by mouth daily.    . Multiple Vitamins-Minerals (  MULTIVITAMIN WITH MINERALS) tablet Take 1 tablet by mouth daily.    . nitroGLYCERIN (NITROSTAT) 0.4 MG SL tablet Place 1 tablet (0.4 mg total) under the tongue every 5 (five) minutes as needed for chest pain. 25 tablet 12  . risperiDONE (RISPERDAL) 1 MG tablet Take 1 mg by mouth at bedtime.  6   No current facility-administered medications for this visit.     PAST MEDICAL HISTORY: Past Medical History:  Diagnosis Date  . Coronary artery disease   . Depression   . Headache    "monthly" (02/16/2015)  . Heart disease   . Hypertension   . Memory  loss   . Stroke [facility])    "years ago" (02/16/2015)    PAST SURGICAL HISTORY: Past Surgical History:  Procedure Laterality Date  . CARDIAC CATHETERIZATION N/A 02/16/2015   Procedure: Left Heart Cath and Coronary Angiography;  Surgeon: Rinaldo Cloud, MD;  Location: [facility] INVASIVE CV LAB;  Service: Cardiovascular;  Laterality: N/A;  . CARDIAC CATHETERIZATION N/A 02/16/2015   Procedure: Coronary Stent Intervention;  Surgeon: Rinaldo Cloud, MD;  Location: [facility] INVASIVE CV LAB;  Service: Cardiovascular;  Laterality: N/A;  . CARDIAC CATHETERIZATION N/A 02/16/2015   Procedure: Intravascular Pressure Wire/FFR Study;  Surgeon: Rinaldo Cloud, MD;  Location: [facility] INVASIVE CV LAB;  Service: Cardiovascular;  Laterality: N/A;  . CORONARY ANGIOPLASTY    . LEFT HEART CATH AND CORONARY ANGIOGRAPHY N/A 03/29/2017   Procedure: LEFT HEART CATH AND CORONARY ANGIOGRAPHY;  Surgeon: Rinaldo Cloud, MD;  Location: [facility] INVASIVE CV LAB;  Service: Cardiovascular;  Laterality: N/A;  . LEFT HEART CATHETERIZATION WITH CORONARY ANGIOGRAM N/A 03/19/2012   Procedure: LEFT HEART CATHETERIZATION WITH CORONARY ANGIOGRAM;  Surgeon: Robynn Pane, MD;  Location: [facility] CATH LAB;  Service: Cardiovascular;  Laterality: N/A;  . WISDOM TOOTH EXTRACTION      FAMILY HISTORY: Family History  Problem Relation Age of Onset  . Diabetes Mother   . Hypertension Mother   . Stroke Father   . Cancer Sister     SOCIAL HISTORY:  Social History   Socioeconomic History  . Marital status: Divorced    Spouse name: Not on file  . Number of children: 2  . Years of education: 57  . Highest education level: Not on file  Social Needs  . Financial resource strain: Not on file  . Food insecurity - worry: Not on file  . Food insecurity - inability: Not on file  . Transportation needs - medical: Not on file  . Transportation needs - non-medical: Not on file  Occupational History    Comment: retired Naval architect  Tobacco Use  . Smoking status:  Former Games developer  . Smokeless tobacco: Never Used  . Tobacco comment: quit many years ago  Substance and Sexual Activity  . Alcohol use: Yes    Alcohol/week: 0.0 oz    Comment: 02/16/2015 "last alcohol was in ~ 2012"  . Drug use: No  . Sexual activity: Not Currently  Other Topics Concern  . Not on file  Social History Narrative   Lives with daughter    caffeine -coffee,  1 cup daily     PHYSICAL EXAM   Vitals:   05/31/17 1559  BP: 102/70  Pulse: 67  Weight: 164 lb (74.4 kg)    Not recorded      Body mass index is 25.69 kg/m.  PHYSICAL EXAMNIATION:  Gen: NAD, conversant, well nourised, obese, well groomed  Cardiovascular: Regular rate rhythm, no peripheral edema, warm, nontender. Eyes: Conjunctivae clear without exudates or hemorrhage Neck: Supple, no carotid bruits. Pulmonary: Clear to auscultation bilaterally   NEUROLOGICAL EXAM:  MMSE - Mini Mental State Exam 05/31/2017 05/03/2016 02/16/2016  Orientation to time 1 3 2   Orientation to Place 3 5 5   Registration 3 3 3   Attention/ Calculation 3 0 0  Recall 0 1 0  Language- name 2 objects 2 2 2   Language- repeat 1 0 1  Language- follow 3 step command 3 2 3   Language- follow 3 step command-comments - did not fold paper -  Language- read & follow direction 1 1 1   Write a sentence 1 0 1  Copy design 0 0 0  Total score 18 17 18    CRANIAL NERVES: CN II: Visual fields are full to confrontation. Fundoscopic exam is normal with sharp discs and no vascular changes. Pupils are round equal and briskly reactive to light. CN III, IV, VI: extraocular movement are normal. No ptosis. CN V: Facial sensation is intact to pinprick in all 3 divisions bilaterally. Corneal responses are intact.  CN VII: Face is symmetric with normal eye closure and smile. CN VIII: Hearing is normal to rubbing fingers CN IX, X: Palate elevates symmetrically. Phonation is normal. CN XI: Head turning and shoulder shrug are intact CN  XII: Tongue is midline with normal movements and no atrophy.  MOTOR: There is no pronator drift of out-stretched arms. Muscle bulk and tone are normal. Muscle strength is normal.  REFLEXES: Reflexes are 2+ and symmetric at the biceps, triceps, knees, and ankles. Plantar responses are flexor.  SENSORY: Intact to light touch, pinprick, positional sensation and vibratory sensation are intact in fingers and toes.  COORDINATION: Rapid alternating movements and fine finger movements are intact. There is no dysmetria on finger-to-nose and heel-knee-shin.    GAIT/STANCE: Posture is normal. Gait is steady with normal steps, base, arm swing, and turning. Heel and toe walking are normal.  Mild difficulty with tandem walking.  Romberg is absent.   DIAGNOSTIC DATA (LABS, IMAGING, TESTING) - I reviewed patient records, labs, notes, testing and imaging myself where available.   ASSESSMENT AND PLAN  Jyl Heinzheodore F Sciortino is a 67 y.o. male    Dementia Seizure on May 26, 2017  EEG  Start Depakote ER 500 mg every night  Return to clinic with nurse practitioner in 3-4 months  Levert FeinsteinYijun Zeppelin Beckstrand, M.D. Ph.D.  [facility] [address], [address] [place], [place] [address] Ph: [phone] Fax: [phone]  CC: Darrow BussingKoirala, [name], MD

## 2017-07-02 ENCOUNTER — Encounter (INDEPENDENT_AMBULATORY_CARE_PROVIDER_SITE_OTHER): Payer: Self-pay

## 2017-07-02 ENCOUNTER — Ambulatory Visit: Payer: Medicare HMO | Admitting: Neurology

## 2017-07-02 DIAGNOSIS — F0390 Unspecified dementia without behavioral disturbance: Secondary | ICD-10-CM

## 2017-07-02 DIAGNOSIS — R569 Unspecified convulsions: Secondary | ICD-10-CM | POA: Diagnosis not present

## 2017-07-04 NOTE — Procedures (Signed)
   HISTORY: 67 year old male,with history of seizure.  TECHNIQUE:  16 channel EEG was performed based on standard 10-16 international system. One channel was dedicated to EKG, which has demonstrates normal sinus rhythm of beats per minutes.  Upon awakening, the posterior background activity was well-developed, in alpha range, reactive to eye opening and closure.  There was no evidence of epileptiform discharge. There are frequent bifrontal muscle and eye blincking artifact.   Photic stimulation was performed, which induced a symmetric photic driving.  Hyperventilation was not  Performed.mality elicit.  No sleep was achieved.  CONCLUSION: This is a  normal awake EEG.  There is no electrodiagnostic evidence of epileptiform discharge.  Levert FeinsteinYijun Herberto Ledwell, M.D. Ph.D.  [facility] [address] [place], [place] [address] Phone: [phone] Fax:      [phone]

## 2017-09-11 NOTE — Progress Notes (Signed)
[facility]  PATIENT: Tyrone Noble DOB: October 25, 1950   REASON FOR VISIT: Follow-up for seizure disorder,dementia HISTORY FROM:patient and daughter Tyrone Noble    HISTORY OF PRESENT ILLNESS: Tyrone Noble is a 67 year old male, seen in refer by his primary care doctor Tyrone Noble for evaluation of seizure, he is accompanied by his son in law Tyrone Noble at today's clinical visit, he has lived with his daughter's family for 5 years.  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 Marjory Noble in 2017 for dementia, he did also have a history of alcohol abuse, in remission for many years.  He presented to the emergency room on May 26, 2017 for seizure, he was ready to have breakfast, without warning signs, he began to stare into the space, not responding, whole body shaking, foaming, out of his mouth, postevent confusion last for a few minutes, paramedic was called, he was taken to the emergency room, MRI of the brain showed no acute abnormality, generalized atrophy, moderate supratentorium small vessel disease. Laboratory evaluation seen March 2019 showed normal CBC, BMP showed elevated creatinine 1.37, GFR of 51, negative troponin, UA was normal Is now back to baseline, family denies significant agitations, Mini-Mental Status Examination only 18 out of 30 today UPDATE 6/26/2019CM [name], 67 year old male returns for follow-up with history of seizure disorder and dementia.  Last seizure occurred in March he is currently on Depakote 500 at bedtime.  Since seen in March by Dr. Terrace Noble, his Aricept was stopped due to nausea and vomiting as well as Namenda.  Memory score today 21 out of 30.  Last 18 out  of 30.  EEG done 07/02/2017 was normal.MRI of the brain 05/26/17 showed no acute abnormality, generalized atrophy, moderate supratentorium small vessel disease. He continues to live with his daughter Tyrone Noble.  She reports that he has a good appetite and sleeping well at night no hallucinations no wandering behavior he returns for reevaluation   REVIEW OF SYSTEMS: Full 14 system review of systems performed and notable only for those listed, all others are neg:  Constitutional: neg  Cardiovascular: neg Ear/Nose/Throat: neg  Skin: neg Eyes: neg Respiratory: neg Gastroitestinal: neg  Hematology/Lymphatic: neg  Endocrine: neg Musculoskeletal:neg Allergy/Immunology: neg Neurological: Memory loss,  seizure Psychiatric: neg Sleep : neg   ALLERGIES: Allergies  Allergen Reactions  . Penicillins Nausea And Vomiting    Has patient had a PCN reaction causing immediate rash, facial/tongue/throat swelling, SOB or lightheadedness with hypotension: No  Has patient had a PCN reaction causing severe rash involving mucus membranes or skin necrosis: No  Has patient had a PCN reaction that required hospitalization: No  Has patient had a PCN reaction occurring within the last 10 years: No  If all of the above answers are "NO", then may proceed with Cephalosporin use.    HOME MEDICATIONS: Outpatient Medications Prior to Visit  Medication Sig Dispense Refill  . amLODipine (NORVASC) 5 MG tablet Take 1 tablet (5 mg total) by mouth daily. (Patient taking differently: Take 10 mg by mouth daily. ) 30 tablet 0  . aspirin 81 MG chewable tablet Chew 1 tablet (81 mg total) by mouth daily. 30 tablet 3  . atorvastatin (LIPITOR) 10 MG tablet Take 10 mg by mouth daily.  5  . clopidogrel (PLAVIX) 75 MG tablet Take 75 mg by mouth daily.    . divalproex (DEPAKOTE ER) 500 MG 24 hr tablet Take 1 tablet (500 mg total) by mouth at bedtime. 30 tablet 11  . escitalopram (LEXAPRO) 10 MG tablet Take 10 mg by mouth daily.     Marland Kitchen.  lisinopril (PRINIVIL,ZESTRIL) 20 MG tablet Take 1 tablet (20 mg total) by mouth daily. 30 tablet 0  . metoprolol succinate (TOPROL-XL) 25 MG 24 hr tablet Take 25 mg by mouth daily.    . Multiple Vitamins-Minerals (MULTIVITAMIN WITH MINERALS) tablet Take 1 tablet by mouth daily.    . nitroGLYCERIN (NITROSTAT) 0.4 MG SL tablet Place 1 tablet (0.4 mg total) under the tongue every 5 (five) minutes as needed for chest pain. 25 tablet 12  . risperiDONE (RISPERDAL) 1 MG tablet Take 1 mg by mouth at bedtime.  6  . donepezil (ARICEPT) 10 MG tablet Take 1 tablet (10 mg total) by mouth at bedtime. 30 tablet 12  . memantine (NAMENDA) 10 MG tablet Take 1 tablet (10 mg total) by mouth 2 (two) times daily. 60 tablet 12   No facility-administered medications prior to visit.     PAST MEDICAL HISTORY: Past Medical History:  Diagnosis Date  . Coronary artery disease   . Depression   . Headache    "monthly" (02/16/2015)  . Heart disease   . Hypertension   . Memory loss   . Stroke [facility](H[facility])    "years ago" (02/16/2015)    PAST SURGICAL HISTORY: Past Surgical History:  Procedure Laterality Date  . CARDIAC CATHETERIZATION N/A 02/16/2015   Procedure: Left Heart Cath and Coronary Angiography;  Surgeon: Rinaldo CloudMohan Harwani, MD;  [facility];  Service: Cardiovascular;  Laterality: N/A;  . CARDIAC CATHETERIZATION N/A 02/16/2015   Procedure: Coronary Stent Intervention;  Surgeon: Rinaldo CloudMohan Harwani, MD;  Location: [facility] INVASIVE CV LAB;  Service: Cardiovascular;  Laterality: N/A;  . CARDIAC CATHETERIZATION N/A 02/16/2015   Procedure: Intravascular Pressure Wire/FFR Study;  Surgeon: Rinaldo CloudMohan Harwani, MD;  [facility];  Service: Cardiovascular;  Laterality: N/A;  . CORONARY ANGIOPLASTY    . LEFT HEART CATH AND CORONARY ANGIOGRAPHY N/A 03/29/2017   Procedure: LEFT HEART CATH AND CORONARY ANGIOGRAPHY;  Surgeon: Rinaldo CloudHarwani, Mohan, MD;  Location: [facility] INVASIVE CV LAB;  Service: Cardiovascular;  Laterality:  N/A;  . LEFT HEART CATHETERIZATION WITH CORONARY ANGIOGRAM N/A 03/19/2012   Procedure: LEFT HEART CATHETERIZATION WITH CORONARY ANGIOGRAM;  Surgeon: Robynn PaneMohan N Harwani, MD;  Location: [facility] CATH LAB;  Service: Cardiovascular;  Laterality: N/A;  . WISDOM TOOTH EXTRACTION      FAMILY HISTORY: Family History  Problem Relation Age of Onset  . Diabetes Mother   . Hypertension Mother   . Stroke Father   . Cancer Sister     SOCIAL HISTORY: Social History   Socioeconomic History  . Marital status: Divorced    Spouse name: Not on file  . Number of children: 2  . Years of education: 8412  . Highest education level: Not on file  Occupational History    Comment: retired Naval architecttruck driver  Social Needs  . Financial resource strain: Not on file  . Food insecurity:    Worry: Not on file    Inability: Not on file  . Transportation needs:    Medical: Not on file    Non-medical: Not on file  Tobacco Use  . Smoking status: Former Games developermoker  . Smokeless tobacco: Never Used  .  Tobacco comment: quit many years ago  Substance and Sexual Activity  . Alcohol use: Yes    Comment: 02/16/2015 "last alcohol was in ~ 2012"  . Drug use: No  . Sexual activity: Not Currently  Lifestyle  . Physical activity:    Days per week: Not on file    Minutes per session: Not on file  . Stress: Not on file  Relationships  . Social connections:    Talks on phone: Not on file    Gets together: Not on file    Attends religious service: Not on file    Active member of club or organization: Not on file    Attends meetings of clubs or organizations: Not on file    Relationship status: Not on file  . Intimate partner violence:    Fear of current or ex partner: Not on file    Emotionally abused: Not on file    Physically abused: Not on file    Forced sexual activity: Not on file  Other Topics Concern  . Not on file  Social History Narrative   Lives with daughter    caffeine -coffee,  1 cup daily     PHYSICAL  EXAM  Vitals:   09/12/17 1319  BP: 128/80  Pulse: 64  Weight: 163 lb (73.9 kg)  Height: 5\' 7"  (1.702 m)   Body mass index is 25.53 kg/m.  Generalized: Well developed, in no acute distress  Head: normocephalic and atraumatic,. Oropharynx benign  Neck: Supple,Musculoskeletal: No deformity   Neurological examination   Mentation: Alert AFT 5.  MMSE - Mini Mental State Exam 09/12/2017 05/31/2017 05/03/2016  Orientation to time 1 1 3   Orientation to Place 4 3 5   Registration 3 3 3   Attention/ Calculation 5 3 0  Recall 0 0 1  Language- name 2 objects 2 2 2   Language- repeat 1 1 0  Language- follow 3 step command 3 3 2   Language- follow 3 step command-comments - - did not fold paper  Language- read & follow direction 1 1 1   Write a sentence 1 1 0  Copy design 0 0 0  Total score 21 18 17    Follows all commands speech and language fluent.   Cranial nerve II-XII: .Pupils were equal round reactive to light extraocular movements were full, visual field were full on confrontational test. Facial sensation and strength were normal. hearing was intact to finger rubbing bilaterally. Uvula tongue midline. head turning and shoulder shrug were normal and symmetric.Tongue protrusion into cheek strength was normal. Motor: normal bulk and tone, full strength in the BUE, BLE, fine finger movements normal, no pronator drift. No focal weakness Sensory: normal and symmetric to light touch,   Coordination: finger-nose-finger, heel-to-shin bilaterally, no dysmetria Reflexes: Brachioradialis 2/2, biceps 2/2, triceps 2/2, patellar 2/2, Achilles 2/2, plantar responses were flexor bilaterally. Gait and Station: Rising up from seated position without assistance, normal stance,  moderate stride, good arm swing, smooth turning, able to perform tiptoe, and heel walking without difficulty. Tandem gait is steady.  No assistive device  DIAGNOSTIC DATA (LABS, IMAGING, TESTING) - I reviewed patient records, labs,  notes, testing and imaging myself where available.  Lab Results  Component Value Date   WBC 8.7 05/26/2017   HGB 13.8 05/26/2017   HCT 40.8 05/26/2017   MCV 86.3 05/26/2017   PLT 242 05/26/2017      Component Value Date/Time   NA 138 05/26/2017 1015   K 4.1 05/26/2017 1015   CL  104 05/26/2017 1015   CO2 25 05/26/2017 1015   GLUCOSE 120 (H) 05/26/2017 1015   BUN 12 05/26/2017 1015   CREATININE 1.37 (H) 05/26/2017 1015   CALCIUM 9.2 05/26/2017 1015   PROT 6.9 01/15/2015 0941   ALBUMIN 3.9 01/15/2015 0941   AST 25 01/15/2015 0941   ALT 18 01/15/2015 0941   ALKPHOS 79 01/15/2015 0941   BILITOT 0.8 01/15/2015 0941   GFRNONAA 51 (L) 05/26/2017 1015   GFRAA 60 (L) 05/26/2017 1015   Lab Results  Component Value Date   CHOL 186 03/18/2012   HDL 72 03/18/2012   LDLCALC 105 (H) 03/18/2012   TRIG 43 03/18/2012   CHOLHDL 2.6 03/18/2012   Lab Results  Component Value Date   HGBA1C 5.6 02/16/2016   Lab Results  Component Value Date   VITAMINB12 402 02/16/2016   Lab Results  Component Value Date   TSH 2.120 12/28/2015      ASSESSMENT AND PLAN  67 y.o. year old male  has a past medical history of Coronary artery disease, Depression, Headache, Heart disease, Hypertension, Memory loss, and Stroke (HCC).  And seizure disorder here to follow-up.  EEG done 07/02/2017 was normal.MRI of the brain 05/26/17 showed no acute abnormality, generalized atrophy, moderate supratentorium small vessel disease.  PLAN: Continue Depakote 500mg  at bedtime for now Will check VPA level to monitor for therapeutic level Talk with sister about restarting memantine for dementia, patient had nausea on Aricept Call for seizure activity Follow up 6 months Nilda Riggs, [facility], [facility], APRN  [facility] [address] [place], [place] [address] [phone]

## 2017-09-12 ENCOUNTER — Encounter: Payer: Self-pay | Admitting: Nurse Practitioner

## 2017-09-12 ENCOUNTER — Ambulatory Visit: Payer: Medicare HMO | Admitting: Nurse Practitioner

## 2017-09-12 VITALS — BP 128/80 | HR 64 | Ht 67.0 in | Wt 163.0 lb

## 2017-09-12 DIAGNOSIS — F0390 Unspecified dementia without behavioral disturbance: Secondary | ICD-10-CM | POA: Diagnosis not present

## 2017-09-12 DIAGNOSIS — R569 Unspecified convulsions: Secondary | ICD-10-CM | POA: Diagnosis not present

## 2017-09-12 DIAGNOSIS — Z5181 Encounter for therapeutic drug level monitoring: Secondary | ICD-10-CM

## 2017-09-12 NOTE — Patient Instructions (Signed)
Continue Depakote 500mg  at bedtime for now Will check VPA level  Talk with sister about restarting memantine  Call for seizure activity Follow up 6 months

## 2017-09-13 ENCOUNTER — Telehealth: Payer: Self-pay | Admitting: Nurse Practitioner

## 2017-09-13 LAB — VALPROIC ACID LEVEL: Valproic Acid Lvl: 44 ug/mL — ABNORMAL LOW (ref 50–100)

## 2017-09-13 MED ORDER — DIVALPROEX SODIUM ER 500 MG PO TB24: 1000.0000 mg | ORAL_TABLET | Freq: Every day | ORAL | 6 refills | Status: DC

## 2017-09-13 NOTE — Telephone Encounter (Signed)
I called and spoke to Tyrone Noble, ok per DPR and explained that pts  depakote level was 44 (subtherapeutic).  Pt is to increase his depakote er to 1000mg  po qhs.  (take 2- 500mg  tablets).  Pt has also restarted his memantine 10mg  [po BID.  He would relay to wife of pt.  If questions to call back.

## 2017-09-13 NOTE — Telephone Encounter (Signed)
Valproic acid level subtherapeutic at 44.  Please increase to 2 tabs at bedtime.  New prescription called in. Please call pt or wife

## 2017-09-17 NOTE — Progress Notes (Signed)
I have reviewed and agreed above plan. 

## 2017-10-02 MED ORDER — MEMANTINE HCL 5 MG PO TABS
5.0000 mg | ORAL_TABLET | Freq: Two times a day (BID) | ORAL | 5 refills | Status: DC
Start: 2017-10-02 — End: 2017-10-25

## 2017-10-02 NOTE — Telephone Encounter (Addendum)
Called daughter, Babette Relicammy Lamar Laundry(Sonya is her legal name) on DPR who stated her father's memory is worsening. The family wants to restart him on Memantine but at a lower dose than he was on originally (10 mg twice daily).  Confirmed pharmacy and advised will send her request to NP. She understands she will only get a call back if NP has other information for her. She stated she will get a text from [facility] re: new prescription. She verbalized understanding, appreciation.

## 2017-10-02 NOTE — Telephone Encounter (Signed)
Per NP, memantine 5 mg, take one tablet twice daily ordered.

## 2017-10-02 NOTE — Telephone Encounter (Signed)
Please restart 5mg  BID

## 2017-10-02 NOTE — Telephone Encounter (Addendum)
Pt daughter(on DPR-Cutler,Tammy @ [phone]) has called, she has spoken with her sister(on DPR-Pasillas,Ashley) and they would both like to have pt put back on the  memantine (NAMENDA) 10 MG tablet but at a lower dose that what pt was previously on. Please send to  [id number]- [place], [place] - [address] [phone] (Phone) [phone] (Fax)      Please call

## 2017-10-02 NOTE — Addendum Note (Signed)
Addended by: [place] PinkHESSON, MARY C on: 10/02/2017 03:53 PM   Modules accepted: Orders

## 2017-10-04 NOTE — Telephone Encounter (Signed)
Pt daughter(on DPR) -Tyrone Noble(Sonya is her legal name) [phone] has called and stated pt has complained of breaking into a sweat and a headache.  Daughter would like a call back to discuss if this could be to changes in medication.  Please call

## 2017-10-04 NOTE — Telephone Encounter (Signed)
Spoke with daughter, Babette Relicammy and advised her that headache may be a side effect of memantine. The patient has been on memantine in the past at a higher dose. Advised her if he comp;alins of a severe headache or has other severe allergic reactions such as difficulty breathing or swelling of face, neck, she should stop the medication right away and call this office. Otherwise advised he may need time to get used to medication. Advised he eat and drink well also. This RN advised she call for any further questions. Tammy verbalized understanding, appreciation.

## 2017-10-25 ENCOUNTER — Other Ambulatory Visit: Payer: Self-pay | Admitting: Neurology

## 2017-11-01 IMAGING — CR DG CHEST 2V
2 series · 2 of 2 positions shown · non-contrast
Comparison: Chest x-ray of January 15, 2015

CLINICAL DATA: New onset of right-sided chest pain yesterday ;
recent coronary stent placement

EXAM:
CHEST  2 VIEW

[x chest ap]
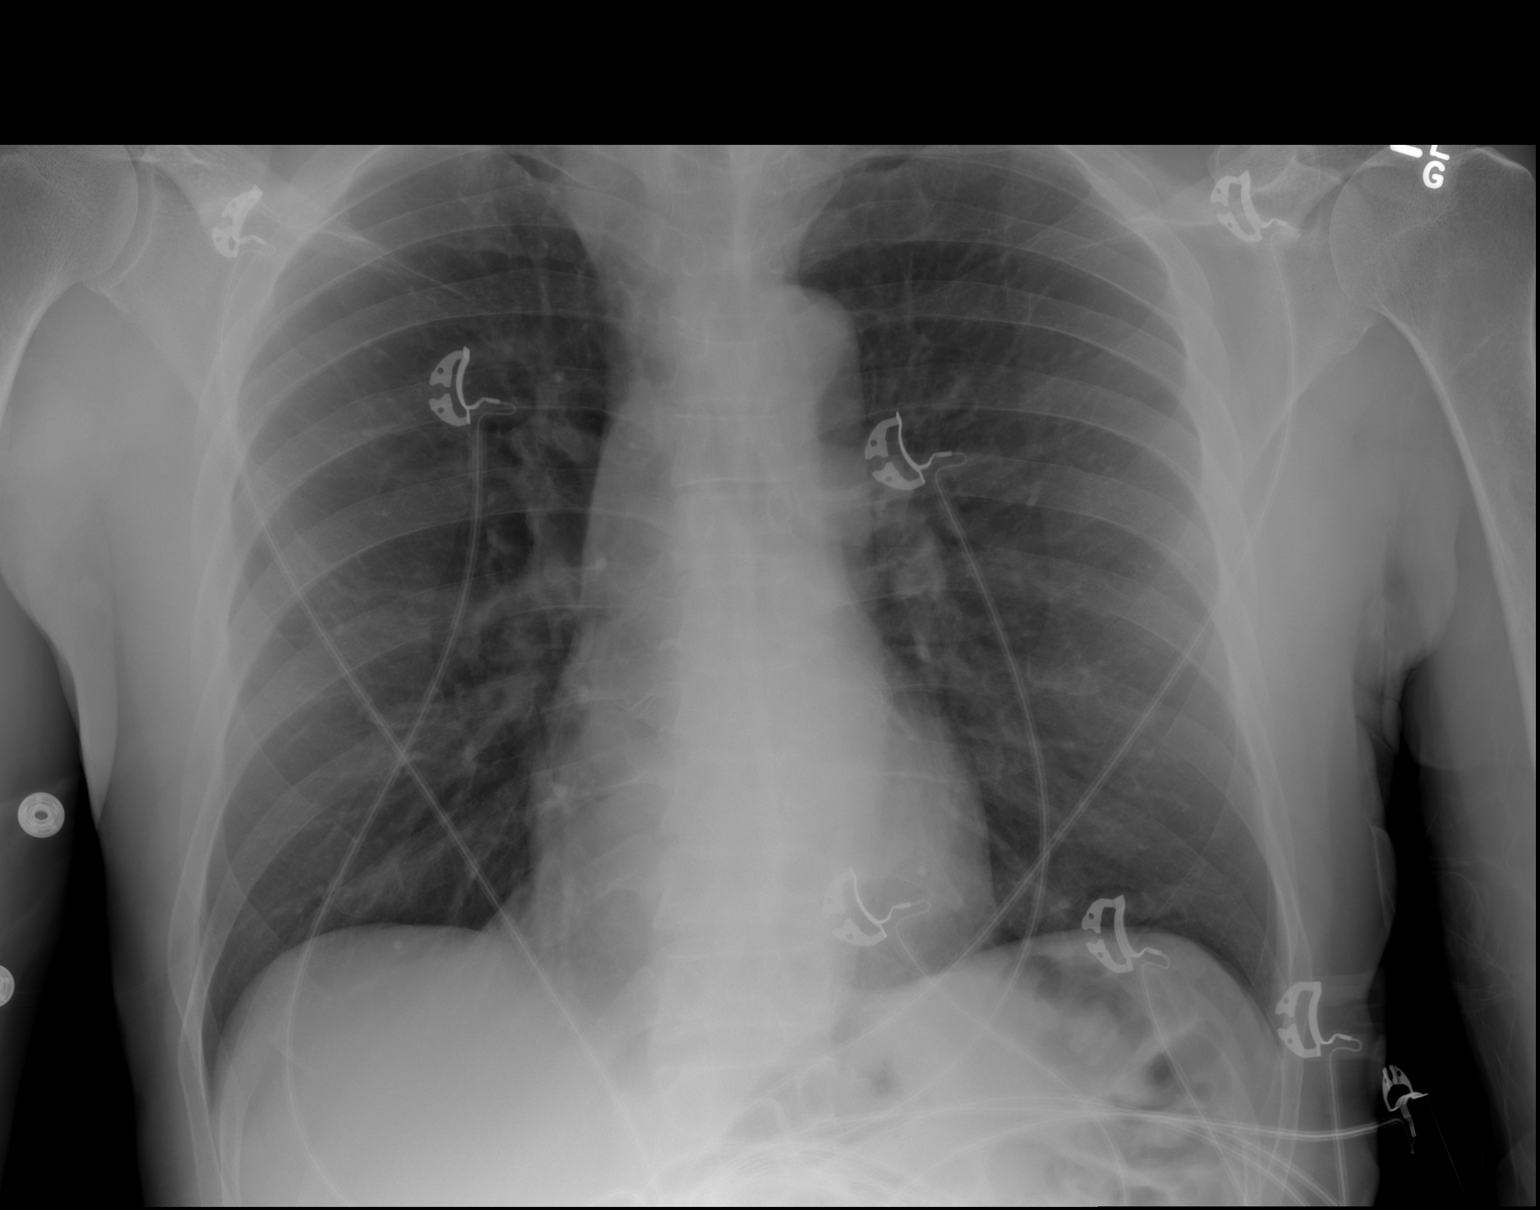

[w chest lat]
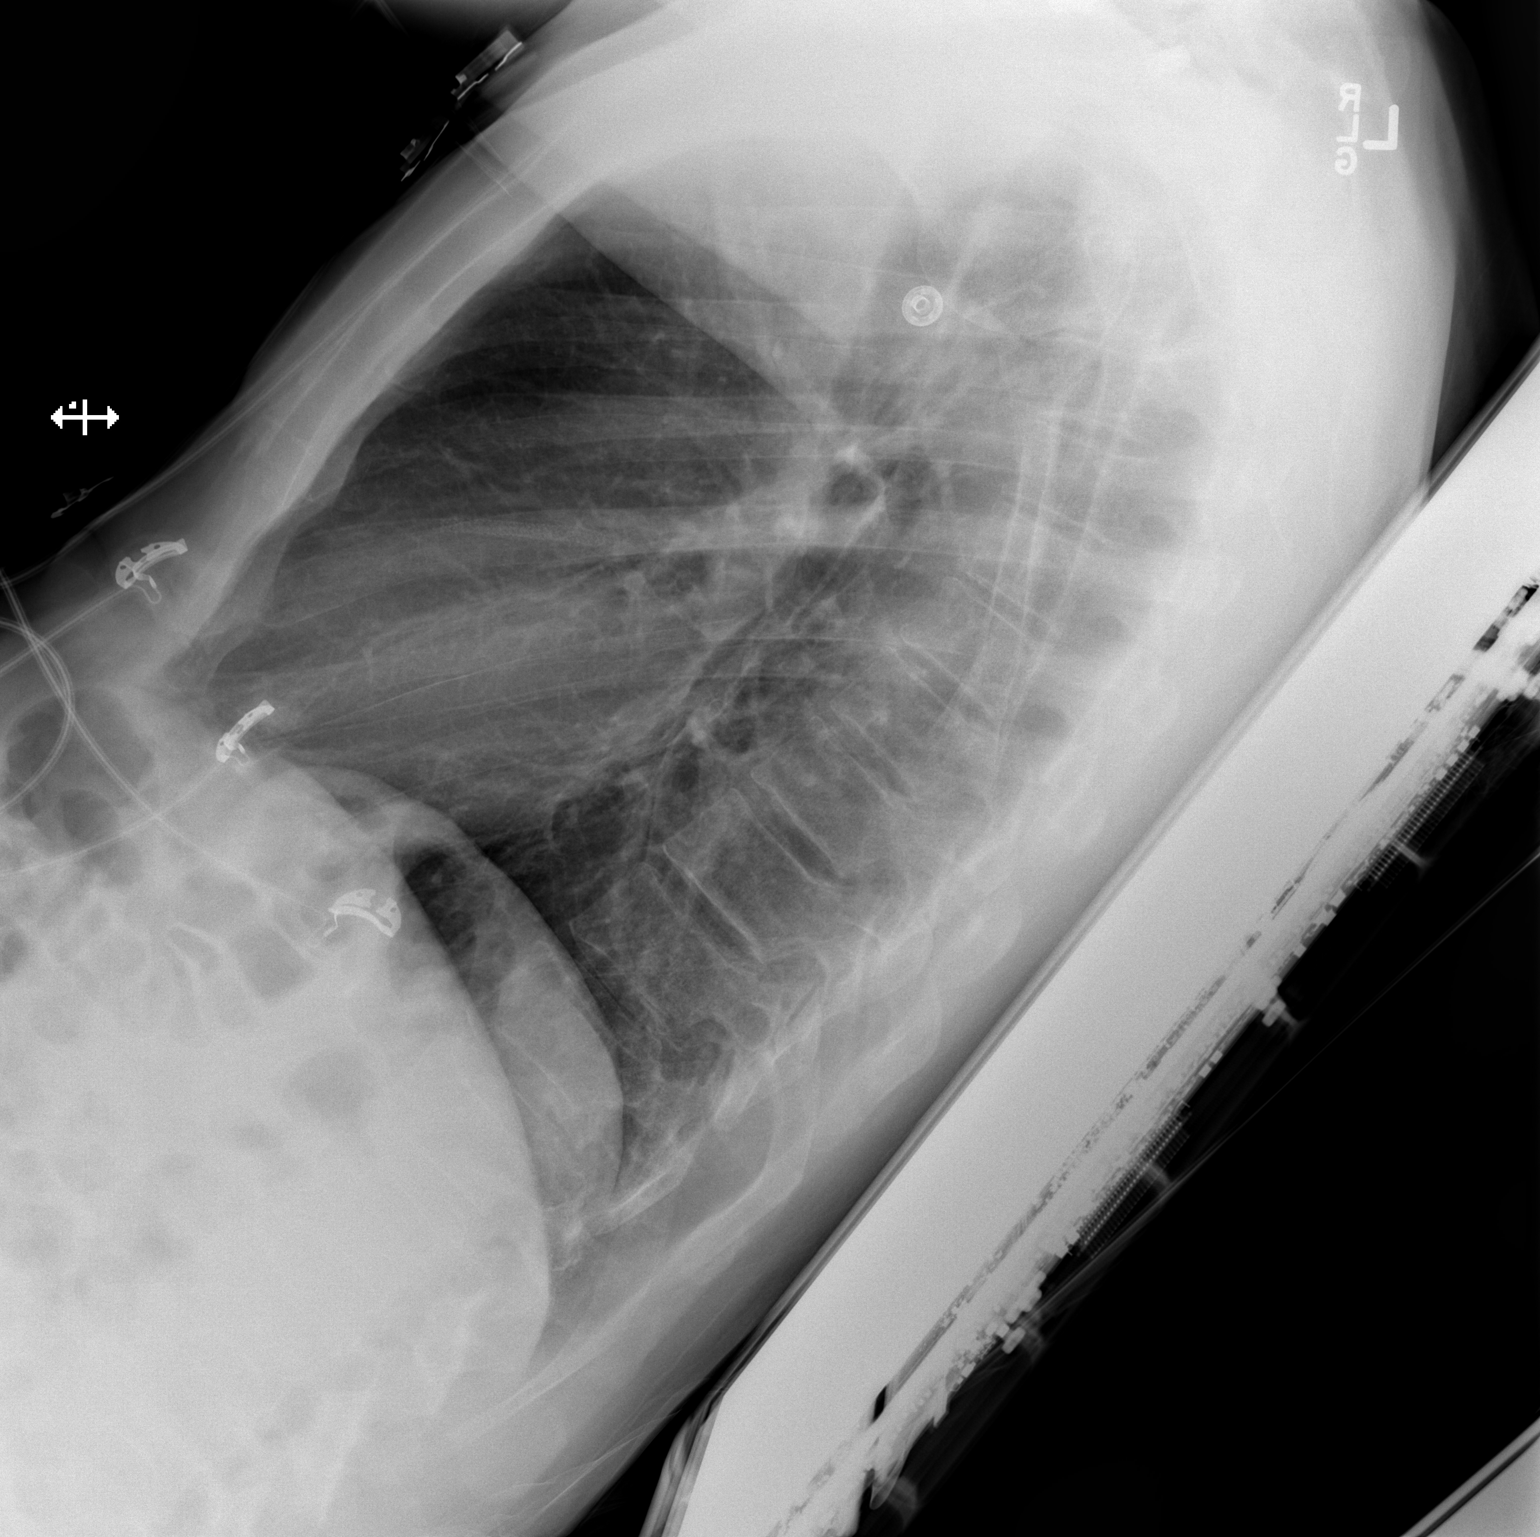

[2 of 2 positions shown; findings below may reference images not displayed]

FINDINGS: The lungs are mildly hyperinflated. There is no focal infiltrate.
There is no pleural effusion. The heart and pulmonary vascularity
are normal. The mediastinum is normal in width. The bony thorax
exhibits no acute abnormality.
IMPRESSION: There is no acute cardiopulmonary abnormality. Mild hyperinflation
may be voluntary or may reflect underlying reactive airway disease
or COPD.

## 2018-02-16 ENCOUNTER — Other Ambulatory Visit: Payer: Self-pay

## 2018-02-16 ENCOUNTER — Emergency Department (HOSPITAL_COMMUNITY)
Admission: EM | Admit: 2018-02-16 | Discharge: 2018-02-16 | Disposition: A | Discharge status: Home / Self Care | Payer: Medicare HMO | Attending: Emergency Medicine | Admitting: Emergency Medicine

## 2018-02-16 ENCOUNTER — Encounter (HOSPITAL_COMMUNITY): Payer: Self-pay | Admitting: Emergency Medicine

## 2018-02-16 DIAGNOSIS — R569 Unspecified convulsions: Secondary | ICD-10-CM | POA: Diagnosis present

## 2018-02-16 DIAGNOSIS — I10 Essential (primary) hypertension: Secondary | ICD-10-CM | POA: Diagnosis not present

## 2018-02-16 DIAGNOSIS — I2510 Atherosclerotic heart disease of native coronary artery without angina pectoris: Secondary | ICD-10-CM | POA: Diagnosis not present

## 2018-02-16 DIAGNOSIS — Z87891 Personal history of nicotine dependence: Secondary | ICD-10-CM | POA: Insufficient documentation

## 2018-02-16 DIAGNOSIS — Z79899 Other long term (current) drug therapy: Secondary | ICD-10-CM | POA: Insufficient documentation

## 2018-02-16 LAB — COMPREHENSIVE METABOLIC PANEL
ALBUMIN: 3.7 g/dL (ref 3.5–5.0)
ALT: 19 U/L (ref 0–44)
ANION GAP: 11 (ref 5–15)
AST: 28 U/L (ref 15–41)
Alkaline Phosphatase: 66 U/L (ref 38–126)
BUN: 12 mg/dL (ref 8–23)
CHLORIDE: 103 mmol/L (ref 98–111)
CO2: 23 mmol/L (ref 22–32)
Calcium: 9 mg/dL (ref 8.9–10.3)
Creatinine, Ser: 1.51 mg/dL — ABNORMAL HIGH (ref 0.61–1.24)
GFR calc Af Amer: 54 mL/min — ABNORMAL LOW (ref >60)
GFR, EST NON AFRICAN AMERICAN: 47 mL/min — AB (ref >60)
GLUCOSE: 128 mg/dL — AB (ref 70–99)
POTASSIUM: 5.1 mmol/L (ref 3.5–5.1)
Sodium: 137 mmol/L (ref 135–145)
TOTAL PROTEIN: 6.8 g/dL (ref 6.5–8.1)
Total Bilirubin: 1.3 mg/dL — ABNORMAL HIGH (ref 0.3–1.2)

## 2018-02-16 LAB — ETHANOL: Alcohol, Ethyl (B): <10 mg/dL (ref <10)

## 2018-02-16 LAB — CBC WITH DIFFERENTIAL/PLATELET
Abs Immature Granulocytes: 0.02 10*3/uL (ref 0.00–0.07)
Basophils Absolute: 0 10*3/uL (ref 0.0–0.1)
Basophils Relative: 1 %
Eosinophils Absolute: 0.2 10*3/uL (ref 0.0–0.5)
Eosinophils Relative: 2 %
HEMATOCRIT: 42.8 % (ref 39.0–52.0)
Hemoglobin: 13.8 g/dL (ref 13.0–17.0)
Immature Granulocytes: 0 %
Lymphocytes Relative: 32 %
Lymphs Abs: 2.8 10*3/uL (ref 0.7–4.0)
MCH: 28.9 pg (ref 26.0–34.0)
MCHC: 32.2 g/dL (ref 30.0–36.0)
MCV: 89.5 fL (ref 80.0–100.0)
MONOS PCT: 8 %
Monocytes Absolute: 0.7 10*3/uL (ref 0.1–1.0)
Neutro Abs: 5.1 10*3/uL (ref 1.7–7.7)
Neutrophils Relative %: 57 %
Platelets: 225 10*3/uL (ref 150–400)
RBC: 4.78 MIL/uL (ref 4.22–5.81)
RDW: 14.5 % (ref 11.5–15.5)
WBC: 8.9 10*3/uL (ref 4.0–10.5)
nRBC: 0 % (ref 0.0–0.2)

## 2018-02-16 LAB — VALPROIC ACID LEVEL: Valproic Acid Lvl: 79 ug/mL (ref 50.0–100.0)

## 2018-02-16 NOTE — ED Provider Notes (Signed)
[facility] EMERGENCY DEPARTMENT Provider Note   CSN: [id number] Arrival date & time: 02/16/18  1129     History   Chief Complaint Chief Complaint  Patient presents with  . Seizures    HPI Tyrone Noble is a 67 y.o. male.  HPI Patient with seizure.  History of seizures.  History of focal seizures with decreased mental status.  Also had right hand shaking.  Was on the bus trip today and had a similar episode.  Confusion after.  Some confusion but improving.  No headache.  Has been taking his medicine. Past Medical History:  Diagnosis Date  . Coronary artery disease   . Depression   . Headache    "monthly" (02/16/2015)  . Heart disease   . Hypertension   . Memory loss   . Stroke [facility])    "years ago" (02/16/2015)    Patient Active Problem List   Diagnosis Date Noted  . Therapeutic drug monitoring 09/12/2017  . Seizures (HCC) 05/31/2017  . Dementia without behavioral disturbance (HCC) 05/31/2017  . New-onset angina (HCC) 02/16/2015  . HYPERLIPIDEMIA 12/13/2009  . ALCOHOLISM 07/07/2009  . CALLUS, TOE 01/03/2007  . DEPRESSION, MAJOR, RECURRENT 05/17/2006  . TOBACCO DEPENDENCE 05/17/2006  . HYPERTENSION, BENIGN SYSTEMIC 05/17/2006  . HEMORRHOIDS, NOS 05/17/2006  . GASTROESOPHAGEAL REFLUX, NO ESOPHAGITIS 05/17/2006  . LEG PAIN OR KNEE PAIN 05/17/2006    Past Surgical History:  Procedure Laterality Date  . CARDIAC CATHETERIZATION N/A 02/16/2015   Procedure: Left Heart Cath and Coronary Angiography;  Surgeon: Rinaldo Cloud, MD;  Location: [facility] INVASIVE CV LAB;  Service: Cardiovascular;  Laterality: N/A;  . CARDIAC CATHETERIZATION N/A 02/16/2015   Procedure: Coronary Stent Intervention;  Surgeon: Rinaldo Cloud, MD;  Location: [facility] INVASIVE CV LAB;  Service: Cardiovascular;  Laterality: N/A;  . CARDIAC CATHETERIZATION N/A 02/16/2015   Procedure: Intravascular Pressure Wire/FFR Study;  Surgeon: Rinaldo Cloud, MD;  Location: [facility] INVASIVE CV LAB;   Service: Cardiovascular;  Laterality: N/A;  . CORONARY ANGIOPLASTY    . LEFT HEART CATH AND CORONARY ANGIOGRAPHY N/A 03/29/2017   Procedure: LEFT HEART CATH AND CORONARY ANGIOGRAPHY;  Surgeon: Rinaldo Cloud, MD;  Location: [facility] INVASIVE CV LAB;  Service: Cardiovascular;  Laterality: N/A;  . LEFT HEART CATHETERIZATION WITH CORONARY ANGIOGRAM N/A 03/19/2012   Procedure: LEFT HEART CATHETERIZATION WITH CORONARY ANGIOGRAM;  Surgeon: Robynn Pane, MD;  Location: [facility] CATH LAB;  Service: Cardiovascular;  Laterality: N/A;  . WISDOM TOOTH EXTRACTION          Home Medications    Prior to Admission medications   Medication Sig Start Date End Date Taking? Authorizing Provider  amLODipine (NORVASC) 5 MG tablet Take 1 tablet (5 mg total) by mouth daily. Patient taking differently: Take 10 mg by mouth daily.  02/17/15   Rinaldo Cloud, MD  aspirin 81 MG chewable tablet Chew 1 tablet (81 mg total) by mouth daily. 02/17/15   Rinaldo Cloud, MD  atorvastatin (LIPITOR) 10 MG tablet Take 10 mg by mouth daily. 05/08/17   [provider]  clopidogrel (PLAVIX) 75 MG tablet Take 75 mg by mouth daily. 11/19/15   [provider]  divalproex (DEPAKOTE ER) 500 MG 24 hr tablet Take 2 tablets (1,000 mg total) by mouth at bedtime. 09/13/17   Nilda Riggs, NP  escitalopram (LEXAPRO) 10 MG tablet Take 10 mg by mouth daily.  01/30/16   [provider]  lisinopril (PRINIVIL,ZESTRIL) 20 MG tablet Take 1 tablet (20 mg total) by mouth daily. 02/17/15  Rinaldo Cloud, MD  memantine (NAMENDA) 5 MG tablet TAKE 1 TABLET BY MOUTH TWICE A DAY 10/26/17   Nilda Riggs, NP  metoprolol succinate (TOPROL-XL) 25 MG 24 hr tablet Take 25 mg by mouth daily. 11/19/15   [provider]  Multiple Vitamins-Minerals (MULTIVITAMIN WITH MINERALS) tablet Take 1 tablet by mouth daily.    [provider]  nitroGLYCERIN (NITROSTAT) 0.4 MG SL tablet Place 1 tablet (0.4 mg total) under the tongue  every 5 (five) minutes as needed for chest pain. 02/17/15   Rinaldo Cloud, MD  risperiDONE (RISPERDAL) 1 MG tablet Take 1 mg by mouth at bedtime. 04/08/16   [provider]    Family History Family History  Problem Relation Age of Onset  . Diabetes Mother   . Hypertension Mother   . Stroke Father   . Cancer Sister     Social History Social History   Tobacco Use  . Smoking status: Former Games developer  . Smokeless tobacco: Never Used  . Tobacco comment: quit many years ago  Substance Use Topics  . Alcohol use: Yes    Comment: 02/16/2015 "last alcohol was in ~ 2012"  . Drug use: No     Allergies   Aricept [donepezil hcl] and Penicillins   Review of Systems Review of Systems  Constitutional: Negative for appetite change.  Respiratory: Negative for shortness of breath.   Cardiovascular: Negative for chest pain.  Gastrointestinal: Negative for abdominal distention.  Genitourinary: Negative for flank pain.  Musculoskeletal: Negative for back pain.  Neurological: Positive for seizures. Negative for weakness.  Psychiatric/Behavioral: Positive for confusion.     Physical Exam Updated Vital Signs BP 126/76 (BP Location: Right Arm)   Pulse 68   Temp 97.6 F (36.4 C) (Oral)   Resp 16   Ht 5\' 6"  (1.676 m)   Wt 70.8 kg   SpO2 99%   BMI 25.18 kg/m   Physical Exam  Constitutional: He appears well-developed.  HENT:  Head: Atraumatic.  Neck: Neck supple.  Cardiovascular: Normal rate.  Pulmonary/Chest: Effort normal.  Abdominal: Soft.  Musculoskeletal: He exhibits no tenderness.  Neurological: He is alert.  Initially confused moving all extremities.  During ER stay mental status improved.  Skin: Skin is warm. Capillary refill takes less than 2 seconds.     ED Treatments / Results  Labs (all labs ordered are listed, but only abnormal results are displayed) Labs Reviewed  COMPREHENSIVE METABOLIC PANEL - Abnormal; Notable for the following components:       Result Value   Glucose, Bld 128 (*)    Creatinine, Ser 1.51 (*)    Total Bilirubin 1.3 (*)    GFR calc non Af Amer 47 (*)    GFR calc Af Amer 54 (*)    All other components within normal limits  CBC WITH DIFFERENTIAL/PLATELET  VALPROIC ACID LEVEL  ETHANOL    EKG EKG Interpretation  Date/Time:  Saturday February 16 2018 11:33:50 EST Ventricular Rate:  63 PR Interval:    QRS Duration: 150 QT Interval:  431 QTC Calculation: 442 R Axis:   -82 Text Interpretation:  Sinus rhythm RBBB and LAFB Confirmed by Benjiman Core [id number]) on 02/16/2018 12:40:53 PM   Radiology No results found.  Procedures Procedures (including critical care time)  Medications Ordered in ED Medications - No data to display   Initial Impression / Assessment and Plan / ED Course  I have reviewed the triage vital signs and the nursing notes.  Pertinent labs & imaging  results that were available during my care of the patient were reviewed by me and considered in my medical decision making (see chart for details).    Patient with seizure.  History of same.  Depakote reassuring.  Mental status improved.  Discharge home  Final Clinical Impressions(s) / ED Diagnoses   Final diagnoses:  Seizure [facility](HCC)    ED Discharge Orders    None       Benjiman CorePickering, Tabia Landowski, MD 02/16/18 1601

## 2018-02-16 NOTE — ED Triage Notes (Signed)
Per [facility]: Patient to ED following possible focal seizure - history of, takes Depakote, last one was 4 years ago. Patient was riding bus and talking on phone with his niece, who states that patient wasn't responding to her for a minute. EMS called - upon their arrival, patient was A&O x 2, and he had R arm tremor. Patient states this felt like a seizure and his R leg briefly felt like it was asleep, but has since resolved. Patient now A&O x 4, no extremity weakness. No incontinence or injury to tongue noted.   EMS VS: CBG 133, 133/76, HR 60; 18g. PIV LAC

## 2018-02-16 NOTE — ED Notes (Signed)
Patient verbalized understanding of discharge instructions and denies any further needs or questions at this time. VS stable. Patient ambulatory with steady gait. Assisted to ED entrance in wheelchair.   

## 2018-02-18 ENCOUNTER — Telehealth: Payer: Self-pay

## 2018-02-18 NOTE — Telephone Encounter (Signed)
Patient's daughter [facility]( [facility]) called stating that her father's last seizure was Saturday. And she would like to know are they suppose to call 911 every time that he has a seizure. Please advise

## 2018-02-19 ENCOUNTER — Other Ambulatory Visit: Payer: Self-pay

## 2018-02-19 ENCOUNTER — Emergency Department (HOSPITAL_COMMUNITY): Payer: Medicare HMO

## 2018-02-19 ENCOUNTER — Encounter (HOSPITAL_COMMUNITY): Payer: Self-pay | Admitting: Emergency Medicine

## 2018-02-19 ENCOUNTER — Emergency Department (HOSPITAL_COMMUNITY)
Admission: EM | Admit: 2018-02-19 | Discharge: 2018-02-19 | Disposition: A | Discharge status: Home / Self Care | Payer: Medicare HMO | Attending: Emergency Medicine | Admitting: Emergency Medicine

## 2018-02-19 DIAGNOSIS — F0390 Unspecified dementia without behavioral disturbance: Secondary | ICD-10-CM | POA: Insufficient documentation

## 2018-02-19 DIAGNOSIS — G92 Toxic encephalopathy: Secondary | ICD-10-CM

## 2018-02-19 DIAGNOSIS — Z7982 Long term (current) use of aspirin: Secondary | ICD-10-CM | POA: Insufficient documentation

## 2018-02-19 DIAGNOSIS — Z87891 Personal history of nicotine dependence: Secondary | ICD-10-CM | POA: Diagnosis not present

## 2018-02-19 DIAGNOSIS — I2510 Atherosclerotic heart disease of native coronary artery without angina pectoris: Secondary | ICD-10-CM | POA: Diagnosis not present

## 2018-02-19 DIAGNOSIS — R4182 Altered mental status, unspecified: Secondary | ICD-10-CM | POA: Diagnosis present

## 2018-02-19 DIAGNOSIS — Z79899 Other long term (current) drug therapy: Secondary | ICD-10-CM | POA: Insufficient documentation

## 2018-02-19 DIAGNOSIS — R410 Disorientation, unspecified: Secondary | ICD-10-CM | POA: Diagnosis not present

## 2018-02-19 DIAGNOSIS — Z7902 Long term (current) use of antithrombotics/antiplatelets: Secondary | ICD-10-CM | POA: Insufficient documentation

## 2018-02-19 DIAGNOSIS — I10 Essential (primary) hypertension: Secondary | ICD-10-CM | POA: Insufficient documentation

## 2018-02-19 LAB — CBC WITH DIFFERENTIAL/PLATELET
Abs Immature Granulocytes: 0.02 10*3/uL (ref 0.00–0.07)
Basophils Absolute: 0 10*3/uL (ref 0.0–0.1)
Basophils Relative: 0 %
Eosinophils Absolute: 0.1 10*3/uL (ref 0.0–0.5)
Eosinophils Relative: 1 %
HEMATOCRIT: 42.6 % (ref 39.0–52.0)
Hemoglobin: 13.2 g/dL (ref 13.0–17.0)
Immature Granulocytes: 0 %
Lymphocytes Relative: 26 %
Lymphs Abs: 2.2 10*3/uL (ref 0.7–4.0)
MCH: 27.9 pg (ref 26.0–34.0)
MCHC: 31 g/dL (ref 30.0–36.0)
MCV: 90.1 fL (ref 80.0–100.0)
Monocytes Absolute: 0.7 10*3/uL (ref 0.1–1.0)
Monocytes Relative: 9 %
NRBC: 0 % (ref 0.0–0.2)
Neutro Abs: 5.4 10*3/uL (ref 1.7–7.7)
Neutrophils Relative %: 64 %
Platelets: 229 10*3/uL (ref 150–400)
RBC: 4.73 MIL/uL (ref 4.22–5.81)
RDW: 14.4 % (ref 11.5–15.5)
WBC: 8.5 10*3/uL (ref 4.0–10.5)

## 2018-02-19 LAB — URINALYSIS, ROUTINE W REFLEX MICROSCOPIC
Bilirubin Urine: NEGATIVE
Glucose, UA: NEGATIVE mg/dL
Hgb urine dipstick: NEGATIVE
Ketones, ur: 5 mg/dL — AB
Leukocytes, UA: NEGATIVE
Nitrite: NEGATIVE
PROTEIN: NEGATIVE mg/dL
Specific Gravity, Urine: 1.015 (ref 1.005–1.030)
pH: 5 (ref 5.0–8.0)

## 2018-02-19 LAB — COMPREHENSIVE METABOLIC PANEL
ALT: 13 U/L (ref 0–44)
AST: 15 U/L (ref 15–41)
Albumin: 3.7 g/dL (ref 3.5–5.0)
Alkaline Phosphatase: 67 U/L (ref 38–126)
Anion gap: 10 (ref 5–15)
BUN: 14 mg/dL (ref 8–23)
CO2: 23 mmol/L (ref 22–32)
Calcium: 9.2 mg/dL (ref 8.9–10.3)
Chloride: 104 mmol/L (ref 98–111)
Creatinine, Ser: 1.7 mg/dL — ABNORMAL HIGH (ref 0.61–1.24)
GFR calc Af Amer: 47 mL/min — ABNORMAL LOW (ref >60)
GFR calc non Af Amer: 41 mL/min — ABNORMAL LOW (ref >60)
Glucose, Bld: 100 mg/dL — ABNORMAL HIGH (ref 70–99)
Potassium: 4.3 mmol/L (ref 3.5–5.1)
Sodium: 137 mmol/L (ref 135–145)
TOTAL PROTEIN: 7.1 g/dL (ref 6.5–8.1)
Total Bilirubin: 0.4 mg/dL (ref 0.3–1.2)

## 2018-02-19 LAB — VALPROIC ACID LEVEL: Valproic Acid Lvl: 81 ug/mL (ref 50.0–100.0)

## 2018-02-19 LAB — CBG MONITORING, ED: Glucose-Capillary: 99 mg/dL (ref 70–99)

## 2018-02-19 LAB — AMMONIA: Ammonia: <9 umol/L — ABNORMAL LOW (ref 9–35)

## 2018-02-19 MED ORDER — LEVETIRACETAM IN NACL 1000 MG/100ML IV SOLN
1000.0000 mg | Freq: Once | INTRAVENOUS | Status: AC
  Administered 2018-02-19: 1000 mg via INTRAVENOUS
  Filled 2018-02-19: qty 100

## 2018-02-19 MED ORDER — SODIUM CHLORIDE 0.9 % IV BOLUS
1000.0000 mL | Freq: Once | INTRAVENOUS | Status: AC
  Administered 2018-02-19: 1000 mL via INTRAVENOUS

## 2018-02-19 NOTE — ED Notes (Signed)
Pt to CT

## 2018-02-19 NOTE — ED Notes (Signed)
Condom Cath placed on pt. 

## 2018-02-19 NOTE — Telephone Encounter (Signed)
LMVM for pts son in law to return call for appt tentatively scheduled 02-20-18 at 1315.

## 2018-02-19 NOTE — ED Provider Notes (Addendum)
[facility] EMERGENCY DEPARTMENT Provider Note   CSN: [id number] Arrival date & time: 02/19/18  1138     History   Chief Complaint Chief Complaint  Patient presents with  . Altered Mental Status    HPI Tyrone Noble is a 67 y.o. male.  The history is provided by a caregiver.  Altered Mental Status   This is a recurrent problem. The current episode started 3 to 5 hours ago. The problem has not changed since onset.Associated symptoms include confusion, somnolence, seizures, unresponsiveness and weakness. Pertinent negatives include no delusions, no self-injury and no violence. Risk factors: unknown. His past medical history is significant for seizures and dementia.    Past Medical History:  Diagnosis Date  . Coronary artery disease   . Depression   . Headache    "monthly" (02/16/2015)  . Heart disease   . Hypertension   . Memory loss   . Stroke [facility])    "years ago" (02/16/2015)    Patient Active Problem List   Diagnosis Date Noted  . Therapeutic drug monitoring 09/12/2017  . Seizures (HCC) 05/31/2017  . Dementia without behavioral disturbance (HCC) 05/31/2017  . New-onset angina (HCC) 02/16/2015  . HYPERLIPIDEMIA 12/13/2009  . ALCOHOLISM 07/07/2009  . CALLUS, TOE 01/03/2007  . DEPRESSION, MAJOR, RECURRENT 05/17/2006  . TOBACCO DEPENDENCE 05/17/2006  . HYPERTENSION, BENIGN SYSTEMIC 05/17/2006  . HEMORRHOIDS, NOS 05/17/2006  . GASTROESOPHAGEAL REFLUX, NO ESOPHAGITIS 05/17/2006  . LEG PAIN OR KNEE PAIN 05/17/2006    Past Surgical History:  Procedure Laterality Date  . CARDIAC CATHETERIZATION N/A 02/16/2015   Procedure: Left Heart Cath and Coronary Angiography;  Surgeon: Rinaldo Cloud, MD;  Location: [facility] INVASIVE CV LAB;  Service: Cardiovascular;  Laterality: N/A;  . CARDIAC CATHETERIZATION N/A 02/16/2015   Procedure: Coronary Stent Intervention;  Surgeon: Rinaldo Cloud, MD;  Location: [facility] INVASIVE CV LAB;  Service: Cardiovascular;   Laterality: N/A;  . CARDIAC CATHETERIZATION N/A 02/16/2015   Procedure: Intravascular Pressure Wire/FFR Study;  Surgeon: Rinaldo Cloud, MD;  Location: [facility] INVASIVE CV LAB;  Service: Cardiovascular;  Laterality: N/A;  . CORONARY ANGIOPLASTY    . LEFT HEART CATH AND CORONARY ANGIOGRAPHY N/A 03/29/2017   Procedure: LEFT HEART CATH AND CORONARY ANGIOGRAPHY;  Surgeon: Rinaldo Cloud, MD;  Location: [facility] INVASIVE CV LAB;  Service: Cardiovascular;  Laterality: N/A;  . LEFT HEART CATHETERIZATION WITH CORONARY ANGIOGRAM N/A 03/19/2012   Procedure: LEFT HEART CATHETERIZATION WITH CORONARY ANGIOGRAM;  Surgeon: Robynn Pane, MD;  Location: [facility] CATH LAB;  Service: Cardiovascular;  Laterality: N/A;  . WISDOM TOOTH EXTRACTION          Home Medications    Prior to Admission medications   Medication Sig Start Date End Date Taking? Authorizing Provider  amLODipine (NORVASC) 5 MG tablet Take 1 tablet (5 mg total) by mouth daily. Patient taking differently: Take 10 mg by mouth daily.  02/17/15   Rinaldo Cloud, MD  aspirin 81 MG chewable tablet Chew 1 tablet (81 mg total) by mouth daily. 02/17/15   Rinaldo Cloud, MD  atorvastatin (LIPITOR) 10 MG tablet Take 10 mg by mouth daily. 05/08/17   [provider]  clopidogrel (PLAVIX) 75 MG tablet Take 75 mg by mouth daily. 11/19/15   [provider]  divalproex (DEPAKOTE ER) 500 MG 24 hr tablet Take 2 tablets (1,000 mg total) by mouth at bedtime. 09/13/17   Nilda Riggs, NP  escitalopram (LEXAPRO) 10 MG tablet Take 10 mg by mouth daily.  01/30/16   [provider]  lisinopril (PRINIVIL,ZESTRIL) 20 MG tablet Take 1 tablet (20 mg total) by mouth daily. 02/17/15   Rinaldo Cloud, MD  memantine (NAMENDA) 5 MG tablet TAKE 1 TABLET BY MOUTH TWICE A DAY 10/26/17   Nilda Riggs, NP  metoprolol succinate (TOPROL-XL) 25 MG 24 hr tablet Take 25 mg by mouth daily. 11/19/15   [provider]  Multiple Vitamins-Minerals (MULTIVITAMIN  WITH MINERALS) tablet Take 1 tablet by mouth daily.    [provider]  nitroGLYCERIN (NITROSTAT) 0.4 MG SL tablet Place 1 tablet (0.4 mg total) under the tongue every 5 (five) minutes as needed for chest pain. 02/17/15   Rinaldo Cloud, MD  risperiDONE (RISPERDAL) 1 MG tablet Take 1 mg by mouth at bedtime. 04/08/16   [provider]    Family History Family History  Problem Relation Age of Onset  . Diabetes Mother   . Hypertension Mother   . Stroke Father   . Cancer Sister     Social History Social History   Tobacco Use  . Smoking status: Former Games developer  . Smokeless tobacco: Never Used  . Tobacco comment: quit many years ago  Substance Use Topics  . Alcohol use: Yes    Comment: 02/16/2015 "last alcohol was in ~ 2012"  . Drug use: No     Allergies   Aricept [donepezil hcl] and Penicillins   Review of Systems Review of Systems  Constitutional: Negative for chills and fever.  HENT: Negative for ear pain and sore throat.   Eyes: Negative for pain and visual disturbance.  Respiratory: Negative for cough and shortness of breath.   Cardiovascular: Negative for chest pain and palpitations.  Gastrointestinal: Negative for abdominal pain and vomiting.  Genitourinary: Negative for dysuria and hematuria.  Musculoskeletal: Negative for arthralgias and back pain.  Skin: Negative for color change and rash.  Neurological: Positive for seizures and weakness. Negative for dizziness, tremors, syncope, facial asymmetry, speech difficulty, light-headedness, numbness and headaches.  Psychiatric/Behavioral: Positive for confusion and decreased concentration. Negative for self-injury.  All other systems reviewed and are negative.    Physical Exam Updated Vital Signs  ED Triage Vitals  Enc Vitals Group     BP 02/19/18 1215 121/71     Pulse Rate 02/19/18 1215 68     Resp 02/19/18 1215 14     Temp --      Temp src --      SpO2 02/19/18 1215 99 %     Weight 02/19/18  1157 154 lb 5.2 oz (70 kg)     Height 02/19/18 1157 5\' 6"  (1.676 m)     Head Circumference --      Peak Flow --      Pain Score 02/19/18 1157 0     Pain Loc --      Pain Edu? --      Excl. in GC? --     Physical Exam  Constitutional: He appears well-developed and well-nourished.  HENT:  Head: Normocephalic and atraumatic.  Eyes: Pupils are equal, round, and reactive to light. Conjunctivae and EOM are normal.  Neck: Normal range of motion. Neck supple.  Cardiovascular: Normal rate, regular rhythm, normal heart sounds and intact distal pulses.  No murmur heard. Pulmonary/Chest: Effort normal and breath sounds normal. No respiratory distress.  Abdominal: Soft. There is no tenderness.  Musculoskeletal: Normal range of motion. He exhibits no edema.  Neurological: He is alert.  he is alert, follows commands, however he is somnolent, moves all  his extremities, cranial nerves appear intact  Skin: Skin is warm and dry.  Psychiatric: He has a normal mood and affect.  Nursing note and vitals reviewed.    ED Treatments / Results  Labs (all labs ordered are listed, but only abnormal results are displayed) Labs Reviewed  COMPREHENSIVE METABOLIC PANEL - Abnormal; Notable for the following components:      Result Value   Glucose, Bld 100 (*)    Creatinine, Ser 1.70 (*)    GFR calc non Af Amer 41 (*)    GFR calc Af Amer 47 (*)    All other components within normal limits  URINALYSIS, ROUTINE W REFLEX MICROSCOPIC - Abnormal; Notable for the following components:   APPearance HAZY (*)    Ketones, ur 5 (*)    All other components within normal limits  AMMONIA - Abnormal; Notable for the following components:   Ammonia <9 (*)    All other components within normal limits  URINE CULTURE  VALPROIC ACID LEVEL  CBC WITH DIFFERENTIAL/PLATELET  CBG MONITORING, ED    EKG None  Radiology Dg Chest 2 View  Result Date: 02/19/2018 CLINICAL DATA:  67 year old male with altered mental status  and lethargy since this morning. EXAM: CHEST - 2 VIEW COMPARISON:  Chest radiographs 12/28/2015. FINDINGS: Lung volumes and mediastinal contours remain normal. Visualized tracheal air column is within normal limits. The lungs appear clear. No pneumothorax or pleural effusion. No acute osseous abnormality identified. Negative visible bowel gas pattern. IMPRESSION: Negative.  No cardiopulmonary abnormality. Electronically Signed   By: Odessa Fleming M.D.   On: 02/19/2018 15:03   Ct Head Wo Contrast  Result Date: 02/19/2018 CLINICAL DATA:  Altered level of consciousness. EXAM: CT HEAD WITHOUT CONTRAST TECHNIQUE: Contiguous axial images were obtained from the base of the skull through the vertex without intravenous contrast. COMPARISON:  CT scan of May 26, 2017. FINDINGS: Brain: Mild diffuse cortical atrophy is noted. Mild chronic ischemic white matter disease is noted. No mass effect or midline shift is noted. Ventricular size is within normal limits. There is no evidence of mass lesion, hemorrhage or acute infarction. Vascular: No hyperdense vessel or unexpected calcification. Skull: Normal. Negative for fracture or focal lesion. Sinuses/Orbits: No acute finding. Other: None. IMPRESSION: Mild diffuse cortical atrophy. Mild chronic ischemic white matter disease. No acute intracranial abnormality seen. Electronically Signed   By: Lupita Raider, M.D.   On: 02/19/2018 14:03    Procedures .Critical Care Performed by: Virgina Norfolk, DO Authorized by: Virgina Norfolk, DO   Critical care provider statement:    Critical care time (minutes):  35   Critical care time was exclusive of:  Separately billable procedures and treating other patients and teaching time   Critical care was necessary to treat or prevent imminent or life-threatening deterioration of the following conditions:  CNS failure or compromise   Critical care was time spent personally by me on the following activities:  Discussions with consultants,  development of treatment plan with patient or surrogate, examination of patient, evaluation of patient's response to treatment, obtaining history from patient or surrogate, ordering and performing treatments and interventions, ordering and review of laboratory studies, ordering and review of radiographic studies, pulse oximetry, re-evaluation of patient's condition and review of old charts   I assumed direction of critical care for this patient from another provider in my specialty: no     (including critical care time)  Medications Ordered in ED Medications  levETIRAcetam (KEPPRA) IVPB 1000 mg/100 mL  premix (0 mg Intravenous Stopped 02/19/18 1324)  sodium chloride 0.9 % bolus 1,000 mL (0 mLs Intravenous Stopped 02/19/18 1431)     Initial Impression / Assessment and Plan / ED Course  I have reviewed the triage vital signs and the nursing notes.  Pertinent labs & imaging results that were available during my care of the patient were reviewed by me and considered in my medical decision making (see chart for details).     Jyl Heinzheodore F Bibian is a 67 year old male with history of dementia, high cholesterol, seizures who presents to the ED with altered mental status.  Patient with normal vitals.  Family member states that patient has been more somnolent, less communicative than normal this morning.  Had a similar episode several weeks ago that was thought to be may be a seizure.  Patient appears to have staring off spells and is on Depakote for these type of seizure-like activities.  Appears to possibly have that same activity today.  Exam is nonfocal.  No concern for stroke.  He is able to follow commands, move all of his extremities but he is somnolent.  Vitals are normal, no fever.  Neurology was consulted due to concern for possible seizure and they recommend 1 g of Keppra but Dr. Jerrell BelfastAurora after consultation believes patient likely with progression of his dementia.  He had no signs of urine infection or  chest infection.  No significant electrolyte abnormality, leukocytosis.  Mild elevation in creatinine and patient was given IV fluids.   Patient was reevaluated and was back to his baseline.  He IS alert, awake.  Family member states that patient is back to how he normally is.  Head CT showed no acute findings.  Low concern for stroke.  Overall possible dementia related symptoms versus postictal state from seizure.  Neurology recommends close follow-up outpatient with his primary neurologist.  No change in seizure medication at this time.  Depakote level was normal.  Discharged from ED in good condition and told her return to the ED if symptoms worsen.  This chart was dictated using voice recognition software.  Despite best efforts to proofread,  errors can occur which can change the documentation meaning.   Final Clinical Impressions(s) / ED Diagnoses   Final diagnoses:  Confusion    ED Discharge Orders    None       Virgina NorfolkCuratolo, Zoraida Havrilla, DO 02/19/18 1525    Virgina Norfolkuratolo, Kadar Chance, DO 02/19/18 1527    Virgina Norfolkuratolo, Koree Staheli, DO 03/01/18 1722

## 2018-02-19 NOTE — ED Triage Notes (Signed)
Pt arrives via POV from home with altered mental status since this morning. Pt lethargic per Son, patient normally able to talk, feed himself. Today unable to feed himself. Per son has hx of seizures, described as abscence seizures.

## 2018-02-19 NOTE — Telephone Encounter (Signed)
Spoke to Gahannaashley, daughter of pt. Pt had seizure (2nd out of 2), last one 4-6 months ago.  Was on a bus with church members, he was feeling fine, noted to have R sided numbness then had seizure.  Call 911, and taken to ED.  VPA normal. NO medication given or med dosages changed.   He had not missed doses.  I relayed that missed doses, stress, illness, sleep deprivation, other meds can cause seizures.  Grand mal seizures, lasting 4-5 min, or back to back seizures  or not frequent sz need 911/ED.  She appreciated information.  She also had ? About dementia.  I gave her [email or web address] website, but when pt in for appt can give dementia packet.  She said her brother in law, Marvia Pickleske williams would bring pt for appt, and call him [phone].  I lmvm for him to call me back 1315 CM/NP available.  She was appreciative of call.  If any other questions/ concerns I encourage her to call.

## 2018-02-19 NOTE — Consult Note (Addendum)
Neurology Consultation  Reason for Consult: AMS Referring Physician: Dr. Lockie Mola  CC: AMS, question of seizure  History is obtained from: Son-in-law, chart  HPI: Tyrone Noble is a 67 y.o. male past medical history of dementia, stroke many years ago with no residual weakness, depression, hypertension, presenting to the emergency room for evaluation of change in mental status. According to the patient son-in-law, despite patient's ongoing dementia, they have a certain routine that he follows every morning-gets up, family gives him his clothes, he dresses up, takes the bus that picks him up to go to an adult daycare, spends his day daycare and comes back.  He is very conversant.  No behavioral issues at baseline. This morning, the son-in-law woke him up, the patient insisted on wearing church clothes and not regular clothes, which the family did not make much off but then he became less conversant and would not respond to questions.  He would just blankly stares in space.  No seizures reported. In the past, he has had a generalized tonic-clonic seizures, for which he is also on Depakote. He follows with [facility] neurology for his memory loss.  He has had trouble with both Aricept and Namenda-GI side effects and his dementia medications were discontinued.  In June 2019, there was plan to talk with the patient's caregivers to consider restarting Namenda if tolerated. He has been in the ER couple weeks ago as well for concern for seizure.  His Depakote level was within therapeutic limits, labs were unremarkable, and he started to come around while in the ER and was discharged home with advice to follow-up outpatient. This is a second time in a few weeks that he has had this change in mental status where he is awake but becomes less responsive and less conversant.  There has been no witnessed tonic-clonic seizure activity. There have been no recent medication changes.  The family member denied any  preceding illnesses/sicknesses.  There was no complaints of chest pain nausea vomiting shortness of breath.  He has not been complaining recently of any worsening headaches.  No visual symptoms.  No bowel bladder issues at this time.  Outpatient MMSE has varied from 18/30-21/30 in the past 1 year.   ROS: ROS was performed and is negative except as noted in the HPI.   Past Medical History:  Diagnosis Date  . Coronary artery disease   . Depression   . Headache    "monthly" (02/16/2015)  . Heart disease   . Hypertension   . Memory loss   . Stroke [facility])    "years ago" (02/16/2015)   Family History  Problem Relation Age of Onset  . Diabetes Mother   . Hypertension Mother   . Stroke Father   . Cancer Sister    Social History:   reports that he has quit smoking. He has never used smokeless tobacco. He reports that he drinks alcohol. He reports that he does not use drugs.  Medications  Current Facility-Administered Medications:  .  levETIRAcetam (KEPPRA) IVPB 1000 mg/100 mL premix, 1,000 mg, Intravenous, Once, Curatolo, Adam, DO  Current Outpatient Medications:  .  amLODipine (NORVASC) 5 MG tablet, Take 1 tablet (5 mg total) by mouth daily. (Patient taking differently: Take 10 mg by mouth daily. ), Disp: 30 tablet, Rfl: 0 .  aspirin 81 MG chewable tablet, Chew 1 tablet (81 mg total) by mouth daily., Disp: 30 tablet, Rfl: 3 .  atorvastatin (LIPITOR) 10 MG tablet, Take 10 mg by mouth daily.,  Disp: , Rfl: 5 .  clopidogrel (PLAVIX) 75 MG tablet, Take 75 mg by mouth daily., Disp: , Rfl:  .  divalproex (DEPAKOTE ER) 500 MG 24 hr tablet, Take 2 tablets (1,000 mg total) by mouth at bedtime., Disp: 60 tablet, Rfl: 6 .  escitalopram (LEXAPRO) 10 MG tablet, Take 10 mg by mouth daily. , Disp: , Rfl:  .  lisinopril (PRINIVIL,ZESTRIL) 20 MG tablet, Take 1 tablet (20 mg total) by mouth daily., Disp: 30 tablet, Rfl: 0 .  memantine (NAMENDA) 5 MG tablet, TAKE 1 TABLET BY MOUTH TWICE A DAY, Disp: 180  tablet, Rfl: 1 .  metoprolol succinate (TOPROL-XL) 25 MG 24 hr tablet, Take 25 mg by mouth daily., Disp: , Rfl:  .  Multiple Vitamins-Minerals (MULTIVITAMIN WITH MINERALS) tablet, Take 1 tablet by mouth daily., Disp: , Rfl:  .  nitroGLYCERIN (NITROSTAT) 0.4 MG SL tablet, Place 1 tablet (0.4 mg total) under the tongue every 5 (five) minutes as needed for chest pain., Disp: 25 tablet, Rfl: 12 .  risperiDONE (RISPERDAL) 1 MG tablet, Take 1 mg by mouth at bedtime., Disp: , Rfl: 6  Exam: Current vital signs: Ht 5\' 6"  (1.676 m)   Wt 70 kg   BMI 24.91 kg/m  Vital signs in last 24 hours: Weight:  [70 kg] 70 kg (12/03 1157)  GENERAL: Awake, alert in NAD HEENT: - Normocephalic and atraumatic, dry mm, no LN++, no Thyromegally LUNGS - Clear to auscultation bilaterally with no wheezes CV - S1S2 RRR, no m/r/g, equal pulses bilaterally. ABDOMEN - Soft, nontender, nondistended with normoactive BS Ext: warm, well perfused, intact peripheral pulses, no edema  NEURO:  Mental Status: AA&Ox2 Language: speech is dysarthric but hypophonic and bradyphrenic.  Naming, repetition, fluency, and comprehension intact-albeit slow Cranial Nerves: PERRL. EOMI, visual fields full, no facial asymmetry, facial sensation intact, hearing intact, tongue/uvula/soft palate midline, normal sternocleidomastoid and trapezius muscle strength. No evidence of tongue atrophy or fibrillations Motor: 5/5 antigravity in all 4 extremities.  No asterixis.  No drift. Tone: is normal and bulk is normal Sensation- Intact to light touch bilaterally Coordination: FTN intact bilaterally Gait- deferred  NIHSS-1 for LOC questions   Labs I have reviewed labs in epic and the results pertinent to this consultation are:   CBC    Component Value Date/Time   WBC 8.9 02/16/2018 1142   RBC 4.78 02/16/2018 1142   HGB 13.8 02/16/2018 1142   HCT 42.8 02/16/2018 1142   PLT 225 02/16/2018 1142   MCV 89.5 02/16/2018 1142   MCH 28.9 02/16/2018  1142   MCHC 32.2 02/16/2018 1142   RDW 14.5 02/16/2018 1142   LYMPHSABS 2.8 02/16/2018 1142   MONOABS 0.7 02/16/2018 1142   EOSABS 0.2 02/16/2018 1142   BASOSABS 0.0 02/16/2018 1142    CMP     Component Value Date/Time   NA 137 02/16/2018 1142   K 5.1 02/16/2018 1142   CL 103 02/16/2018 1142   CO2 23 02/16/2018 1142   GLUCOSE 128 (H) 02/16/2018 1142   BUN 12 02/16/2018 1142   CREATININE 1.51 (H) 02/16/2018 1142   CALCIUM 9.0 02/16/2018 1142   PROT 6.8 02/16/2018 1142   ALBUMIN 3.7 02/16/2018 1142   AST 28 02/16/2018 1142   ALT 19 02/16/2018 1142   ALKPHOS 66 02/16/2018 1142   BILITOT 1.3 (H) 02/16/2018 1142   GFRNONAA 47 (L) 02/16/2018 1142   GFRAA 54 (L) 02/16/2018 1142  B12 level 402 02/16/2016. Hemoglobin A1c 5.6 02/16/2016 TSH 2.1- 12/28/2015.  Imaging I have reviewed the images obtained:  CT-scan of the brain-05/26/2017-no acute intracranial abnormality.  CT head w/o cont today - no acute changes  MRI examination of the brain-05/26/2017-no acute abnormalities.  Chronic small vessel disease.  Patchy T2 hyperintensities in subcortical and deep cerebral white matter unchanged from prior.  Chronic lacunar infarct seen in the right centrum semiovale, right thalamus and pons.  Assessment:  67 year old man with a known history of dementia, seizures presenting to the emergency room for evaluation of change in mental status but he is awake but becomes unresponsive to questions or answers questions and follows commands slower than usual. At his baseline, he is able to dress himself, requires assistance with other ADLs, attends daycare, is reasonably conversant but today he has been slow to do everything. He also has a history of depression listed on his chart.  His son and I was not able to substantiate much on the depression history. Patient has been off therapy for dementia medications due to tolerance issues. Currently his exam remains nonfocal with the exception of  bradyphrenia There is no documented history of tremors or preceding history of Parkinson's.  No history of hallucinations. I suspect that this is possibly progression of his neurodegenerative disease process with behavioral alterations. Seizures should also be ruled out at this time. My suspicion for stroke is low due to nonfocal exam.  Impression: Dementia with possible behavioral disturbance versus toxic metabolic encephalopathy. Evaluate for seizure disorder  Recommendations: -One time Keppra IV for now. -Check depakote level. Depakote to be continued based on level.  If level is therapeutic, continue with current dose.  If level is subtherapeutic, might need change in dose.  Please call once levels are available. -Check routine labs to include CBC, BMP, urinalysis -Preliminary results from the BMP revealed elevated creatinine which I think could be contributing to his toxic metabolic encephalopathy -CT head -Management of AKI per primary team.  I would recommend IV fluids and hydration for now. -Check TSH again as last TSH was 2y ago. -Obtain most accurate medication list- on the chart has Risperdal, Lexapro also listed.  Try to reduce sedating medications as much as possible. -If the patient is admitted for treatment of his medical issues, a spot EEG can be obtained at the time.  Otherwise a spot EEG can be done as an outpatient. -He needs to follow-up with his outpatient neurologist after discharge for formal neuropsychological testing. Plan relayed to Dr. Lockie Molauratolo in the emergency room. -- Milon DikesAshish Joanie Duprey, MD [facility] Pager: [phone] If 7pm to 7am, please call on call as listed on AMION.

## 2018-02-19 NOTE — Telephone Encounter (Signed)
No that is not necessary unless it is a prolonged seizure greater than 3 to 5 minutes or back to back seizures. Depakote level was good in the ER.

## 2018-02-20 ENCOUNTER — Ambulatory Visit: Payer: Medicare HMO | Admitting: Nurse Practitioner

## 2018-02-20 LAB — URINE CULTURE: Culture: NO GROWTH

## 2018-02-20 NOTE — Telephone Encounter (Signed)
I called brother in law.  He did not receive my messages.  I relayed had spoken to sister in law, and due to sz needed sooner appt then January.  I offered appt for today at 1315.  He will arrive at 1245.  He stated pt in ED for AMS yesterday as well.  Has been taking his medication, depakote.

## 2018-02-20 NOTE — Progress Notes (Deleted)
[facility]  PATIENT: Tyrone Noble DOB: May 14, 1950   REASON FOR VISIT: Follow-up for seizure disorder,dementia HISTORY FROM:patient and daughter Tyrone Noble    HISTORY OF PRESENT ILLNESS: Tyrone Noble is a 67 year old male, seen in refer by his primary care doctor Tyrone Noble for evaluation of seizure, he is accompanied by his son in law Tyrone Noble at today's clinical visit, he has lived with his daughter's family for 5 years.  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 Marjory Noble in 2017 for dementia, he did also have a history of alcohol abuse, in remission for many years.  He presented to the emergency room on May 26, 2017 for seizure, he was ready to have breakfast, without warning signs, he began to stare into the space, not responding, whole body shaking, foaming, out of his mouth, postevent confusion last for a few minutes, paramedic was called, he was taken to the emergency room, MRI of the brain showed no acute abnormality, generalized atrophy, moderate supratentorium small vessel disease. Laboratory evaluation seen March 2019 showed normal CBC, BMP showed elevated creatinine 1.37, GFR of 51, negative troponin, UA was normal Is now back to baseline, family denies significant agitations, Mini-Mental Status Examination only 18 out of 30 today UPDATE 6/26/2019CM [name], 67 year old male returns for follow-up with history of seizure disorder and dementia.  Last seizure occurred in March he is currently on Depakote 500 at bedtime.  Since seen in March by Dr. Terrace Noble, his Aricept was stopped due to nausea and vomiting as well as Namenda.  Memory score today 21 out of 30.  Last 18 out  of 30.  EEG done 07/02/2017 was normal.MRI of the brain 05/26/17 showed no acute abnormality, generalized atrophy, moderate supratentorium small vessel disease. He continues to live with his daughter Tyrone Noble.  She reports that he has a good appetite and sleeping well at night no hallucinations no wandering behavior he returns for reevaluation   REVIEW OF SYSTEMS: Full 14 system review of systems performed and notable only for those listed, all others are neg:  Constitutional: neg  Cardiovascular: neg Ear/Nose/Throat: neg  Skin: neg Eyes: neg Respiratory: neg Gastroitestinal: neg  Hematology/Lymphatic: neg  Endocrine: neg Musculoskeletal:neg Allergy/Immunology: neg Neurological: Memory loss,  seizure Psychiatric: neg Sleep : neg   ALLERGIES: Allergies  Allergen Reactions  . Aricept [Donepezil Hcl] Nausea Only  . Penicillins Nausea And Vomiting    Has patient had a PCN reaction causing immediate rash, facial/tongue/throat swelling, SOB or lightheadedness with hypotension: No  Has patient had a PCN reaction causing severe rash involving mucus membranes or skin necrosis: No  Has patient had a PCN reaction that required hospitalization: No  Has patient had a PCN reaction occurring within the last 10 years: No  If all of the above answers are "NO", then may proceed with Cephalosporin use.    HOME MEDICATIONS: Outpatient Medications Prior to Visit  Medication Sig Dispense Refill  . amLODipine (NORVASC) 5 MG tablet Take 1 tablet (5 mg total) by mouth daily. (Patient taking differently: Take 10 mg by mouth daily. ) 30 tablet 0  . aspirin 81 MG chewable tablet Chew 1 tablet (81 mg total) by mouth daily. 30 tablet 3  . atorvastatin (LIPITOR) 10 MG  tablet Take 10 mg by mouth daily.  5  . clopidogrel (PLAVIX) 75 MG tablet Take 75 mg by mouth daily.    . divalproex (DEPAKOTE ER) 500 MG 24 hr tablet Take 2 tablets (1,000 mg total) by mouth at bedtime. 60 tablet 6  . escitalopram (LEXAPRO) 10 MG  tablet Take 10 mg by mouth daily.     Marland Kitchen lisinopril (PRINIVIL,ZESTRIL) 20 MG tablet Take 1 tablet (20 mg total) by mouth daily. 30 tablet 0  . memantine (NAMENDA) 5 MG tablet TAKE 1 TABLET BY MOUTH TWICE A DAY 180 tablet 1  . metoprolol succinate (TOPROL-XL) 25 MG 24 hr tablet Take 25 mg by mouth daily.    . Multiple Vitamins-Minerals (MULTIVITAMIN WITH MINERALS) tablet Take 1 tablet by mouth daily.    . nitroGLYCERIN (NITROSTAT) 0.4 MG SL tablet Place 1 tablet (0.4 mg total) under the tongue every 5 (five) minutes as needed for chest pain. 25 tablet 12  . risperiDONE (RISPERDAL) 1 MG tablet Take 1 mg by mouth at bedtime.  6   No facility-administered medications prior to visit.     PAST MEDICAL HISTORY: Past Medical History:  Diagnosis Date  . Coronary artery disease   . Depression   . Headache    "monthly" (02/16/2015)  . Heart disease   . Hypertension   . Memory loss   . Stroke [facility])    "years ago" (02/16/2015)    PAST SURGICAL HISTORY: Past Surgical History:  Procedure Laterality Date  . CARDIAC CATHETERIZATION N/A 02/16/2015   Procedure: Left Heart Cath and Coronary Angiography;  Surgeon: Rinaldo Cloud, MD;  Location: [facility] INVASIVE CV LAB;  Service: Cardiovascular;  Laterality: N/A;  . CARDIAC CATHETERIZATION N/A 02/16/2015   Procedure: Coronary Stent Intervention;  Surgeon: Rinaldo Cloud, MD;  Location: [facility] INVASIVE CV LAB;  Service: Cardiovascular;  Laterality: N/A;  . CARDIAC CATHETERIZATION N/A 02/16/2015   Procedure: Intravascular Pressure Wire/FFR Study;  Surgeon: Rinaldo Cloud, MD;  Location: [facility] INVASIVE CV LAB;  Service: Cardiovascular;  Laterality: N/A;  . CORONARY ANGIOPLASTY    . LEFT HEART CATH AND CORONARY ANGIOGRAPHY N/A 03/29/2017   Procedure: LEFT HEART CATH AND CORONARY ANGIOGRAPHY;  Surgeon: Rinaldo Cloud, MD;  Location: [facility] INVASIVE CV LAB;  Service: Cardiovascular;  Laterality: N/A;  . LEFT HEART CATHETERIZATION WITH CORONARY ANGIOGRAM N/A 03/19/2012    Procedure: LEFT HEART CATHETERIZATION WITH CORONARY ANGIOGRAM;  Surgeon: Robynn Pane, MD;  Location: [facility] CATH LAB;  Service: Cardiovascular;  Laterality: N/A;  . WISDOM TOOTH EXTRACTION      FAMILY HISTORY: Family History  Problem Relation Age of Onset  . Diabetes Mother   . Hypertension Mother   . Stroke Father   . Cancer Sister     SOCIAL HISTORY: Social History   Socioeconomic History  . Marital status: Divorced    Spouse name: Not on file  . Number of children: 2  . Years of education: 63  . Highest education level: Not on file  Occupational History    Comment: retired Naval architect  Social Needs  . Financial resource strain: Not on file  . Food insecurity:    Worry: Not on file    Inability: Not on file  . Transportation needs:    Medical: Not on file    Non-medical: Not on file  Tobacco Use  . Smoking status: Former Games developer  . Smokeless tobacco: Never Used  . Tobacco comment: quit many years ago  Substance and Sexual Activity  . Alcohol use: Yes  Comment: 02/16/2015 "last alcohol was in ~ 2012"  . Drug use: No  . Sexual activity: Not Currently  Lifestyle  . Physical activity:    Days per week: Not on file    Minutes per session: Not on file  . Stress: Not on file  Relationships  . Social connections:    Talks on phone: Not on file    Gets together: Not on file    Attends religious service: Not on file    Active member of club or organization: Not on file    Attends meetings of clubs or organizations: Not on file    Relationship status: Not on file  . Intimate partner violence:    Fear of current or ex partner: Not on file    Emotionally abused: Not on file    Physically abused: Not on file    Forced sexual activity: Not on file  Other Topics Concern  . Not on file  Social History Narrative   Lives with daughter    caffeine -coffee,  1 cup daily     PHYSICAL EXAM  There were no vitals filed for this visit. There is no height or weight on  file to calculate BMI.  Generalized: Well developed, in no acute distress  Head: normocephalic and atraumatic,. Oropharynx benign  Neck: Supple,Musculoskeletal: No deformity   Neurological examination   Mentation: Alert AFT 5.  MMSE - Mini Mental State Exam 09/12/2017 05/31/2017 05/03/2016  Orientation to time 1 1 3   Orientation to Place 4 3 5   Registration 3 3 3   Attention/ Calculation 5 3 0  Recall 0 0 1  Language- name 2 objects 2 2 2   Language- repeat 1 1 0  Language- follow 3 step command 3 3 2   Language- follow 3 step command-comments - - did not fold paper  Language- read & follow direction 1 1 1   Write a sentence 1 1 0  Copy design 0 0 0  Total score 21 18 17    Follows all commands speech and language fluent.   Cranial nerve II-XII: .Pupils were equal round reactive to light extraocular movements were full, visual field were full on confrontational test. Facial sensation and strength were normal. hearing was intact to finger rubbing bilaterally. Uvula tongue midline. head turning and shoulder shrug were normal and symmetric.Tongue protrusion into cheek strength was normal. Motor: normal bulk and tone, full strength in the BUE, BLE, fine finger movements normal, no pronator drift. No focal weakness Sensory: normal and symmetric to light touch,   Coordination: finger-nose-finger, heel-to-shin bilaterally, no dysmetria Reflexes: Brachioradialis 2/2, biceps 2/2, triceps 2/2, patellar 2/2, Achilles 2/2, plantar responses were flexor bilaterally. Gait and Station: Rising up from seated position without assistance, normal stance,  moderate stride, good arm swing, smooth turning, able to perform tiptoe, and heel walking without difficulty. Tandem gait is steady.  No assistive device  DIAGNOSTIC DATA (LABS, IMAGING, TESTING) - I reviewed patient records, labs, notes, testing and imaging myself where available.  Lab Results  Component Value Date   WBC 8.5 02/19/2018   HGB 13.2  02/19/2018   HCT 42.6 02/19/2018   MCV 90.1 02/19/2018   PLT 229 02/19/2018      Component Value Date/Time   NA 137 02/19/2018 1216   K 4.3 02/19/2018 1216   CL 104 02/19/2018 1216   CO2 23 02/19/2018 1216   GLUCOSE 100 (H) 02/19/2018 1216   BUN 14 02/19/2018 1216   CREATININE 1.70 (H) 02/19/2018 1216  CALCIUM 9.2 02/19/2018 1216   PROT 7.1 02/19/2018 1216   ALBUMIN 3.7 02/19/2018 1216   AST 15 02/19/2018 1216   ALT 13 02/19/2018 1216   ALKPHOS 67 02/19/2018 1216   BILITOT 0.4 02/19/2018 1216   GFRNONAA 41 (L) 02/19/2018 1216   GFRAA 47 (L) 02/19/2018 1216   Lab Results  Component Value Date   CHOL 186 03/18/2012   HDL 72 03/18/2012   LDLCALC 105 (H) 03/18/2012   TRIG 43 03/18/2012   CHOLHDL 2.6 03/18/2012   Lab Results  Component Value Date   HGBA1C 5.6 02/16/2016   Lab Results  Component Value Date   VITAMINB12 402 02/16/2016   Lab Results  Component Value Date   TSH 2.120 12/28/2015      ASSESSMENT AND PLAN  67 y.o. year old male  has a past medical history of Coronary artery disease, Depression, Headache, Heart disease, Hypertension, Memory loss, and Stroke (HCC).  And seizure disorder here to follow-up.  EEG done 07/02/2017 was normal.MRI of the brain 05/26/17 showed no acute abnormality, generalized atrophy, moderate supratentorium small vessel disease.  PLAN: Continue Depakote 500mg  at bedtime for now Will check VPA level to monitor for therapeutic level Talk with sister about restarting memantine for dementia, patient had nausea on Aricept Call for seizure activity Follow up 6 months Nilda Riggs, [facility], [facility], APRN  [facility] [address] [place], [place] [address] [phone]

## 2018-02-26 ENCOUNTER — Encounter: Payer: Self-pay | Admitting: Neurology

## 2018-02-26 ENCOUNTER — Ambulatory Visit: Payer: Medicare HMO | Admitting: Neurology

## 2018-02-26 VITALS — BP 102/69 | HR 76 | Ht 66.0 in | Wt 161.0 lb

## 2018-02-26 DIAGNOSIS — G20 Parkinson's disease: Secondary | ICD-10-CM

## 2018-02-26 DIAGNOSIS — R569 Unspecified convulsions: Secondary | ICD-10-CM | POA: Diagnosis not present

## 2018-02-26 DIAGNOSIS — G20C Parkinsonism, unspecified: Secondary | ICD-10-CM | POA: Insufficient documentation

## 2018-02-26 MED ORDER — RISPERIDONE 1 MG PO TABS: 1.0000 mg | ORAL_TABLET | Freq: Every day | ORAL | 11 refills | Status: DC

## 2018-02-26 MED ORDER — DIVALPROEX SODIUM ER 500 MG PO TB24: 1000.0000 mg | ORAL_TABLET | Freq: Every day | ORAL | 11 refills | Status: DC

## 2018-02-26 MED ORDER — CARBIDOPA-LEVODOPA 25-100 MG PO TABS: 1.0000 | ORAL_TABLET | Freq: Three times a day (TID) | ORAL | 11 refills | Status: DC

## 2018-02-26 NOTE — Progress Notes (Signed)
[facility]  PATIENT: Tyrone Noble DOB: March 13, 1951  HISTORY OF PRESENT ILLNESS: Tyrone Noble is a 67 year old male, seen in refer by his primary care doctor [name], [name] for evaluation of seizure, he is accompanied by his son in law Tyrone Noble at today's clinical visit, he has lived with his daughter's family for 5 years.  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 Marjory Lies in 2017 for dementia, he did also have a history of alcohol abuse, in remission for many years.  He presented to the emergency room on May 26, 2017 for seizure, he was ready to have breakfast, without warning signs, he began to stare into the space, not responding, whole body shaking, foaming, out of his mouth, postevent confusion last for a few minutes, paramedic was called, he was taken to the emergency room, MRI of the brain showed no acute abnormality, generalized atrophy, moderate supratentorium small vessel disease. Laboratory evaluation seen March 2019 showed normal CBC, BMP showed elevated creatinine 1.37, GFR of 51, negative troponin, UA was normal Is now back to baseline, family denies significant agitations, Mini-Mental Status Examination only 18 out of 30 today  UPDATE 6/26/2019CM Tyrone Noble, 67 year old male returns for follow-up with history of seizure disorder and dementia.  Last seizure occurred in March he is currently on Depakote 500 at bedtime.  Since seen in March by Dr. Terrace Arabia, his Aricept was stopped due to nausea and vomiting as well as Namenda.  Memory score today 21 out of 30.  Last 18 out of 30.  EEG done 07/02/2017 was normal.MRI of the brain 05/26/17 showed no acute abnormality, generalized  atrophy, moderate supratentorium small vessel disease. He continues to live with his daughter Tyrone Noble.  She reports that he has a good appetite and sleeping well at night no hallucinations no wandering behavior he returns for reevaluation  UPDATE Feb 26 2018: He is accompanied by his daughter and son in law at visit. He is overall stable,continue to complains of short term memory loss, slow walking since 2018, sleeps too much, poor appetite,   Last seizure was on Nov 30th 2019, he was awake, not responsive,  Feb 18 2018,  Sudden onset of disorientation confusion   He is taking Depakote DR 500 mg 2 tablets every night   REVIEW OF SYSTEMS: Full 14 system review of systems performed and notable only for those listed, all others are neg:    As above  ALLERGIES: Allergies  Allergen Reactions  . Aricept [Donepezil Hcl] Nausea Only  . Penicillins Nausea And Vomiting    Has patient had a PCN reaction causing immediate rash, facial/tongue/throat swelling, SOB or lightheadedness with hypotension: No  Has patient had a PCN reaction causing severe rash involving mucus membranes or skin necrosis: No  Has patient had a PCN reaction that required hospitalization: No  Has patient had a PCN reaction occurring within the last 10 years: No  If all of the above answers are "NO", then may proceed with Cephalosporin use.    HOME MEDICATIONS: Outpatient Medications Prior to Visit  Medication Sig Dispense Refill  . amLODipine (NORVASC) 5 MG tablet Take 1 tablet (5 mg total) by mouth daily. (Patient taking differently: Take 10 mg by mouth daily. ) 30  tablet 0  . aspirin 81 MG chewable tablet Chew 1 tablet (81 mg total) by mouth daily. 30 tablet 3  . atorvastatin (LIPITOR) 10 MG tablet Take 10 mg by mouth daily.  5  . divalproex (DEPAKOTE ER) 500 MG 24 hr tablet Take 2 tablets (1,000 mg total) by mouth at bedtime. 60 tablet 6  . escitalopram (LEXAPRO) 10 MG tablet Take 10 mg by mouth daily.     Marland Kitchen. lisinopril  (PRINIVIL,ZESTRIL) 20 MG tablet Take 1 tablet (20 mg total) by mouth daily. 30 tablet 0  . memantine (NAMENDA) 5 MG tablet TAKE 1 TABLET BY MOUTH TWICE A DAY 180 tablet 1  . metoprolol succinate (TOPROL-XL) 25 MG 24 hr tablet Take 25 mg by mouth daily.    . Multiple Vitamins-Minerals (MULTIVITAMIN WITH MINERALS) tablet Take 1 tablet by mouth daily.    . risperiDONE (RISPERDAL) 1 MG tablet Take 1 mg by mouth at bedtime.  6  . clopidogrel (PLAVIX) 75 MG tablet Take 75 mg by mouth daily.    . nitroGLYCERIN (NITROSTAT) 0.4 MG SL tablet Place 1 tablet (0.4 mg total) under the tongue every 5 (five) minutes as needed for chest pain. 25 tablet 12   No facility-administered medications prior to visit.     PAST MEDICAL HISTORY: Past Medical History:  Diagnosis Date  . Coronary artery disease   . Depression   . Headache    "monthly" (02/16/2015)  . Heart disease   . Hypertension   . Memory loss   . Stroke [facility](HCC)    "years ago" (02/16/2015)    PAST SURGICAL HISTORY: Past Surgical History:  Procedure Laterality Date  . CARDIAC CATHETERIZATION N/A 02/16/2015   Procedure: Left Heart Cath and Coronary Angiography;  Surgeon: Rinaldo CloudMohan Harwani, MD;  [facility];  Service: Cardiovascular;  Laterality: N/A;  . CARDIAC CATHETERIZATION N/A 02/16/2015   Procedure: Coronary Stent Intervention;  Surgeon: Rinaldo CloudMohan Harwani, MD;  Location: [facility] INV[facility] CV LAB;  Service: Cardiovascular;  Laterality: N/A;  . CARDIAC CATHETERIZATION N/A 02/16/2015   Procedure: Intravascular Pressure Wire/FFR Study;  Surgeon: Rinaldo CloudMohan Harwani, MD;  [facility];  Service: Cardiovascular;  Laterality: N/A;  . CORONARY ANGIOPLASTY    . LEFT HEART CATH AND CORONARY ANGIOGRAPHY N/A 03/29/2017   Procedure: LEFT HEART CATH AND CORONARY ANGIOGRAPHY;  Surgeon: Rinaldo CloudHarwani, Mohan, MD;  Location: [facility] INVASIVE CV LAB;  Service: Cardiovascular;  Laterality: N/A;  . LEFT HEART CATHETERIZATION WITH CORONARY ANGIOGRAM N/A  03/19/2012   Procedure: LEFT HEART CATHETERIZATION WITH CORONARY ANGIOGRAM;  Surgeon: Robynn PaneMohan N Harwani, MD;  Location: [facility] CATH LAB;  Service: Cardiovascular;  Laterality: N/A;  . WISDOM TOOTH EXTRACTION      FAMILY HISTORY: Family History  Problem Relation Age of Onset  . Diabetes Mother   . Hypertension Mother   . Stroke Father   . Cancer Sister     SOCIAL HISTORY: Social History   Socioeconomic History  . Marital status: Divorced    Spouse name: Not on file  . Number of children: 2  . Years of education: 1812  . Highest education level: Not on file  Occupational History    Comment: retired Naval architecttruck driver  Social Needs  . Financial resource strain: Not on file  . Food insecurity:    Worry: Not on file    Inability: Not on file  . Transportation needs:    Medical: Not on file    Non-medical: Not on file  Tobacco Use  . Smoking  status: Former Games developer  . Smokeless tobacco: Never Used  . Tobacco comment: quit many years ago  Substance and Sexual Activity  . Alcohol use: Yes    Comment: 02/16/2015 "last alcohol was in ~ 2012"  . Drug use: No  . Sexual activity: Not Currently  Lifestyle  . Physical activity:    Days per week: Not on file    Minutes per session: Not on file  . Stress: Not on file  Relationships  . Social connections:    Talks on phone: Not on file    Gets together: Not on file    Attends religious service: Not on file    Active member of club or organization: Not on file    Attends meetings of clubs or organizations: Not on file    Relationship status: Not on file  . Intimate partner violence:    Fear of current or ex partner: Not on file    Emotionally abused: Not on file    Physically abused: Not on file    Forced sexual activity: Not on file  Other Topics Concern  . Not on file  Social History Narrative   Lives with daughter    caffeine -coffee,  1 cup daily     PHYSICAL EXAM  Vitals:   02/26/18 1436  BP: 102/69  Pulse: 76  Weight: 161  lb (73 kg)  Height: 5\' 6"  (1.676 m)   Body mass index is 25.99 kg/m.  Generalized: Well developed, in no acute distress  Head: normocephalic and atraumatic,. Oropharynx benign  Neck: Supple,Musculoskeletal: No deformity   Neurological examination   Mentation: Alert AFT 5.  MMSE - Mini Mental State Exam 09/12/2017 05/31/2017 05/03/2016  Orientation to time 1 1 3   Orientation to Place 4 3 5   Registration 3 3 3   Attention/ Calculation 5 3 0  Recall 0 0 1  Language- name 2 objects 2 2 2   Language- repeat 1 1 0  Language- follow 3 step command 3 3 2   Language- follow 3 step command-comments - - did not fold paper  Language- read & follow direction 1 1 1   Write a sentence 1 1 0  Copy design 0 0 0  Total score 21 18 17    Follows all commands speech and language fluent.   Cranial nerve II-XII: .Pupils were equal round reactive to light extraocular movements were full, visual field were full on confrontational test. Facial sensation and strength were normal. hearing was intact to finger rubbing bilaterally. Uvula tongue midline. head turning and shoulder shrug were normal and symmetric.Tongue protrusion into cheek strength was normal. Motor: normal bulk and tone, full strength in the BUE, BLE, fine finger movements normal, no pronator drift. No focal weakness Sensory: normal and symmetric to light touch,   Coordination: finger-nose-finger, heel-to-shin bilaterally, no dysmetria Reflexes: Brachioradialis 2/2, biceps 2/2, triceps 2/2, patellar 2/2, Achilles 2/2, plantar responses were flexor bilaterally. Gait and Station: Rising up from seated position without assistance, normal stance,  moderate stride, good arm swing, smooth turning   DIAGNOSTIC DATA (LABS, IMAGING, TESTING) - I reviewed patient records, labs, notes, testing and imaging myself where available.  Lab Results  Component Value Date   WBC 8.5 02/19/2018   HGB 13.2 02/19/2018   HCT 42.6 02/19/2018   MCV 90.1 02/19/2018    PLT 229 02/19/2018      Component Value Date/Time   NA 137 02/19/2018 1216   K 4.3 02/19/2018 1216   CL 104 02/19/2018 1216  CO2 23 02/19/2018 1216   GLUCOSE 100 (H) 02/19/2018 1216   BUN 14 02/19/2018 1216   CREATININE 1.70 (H) 02/19/2018 1216   CALCIUM 9.2 02/19/2018 1216   PROT 7.1 02/19/2018 1216   ALBUMIN 3.7 02/19/2018 1216   AST 15 02/19/2018 1216   ALT 13 02/19/2018 1216   ALKPHOS 67 02/19/2018 1216   BILITOT 0.4 02/19/2018 1216   GFRNONAA 41 (L) 02/19/2018 1216   GFRAA 47 (L) 02/19/2018 1216   Lab Results  Component Value Date   CHOL 186 03/18/2012   HDL 72 03/18/2012   LDLCALC 105 (H) 03/18/2012   TRIG 43 03/18/2012   CHOLHDL 2.6 03/18/2012   Lab Results  Component Value Date   HGBA1C 5.6 02/16/2016   Lab Results  Component Value Date   VITAMINB12 402 02/16/2016   Lab Results  Component Value Date   TSH 2.120 12/28/2015   ASSESSMENT AND PLAN  67 y.o. year old male  Dementia New onset parkinsonian features Complex partial seizure  After discussed with him and his family, want to continue with Depakote ER 500 mg 2 tablets every night  Sinemet 25/100 mg 3 times daily  Referral home physical therapy    He is on polypharmacy treatment now, if he continue to have recurrent seizure, we may consider add on lamotrigine 50 mg twice a day, stop Namenda, Lexapro to avoid polypharmacy treatment    Levert Feinstein, M.D. Ph.D.  [facility] [address] [place], [place] [address] Phone: [phone] Fax:      [phone]

## 2018-03-03 ENCOUNTER — Emergency Department (HOSPITAL_COMMUNITY): Payer: Medicare HMO

## 2018-03-03 ENCOUNTER — Emergency Department (HOSPITAL_COMMUNITY)
Admission: EM | Admit: 2018-03-03 | Discharge: 2018-03-03 | Disposition: A | Discharge status: Home / Self Care | Payer: Medicare HMO | Attending: Emergency Medicine | Admitting: Emergency Medicine

## 2018-03-03 ENCOUNTER — Other Ambulatory Visit: Payer: Self-pay

## 2018-03-03 ENCOUNTER — Encounter (HOSPITAL_COMMUNITY): Payer: Self-pay | Admitting: *Deleted

## 2018-03-03 DIAGNOSIS — I2510 Atherosclerotic heart disease of native coronary artery without angina pectoris: Secondary | ICD-10-CM | POA: Insufficient documentation

## 2018-03-03 DIAGNOSIS — Z7982 Long term (current) use of aspirin: Secondary | ICD-10-CM | POA: Insufficient documentation

## 2018-03-03 DIAGNOSIS — R569 Unspecified convulsions: Secondary | ICD-10-CM | POA: Diagnosis not present

## 2018-03-03 DIAGNOSIS — Z87891 Personal history of nicotine dependence: Secondary | ICD-10-CM | POA: Insufficient documentation

## 2018-03-03 DIAGNOSIS — Z8673 Personal history of transient ischemic attack (TIA), and cerebral infarction without residual deficits: Secondary | ICD-10-CM | POA: Insufficient documentation

## 2018-03-03 DIAGNOSIS — I10 Essential (primary) hypertension: Secondary | ICD-10-CM | POA: Insufficient documentation

## 2018-03-03 DIAGNOSIS — R4182 Altered mental status, unspecified: Secondary | ICD-10-CM | POA: Diagnosis present

## 2018-03-03 DIAGNOSIS — Z79899 Other long term (current) drug therapy: Secondary | ICD-10-CM | POA: Diagnosis not present

## 2018-03-03 HISTORY — DX: Unspecified dementia, unspecified severity, without behavioral disturbance, psychotic disturbance, mood disturbance, and anxiety: F03.90

## 2018-03-03 LAB — CBC WITH DIFFERENTIAL/PLATELET
Abs Immature Granulocytes: 0.02 10*3/uL (ref 0.00–0.07)
Basophils Absolute: 0 10*3/uL (ref 0.0–0.1)
Basophils Relative: 0 %
Eosinophils Absolute: 0.2 10*3/uL (ref 0.0–0.5)
Eosinophils Relative: 2 %
HCT: 41.4 % (ref 39.0–52.0)
Hemoglobin: 13.2 g/dL (ref 13.0–17.0)
Immature Granulocytes: 0 %
Lymphocytes Relative: 20 %
Lymphs Abs: 1.5 10*3/uL (ref 0.7–4.0)
MCH: 28.2 pg (ref 26.0–34.0)
MCHC: 31.9 g/dL (ref 30.0–36.0)
MCV: 88.5 fL (ref 80.0–100.0)
Monocytes Absolute: 0.9 10*3/uL (ref 0.1–1.0)
Monocytes Relative: 11 %
Neutro Abs: 5.1 10*3/uL (ref 1.7–7.7)
Neutrophils Relative %: 67 %
Platelets: 246 10*3/uL (ref 150–400)
RBC: 4.68 MIL/uL (ref 4.22–5.81)
RDW: 14.2 % (ref 11.5–15.5)
WBC: 7.7 10*3/uL (ref 4.0–10.5)
nRBC: 0 % (ref 0.0–0.2)

## 2018-03-03 LAB — COMPREHENSIVE METABOLIC PANEL
ALT: 19 U/L (ref 0–44)
AST: 21 U/L (ref 15–41)
Albumin: 3.6 g/dL (ref 3.5–5.0)
Alkaline Phosphatase: 72 U/L (ref 38–126)
Anion gap: 12 (ref 5–15)
BUN: 20 mg/dL (ref 8–23)
CO2: 25 mmol/L (ref 22–32)
Calcium: 9.3 mg/dL (ref 8.9–10.3)
Chloride: 101 mmol/L (ref 98–111)
Creatinine, Ser: 1.43 mg/dL — ABNORMAL HIGH (ref 0.61–1.24)
GFR calc Af Amer: 58 mL/min — ABNORMAL LOW (ref >60)
GFR calc non Af Amer: 50 mL/min — ABNORMAL LOW (ref >60)
Glucose, Bld: 110 mg/dL — ABNORMAL HIGH (ref 70–99)
Potassium: 4.3 mmol/L (ref 3.5–5.1)
Sodium: 138 mmol/L (ref 135–145)
Total Bilirubin: 0.6 mg/dL (ref 0.3–1.2)
Total Protein: 6.9 g/dL (ref 6.5–8.1)

## 2018-03-03 LAB — URINALYSIS, ROUTINE W REFLEX MICROSCOPIC
Bilirubin Urine: NEGATIVE
Glucose, UA: NEGATIVE mg/dL
Hgb urine dipstick: NEGATIVE
KETONES UR: 5 mg/dL — AB
LEUKOCYTES UA: NEGATIVE
Nitrite: NEGATIVE
Protein, ur: NEGATIVE mg/dL
Specific Gravity, Urine: 1.023 (ref 1.005–1.030)
pH: 6 (ref 5.0–8.0)

## 2018-03-03 LAB — VALPROIC ACID LEVEL: Valproic Acid Lvl: 64 ug/mL (ref 50.0–100.0)

## 2018-03-03 MED ORDER — LAMOTRIGINE 100 MG PO TABS: 50.0000 mg | ORAL_TABLET | Freq: Two times a day (BID) | ORAL | 0 refills | Status: DC

## 2018-03-03 NOTE — ED Notes (Signed)
Pt stable, ambulatory, and verbalizes understanding of d/c instructions.   Pt unable to sign due to lack of working signature pad.

## 2018-03-03 NOTE — ED Provider Notes (Signed)
The patient is a 67 year old male, thought to have complex partial seizures based on neurology evaluation in my review of the medical record.  Presents to the hospital after having another episode where he went into an abnormal state of consciousness, included in this was a possible syncopal episode but the patient was helped to the bed by family, went unresponsive, there was some eye flickering, this lasted several minutes followed by the patient being confused and not able to speak at his baseline.  He gradually improved and now feels like he is back to normal.  According to a family member this is happened in the past and based on the medical records it appears that this is happening several times which led up to a neurology visit and recommendations to potentially start Lamictal if he continued to have these episodes.  On my exam the patient is alert and oriented and able to follow commands and answer questions.  His neurologic exam is unremarkable, speech is clear, strength is normal in all 4 extremities.  I do not see any signs of trauma.  Patient agreeable to follow the change in plan unless there is another source of this on exam labs or testing.  The patient is in no distress at this time.    EKG Interpretation  Date/Time:  Sunday March 03 2018 10:34:14 EST Ventricular Rate:  69 PR Interval:    QRS Duration: 146 QT Interval:  427 QTC Calculation: 458 R Axis:   -74 Text Interpretation:  Sinus rhythm RBBB and LAFB since last tracing no significant change Confirmed by Eber HongMiller, Sharesa Kemp ([id number]) on 03/03/2018 10:45:06 AM       Medical screening examination/treatment/procedure(s) were conducted as a shared visit with non-physician practitioner(s) and myself.  I personally evaluated the patient during the encounter.  Clinical Impression:   Final diagnoses:  Seizure-like activity (HCC)         Eber HongMiller, Christen Bedoya, MD 03/04/18 Jerene Bears1920

## 2018-03-03 NOTE — ED Provider Notes (Signed)
[facility] Provider Note   CSN: [id number] Arrival date & time: 03/03/18  1001     History   Chief Complaint Chief Complaint  Patient presents with  . Altered Mental Status    HPI Tyrone Noble is a 67 y.o. male.  HPI  67 year old male, with a PMH of dementia, CAD with stent placement, HTN, presents status post episode of patient being unresponsive.  Per daughter at bedside patient was getting ready for church when he became wobbly.  She helped him to the bed.  She states he then look straight ahead and did not move his extremities or respond to her.  She states he was breathing and had a pulse the whole time.  When I asked the patient about this episode he states he became dizzy and then was lowered to the bed.  He states he remembers the whole episode, he remembers being lowered to the bed and his daughter talking to him.  He denies any LOC.  Patient and family deny any bowel or bladder incontinence, dental trauma.  He does have a history of seizures and has been compliant with his medication.  Daughter denies any seizure-like activity during episode.  Patient denies any dizziness currently, chest pain, shortness of breath, nausea, vomiting, abdominal pain, fevers, chills.  He is oriented to person and place at baseline and is currently oriented at baseline.  Daughter states he is acting normally.  She notes he has had a gradual decline over the last 3 months with increased intermittent episodes such as today.  Patient was seen earlier this month for a similar episode to today and has been seen by neurology.   Past Medical History:  Diagnosis Date  . Coronary artery disease   . Dementia (HCC)   . Depression   . Headache    "monthly" (02/16/2015)  . Heart disease   . Hypertension   . Memory loss   . Stroke [facility](HCC)    "years ago" (02/16/2015)    Patient Active Problem List   Diagnosis Date Noted  . Parkinsonism (HCC) 02/26/2018  .  Therapeutic drug monitoring 09/12/2017  . Seizures (HCC) 05/31/2017  . Dementia without behavioral disturbance (HCC) 05/31/2017  . New-onset angina (HCC) 02/16/2015  . HYPERLIPIDEMIA 12/13/2009  . ALCOHOLISM 07/07/2009  . CALLUS, TOE 01/03/2007  . DEPRESSION, MAJOR, RECURRENT 05/17/2006  . TOBACCO DEPENDENCE 05/17/2006  . HYPERTENSION, BENIGN SYSTEMIC 05/17/2006  . HEMORRHOIDS, NOS 05/17/2006  . GASTROESOPHAGEAL REFLUX, NO ESOPHAGITIS 05/17/2006  . LEG PAIN OR KNEE PAIN 05/17/2006    Past Surgical History:  Procedure Laterality Date  . CARDIAC CATHETERIZATION N/A 02/16/2015   Procedure: Left Heart Cath and Coronary Angiography;  Surgeon: Rinaldo CloudMohan Harwani, MD;  [facility];  Service: Cardiovascular;  Laterality: N/A;  . CARDIAC CATHETERIZATION N/A 02/16/2015   Procedure: Coronary Stent Intervention;  Surgeon: Rinaldo CloudMohan Harwani, MD;  [facility]: [facility]VASIVE CV LAB;  Service: Cardiovascular;  Laterality: N/A;  . CARDIAC CATHETERIZATION N/A 02/16/2015   Procedure: Intravascular Pressure Wire/FFR Study;  Surgeon: Rinaldo CloudMohan Harwani, MD;  [facility];  Service: Cardiovascular;  Laterality: N/A;  . CORONARY ANGIOPLASTY    . LEFT HEART CATH AND CORONARY ANGIOGRAPHY N/A 03/29/2017   Procedure: LEFT HEART CATH AND CORONARY ANGIOGRAPHY;  Surgeon: Rinaldo CloudHarwani, Mohan, MD;  Location: [facility]VAS[facility] CV LAB;  Service: Cardiovascular;  Laterality: N/A;  . LEFT HEART CATHETERIZATION WITH CORONARY ANGIOGRAM N/A 03/19/2012   Procedure: LEFT HEART CATHETERIZATION WITH CORONARY ANGIOGRAM;  Surgeon: Robynn Pane, MD;  Location: [facility] CATH LAB;  Service: Cardiovascular;  Laterality: N/A;  . WISDOM TOOTH EXTRACTION          Home Medications    Prior to Admission medications   Medication Sig Start Date End Date Taking? Authorizing Provider  amLODipine (NORVASC) 5 MG tablet Take 1 tablet (5 mg total) by mouth daily. Patient taking differently: Take 10 mg by mouth daily.  02/17/15   Rinaldo Cloud, MD  aspirin 81 MG chewable tablet Chew 1 tablet (81 mg total) by mouth daily. 02/17/15   Rinaldo Cloud, MD  atorvastatin (LIPITOR) 10 MG tablet Take 10 mg by mouth daily. 05/08/17   [provider]  carbidopa-levodopa (SINEMET IR) 25-100 MG tablet Take 1 tablet by mouth 3 (three) times daily. 02/26/18   Levert Feinstein, MD  divalproex (DEPAKOTE ER) 500 MG 24 hr tablet Take 2 tablets (1,000 mg total) by mouth at bedtime. 02/26/18   Levert Feinstein, MD  escitalopram (LEXAPRO) 10 MG tablet Take 10 mg by mouth daily.  01/30/16   [provider]  lisinopril (PRINIVIL,ZESTRIL) 20 MG tablet Take 1 tablet (20 mg total) by mouth daily. 02/17/15   Rinaldo Cloud, MD  memantine (NAMENDA) 5 MG tablet TAKE 1 TABLET BY MOUTH TWICE A DAY 10/26/17   Nilda Riggs, NP  metoprolol succinate (TOPROL-XL) 25 MG 24 hr tablet Take 25 mg by mouth daily. 11/19/15   [provider]  Multiple Vitamins-Minerals (MULTIVITAMIN WITH MINERALS) tablet Take 1 tablet by mouth daily.    [provider]  risperiDONE (RISPERDAL) 1 MG tablet Take 1 tablet (1 mg total) by mouth at bedtime. 02/26/18   Levert Feinstein, MD    Family History Family History  Problem Relation Age of Onset  . Diabetes Mother   . Hypertension Mother   . Stroke Father   . Cancer Sister     Social History Social History   Tobacco Use  . Smoking status: Former Games developer  . Smokeless tobacco: Never Used  . Tobacco comment: quit many years ago  Substance Use Topics  . Alcohol use: Yes    Comment: 02/16/2015 "last alcohol was in ~ 2012"  . Drug use: No     Allergies   Aricept [donepezil hcl] and Penicillins   Review of Systems Review of Systems  Constitutional: Negative for chills and fever.  HENT: Negative for rhinorrhea and sore throat.   Eyes: Negative for visual disturbance.  Respiratory: Negative for cough and shortness of breath.   Cardiovascular: Negative for chest pain and leg swelling.    Gastrointestinal: Negative for abdominal pain, diarrhea, nausea and vomiting.  Genitourinary: Negative for dysuria, frequency and urgency.  Musculoskeletal: Negative for joint swelling and neck pain.  Skin: Negative for rash and wound.  Neurological: Positive for dizziness and seizures. Negative for syncope, weakness and numbness.  All other systems reviewed and are negative.    Physical Exam Updated Vital Signs BP (!) 143/73 (BP Location: Right Arm)   Pulse 70   Resp 18   SpO2 99%   Physical Exam Vitals signs and nursing note reviewed.  Constitutional:      Appearance: He is well-developed.  HENT:     Head: Normocephalic and atraumatic.  Eyes:     General: No visual field deficit.    Conjunctiva/sclera: Conjunctivae normal.     Pupils: Pupils are equal, round, and reactive to light.     Visual Fields: Right eye visual fields normal and left eye  visual fields normal.  Neck:     Musculoskeletal: Neck supple.  Cardiovascular:     Rate and Rhythm: Normal rate and regular rhythm.     Heart sounds: Normal heart sounds. No murmur.  Pulmonary:     Effort: Pulmonary effort is normal. No accessory muscle usage or respiratory distress.     Breath sounds: Normal breath sounds. No wheezing or rales.  Abdominal:     General: Bowel sounds are normal. There is no distension.     Palpations: Abdomen is soft.     Tenderness: There is no abdominal tenderness.  Musculoskeletal: Normal range of motion.        General: No tenderness or deformity.  Skin:    General: Skin is warm and dry.     Findings: No erythema or rash.  Neurological:     Mental Status: He is alert. Mental status is at baseline.     GCS: GCS eye subscore is 4. GCS verbal subscore is 5. GCS motor subscore is 6.     Cranial Nerves: No dysarthria.     Sensory: Sensation is intact.     Motor: Motor function is intact.     Coordination: Coordination is intact.  Psychiatric:        Behavior: Behavior normal.      ED  Treatments / Results  Labs (all labs ordered are listed, but only abnormal results are displayed) Labs Reviewed  URINE CULTURE  URINALYSIS, ROUTINE W REFLEX MICROSCOPIC  COMPREHENSIVE METABOLIC PANEL  CBC WITH DIFFERENTIAL/PLATELET  VALPROIC ACID LEVEL    EKG None  Radiology No results found.  Procedures Procedures (including critical care time)  Medications Ordered in ED Medications - No data to display   Initial Impression / Assessment and Plan / ED Course  I have reviewed the triage vital signs and the nursing notes.  Pertinent labs & imaging results that were available during my care of the patient were reviewed by me and considered in my medical decision making (see chart for details).  Clinical Course as of Mar 03 1252  Sun Mar 03, 2018  1222 Resting comfortably in bed, no acute distress.  He states he is feeling much better at this time.  Blood work unremarkable, creatinine improved from 2 weeks ago.  Waiting on urine.   [CK]    Clinical Course User Index [CK] Sherryl Manges S, PA-C    Patient resting comfortably in bed, no acute distress, nontoxic, non-lethargic.  Patient continues to do be at baseline, stating he is feeling better.  UA shows no UTI.  Chest x-ray shows no pneumonia.  Do not believe there is infectious process at this time.  No evidence of electrolyte abnormality causing patient's symptoms.  Per neurology's note from the office visit on 12/10.  If patient had repeat episodes, they recommended lamotrigine 50 mg twice daily and stopping Namenda and Lexapro.  Discussed this with the family and they are agreeable with this plan.  No evidence of stroke on physical exam, exam nonfocal, nonlateralizing.  Patient's symptoms most consistent with a complex partial seizure.  Given strict return precautions and encouraged close follow-up with neurology.  Patient and family agreeable.   At this time there does not appear to be any evidence of an acute emergency  medical condition and the patient appears stable for discharge with appropriate outpatient follow up.Diagnosis was discussed with patient who verbalizes understanding and is agreeable to discharge. Pt case discussed with Dr. Hyacinth Meeker who agrees with my plan.  Final Clinical Impressions(s) / ED Diagnoses   Final diagnoses:  None    ED Discharge Orders    None       Rueben Bash 03/03/18 1408    Eber Hong, MD 03/04/18 1921

## 2018-03-03 NOTE — Discharge Instructions (Addendum)
Start Lamictal as prescribed.  Stop Namenda and Lexapro.  Follow-up with your neurologist within a week for continued evaluation.  Return to the ED immediately for new or worsening symptoms, such as chest pain, shortness of breath, vomiting, fevers, confusion or any concerns at all.

## 2018-03-03 NOTE — ED Triage Notes (Signed)
Pt arrived by [facility] from home. Family reports that pt had episode of blank stare and did not talk x 5 minutes. Family lowered him down onto the bed, no fall. Pt states that he remembers entire episode and just "feels funny". Per family, pt is back at baseline at this time.

## 2018-03-04 LAB — URINE CULTURE: Culture: NO GROWTH

## 2018-03-07 ENCOUNTER — Telehealth: Payer: Self-pay | Admitting: Neurology

## 2018-03-07 NOTE — Telephone Encounter (Signed)
This patient is actually under Dr. Zannie CoveYan's care for seizures and dementia.  She will allow is his son-in-law, who cares for him, to have four days off per month.  I have returned the call to the Mr. Mayford [place] ([place] who will send over the ppw.  States he will be retiring in 9 months so the FMLA will get him through until he can provide full time care to his father-in-law.

## 2018-03-07 NOTE — Telephone Encounter (Signed)
Pt's son in law Alcario Droughtkel Williams [facility]) said he is still having mini strokes, he is not comfortable leaving the pt alone. His job has recommended he apply for FMLA. Please call to discuss.

## 2018-03-11 ENCOUNTER — Telehealth: Payer: Self-pay | Admitting: Neurology

## 2018-03-11 DIAGNOSIS — G20 Parkinson's disease: Secondary | ICD-10-CM

## 2018-03-11 DIAGNOSIS — R2689 Other abnormalities of gait and mobility: Secondary | ICD-10-CM

## 2018-03-11 DIAGNOSIS — F0390 Unspecified dementia without behavioral disturbance: Secondary | ICD-10-CM

## 2018-03-11 DIAGNOSIS — R569 Unspecified convulsions: Secondary | ICD-10-CM

## 2018-03-11 NOTE — Telephone Encounter (Signed)
Pt called stating he hadn't heard anything back regarding scheduling home PT, please advise.

## 2018-03-14 NOTE — Telephone Encounter (Signed)
New order placed in Epic. 

## 2018-03-14 NOTE — Addendum Note (Signed)
Addended by: Lindell SparKIRKMAN, MICHELLE C on: 03/14/2018 05:06 PM   Modules accepted: Orders

## 2018-03-14 NOTE — Telephone Encounter (Signed)
Marcelino DusterMichelle, it looks like this has been ordered incorrectly. I can send without a new order being put in.   I called the patients care giver Tyrone Noble(Ikel) to give him an update and let him know we are working on it. He was appreciative.

## 2018-03-27 ENCOUNTER — Telehealth: Payer: Self-pay | Admitting: Neurology

## 2018-03-27 ENCOUNTER — Telehealth: Payer: Self-pay | Admitting: *Deleted

## 2018-03-27 NOTE — Telephone Encounter (Signed)
Visiting RN Angelique Blonder @ [facility] is asking for a call from RN to discuss pt's medications.  Please call RN Angelique Blonder

## 2018-03-27 NOTE — Telephone Encounter (Signed)
Returned call to [place], Surveyor, mining.  She wanted to provide an update that the patient was started on Lamictal 100mg , 0.5 tablet BID at the hospital on 03/03/18.  He took the medication for 3-4 days and his family stopped it due to intolerable daytime somnolence.    Also, she will be faxing over order for Dr. Zannie Cove signature for a social worker to go to the home.

## 2018-03-27 NOTE — Addendum Note (Signed)
Addended by: Lindell Spar C on: 03/27/2018 01:40 PM   Modules accepted: Orders

## 2018-03-27 NOTE — Telephone Encounter (Signed)
Spoke to GrainfieldJennifer, who is a Engineer, civil (consulting)nurse with the [facility].  They are transitioning the patient to pre-filled blister packs with his medications to reduce confusion for his family.  She wanted to verify the prescriptions managed by Dr. Terrace ArabiaYan.  His pharmacy has now been [facility] [place]  They are going to [facility] from [facility].  Per Victorino DikeJennifer, the patient never started carbidopa-levodopa but they are working with them to clarify all this medications.

## 2018-03-28 NOTE — Telephone Encounter (Signed)
He needs a follow up visit in 6 months, it is Ok with Maralyn Sago

## 2018-03-28 NOTE — Telephone Encounter (Signed)
There is a pending follow up with Margie Ege, NP on 08/28/2018.  This appt was scheduled by his family.

## 2018-04-02 ENCOUNTER — Ambulatory Visit: Payer: Medicare HMO | Admitting: Nurse Practitioner

## 2018-04-17 ENCOUNTER — Other Ambulatory Visit: Payer: Self-pay | Admitting: *Deleted

## 2018-04-17 MED ORDER — CARBIDOPA-LEVODOPA 25-100 MG PO TABS: 1.0000 | ORAL_TABLET | Freq: Three times a day (TID) | ORAL | 11 refills | Status: DC

## 2018-04-17 MED ORDER — DIVALPROEX SODIUM ER 500 MG PO TB24: 1000.0000 mg | ORAL_TABLET | Freq: Every day | ORAL | 11 refills | Status: DC

## 2018-04-21 ENCOUNTER — Emergency Department (HOSPITAL_COMMUNITY)
Admission: EM | Admit: 2018-04-21 | Discharge: 2018-04-21 | Disposition: A | Discharge status: Home / Self Care | Payer: Medicare HMO | Attending: Emergency Medicine | Admitting: Emergency Medicine

## 2018-04-21 ENCOUNTER — Other Ambulatory Visit: Payer: Self-pay | Admitting: Neurology

## 2018-04-21 ENCOUNTER — Emergency Department (HOSPITAL_COMMUNITY): Payer: Medicare HMO

## 2018-04-21 ENCOUNTER — Other Ambulatory Visit: Payer: Self-pay

## 2018-04-21 DIAGNOSIS — Z79899 Other long term (current) drug therapy: Secondary | ICD-10-CM | POA: Insufficient documentation

## 2018-04-21 DIAGNOSIS — G20 Parkinson's disease: Secondary | ICD-10-CM | POA: Insufficient documentation

## 2018-04-21 DIAGNOSIS — Z7982 Long term (current) use of aspirin: Secondary | ICD-10-CM | POA: Diagnosis not present

## 2018-04-21 DIAGNOSIS — F0390 Unspecified dementia without behavioral disturbance: Secondary | ICD-10-CM | POA: Diagnosis not present

## 2018-04-21 DIAGNOSIS — I10 Essential (primary) hypertension: Secondary | ICD-10-CM | POA: Insufficient documentation

## 2018-04-21 DIAGNOSIS — Z8673 Personal history of transient ischemic attack (TIA), and cerebral infarction without residual deficits: Secondary | ICD-10-CM | POA: Diagnosis not present

## 2018-04-21 DIAGNOSIS — Z87891 Personal history of nicotine dependence: Secondary | ICD-10-CM | POA: Insufficient documentation

## 2018-04-21 DIAGNOSIS — R569 Unspecified convulsions: Secondary | ICD-10-CM | POA: Diagnosis not present

## 2018-04-21 DIAGNOSIS — E785 Hyperlipidemia, unspecified: Secondary | ICD-10-CM | POA: Diagnosis not present

## 2018-04-21 DIAGNOSIS — I2510 Atherosclerotic heart disease of native coronary artery without angina pectoris: Secondary | ICD-10-CM | POA: Diagnosis not present

## 2018-04-21 LAB — URINALYSIS, ROUTINE W REFLEX MICROSCOPIC
Bilirubin Urine: NEGATIVE
Glucose, UA: NEGATIVE mg/dL
Hgb urine dipstick: NEGATIVE
Ketones, ur: NEGATIVE mg/dL
Leukocytes, UA: NEGATIVE
Nitrite: NEGATIVE
Protein, ur: NEGATIVE mg/dL
Specific Gravity, Urine: 1.014 (ref 1.005–1.030)
pH: 7 (ref 5.0–8.0)

## 2018-04-21 LAB — BASIC METABOLIC PANEL
Anion gap: 9 (ref 5–15)
BUN: 10 mg/dL (ref 8–23)
CO2: 23 mmol/L (ref 22–32)
Calcium: 8.6 mg/dL — ABNORMAL LOW (ref 8.9–10.3)
Chloride: 107 mmol/L (ref 98–111)
Creatinine, Ser: 1.28 mg/dL — ABNORMAL HIGH (ref 0.61–1.24)
GFR calc Af Amer: >60 mL/min (ref >60)
GFR calc non Af Amer: 57 mL/min — ABNORMAL LOW (ref >60)
Glucose, Bld: 109 mg/dL — ABNORMAL HIGH (ref 70–99)
Potassium: 4.7 mmol/L (ref 3.5–5.1)
Sodium: 139 mmol/L (ref 135–145)

## 2018-04-21 LAB — RAPID URINE DRUG SCREEN, HOSP PERFORMED
Amphetamines: NOT DETECTED
Barbiturates: NOT DETECTED
Benzodiazepines: NOT DETECTED
Cocaine: NOT DETECTED
Opiates: NOT DETECTED
Tetrahydrocannabinol: NOT DETECTED

## 2018-04-21 LAB — CBC WITH DIFFERENTIAL/PLATELET
Abs Immature Granulocytes: 0.03 10*3/uL (ref 0.00–0.07)
Basophils Absolute: 0 10*3/uL (ref 0.0–0.1)
Basophils Relative: 0 %
Eosinophils Absolute: 0.1 10*3/uL (ref 0.0–0.5)
Eosinophils Relative: 1 %
HCT: 38.3 % — ABNORMAL LOW (ref 39.0–52.0)
Hemoglobin: 12.2 g/dL — ABNORMAL LOW (ref 13.0–17.0)
Immature Granulocytes: 0 %
Lymphocytes Relative: 16 %
Lymphs Abs: 1.3 10*3/uL (ref 0.7–4.0)
MCH: 28.4 pg (ref 26.0–34.0)
MCHC: 31.9 g/dL (ref 30.0–36.0)
MCV: 89.3 fL (ref 80.0–100.0)
Monocytes Absolute: 0.7 10*3/uL (ref 0.1–1.0)
Monocytes Relative: 8 %
Neutro Abs: 6.3 10*3/uL (ref 1.7–7.7)
Neutrophils Relative %: 75 %
Platelets: 199 10*3/uL (ref 150–400)
RBC: 4.29 MIL/uL (ref 4.22–5.81)
RDW: 14.9 % (ref 11.5–15.5)
WBC: 8.4 10*3/uL (ref 4.0–10.5)
nRBC: 0 % (ref 0.0–0.2)

## 2018-04-21 LAB — ETHANOL: Alcohol, Ethyl (B): <10 mg/dL (ref <10)

## 2018-04-21 LAB — CBG MONITORING, ED: Glucose-Capillary: 84 mg/dL (ref 70–99)

## 2018-04-21 LAB — VALPROIC ACID LEVEL: Valproic Acid Lvl: 59 ug/mL (ref 50.0–100.0)

## 2018-04-21 MED ORDER — LEVETIRACETAM 500 MG PO TABS: 500.0000 mg | ORAL_TABLET | Freq: Two times a day (BID) | ORAL | 0 refills | Status: DC

## 2018-04-21 MED ORDER — DIVALPROEX SODIUM ER 500 MG PO TB24: 1000.0000 mg | ORAL_TABLET | Freq: Every day | ORAL | 0 refills | Status: DC

## 2018-04-21 MED ORDER — LEVETIRACETAM IN NACL 1000 MG/100ML IV SOLN
1000.0000 mg | Freq: Once | INTRAVENOUS | Status: AC
  Administered 2018-04-21: 1000 mg via INTRAVENOUS
  Filled 2018-04-21: qty 100

## 2018-04-21 MED ORDER — LISINOPRIL 20 MG PO TABS: 20.0000 mg | ORAL_TABLET | Freq: Every day | ORAL | 0 refills | Status: DC

## 2018-04-21 NOTE — Discharge Instructions (Addendum)
Start taking Keppra twice daily in addition to your home medicines.  I have sent refills of your divalproex and lisinopril to your pharmacy.  You can also speak with the pharmacy to see how many refills you have left or call the provider prescribing these medications.  Follow-up with your neurologist for reevaluation of your symptoms soon as possible.  Avoid any activities that could be dangerous to do if you have a seizure of such as holding young children, swimming, or driving.  Return to the emergency department if any concerning signs or symptoms develop such as altered mental status, fevers, weakness, or loss of consciousness.

## 2018-04-21 NOTE — ED Notes (Signed)
Patient transported to CT 

## 2018-04-21 NOTE — ED Provider Notes (Addendum)
[facility] Provider Note   CSN: [id number] Arrival date & time: 04/21/18  [id number]     History   Chief Complaint Chief Complaint  Patient presents with  . Seizures    HPI Jyl Heinzheodore F Silvestro is a 68 y.o. male with history of CAD, dementia, HTN, GERD presents for evaluation of acute onset, resolved seizure-like activity.  Patient reports that at around 9:30 AM he was sitting eating breakfast when he began to feel unwell.  His daughter who is at the bedside and is his primary caretaker reports that he had 2 episodes of generalized tonic-clonic seizure activity lasting a few minutes at a time and with an episode of unresponsiveness in between in which he was not back to his baseline.  She reports that the 2 episodes altogether probably lasted somewhere between 5 and 10 minutes.  She reports the patient had a postictal period afterwards.  No urinary incontinence or tongue injury.  No head injury and family was able to lower him to the ground.  Presently the patient is complaining of some lightheadedness and nausea but otherwise reports he is feeling well.  He denies headache, vision changes, numbness, weakness, chest pain, shortness of breath, abdominal pain.  Upon chart review, it appears he was seen by his neurologist on 02/26/2018 who noted some new onset parkinsonian features and started him on Sinemet 3 times daily.  He has been on Depakote which he has been taking as prescribed.  He was seen in the ED 5 days after that office visit with seizure-like activity and was started on Lamictal and instructed to stop his Namenda and Lexapro to avoid polypharmacy treatment.  Family reports that he did not tolerate the Lamictal and so it was discontinued and they have not resumed his Namenda or Lexapro.   The history is provided by the patient and a relative.    Past Medical History:  Diagnosis Date  . Coronary artery disease   . Dementia (HCC)   . Depression     . Headache    "monthly" (02/16/2015)  . Heart disease   . Hypertension   . Memory loss   . Stroke [facility](HCC)    "years ago" (02/16/2015)    Patient Active Problem List   Diagnosis Date Noted  . Parkinsonism (HCC) 02/26/2018  . Therapeutic drug monitoring 09/12/2017  . Seizures (HCC) 05/31/2017  . Dementia without behavioral disturbance (HCC) 05/31/2017  . New-onset angina (HCC) 02/16/2015  . HYPERLIPIDEMIA 12/13/2009  . ALCOHOLISM 07/07/2009  . CALLUS, TOE 01/03/2007  . DEPRESSION, MAJOR, RECURRENT 05/17/2006  . TOBACCO DEPENDENCE 05/17/2006  . HYPERTENSION, BENIGN SYSTEMIC 05/17/2006  . HEMORRHOIDS, NOS 05/17/2006  . GASTROESOPHAGEAL REFLUX, NO ESOPHAGITIS 05/17/2006  . LEG PAIN OR KNEE PAIN 05/17/2006    Past Surgical History:  Procedure Laterality Date  . CARDIAC CATHETERIZATION N/A 02/16/2015   Procedure: Left Heart Cath and Coronary Angiography;  Surgeon: Rinaldo CloudMohan Harwani, MD;  [facility];  Service: Cardiovascular;  Laterality: N/A;  . CARDIAC CATHETERIZATION N/A 02/16/2015   Procedure: Coronary Stent Intervention;  Surgeon: Rinaldo CloudMohan Harwani, MD;  Location: [facility] INVASIVE CV LAB;  Service: Cardiovascular;  Laterality: N/A;  . CARDIAC CATHETERIZATION N/A 02/16/2015   Procedure: Intravascular Pressure Wire/FFR Study;  Surgeon: Rinaldo CloudMohan Harwani, MD;  [facility];  Service: Cardiovascular;  Laterality: N/A;  . CORONARY ANGIOPLASTY    . LEFT HEART CATH AND CORONARY ANGIOGRAPHY N/A 03/29/2017   Procedure: LEFT HEART CATH AND CORONARY ANGIOGRAPHY;  Surgeon: Rinaldo Cloud, MD;  Location: [facility] INVAS[facility] CV LAB;  Service: Cardiovascular;  Laterality: N/A;  . LEFT HEART CATHETERIZATION WITH CORONARY ANGIOGRAM N/A 03/19/2012   Procedure: LEFT HEART CATHETERIZATION WITH CORONARY ANGIOGRAM;  Surgeon: Robynn Pane, MD;  Location: [facility] CATH LAB;  Service: Cardiovascular;  Laterality: N/A;  . WISDOM TOOTH EXTRACTION          Home Medications    Prior to Admission  medications   Medication Sig Start Date End Date Taking? Authorizing Provider  amLODipine (NORVASC) 5 MG tablet Take 1 tablet (5 mg total) by mouth daily. 02/17/15  Yes Rinaldo Cloud, MD  aspirin 81 MG chewable tablet Chew 1 tablet (81 mg total) by mouth daily. 02/17/15  Yes Rinaldo Cloud, MD  atorvastatin (LIPITOR) 20 MG tablet Take 20 mg by mouth daily.  05/08/17  Yes [provider]  carbidopa-levodopa (SINEMET IR) 25-100 MG tablet Take 1 tablet by mouth 3 (three) times daily. 04/17/18  Yes Levert Feinstein, MD  metoprolol succinate (TOPROL-XL) 25 MG 24 hr tablet Take 25 mg by mouth daily. 11/19/15  Yes [provider]  Multiple Vitamins-Minerals (MULTIVITAMIN WITH MINERALS) tablet Take 1 tablet by mouth daily.   Yes [provider]  risperiDONE (RISPERDAL) 1 MG tablet Take 1 tablet (1 mg total) by mouth at bedtime. Patient taking differently: Take 1 mg by mouth daily.  02/26/18  Yes Levert Feinstein, MD  divalproex (DEPAKOTE ER) 500 MG 24 hr tablet Take 2 tablets (1,000 mg total) by mouth at bedtime. 04/21/18   Luevenia Maxin, Taos Tapp A, PA-C  levETIRAcetam (KEPPRA) 500 MG tablet Take 1 tablet (500 mg total) by mouth 2 (two) times daily. 04/21/18   Luevenia Maxin, Hasani Diemer A, PA-C  lisinopril (PRINIVIL,ZESTRIL) 20 MG tablet Take 1 tablet (20 mg total) by mouth daily. 04/21/18   Jeanie Sewer, PA-C    Family History Family History  Problem Relation Age of Onset  . Diabetes Mother   . Hypertension Mother   . Stroke Father   . Cancer Sister     Social History Social History   Tobacco Use  . Smoking status: Former Games developer  . Smokeless tobacco: Never Used  . Tobacco comment: quit many years ago  Substance Use Topics  . Alcohol use: Yes    Comment: 02/16/2015 "last alcohol was in ~ 2012"  . Drug use: No     Allergies   Aricept [donepezil hcl] and Penicillins   Review of Systems Review of Systems  Constitutional: Negative for chills and fever.  Eyes: Negative for visual disturbance.    Respiratory: Negative for shortness of breath.   Cardiovascular: Negative for chest pain.  Gastrointestinal: Negative for abdominal pain, nausea and vomiting.  Neurological: Positive for seizures. Negative for weakness and headaches.  All other systems reviewed and are negative.    Physical Exam Updated Vital Signs BP 131/84   Pulse 70   Temp 97.8 F (36.6 C) (Oral)   Resp 14   Ht 5\' 7"  (1.702 m)   Wt 73 kg   SpO2 98%   BMI 25.21 kg/m   Physical Exam Vitals signs and nursing note reviewed.  Constitutional:      General: He is not in acute distress.    Appearance: He is well-developed.  HENT:     Head: Normocephalic and atraumatic.  Eyes:     General:        Right eye: No discharge.        Left eye: No discharge.  Extraocular Movements: Extraocular movements intact.     Conjunctiva/sclera: Conjunctivae normal.     Pupils: Pupils are equal, round, and reactive to light.  Neck:     Vascular: No JVD.     Trachea: No tracheal deviation.  Cardiovascular:     Rate and Rhythm: Normal rate and regular rhythm.     Pulses: Normal pulses.     Heart sounds: Normal heart sounds.  Pulmonary:     Effort: Pulmonary effort is normal.     Breath sounds: Normal breath sounds.  Abdominal:     General: Abdomen is flat. Bowel sounds are normal. There is no distension.     Tenderness: There is no abdominal tenderness. There is no guarding or rebound.  Musculoskeletal: Normal range of motion.        General: No swelling, tenderness or deformity.  Skin:    General: Skin is warm and dry.     Findings: No erythema.  Neurological:     Mental Status: He is alert.     Comments: Fluent speech with no evidence of dysarthria or aphasia.  Right-sided facial droop noted on examination.  Family believes that this is new however I did document this on my examination of the patient in March 2019. Oriented to person and place and the day of the week. His RN informs me that he did not know the  day of the week on her examination.   Able to follow 2 step commands without difficulty.   Cranial Nerves:  II:  Peripheral visual fields grossly normal, pupils equal, round, reactive to light III,IV, VI: ptosis not present, extra-ocular motions intact bilaterally  V,VII: facial light touch sensation equal VIII: hearing grossly normal to voice  X: uvula elevates symmetrically  XI: bilateral shoulder shrug symmetric and strong Motor:  Normal tone. 5/5 strength of BUE and BLE major muscle groups including strong and equal grip strength and dorsiflexion/plantar flexion.  No pronator drift though his extremities are tremulous with extension against gravity. Sensory: light touch normal in all extremities. Cerebellar: normal finger-to-nose with bilateral upper extremities, Romberg sign absent Gait: Slow shuffling gait.  Patient's daughter reports this is his baseline.  He can ambulate on his heels but not his toes.  Exhibits generally good balance.  Psychiatric:        Behavior: Behavior normal.      ED Treatments / Results  Labs (all labs ordered are listed, but only abnormal results are displayed) Labs Reviewed  BASIC METABOLIC PANEL - Abnormal; Notable for the following components:      Result Value   Glucose, Bld 109 (*)    Creatinine, Ser 1.28 (*)    Calcium 8.6 (*)    GFR calc non Af Amer 57 (*)    All other components within normal limits  CBC WITH DIFFERENTIAL/PLATELET - Abnormal; Notable for the following components:   Hemoglobin 12.2 (*)    HCT 38.3 (*)    All other components within normal limits  ETHANOL  RAPID URINE DRUG SCREEN, HOSP PERFORMED  VALPROIC ACID LEVEL  URINALYSIS, ROUTINE W REFLEX MICROSCOPIC  CBG MONITORING, ED    EKG None  Radiology Ct Head Wo Contrast  Result Date: 04/21/2018 CLINICAL DATA:  Seizure EXAM: CT HEAD WITHOUT CONTRAST TECHNIQUE: Contiguous axial images were obtained from the base of the skull through the vertex without intravenous  contrast. COMPARISON:  02/19/2017 FINDINGS: Brain: No evidence of acute infarction, hemorrhage, hydrocephalus, extra-axial collection or mass lesion/mass effect. Global cortical atrophy. Subcortical white  matter and periventricular small vessel ischemic changes. Vascular: No hyperdense vessel or unexpected calcification. Skull: Normal. Negative for fracture or focal lesion. Sinuses/Orbits: The visualized paranasal sinuses are essentially clear. The mastoid air cells are unopacified. Other: None. IMPRESSION: No evidence of acute intracranial abnormality. Atrophy with small vessel ischemic changes. Electronically Signed   By: Charline BillsSriyesh  Krishnan M.D.   On: 04/21/2018 11:36    Procedures Procedures (including critical care time)  Medications Ordered in ED Medications  levETIRAcetam (KEPPRA) IVPB 1000 mg/100 mL premix (0 mg Intravenous Stopped 04/21/18 1328)     Initial Impression / Assessment and Plan / ED Course  I have reviewed the triage vital signs and the nursing notes.  Pertinent labs & imaging results that were available during my care of the patient were reviewed by me and considered in my medical decision making (see chart for details).     Patient presenting for evaluation of seizure-like activity.  Has a history of dementia with parkinsonian features and seizures however EEG done last year showed no seizure-like activity.  He is afebrile, vital signs are stable.  He was initially hypoxic on room air while postictal but was weaned off of this and did not require any supplemental oxygen subsequently while in the ED.  On my assessment he appeared mildly confused but otherwise resting comfortably.  No focal neurologic deficits.  He did have a right-sided facial droop which family thought was new however this was present when I assessed the patient on 05/26/2017.  10:50 AM Spoke with Dr. Wilford CornerArora with neurology who has seen the patient in consultation previously and reviewed the patient's charts.  He  recommends obtaining noncontrast head CT due to acute neurological change.  He also recommends giving a 1 g loading dose of Keppra IV and discharging with 500 mg Keppra twice daily.  Low suspicion of stroke at this time.  Lab work reviewed by me shows no leukocytosis, mild anemia, mildly elevated creatinine at patient's baseline.  No metabolic derangements.  EtOH negative.  Valproic acid level therapeutic.  Head CT shows no acute intracranial abnormalities.  On reassessment patient is resting comfortably in no apparent distress.  Family reports that he is back to baseline and he reports that he is feeling well.  Discussed work-up and recommendation to begin Keppra twice daily.  Daughter requests refills of lisinopril and Depakote to be sent to his pharmacy.  Recommend close follow-up with his neurologist.  Discussed strict ED return precautions.  Patient and family verbalized understanding of and agreement with plan and patient is stable for discharge home at this time.  Discussed with Dr. Dalene SeltzerSchlossman who agrees with assessment and plan at this time. Final Clinical Impressions(s) / ED Diagnoses   Final diagnoses:  Seizure [facility](HCC)    ED Discharge Orders         Ordered    levETIRAcetam (KEPPRA) 500 MG tablet  2 times daily     04/21/18 1327    divalproex (DEPAKOTE ER) 500 MG 24 hr tablet  Daily at bedtime     04/21/18 1327    lisinopril (PRINIVIL,ZESTRIL) 20 MG tablet  Daily     04/21/18 1327            Jeanie SewerFawze, Beronica Lansdale A, PA-C 04/21/18 1404    Alvira MondaySchlossman, Erin, MD 04/22/18 [phone]

## 2018-04-21 NOTE — ED Triage Notes (Signed)
Pt BIB [facility]. Pt had a 3 minute grand mal seizure at home witnessed by family. Pt did vomit once during the seizure per family. Pt does have a pmh of seizures for which he takes depakote at home. Per EMS pt was 85% on room air and placed on 2L [place] by them. Pt also received of NSS following an initial blood pressure of 80/62. Pt is alert and oriented to person and place at this time. Pt denies any pain at this time.

## 2018-04-21 NOTE — ED Notes (Signed)
Patient verbalizes understanding of discharge instructions. Opportunity for questioning and answers were provided. Armband removed by staff, pt discharged from ED.  

## 2018-06-05 ENCOUNTER — Emergency Department (HOSPITAL_COMMUNITY): Payer: Medicare HMO

## 2018-06-05 ENCOUNTER — Emergency Department (HOSPITAL_COMMUNITY)
Admission: EM | Admit: 2018-06-05 | Discharge: 2018-06-05 | Disposition: A | Discharge status: Home / Self Care | Payer: Medicare HMO | Attending: Emergency Medicine | Admitting: Emergency Medicine

## 2018-06-05 ENCOUNTER — Other Ambulatory Visit: Payer: Self-pay

## 2018-06-05 ENCOUNTER — Encounter (HOSPITAL_COMMUNITY): Payer: Self-pay | Admitting: Emergency Medicine

## 2018-06-05 DIAGNOSIS — R140 Abdominal distension (gaseous): Secondary | ICD-10-CM | POA: Diagnosis not present

## 2018-06-05 DIAGNOSIS — J40 Bronchitis, not specified as acute or chronic: Secondary | ICD-10-CM | POA: Diagnosis not present

## 2018-06-05 DIAGNOSIS — Z87891 Personal history of nicotine dependence: Secondary | ICD-10-CM | POA: Insufficient documentation

## 2018-06-05 DIAGNOSIS — I10 Essential (primary) hypertension: Secondary | ICD-10-CM | POA: Diagnosis not present

## 2018-06-05 DIAGNOSIS — G20 Parkinson's disease: Secondary | ICD-10-CM | POA: Insufficient documentation

## 2018-06-05 DIAGNOSIS — Z8673 Personal history of transient ischemic attack (TIA), and cerebral infarction without residual deficits: Secondary | ICD-10-CM | POA: Diagnosis not present

## 2018-06-05 DIAGNOSIS — I2510 Atherosclerotic heart disease of native coronary artery without angina pectoris: Secondary | ICD-10-CM | POA: Diagnosis not present

## 2018-06-05 DIAGNOSIS — Z79899 Other long term (current) drug therapy: Secondary | ICD-10-CM | POA: Insufficient documentation

## 2018-06-05 DIAGNOSIS — R109 Unspecified abdominal pain: Secondary | ICD-10-CM | POA: Insufficient documentation

## 2018-06-05 DIAGNOSIS — R079 Chest pain, unspecified: Secondary | ICD-10-CM | POA: Diagnosis present

## 2018-06-05 DIAGNOSIS — Z7982 Long term (current) use of aspirin: Secondary | ICD-10-CM | POA: Diagnosis not present

## 2018-06-05 DIAGNOSIS — R4182 Altered mental status, unspecified: Secondary | ICD-10-CM | POA: Insufficient documentation

## 2018-06-05 LAB — URINALYSIS, ROUTINE W REFLEX MICROSCOPIC
Bilirubin Urine: NEGATIVE
Glucose, UA: NEGATIVE mg/dL
Hgb urine dipstick: NEGATIVE
Ketones, ur: 20 mg/dL — AB
LEUKOCYTE UA: NEGATIVE
NITRITE: NEGATIVE
Protein, ur: NEGATIVE mg/dL
SPECIFIC GRAVITY, URINE: 1.018 (ref 1.005–1.030)
pH: 5 (ref 5.0–8.0)

## 2018-06-05 LAB — CBC
HEMATOCRIT: 42.7 % (ref 39.0–52.0)
Hemoglobin: 14 g/dL (ref 13.0–17.0)
MCH: 29.6 pg (ref 26.0–34.0)
MCHC: 32.8 g/dL (ref 30.0–36.0)
MCV: 90.3 fL (ref 80.0–100.0)
PLATELETS: 198 10*3/uL (ref 150–400)
RBC: 4.73 MIL/uL (ref 4.22–5.81)
RDW: 14.8 % (ref 11.5–15.5)
WBC: 10.6 10*3/uL — ABNORMAL HIGH (ref 4.0–10.5)
nRBC: 0 % (ref 0.0–0.2)

## 2018-06-05 LAB — BASIC METABOLIC PANEL
Anion gap: 10 (ref 5–15)
BUN: 23 mg/dL (ref 8–23)
CO2: 24 mmol/L (ref 22–32)
CREATININE: 1.51 mg/dL — AB (ref 0.61–1.24)
Calcium: 9.4 mg/dL (ref 8.9–10.3)
Chloride: 102 mmol/L (ref 98–111)
GFR calc Af Amer: 54 mL/min — ABNORMAL LOW (ref >60)
GFR calc non Af Amer: 46 mL/min — ABNORMAL LOW (ref >60)
Glucose, Bld: 107 mg/dL — ABNORMAL HIGH (ref 70–99)
Potassium: 4.3 mmol/L (ref 3.5–5.1)
Sodium: 136 mmol/L (ref 135–145)

## 2018-06-05 LAB — INFLUENZA PANEL BY PCR (TYPE A & B)
Influenza A By PCR: NEGATIVE
Influenza B By PCR: NEGATIVE

## 2018-06-05 LAB — I-STAT TROPONIN, ED: Troponin i, poc: 0 ng/mL (ref 0.00–0.08)

## 2018-06-05 MED ORDER — SODIUM CHLORIDE 0.9 % IV BOLUS
1000.0000 mL | Freq: Once | INTRAVENOUS | Status: AC
  Administered 2018-06-05: 1000 mL via INTRAVENOUS

## 2018-06-05 MED ORDER — IOPAMIDOL (ISOVUE-300) INJECTION 61%
100.0000 mL | Freq: Once | INTRAVENOUS | Status: AC | PRN
  Administered 2018-06-05: 100 mL via INTRAVENOUS

## 2018-06-05 MED ORDER — ACETAMINOPHEN 325 MG PO TABS
650.0000 mg | ORAL_TABLET | Freq: Once | ORAL | Status: AC
  Administered 2018-06-05: 650 mg via ORAL
  Filled 2018-06-05: qty 2

## 2018-06-05 MED ORDER — SODIUM CHLORIDE 0.9% FLUSH: 3.0000 mL | Freq: Once | INTRAVENOUS | Status: DC

## 2018-06-05 MED ORDER — AZITHROMYCIN 250 MG PO TABS: 250.0000 mg | ORAL_TABLET | Freq: Every day | ORAL | 0 refills | Status: DC

## 2018-06-05 MED ORDER — IOPAMIDOL (ISOVUE-300) INJECTION 61%
INTRAVENOUS | Status: AC
  Filled 2018-06-05: qty 100

## 2018-06-05 MED ORDER — SODIUM CHLORIDE (PF) 0.9 % IJ SOLN
INTRAMUSCULAR | Status: AC
  Filled 2018-06-05: qty 50

## 2018-06-05 NOTE — ED Notes (Signed)
Condom cath placed on pt 

## 2018-06-05 NOTE — ED Triage Notes (Signed)
Patient intermittent central sharp chest pain with non productive cough since last night. Denies N/V/D. Reports weakness. Denies fevers and body aches.

## 2018-06-05 NOTE — Discharge Instructions (Addendum)
Take zpack as prescribed.   Take tylenol, motrin for fever.   Stay hydrated   See your doctor in 2-3 days   Return to ER if you have fever for a week, worse weakness, confusion, trouble breathing

## 2018-06-05 NOTE — ED Provider Notes (Signed)
  Physical Exam  BP (!) 166/86   Pulse (!) 107   Temp (!) 101 F (38.3 C) (Rectal)   Resp (!) 21   Ht 5\' 7"  (1.702 m)   Wt 68 kg   SpO2 99%   BMI 23.49 kg/m   Physical Exam  ED Course/Procedures     Procedures  MDM  Care assumed at 4 pm from Dr. Patria Mane.  Patient's here with diffuse weakness as well as nonproductive cough.  He is also more confused than usual.  Labs showed white count of 11.  His has a normal chest x-ray.  He did develop a fever 101 in the ED. Sign out pending flu test. Patient has no travel or exposure to anyone with known coronavirus.   6 pm Flu negative. UA nl. Per daughter, patient is more confused than usual and has some abdominal pain and diarrhea. Will get CT head/CT chest/ab/pel.   8:01 PM CTs negative. Confusion improved. I suspect viral illness, possible bronchitis. Will dc home with zpack for atypical pneumonia. Offered admission for observation vs close follow up and daughter wants to take patient home.        Charlynne Pander, MD 06/05/18 2002

## 2018-06-06 ENCOUNTER — Telehealth (INDEPENDENT_AMBULATORY_CARE_PROVIDER_SITE_OTHER): Payer: Medicare HMO | Admitting: Neurology

## 2018-06-06 ENCOUNTER — Telehealth: Payer: Self-pay | Admitting: Neurology

## 2018-06-06 DIAGNOSIS — G20 Parkinson's disease: Secondary | ICD-10-CM | POA: Diagnosis not present

## 2018-06-06 DIAGNOSIS — R569 Unspecified convulsions: Secondary | ICD-10-CM

## 2018-06-06 DIAGNOSIS — F0390 Unspecified dementia without behavioral disturbance: Secondary | ICD-10-CM

## 2018-06-06 LAB — URINE CULTURE: Culture: <10000 — AB

## 2018-06-06 MED ORDER — LEVETIRACETAM 500 MG PO TABS: 500.0000 mg | ORAL_TABLET | Freq: Two times a day (BID) | ORAL | 4 refills | Status: DC

## 2018-06-06 MED ORDER — LEVETIRACETAM 500 MG PO TABS: 500.0000 mg | ORAL_TABLET | Freq: Two times a day (BID) | ORAL | 11 refills | Status: DC

## 2018-06-06 MED ORDER — DIVALPROEX SODIUM ER 500 MG PO TB24: 1000.0000 mg | ORAL_TABLET | Freq: Every day | ORAL | 4 refills | Status: DC

## 2018-06-06 NOTE — Telephone Encounter (Signed)
Chief Complains/Reason for telephone visit: Follow up visit  Names of all people present and their role: Patient, his daughter Tyrone Noble and son in law Tyrone Noble.  Relevant History:  HISTORY OF PRESENT ILLNESS: Tyrone Noble is a 68 year old male, seen in refer by his primary care doctor Tyrone Noble, Tyrone Noble for evaluation of seizure, he is accompanied by his son in law Tyrone Noble at today's clinical visit, he has lived with his daughter's family for 5 years.  He had a past medical history of hypertension, hyperlipidemia, coronary artery disease, is a retired Naval architect, around [id number], he developed gradual onset memory loss, was forced to retire early due to gradual worsening memory loss, also had a past medical history of depression anxiety, was seen by my colleague Dr. Marjory Lies in 2017 for dementia, he did also have a history of alcohol abuse, in remission for many years.  He presented to the emergency room on May 26, 2017 for seizure, he was ready to have breakfast, without warning signs, he began to stare into the space, not responding, whole body shaking, foaming, out of his mouth, postevent confusion last for a few minutes, paramedic was called, he was taken to the emergency room, MRI of the brain showed no acute abnormality, generalized atrophy, moderate supratentorium small vessel disease. Laboratory evaluation seen March 2019 showed normal CBC, BMP showed elevated creatinine 1.37, GFR of 51, negative troponin, UA was normal Is now back to baseline, family denies significant agitations, Mini-Mental Status Examination only 18 out of 30 today  UPDATE 6/26/2019CM Tyrone Noble, 68 year old male returns for follow-up with history of seizure disorder and dementia.  Last seizure occurred in March he is currently on Depakote 500 at bedtime.  Since seen in March by Dr. Terrace Arabia, his Aricept was stopped due to nausea and vomiting as well as Namenda.  Memory score today 21 out of 30.  Last  18 out of 30.  EEG done 07/02/2017 was normal.MRI of the brain 05/26/17 showed no acute abnormality, generalized atrophy, moderate supratentorium small vessel disease. He continues to live with his daughter Tyrone Noble.  She reports that he has a good appetite and sleeping well at night no hallucinations no wandering behavior he returns for reevaluation  UPDATE Feb 26 2018: He is accompanied by his daughter and son in law at visit. He is overall stable,continue to complains of short term memory loss, slow walking since 2018, sleeps too much, poor appetite,   Last seizure was on Nov 30th 2019, he was awake, not responsive,  Feb 18 2018,  Sudden onset of disorientation confusion   He is taking Depakote DR 500 mg 2 tablets every night  TELEPHONE Visit June 06 2018: He was seen at ED on June 05 2018 for weakness, fever,cough, he has no trouble breathing, increased confusions, decreased appetite  He also presented to the emergency room on April 21, 2018 for recurrent seizure, Depakote level was 59 then, taking Depakote ER 500mg   2 tablets every night, Keppra 500mg  bid was added on,   Before his ED visit on March 18, he was tolerating Depakote and Keppra, no recurrent seizure, continue to have confusions   Results Reviewed: CT chest, abdomen, pelvis, no acute abnormalities.Emphysema CT head  atrophy, small vessel disease. Influenza panel was negative  Assessment and Plan: Dementia New onset parkinsonian features Complex partial seizure  After discussed with him and his family, want to continue with Depakote ER 500 mg 2 tablets every night and keppra 500mg  bid, refill his Rx.  Keep Sinemet 25/100 mg 3 times daily  Upper respirator infection:  On Antibiotic treatment  Documentation of Time of Medical Discussion: Total phone time 12 mintues

## 2018-06-06 NOTE — Telephone Encounter (Signed)
His son in Law Brigid Re will fax paper work for [employer]. He is the caregiver of this patient

## 2018-06-06 NOTE — Telephone Encounter (Signed)
We will complete ppw once received.

## 2018-06-08 NOTE — ED Provider Notes (Addendum)
[facility]-EMERGENCY DEPT Provider Note   CSN: [id number] Arrival date & time: 06/05/18  1309    History   Chief Complaint Chief Complaint  Patient presents with  . Chest Pain  . Cough  . Weakness    HPI Tyrone Noble is a 68 y.o. male.     HPI Patient is a 68 year old male with intermittent sharp chest discomfort and nonproductive cough since last night.  Generalized weakness.  Some mild confusion per family.  He has had generalized weakness.  No reported fevers.  Eating and drinking.  No focal arm or leg weakness.   Past Medical History:  Diagnosis Date  . Coronary artery disease   . Dementia (HCC)   . Depression   . Headache    "monthly" (02/16/2015)  . Heart disease   . Hypertension   . Memory loss   . Stroke [facility])    "years ago" (02/16/2015)    Patient Active Problem List   Diagnosis Date Noted  . Parkinsonism (HCC) 02/26/2018  . Therapeutic drug monitoring 09/12/2017  . Seizures (HCC) 05/31/2017  . Dementia without behavioral disturbance (HCC) 05/31/2017  . New-onset angina (HCC) 02/16/2015  . HYPERLIPIDEMIA 12/13/2009  . ALCOHOLISM 07/07/2009  . CALLUS, TOE 01/03/2007  . DEPRESSION, MAJOR, RECURRENT 05/17/2006  . TOBACCO DEPENDENCE 05/17/2006  . HYPERTENSION, BENIGN SYSTEMIC 05/17/2006  . HEMORRHOIDS, NOS 05/17/2006  . GASTROESOPHAGEAL REFLUX, NO ESOPHAGITIS 05/17/2006  . LEG PAIN OR KNEE PAIN 05/17/2006    Past Surgical History:  Procedure Laterality Date  . CARDIAC CATHETERIZATION N/A 02/16/2015   Procedure: Left Heart Cath and Coronary Angiography;  Surgeon: Rinaldo Cloud, MD;  Location: [facility] INVASIVE CV LAB;  Service: Cardiovascular;  Laterality: N/A;  . CARDIAC CATHETERIZATION N/A 02/16/2015   Procedure: Coronary Stent Intervention;  Surgeon: Rinaldo Cloud, MD;  Location: [facility] INVASIVE CV LAB;  Service: Cardiovascular;  Laterality: N/A;  . CARDIAC CATHETERIZATION N/A 02/16/2015   Procedure: Intravascular Pressure  Wire/FFR Study;  Surgeon: Rinaldo Cloud, MD;  Location: [facility] INVASIVE CV LAB;  Service: Cardiovascular;  Laterality: N/A;  . CORONARY ANGIOPLASTY    . LEFT HEART CATH AND CORONARY ANGIOGRAPHY N/A 03/29/2017   Procedure: LEFT HEART CATH AND CORONARY ANGIOGRAPHY;  Surgeon: Rinaldo Cloud, MD;  Location: [facility] INVASIVE CV LAB;  Service: Cardiovascular;  Laterality: N/A;  . LEFT HEART CATHETERIZATION WITH CORONARY ANGIOGRAM N/A 03/19/2012   Procedure: LEFT HEART CATHETERIZATION WITH CORONARY ANGIOGRAM;  Surgeon: Robynn Pane, MD;  Location: [facility] CATH LAB;  Service: Cardiovascular;  Laterality: N/A;  . WISDOM TOOTH EXTRACTION          Home Medications    Prior to Admission medications   Medication Sig Start Date End Date Taking? Authorizing Provider  amLODipine (NORVASC) 5 MG tablet Take 1 tablet (5 mg total) by mouth daily. 02/17/15  Yes Rinaldo Cloud, MD  aspirin 81 MG chewable tablet Chew 1 tablet (81 mg total) by mouth daily. 02/17/15  Yes Rinaldo Cloud, MD  atorvastatin (LIPITOR) 20 MG tablet Take 20 mg by mouth daily.  05/08/17  Yes [provider]  carbidopa-levodopa (SINEMET IR) 25-100 MG tablet Take 1 tablet by mouth 3 (three) times daily. 04/17/18  Yes Levert Feinstein, MD  lisinopril (PRINIVIL,ZESTRIL) 20 MG tablet Take 1 tablet (20 mg total) by mouth daily. 04/21/18  Yes Fawze, Mina A, PA-C  metoprolol succinate (TOPROL-XL) 25 MG 24 hr tablet Take 25 mg by mouth daily. 11/19/15  Yes [provider]  Multiple Vitamins-Minerals (MULTIVITAMIN WITH MINERALS) tablet Take  1 tablet by mouth daily.   Yes [provider]  risperiDONE (RISPERDAL) 1 MG tablet Take 1 tablet (1 mg total) by mouth at bedtime. Patient taking differently: Take 1 mg by mouth daily.  02/26/18  Yes Levert Feinstein, MD  azithromycin (ZITHROMAX) 250 MG tablet Take 1 tablet (250 mg total) by mouth daily. Take first 2 tablets together, then 1 every day until finished. 06/05/18   Charlynne Pander, MD  divalproex  (DEPAKOTE ER) 500 MG 24 hr tablet Take 2 tablets (1,000 mg total) by mouth at bedtime. 06/06/18   Levert Feinstein, MD  levETIRAcetam (KEPPRA) 500 MG tablet Take 1 tablet (500 mg total) by mouth 2 (two) times daily. 06/06/18   Levert Feinstein, MD    Family History Family History  Problem Relation Age of Onset  . Diabetes Mother   . Hypertension Mother   . Stroke Father   . Cancer Sister     Social History Social History   Tobacco Use  . Smoking status: Former Games developer  . Smokeless tobacco: Never Used  . Tobacco comment: quit many years ago  Substance Use Topics  . Alcohol use: Yes    Comment: 02/16/2015 "last alcohol was in ~ 2012"  . Drug use: No     Allergies   Aricept [donepezil hcl] and Penicillins   Review of Systems Review of Systems  All other systems reviewed and are negative.    Physical Exam Updated Vital Signs BP (!) 162/97   Pulse (!) 102   Temp 100 F (37.8 C) (Oral)   Resp 19   Ht 5\' 7"  (1.702 m)   Wt 68 kg   SpO2 97%   BMI 23.49 kg/m   Physical Exam Vitals signs and nursing note reviewed.  Constitutional:      Appearance: He is well-developed.  HENT:     Head: Normocephalic and atraumatic.  Neck:     Musculoskeletal: Normal range of motion.  Cardiovascular:     Rate and Rhythm: Normal rate and regular rhythm.     Heart sounds: Normal heart sounds.  Pulmonary:     Effort: Pulmonary effort is normal. No respiratory distress.     Breath sounds: Normal breath sounds.  Abdominal:     General: There is no distension.     Palpations: Abdomen is soft.     Tenderness: There is no abdominal tenderness.  Musculoskeletal: Normal range of motion.  Skin:    General: Skin is warm and dry.  Neurological:     Mental Status: He is alert and oriented to person, place, and time.  Psychiatric:        Judgment: Judgment normal.      ED Treatments / Results  Labs (all labs ordered are listed, but only abnormal results are displayed) Labs Reviewed  URINE  CULTURE - Abnormal; Notable for the following components:      Result Value   Culture   (*)        All other components within normal limits  BASIC METABOLIC PANEL - Abnormal; Notable for the following components:   Glucose, Bld 107 (*)    Creatinine, Ser 1.51 (*)    GFR calc non Af Amer 46 (*)    GFR calc Af Amer 54 (*)    All other components within normal limits  CBC - Abnormal; Notable for the following components:   WBC 10.6 (*)    All other components within normal limits  URINALYSIS, ROUTINE W REFLEX MICROSCOPIC - Abnormal;  Notable for the following components:   Ketones, ur 20 (*)    All other components within normal limits  INFLUENZA PANEL BY PCR (TYPE A & B)  I-STAT TROPONIN, ED    EKG EKG Interpretation  Date/Time:  Wednesday June 05 2018 13:16:44 EDT Ventricular Rate:  99 PR Interval:    QRS Duration: 139 QT Interval:  360 QTC Calculation: 462 R Axis:   -86 Text Interpretation:  Sinus rhythm RBBB and LAFB ST elevation, consider inferior injury No significant change since last tracing Confirmed by Richardean Canal [phone]) on 06/05/2018 4:59:44 PM   Radiology No results found.  Procedures Procedures (including critical care time)  Medications Ordered in ED Medications  sodium chloride 0.9 % bolus 1,000 mL (0 mLs Intravenous Stopped 06/05/18 1646)  acetaminophen (TYLENOL) tablet 650 mg (650 mg Oral Given 06/05/18 1642)  iopamidol (ISOVUE-300) 61 % injection 100 mL (100 mLs Intravenous Contrast Given 06/05/18 1855)     Initial Impression / Assessment and Plan / ED Course  I have reviewed the triage vital signs and the nursing notes.  Pertinent labs & imaging results that were available during my care of the patient were reviewed by me and considered in my medical decision making (see chart for details).        Overall well-appearing.  CT is pending at this time.  I suspect this is more of a viral illness.  Mild fever to 101.  Again likely viral syndrome.  No  recent sick contacts.  No covid exposure.  I suspect the patient can safely be discharged if CT scans are negative.  Care transferred to Dr Silverio Lay  Final Clinical Impressions(s) / ED Diagnoses   Final diagnoses:  Bronchitis    ED Discharge Orders         Ordered    azithromycin (ZITHROMAX) 250 MG tablet  Daily     06/05/18 Gwynneth Munson, MD 06/08/18 2134    Azalia Bilis, MD 06/08/18 2135

## 2018-06-10 ENCOUNTER — Ambulatory Visit: Payer: Medicare HMO | Admitting: Neurology

## 2018-06-10 ENCOUNTER — Encounter

## 2018-06-12 DIAGNOSIS — Z0289 Encounter for other administrative examinations: Secondary | ICD-10-CM

## 2018-06-12 NOTE — Telephone Encounter (Signed)
Paperwork completed, signed and returned to medical records.

## 2018-06-18 ENCOUNTER — Emergency Department (HOSPITAL_COMMUNITY): Payer: Medicare HMO

## 2018-06-18 ENCOUNTER — Emergency Department (HOSPITAL_COMMUNITY)
Admission: EM | Admit: 2018-06-18 | Discharge: 2018-06-18 | Disposition: A | Discharge status: Home / Self Care | Payer: Medicare HMO | Attending: Emergency Medicine | Admitting: Emergency Medicine

## 2018-06-18 ENCOUNTER — Encounter (HOSPITAL_COMMUNITY): Payer: Self-pay

## 2018-06-18 DIAGNOSIS — G20 Parkinson's disease: Secondary | ICD-10-CM | POA: Insufficient documentation

## 2018-06-18 DIAGNOSIS — F0390 Unspecified dementia without behavioral disturbance: Secondary | ICD-10-CM | POA: Insufficient documentation

## 2018-06-18 DIAGNOSIS — I959 Hypotension, unspecified: Secondary | ICD-10-CM

## 2018-06-18 DIAGNOSIS — I2510 Atherosclerotic heart disease of native coronary artery without angina pectoris: Secondary | ICD-10-CM | POA: Diagnosis not present

## 2018-06-18 DIAGNOSIS — I110 Hypertensive heart disease with heart failure: Secondary | ICD-10-CM | POA: Insufficient documentation

## 2018-06-18 DIAGNOSIS — R410 Disorientation, unspecified: Secondary | ICD-10-CM | POA: Insufficient documentation

## 2018-06-18 DIAGNOSIS — Z8673 Personal history of transient ischemic attack (TIA), and cerebral infarction without residual deficits: Secondary | ICD-10-CM | POA: Insufficient documentation

## 2018-06-18 DIAGNOSIS — Z79899 Other long term (current) drug therapy: Secondary | ICD-10-CM | POA: Diagnosis not present

## 2018-06-18 DIAGNOSIS — I509 Heart failure, unspecified: Secondary | ICD-10-CM | POA: Insufficient documentation

## 2018-06-18 DIAGNOSIS — Z87891 Personal history of nicotine dependence: Secondary | ICD-10-CM | POA: Diagnosis not present

## 2018-06-18 DIAGNOSIS — R569 Unspecified convulsions: Secondary | ICD-10-CM | POA: Diagnosis present

## 2018-06-18 DIAGNOSIS — Z7982 Long term (current) use of aspirin: Secondary | ICD-10-CM | POA: Diagnosis not present

## 2018-06-18 LAB — MAGNESIUM: Magnesium: 2.1 mg/dL (ref 1.7–2.4)

## 2018-06-18 LAB — COMPREHENSIVE METABOLIC PANEL
ALT: 7 U/L (ref 0–44)
AST: 19 U/L (ref 15–41)
Albumin: 3.6 g/dL (ref 3.5–5.0)
Alkaline Phosphatase: 61 U/L (ref 38–126)
Anion gap: 8 (ref 5–15)
BUN: 12 mg/dL (ref 8–23)
CO2: 22 mmol/L (ref 22–32)
Calcium: 8.5 mg/dL — ABNORMAL LOW (ref 8.9–10.3)
Chloride: 108 mmol/L (ref 98–111)
Creatinine, Ser: 1.13 mg/dL (ref 0.61–1.24)
GFR calc Af Amer: >60 mL/min (ref >60)
Glucose, Bld: 134 mg/dL — ABNORMAL HIGH (ref 70–99)
Potassium: 3.9 mmol/L (ref 3.5–5.1)
Sodium: 138 mmol/L (ref 135–145)
Total Bilirubin: 0.8 mg/dL (ref 0.3–1.2)
Total Protein: 6.8 g/dL (ref 6.5–8.1)

## 2018-06-18 LAB — CBC WITH DIFFERENTIAL/PLATELET
Abs Immature Granulocytes: 0.02 10*3/uL (ref 0.00–0.07)
Basophils Absolute: 0 10*3/uL (ref 0.0–0.1)
Basophils Relative: 1 %
Eosinophils Absolute: 0.2 10*3/uL (ref 0.0–0.5)
Eosinophils Relative: 2 %
HCT: 40.5 % (ref 39.0–52.0)
Hemoglobin: 13 g/dL (ref 13.0–17.0)
IMMATURE GRANULOCYTES: 0 %
Lymphocytes Relative: 38 %
Lymphs Abs: 3.3 10*3/uL (ref 0.7–4.0)
MCH: 29.1 pg (ref 26.0–34.0)
MCHC: 32.1 g/dL (ref 30.0–36.0)
MCV: 90.6 fL (ref 80.0–100.0)
Monocytes Absolute: 0.8 10*3/uL (ref 0.1–1.0)
Monocytes Relative: 10 %
NEUTROS PCT: 49 %
Neutro Abs: 4.2 10*3/uL (ref 1.7–7.7)
Platelets: 228 10*3/uL (ref 150–400)
RBC: 4.47 MIL/uL (ref 4.22–5.81)
RDW: 14 % (ref 11.5–15.5)
WBC: 8.6 10*3/uL (ref 4.0–10.5)
nRBC: 0 % (ref 0.0–0.2)

## 2018-06-18 LAB — URINALYSIS, ROUTINE W REFLEX MICROSCOPIC
Bilirubin Urine: NEGATIVE
Glucose, UA: NEGATIVE mg/dL
Hgb urine dipstick: NEGATIVE
Ketones, ur: 20 mg/dL — AB
Leukocytes,Ua: NEGATIVE
Nitrite: NEGATIVE
Protein, ur: NEGATIVE mg/dL
Specific Gravity, Urine: 1.027 (ref 1.005–1.030)
pH: 5 (ref 5.0–8.0)

## 2018-06-18 LAB — LIPASE, BLOOD: Lipase: 20 U/L (ref 11–51)

## 2018-06-18 LAB — LACTIC ACID, PLASMA: Lactic Acid, Venous: 1.5 mmol/L (ref 0.5–1.9)

## 2018-06-18 LAB — VALPROIC ACID LEVEL: Valproic Acid Lvl: 67 ug/mL (ref 50.0–100.0)

## 2018-06-18 LAB — D-DIMER, QUANTITATIVE: D-Dimer, Quant: 0.67 ug/mL-FEU — ABNORMAL HIGH (ref 0.00–0.50)

## 2018-06-18 MED ORDER — SODIUM CHLORIDE (PF) 0.9 % IJ SOLN
INTRAMUSCULAR | Status: AC
  Filled 2018-06-18: qty 50

## 2018-06-18 MED ORDER — IOHEXOL 350 MG/ML SOLN
100.0000 mL | Freq: Once | INTRAVENOUS | Status: AC | PRN
  Administered 2018-06-18: 100 mL via INTRAVENOUS

## 2018-06-18 MED ORDER — SODIUM CHLORIDE 0.9 % IV BOLUS
500.0000 mL | Freq: Once | INTRAVENOUS | Status: AC
  Administered 2018-06-18: 500 mL via INTRAVENOUS

## 2018-06-18 NOTE — Discharge Instructions (Addendum)
Your blood pressure was a little bit low today. The low blood pressure may be contributing to your seizures. Please stop your amlodipine. Drink plenty of fluids. Please follow-up with your family doctor and call your neurologist about what happened today. Get rechecked immediately if you have any new or worrisome symptoms.

## 2018-06-18 NOTE — ED Notes (Signed)
Madilyn Hook, MD made aware of D-Dimer.

## 2018-06-18 NOTE — ED Notes (Addendum)
Spoke to [employer] (Daughter) @ [phone].  Updated Sonya on plan of care for patient and gave call back number. Call Sonya once patient is discharged for patient pick up.

## 2018-06-18 NOTE — ED Notes (Addendum)
Madilyn Hook, MD made aware of low BP. MD at bedside.

## 2018-06-18 NOTE — ED Triage Notes (Addendum)
Sitting at breakfast table with daughter. Slumped over and started to have seizure. Full body. About 5 minutes. Patient vomited once.   Denies pain.  Denies hitting head.   Hx. Seizures in last month. New onset.  Hx. Dementia    CBG-129 BP-121/86 HR-80 94% RA  20 Left Arm  A/Ox4 now   Slow to talk per daughter that is his baseline.   No interventions by ems.    MD at bedside.

## 2018-06-18 NOTE — ED Notes (Signed)
Urinal at bedside. Patient states he is able to use urinal.

## 2018-06-18 NOTE — ED Notes (Signed)
Patient given sandwich, crackers, and soda. Will ambulate once patient is done eating.

## 2018-06-18 NOTE — ED Notes (Signed)
Label machine not working even after restarting.Secretary made aware.

## 2018-06-18 NOTE — ED Notes (Signed)
Assisted patient with urinal. Patient unable to void at this time.  

## 2018-06-18 NOTE — ED Notes (Signed)
Patient has transported to radiology.

## 2018-06-18 NOTE — ED Notes (Signed)
Made CT aware patient has IV and ready for scan.

## 2018-06-18 NOTE — ED Notes (Signed)
Patient given discharge teaching and verbalized understanding. Patient was taken out of ED w/ wheelchair.  

## 2018-06-18 NOTE — ED Notes (Signed)
Tyrone Hook, MD made aware of rectal temp and recent BP.

## 2018-06-18 NOTE — ED Notes (Signed)
Patient ambulated well with stand by assist.  

## 2018-06-18 NOTE — ED Provider Notes (Signed)
[facility]-EMERGENCY DEPT Provider Note   CSN: [id number] Arrival date & time: 06/18/18  0915    History   Chief Complaint No chief complaint on file.   HPI Tyrone Noble is a 68 y.o. male.     The history is provided by the patient, the EMS personnel and medical records. No language interpreter was used.   Tyrone Noble is a 68 y.o. male who presents to the Emergency Department complaining of seizure. Level V caveat due to confusion. History is provided by EMS. Per EMS he has a history of seizure disorder. He was sitting at the breakfast table this morning when he slumped over and developed generalized tonic clonic activity. Seizure activity lasted about three minutes followed by a postictal period. He did have vomiting after the seizure. EMS reports that postictal phase has cleared in transit. Patient reports abdominal pain as well as vomiting. He believes this began prior to his seizure and may have been present for the last two days. He denies any fevers, chest pain, shortness of breath, dysuria, diarrhea, constipation. He currently lives with his daughter. He is compliant with his medications. He denies any sick contacts. He denies any tobacco, alcohol, drug use. He does not recall events earlier today.  Additional history available from patient's son-in-law, [name], after initial ED arrival. Son-in-law states that the patient would not eat yesterday but he did take his medications. He has been more drowsy since yesterday. This morning when he went to breakfast he was halfway through his breakfast when he slumped over. He was minimally responsive during this event and could grasp with his right hand but not with his left hand. He was able to say his son's name but no additional words. He improved and then had another episode. The entire event lasted 30 to 45 minutes. He did have vomiting and vomited up his breakfast. Past Medical History:  Diagnosis Date   . Coronary artery disease   . Dementia (HCC)   . Depression   . Headache    "monthly" (02/16/2015)  . Heart disease   . Hypertension   . Memory loss   . Stroke [facility])    "years ago" (02/16/2015)    Patient Active Problem List   Diagnosis Date Noted  . Parkinsonism (HCC) 02/26/2018  . Therapeutic drug monitoring 09/12/2017  . Seizures (HCC) 05/31/2017  . Dementia without behavioral disturbance (HCC) 05/31/2017  . New-onset angina (HCC) 02/16/2015  . HYPERLIPIDEMIA 12/13/2009  . ALCOHOLISM 07/07/2009  . CALLUS, TOE 01/03/2007  . DEPRESSION, MAJOR, RECURRENT 05/17/2006  . TOBACCO DEPENDENCE 05/17/2006  . HYPERTENSION, BENIGN SYSTEMIC 05/17/2006  . HEMORRHOIDS, NOS 05/17/2006  . GASTROESOPHAGEAL REFLUX, NO ESOPHAGITIS 05/17/2006  . LEG PAIN OR KNEE PAIN 05/17/2006    Past Surgical History:  Procedure Laterality Date  . CARDIAC CATHETERIZATION N/A 02/16/2015   Procedure: Left Heart Cath and Coronary Angiography;  Surgeon: Rinaldo Cloud, MD;  Location: [facility] INVASIVE CV LAB;  Service: Cardiovascular;  Laterality: N/A;  . CARDIAC CATHETERIZATION N/A 02/16/2015   Procedure: Coronary Stent Intervention;  Surgeon: Rinaldo Cloud, MD;  Location: [facility] INVASIVE CV LAB;  Service: Cardiovascular;  Laterality: N/A;  . CARDIAC CATHETERIZATION N/A 02/16/2015   Procedure: Intravascular Pressure Wire/FFR Study;  Surgeon: Rinaldo Cloud, MD;  Location: [facility] INVASIVE CV LAB;  Service: Cardiovascular;  Laterality: N/A;  . CORONARY ANGIOPLASTY    . LEFT HEART CATH AND CORONARY ANGIOGRAPHY N/A 03/29/2017   Procedure: LEFT HEART CATH AND CORONARY ANGIOGRAPHY;  Surgeon:  Rinaldo Cloud, MD;  Location: [facility] INVASIVE CV LAB;  Service: Cardiovascular;  Laterality: N/A;  . LEFT HEART CATHETERIZATION WITH CORONARY ANGIOGRAM N/A 03/19/2012   Procedure: LEFT HEART CATHETERIZATION WITH CORONARY ANGIOGRAM;  Surgeon: Robynn Pane, MD;  Location: [facility] CATH LAB;  Service: Cardiovascular;  Laterality: N/A;  . WISDOM TOOTH  EXTRACTION          Home Medications    Prior to Admission medications   Medication Sig Start Date End Date Taking? Authorizing Provider  aspirin 81 MG chewable tablet Chew 1 tablet (81 mg total) by mouth daily. 02/17/15  Yes Rinaldo Cloud, MD  atorvastatin (LIPITOR) 20 MG tablet Take 20 mg by mouth daily.  05/08/17  Yes [provider]  carbidopa-levodopa (SINEMET IR) 25-100 MG tablet Take 1 tablet by mouth 3 (three) times daily. 04/17/18  Yes Levert Feinstein, MD  divalproex (DEPAKOTE ER) 500 MG 24 hr tablet Take 2 tablets (1,000 mg total) by mouth at bedtime. 06/06/18  Yes Levert Feinstein, MD  levETIRAcetam (KEPPRA) 500 MG tablet Take 1 tablet (500 mg total) by mouth 2 (two) times daily. 06/06/18  Yes Levert Feinstein, MD  lisinopril (PRINIVIL,ZESTRIL) 20 MG tablet Take 1 tablet (20 mg total) by mouth daily. 04/21/18  Yes Fawze, Mina A, PA-C  metoprolol succinate (TOPROL-XL) 25 MG 24 hr tablet Take 25 mg by mouth daily. 11/19/15  Yes [provider]  Multiple Vitamins-Minerals (MULTIVITAMIN WITH MINERALS) tablet Take 1 tablet by mouth daily.   Yes [provider]  risperiDONE (RISPERDAL) 1 MG tablet Take 1 tablet (1 mg total) by mouth at bedtime. Patient taking differently: Take 1 mg by mouth daily.  02/26/18  Yes Levert Feinstein, MD  azithromycin (ZITHROMAX) 250 MG tablet Take 1 tablet (250 mg total) by mouth daily. Take first 2 tablets together, then 1 every day until finished. Patient not taking: Reported on 06/18/2018 06/05/18   Charlynne Pander, MD    Family History Family History  Problem Relation Age of Onset  . Diabetes Mother   . Hypertension Mother   . Stroke Father   . Cancer Sister     Social History Social History   Tobacco Use  . Smoking status: Former Games developer  . Smokeless tobacco: Never Used  . Tobacco comment: quit many years ago  Substance Use Topics  . Alcohol use: Yes    Comment: 02/16/2015 "last alcohol was in ~ 2012"  . Drug use: No     Allergies    Aricept [donepezil hcl] and Penicillins   Review of Systems Review of Systems  All other systems reviewed and are negative.    Physical Exam Updated Vital Signs BP 108/73   Pulse 69   Temp (S) 98 F (36.7 C) (Rectal)   Resp 14   SpO2 100%   Physical Exam   ED Treatments / Results  Labs (all labs ordered are listed, but only abnormal results are displayed) Labs Reviewed  COMPREHENSIVE METABOLIC PANEL - Abnormal; Notable for the following components:      Result Value   Glucose, Bld 134 (*)    Calcium 8.5 (*)    All other components within normal limits  URINALYSIS, ROUTINE W REFLEX MICROSCOPIC - Abnormal; Notable for the following components:   Color, Urine AMBER (*)    APPearance HAZY (*)    Ketones, ur 20 (*)    All other components within normal limits  D-DIMER, QUANTITATIVE (NOT AT [facility]) - Abnormal; Notable for the following components:   D-Dimer,  Quant 0.67 (*)    All other components within normal limits  URINE CULTURE  CBC WITH DIFFERENTIAL/PLATELET  LIPASE, BLOOD  VALPROIC ACID LEVEL  MAGNESIUM  LACTIC ACID, PLASMA    EKG EKG Interpretation  Date/Time:  Tuesday June 18 2018 09:30:06 EDT Ventricular Rate:  77 PR Interval:    QRS Duration: 149 QT Interval:  404 QTC Calculation: 458 R Axis:   -92 Text Interpretation:  Sinus rhythm RBBB and LAFB No significant change since last tracing Confirmed by Tilden Fossa [phone]) on 06/18/2018 9:59:45 AM   Radiology Ct Angio Chest Pe W/cm &/or Wo Cm  Result Date: 06/18/2018 CLINICAL DATA:  Positive D-dimer level.  Seizure. EXAM: CT ANGIOGRAPHY CHEST WITH CONTRAST TECHNIQUE: Multidetector CT imaging of the chest was performed using the standard protocol during bolus administration of intravenous contrast. Multiplanar CT image reconstructions and MIPs were obtained to evaluate the vascular anatomy. CONTRAST:  OMNIPAQUE IOHEXOL 350 MG/ML SOLN COMPARISON:  CT scan of June 05, 2018. FINDINGS:  Cardiovascular: Satisfactory opacification of the pulmonary arteries to the segmental level. No evidence of pulmonary embolism. Normal heart size. No pericardial effusion. Mediastinum/Nodes: No enlarged mediastinal, hilar, or axillary lymph nodes. Thyroid gland, trachea, and esophagus demonstrate no significant findings. Lungs/Pleura: Lungs are clear. No pleural effusion or pneumothorax. Upper Abdomen: No acute abnormality. Musculoskeletal: No chest wall abnormality. No acute or significant osseous findings. Review of the MIP images confirms the above findings. IMPRESSION: No definite evidence of pulmonary embolus. No abnormality seen in the chest. Electronically Signed   By: Lupita Raider, M.D.   On: 06/18/2018 15:19    Procedures Procedures (including critical care time)  Medications Ordered in ED Medications  sodium chloride (PF) 0.9 % injection (has no administration in time range)  sodium chloride 0.9 % bolus 500 mL (0 mLs Intravenous Stopped 06/18/18 1103)  iohexol (OMNIPAQUE) 350 MG/ML injection 100 mL (100 mLs Intravenous Contrast Given 06/18/18 1444)     Initial Impression / Assessment and Plan / ED Course  I have reviewed the triage vital signs and the nursing notes.  Pertinent labs & imaging results that were available during my care of the patient were reviewed by me and considered in my medical decision making (see chart for details).        Patient with history of seizure disorder and Parkinson's here for evaluation of seizure episodes. He has been more drowsy since yesterday. Patient is slow to answer questions in the emergency department but well appearing and non-toxic with no focal deficits. On initial assessment he did complain of abdominal pain and had mild abdominal tenderness. On repeat assessment his pain is resolved and he is able to eat and drink without difficulty. He can walk the department without difficulty. He did have some transient hypotension in the emergency  department. Question if this hypotension contributed to his drowsiness yesterday as well as the seizure episodes today. There is no current evidence of sepsis or poor perfusion due to his blood pressures. The hypotension resolved after small IV fluid bolus. D dimer was mildly elevated and CTA was obtained, which was negative for PE. Plan to discontinue his amlodipine. Plan to discharge home with close outpatient follow-up as well as return precautions. Discussed with Lamar Laundry, patient's daughter, findings of studies. Discussed importance of outpatient follow-up and return precautions.  Final Clinical Impressions(s) / ED Diagnoses   Final diagnoses:  Hypotensive episode  Seizure [facility])    ED Discharge Orders    None  Tilden Fossa, MD 06/18/18 [phone]

## 2018-06-18 NOTE — ED Notes (Signed)
Bed: WA23 Expected date:  Expected time:  Means of arrival:  Comments: EMS-SZ 

## 2018-06-19 LAB — URINE CULTURE: Culture: <10000 — AB

## 2018-07-19 DIAGNOSIS — R630 Anorexia: Secondary | ICD-10-CM | POA: Diagnosis not present

## 2018-07-19 DIAGNOSIS — Z8673 Personal history of transient ischemic attack (TIA), and cerebral infarction without residual deficits: Secondary | ICD-10-CM | POA: Diagnosis not present

## 2018-07-19 DIAGNOSIS — Z955 Presence of coronary angioplasty implant and graft: Secondary | ICD-10-CM | POA: Diagnosis not present

## 2018-07-19 DIAGNOSIS — R569 Unspecified convulsions: Secondary | ICD-10-CM | POA: Diagnosis not present

## 2018-07-19 DIAGNOSIS — F0390 Unspecified dementia without behavioral disturbance: Secondary | ICD-10-CM | POA: Diagnosis not present

## 2018-07-19 DIAGNOSIS — Z7982 Long term (current) use of aspirin: Secondary | ICD-10-CM | POA: Diagnosis not present

## 2018-07-19 DIAGNOSIS — F329 Major depressive disorder, single episode, unspecified: Secondary | ICD-10-CM | POA: Diagnosis not present

## 2018-07-19 DIAGNOSIS — I2510 Atherosclerotic heart disease of native coronary artery without angina pectoris: Secondary | ICD-10-CM | POA: Diagnosis not present

## 2018-07-19 DIAGNOSIS — I10 Essential (primary) hypertension: Secondary | ICD-10-CM | POA: Diagnosis not present

## 2018-07-24 DIAGNOSIS — I25118 Atherosclerotic heart disease of native coronary artery with other forms of angina pectoris: Secondary | ICD-10-CM | POA: Diagnosis not present

## 2018-07-24 DIAGNOSIS — E785 Hyperlipidemia, unspecified: Secondary | ICD-10-CM | POA: Diagnosis not present

## 2018-07-24 DIAGNOSIS — G40909 Epilepsy, unspecified, not intractable, without status epilepticus: Secondary | ICD-10-CM | POA: Diagnosis not present

## 2018-07-24 DIAGNOSIS — I10 Essential (primary) hypertension: Secondary | ICD-10-CM | POA: Diagnosis not present

## 2018-07-24 DIAGNOSIS — F17290 Nicotine dependence, other tobacco product, uncomplicated: Secondary | ICD-10-CM | POA: Diagnosis not present

## 2018-07-24 DIAGNOSIS — G20 Parkinson's disease: Secondary | ICD-10-CM | POA: Diagnosis not present

## 2018-07-25 DIAGNOSIS — F0390 Unspecified dementia without behavioral disturbance: Secondary | ICD-10-CM | POA: Diagnosis not present

## 2018-07-25 DIAGNOSIS — Z7982 Long term (current) use of aspirin: Secondary | ICD-10-CM | POA: Diagnosis not present

## 2018-07-25 DIAGNOSIS — I2510 Atherosclerotic heart disease of native coronary artery without angina pectoris: Secondary | ICD-10-CM | POA: Diagnosis not present

## 2018-07-25 DIAGNOSIS — R569 Unspecified convulsions: Secondary | ICD-10-CM | POA: Diagnosis not present

## 2018-07-25 DIAGNOSIS — Z955 Presence of coronary angioplasty implant and graft: Secondary | ICD-10-CM | POA: Diagnosis not present

## 2018-07-25 DIAGNOSIS — F329 Major depressive disorder, single episode, unspecified: Secondary | ICD-10-CM | POA: Diagnosis not present

## 2018-07-25 DIAGNOSIS — R630 Anorexia: Secondary | ICD-10-CM | POA: Diagnosis not present

## 2018-07-25 DIAGNOSIS — I10 Essential (primary) hypertension: Secondary | ICD-10-CM | POA: Diagnosis not present

## 2018-07-25 DIAGNOSIS — Z8673 Personal history of transient ischemic attack (TIA), and cerebral infarction without residual deficits: Secondary | ICD-10-CM | POA: Diagnosis not present

## 2018-07-26 DIAGNOSIS — F0390 Unspecified dementia without behavioral disturbance: Secondary | ICD-10-CM | POA: Diagnosis not present

## 2018-07-26 DIAGNOSIS — Z7982 Long term (current) use of aspirin: Secondary | ICD-10-CM | POA: Diagnosis not present

## 2018-07-26 DIAGNOSIS — Z955 Presence of coronary angioplasty implant and graft: Secondary | ICD-10-CM | POA: Diagnosis not present

## 2018-07-26 DIAGNOSIS — F329 Major depressive disorder, single episode, unspecified: Secondary | ICD-10-CM | POA: Diagnosis not present

## 2018-07-26 DIAGNOSIS — R630 Anorexia: Secondary | ICD-10-CM | POA: Diagnosis not present

## 2018-07-26 DIAGNOSIS — I10 Essential (primary) hypertension: Secondary | ICD-10-CM | POA: Diagnosis not present

## 2018-07-26 DIAGNOSIS — Z8673 Personal history of transient ischemic attack (TIA), and cerebral infarction without residual deficits: Secondary | ICD-10-CM | POA: Diagnosis not present

## 2018-07-26 DIAGNOSIS — I2510 Atherosclerotic heart disease of native coronary artery without angina pectoris: Secondary | ICD-10-CM | POA: Diagnosis not present

## 2018-07-26 DIAGNOSIS — R569 Unspecified convulsions: Secondary | ICD-10-CM | POA: Diagnosis not present

## 2018-07-30 DIAGNOSIS — R630 Anorexia: Secondary | ICD-10-CM | POA: Diagnosis not present

## 2018-07-30 DIAGNOSIS — R569 Unspecified convulsions: Secondary | ICD-10-CM | POA: Diagnosis not present

## 2018-07-30 DIAGNOSIS — Z8673 Personal history of transient ischemic attack (TIA), and cerebral infarction without residual deficits: Secondary | ICD-10-CM | POA: Diagnosis not present

## 2018-07-30 DIAGNOSIS — I10 Essential (primary) hypertension: Secondary | ICD-10-CM | POA: Diagnosis not present

## 2018-07-30 DIAGNOSIS — Z7982 Long term (current) use of aspirin: Secondary | ICD-10-CM | POA: Diagnosis not present

## 2018-07-30 DIAGNOSIS — F0390 Unspecified dementia without behavioral disturbance: Secondary | ICD-10-CM | POA: Diagnosis not present

## 2018-07-30 DIAGNOSIS — I2510 Atherosclerotic heart disease of native coronary artery without angina pectoris: Secondary | ICD-10-CM | POA: Diagnosis not present

## 2018-07-30 DIAGNOSIS — F329 Major depressive disorder, single episode, unspecified: Secondary | ICD-10-CM | POA: Diagnosis not present

## 2018-07-30 DIAGNOSIS — Z955 Presence of coronary angioplasty implant and graft: Secondary | ICD-10-CM | POA: Diagnosis not present

## 2018-08-01 DIAGNOSIS — Z7982 Long term (current) use of aspirin: Secondary | ICD-10-CM | POA: Diagnosis not present

## 2018-08-01 DIAGNOSIS — I10 Essential (primary) hypertension: Secondary | ICD-10-CM | POA: Diagnosis not present

## 2018-08-01 DIAGNOSIS — R630 Anorexia: Secondary | ICD-10-CM | POA: Diagnosis not present

## 2018-08-01 DIAGNOSIS — Z955 Presence of coronary angioplasty implant and graft: Secondary | ICD-10-CM | POA: Diagnosis not present

## 2018-08-01 DIAGNOSIS — F0390 Unspecified dementia without behavioral disturbance: Secondary | ICD-10-CM | POA: Diagnosis not present

## 2018-08-01 DIAGNOSIS — I2510 Atherosclerotic heart disease of native coronary artery without angina pectoris: Secondary | ICD-10-CM | POA: Diagnosis not present

## 2018-08-01 DIAGNOSIS — R569 Unspecified convulsions: Secondary | ICD-10-CM | POA: Diagnosis not present

## 2018-08-01 DIAGNOSIS — F329 Major depressive disorder, single episode, unspecified: Secondary | ICD-10-CM | POA: Diagnosis not present

## 2018-08-01 DIAGNOSIS — Z8673 Personal history of transient ischemic attack (TIA), and cerebral infarction without residual deficits: Secondary | ICD-10-CM | POA: Diagnosis not present

## 2018-08-06 DIAGNOSIS — F0390 Unspecified dementia without behavioral disturbance: Secondary | ICD-10-CM | POA: Diagnosis not present

## 2018-08-06 DIAGNOSIS — R630 Anorexia: Secondary | ICD-10-CM | POA: Diagnosis not present

## 2018-08-06 DIAGNOSIS — Z7982 Long term (current) use of aspirin: Secondary | ICD-10-CM | POA: Diagnosis not present

## 2018-08-06 DIAGNOSIS — Z955 Presence of coronary angioplasty implant and graft: Secondary | ICD-10-CM | POA: Diagnosis not present

## 2018-08-06 DIAGNOSIS — R569 Unspecified convulsions: Secondary | ICD-10-CM | POA: Diagnosis not present

## 2018-08-06 DIAGNOSIS — Z8673 Personal history of transient ischemic attack (TIA), and cerebral infarction without residual deficits: Secondary | ICD-10-CM | POA: Diagnosis not present

## 2018-08-06 DIAGNOSIS — I10 Essential (primary) hypertension: Secondary | ICD-10-CM | POA: Diagnosis not present

## 2018-08-06 DIAGNOSIS — F329 Major depressive disorder, single episode, unspecified: Secondary | ICD-10-CM | POA: Diagnosis not present

## 2018-08-06 DIAGNOSIS — I2510 Atherosclerotic heart disease of native coronary artery without angina pectoris: Secondary | ICD-10-CM | POA: Diagnosis not present

## 2018-08-14 DIAGNOSIS — R569 Unspecified convulsions: Secondary | ICD-10-CM | POA: Diagnosis not present

## 2018-08-14 DIAGNOSIS — I10 Essential (primary) hypertension: Secondary | ICD-10-CM | POA: Diagnosis not present

## 2018-08-14 DIAGNOSIS — Z7982 Long term (current) use of aspirin: Secondary | ICD-10-CM | POA: Diagnosis not present

## 2018-08-14 DIAGNOSIS — Z8673 Personal history of transient ischemic attack (TIA), and cerebral infarction without residual deficits: Secondary | ICD-10-CM | POA: Diagnosis not present

## 2018-08-14 DIAGNOSIS — F0390 Unspecified dementia without behavioral disturbance: Secondary | ICD-10-CM | POA: Diagnosis not present

## 2018-08-14 DIAGNOSIS — F329 Major depressive disorder, single episode, unspecified: Secondary | ICD-10-CM | POA: Diagnosis not present

## 2018-08-14 DIAGNOSIS — I2510 Atherosclerotic heart disease of native coronary artery without angina pectoris: Secondary | ICD-10-CM | POA: Diagnosis not present

## 2018-08-14 DIAGNOSIS — Z955 Presence of coronary angioplasty implant and graft: Secondary | ICD-10-CM | POA: Diagnosis not present

## 2018-08-14 DIAGNOSIS — R630 Anorexia: Secondary | ICD-10-CM | POA: Diagnosis not present

## 2018-08-26 ENCOUNTER — Telehealth: Payer: Self-pay | Admitting: *Deleted

## 2018-08-26 NOTE — Telephone Encounter (Signed)
Due to current COVID 19 pandemic, our office is severely reducing in office visits until further notice, in order to minimize the risk to our patients and healthcare providers. Unable to get in contact with the patient to convert their office visit with Sarah on 08/28/2018 into a doxy.me visit. I left a voicemail asking the patient to return my call. Office number was provided.     If patient calls back please convert their office visit into a doxy.me visit.       

## 2018-08-27 DIAGNOSIS — F0390 Unspecified dementia without behavioral disturbance: Secondary | ICD-10-CM | POA: Diagnosis not present

## 2018-08-27 DIAGNOSIS — I10 Essential (primary) hypertension: Secondary | ICD-10-CM | POA: Diagnosis not present

## 2018-08-27 DIAGNOSIS — R569 Unspecified convulsions: Secondary | ICD-10-CM | POA: Diagnosis not present

## 2018-08-27 DIAGNOSIS — Z955 Presence of coronary angioplasty implant and graft: Secondary | ICD-10-CM | POA: Diagnosis not present

## 2018-08-27 DIAGNOSIS — F329 Major depressive disorder, single episode, unspecified: Secondary | ICD-10-CM | POA: Diagnosis not present

## 2018-08-27 DIAGNOSIS — R630 Anorexia: Secondary | ICD-10-CM | POA: Diagnosis not present

## 2018-08-27 DIAGNOSIS — I2510 Atherosclerotic heart disease of native coronary artery without angina pectoris: Secondary | ICD-10-CM | POA: Diagnosis not present

## 2018-08-27 DIAGNOSIS — Z8673 Personal history of transient ischemic attack (TIA), and cerebral infarction without residual deficits: Secondary | ICD-10-CM | POA: Diagnosis not present

## 2018-08-27 DIAGNOSIS — Z7982 Long term (current) use of aspirin: Secondary | ICD-10-CM | POA: Diagnosis not present

## 2018-08-27 NOTE — Telephone Encounter (Signed)
Called spoke to [place], son in Sports coach.  Pt has funeral tomorrow for his brother.  Rescheduled to 09-11-18 at 1315.  Daughter of pt to be with him at appt.  Pt understands that although there may be some limitations with this type of visit, we will take all precautions to reduce any security or privacy concerns.  Pt understands that this will be treated like an in office visit and we will file with pt's insurance, and there may be a patient responsible charge related to this service.  Consented to doxy.me visit (son in law).  E,mail sent to [email or web address].

## 2018-08-28 ENCOUNTER — Ambulatory Visit: Payer: Medicare HMO | Admitting: Neurology

## 2018-09-06 ENCOUNTER — Emergency Department (HOSPITAL_COMMUNITY): Payer: Medicare HMO

## 2018-09-06 ENCOUNTER — Emergency Department (HOSPITAL_COMMUNITY)
Admission: EM | Admit: 2018-09-06 | Discharge: 2018-09-07 | Disposition: A | Discharge status: Home / Self Care | Payer: Medicare HMO | Attending: Emergency Medicine | Admitting: Emergency Medicine

## 2018-09-06 ENCOUNTER — Encounter (HOSPITAL_COMMUNITY): Payer: Self-pay

## 2018-09-06 ENCOUNTER — Other Ambulatory Visit: Payer: Self-pay

## 2018-09-06 DIAGNOSIS — I10 Essential (primary) hypertension: Secondary | ICD-10-CM | POA: Insufficient documentation

## 2018-09-06 DIAGNOSIS — F0280 Dementia in other diseases classified elsewhere without behavioral disturbance: Secondary | ICD-10-CM | POA: Diagnosis not present

## 2018-09-06 DIAGNOSIS — G3183 Dementia with Lewy bodies: Secondary | ICD-10-CM | POA: Insufficient documentation

## 2018-09-06 DIAGNOSIS — Z8673 Personal history of transient ischemic attack (TIA), and cerebral infarction without residual deficits: Secondary | ICD-10-CM | POA: Insufficient documentation

## 2018-09-06 DIAGNOSIS — Z7982 Long term (current) use of aspirin: Secondary | ICD-10-CM | POA: Insufficient documentation

## 2018-09-06 DIAGNOSIS — G40909 Epilepsy, unspecified, not intractable, without status epilepticus: Secondary | ICD-10-CM | POA: Diagnosis not present

## 2018-09-06 DIAGNOSIS — Z79899 Other long term (current) drug therapy: Secondary | ICD-10-CM | POA: Insufficient documentation

## 2018-09-06 DIAGNOSIS — I2510 Atherosclerotic heart disease of native coronary artery without angina pectoris: Secondary | ICD-10-CM | POA: Diagnosis not present

## 2018-09-06 DIAGNOSIS — Z87891 Personal history of nicotine dependence: Secondary | ICD-10-CM | POA: Diagnosis not present

## 2018-09-06 DIAGNOSIS — R569 Unspecified convulsions: Secondary | ICD-10-CM | POA: Diagnosis not present

## 2018-09-06 DIAGNOSIS — R404 Transient alteration of awareness: Secondary | ICD-10-CM | POA: Diagnosis not present

## 2018-09-06 LAB — URINALYSIS, ROUTINE W REFLEX MICROSCOPIC
Bilirubin Urine: NEGATIVE
Glucose, UA: NEGATIVE mg/dL
Hgb urine dipstick: NEGATIVE
Ketones, ur: 5 mg/dL — AB
Leukocytes,Ua: NEGATIVE
Nitrite: NEGATIVE
Protein, ur: NEGATIVE mg/dL
Specific Gravity, Urine: 1.014 (ref 1.005–1.030)
pH: 7 (ref 5.0–8.0)

## 2018-09-06 LAB — CBC WITH DIFFERENTIAL/PLATELET
Abs Immature Granulocytes: 0.02 10*3/uL (ref 0.00–0.07)
Basophils Absolute: 0 10*3/uL (ref 0.0–0.1)
Basophils Relative: 1 %
Eosinophils Absolute: 0.2 10*3/uL (ref 0.0–0.5)
Eosinophils Relative: 3 %
HCT: 44.1 % (ref 39.0–52.0)
Hemoglobin: 14 g/dL (ref 13.0–17.0)
Immature Granulocytes: 0 %
Lymphocytes Relative: 35 %
Lymphs Abs: 2.2 10*3/uL (ref 0.7–4.0)
MCH: 28.8 pg (ref 26.0–34.0)
MCHC: 31.7 g/dL (ref 30.0–36.0)
MCV: 90.7 fL (ref 80.0–100.0)
Monocytes Absolute: 0.6 10*3/uL (ref 0.1–1.0)
Monocytes Relative: 10 %
Neutro Abs: 3.3 10*3/uL (ref 1.7–7.7)
Neutrophils Relative %: 51 %
Platelets: 186 10*3/uL (ref 150–400)
RBC: 4.86 MIL/uL (ref 4.22–5.81)
RDW: 14.6 % (ref 11.5–15.5)
WBC: 6.3 10*3/uL (ref 4.0–10.5)
nRBC: 0 % (ref 0.0–0.2)

## 2018-09-06 LAB — BASIC METABOLIC PANEL
Anion gap: 9 (ref 5–15)
BUN: 12 mg/dL (ref 8–23)
CO2: 27 mmol/L (ref 22–32)
Calcium: 9 mg/dL (ref 8.9–10.3)
Chloride: 104 mmol/L (ref 98–111)
Creatinine, Ser: 1.33 mg/dL — ABNORMAL HIGH (ref 0.61–1.24)
GFR calc Af Amer: >60 mL/min (ref >60)
GFR calc non Af Amer: 54 mL/min — ABNORMAL LOW (ref >60)
Glucose, Bld: 134 mg/dL — ABNORMAL HIGH (ref 70–99)
Potassium: 4.8 mmol/L (ref 3.5–5.1)
Sodium: 140 mmol/L (ref 135–145)

## 2018-09-06 LAB — VALPROIC ACID LEVEL: Valproic Acid Lvl: 56 ug/mL (ref 50.0–100.0)

## 2018-09-06 NOTE — ED Notes (Signed)
Pt calling out for his glasses, pt did not come to ED with glasses. Pt also asked CT for his glasses, but again, did not have glasses with him. Pt reminded of this, but still insisting he has glasses with him.

## 2018-09-06 NOTE — ED Notes (Signed)
Patient transported to CT 

## 2018-09-06 NOTE — ED Triage Notes (Signed)
Pt BIB EMS from home. Pt has hx of seizures and severe dementia. Pt family reports last seizure was 3 months until tonight when pt had several within 30 min and each lasted about 30 sec, pt had no falls, he was lying down during seizures. Family reports pt did not have LOC. Family reports pt takes meds. Family members are care takers for pt.   Family to contact [facility] [phone]  18 L AC  CBG 142

## 2018-09-06 NOTE — Discharge Instructions (Addendum)
You were seen in the emergency department for multiple seizure episodes tonight.  You had blood work urinalysis chest x-ray and a CAT scan of your head that did not show any obvious abnormalities.  Your valproic acid level was in the normal range.  Please contact your neurologist for close follow-up.  Return if any concerning symptoms.

## 2018-09-06 NOTE — ED Provider Notes (Signed)
[facility]-EMERGENCY DEPT Provider Note   CSN: [id number] Arrival date & time: 09/06/18  2056     History   Chief Complaint Chief Complaint  Patient presents with   Seizures    HPI Tyrone Noble is a 68 y.o. male.  Level 5 caveat secondary to dementia.  Patient is brought in by EMS from home after a possible seizure tonight.  Says he may have had multiple swallow once each lasting about 30 seconds.  There was no fall or injury.  Patient reportedly by EMS last seizures were 3 months ago.  Patient states he is not on seizure medicines but has not been on for a while but family said he is taking them.  Currently he denies any complaints.     The history is provided by the patient, the EMS personnel and a relative.  Seizures Seizure activity on arrival: no     Past Medical History:  Diagnosis Date   Coronary artery disease    Dementia (HCC)    Depression    Headache    "monthly" (02/16/2015)   Heart disease    Hypertension    Memory loss    Stroke [facility](HCC)    "years ago" (02/16/2015)    Patient Active Problem List   Diagnosis Date Noted   Parkinsonism (HCC) 02/26/2018   Therapeutic drug monitoring 09/12/2017   Seizures (HCC) 05/31/2017   Dementia without behavioral disturbance (HCC) 05/31/2017   New-onset angina (HCC) 02/16/2015   HYPERLIPIDEMIA 12/13/2009   ALCOHOLISM 07/07/2009   CALLUS, TOE 01/03/2007   DEPRESSION, MAJOR, RECURRENT 05/17/2006   TOBACCO DEPENDENCE 05/17/2006   HYPERTENSION, BENIGN SYSTEMIC 05/17/2006   HEMORRHOIDS, NOS 05/17/2006   GASTROESOPHAGEAL REFLUX, NO ESOPHAGITIS 05/17/2006   LEG PAIN OR KNEE PAIN 05/17/2006    Past Surgical History:  Procedure Laterality Date   CARDIAC CATHETERIZATION N/A 02/16/2015   Procedure: Left Heart Cath and Coronary Angiography;  Surgeon: Rinaldo CloudMohan Harwani, MD;  [facility];  Service: Cardiovascular;  Laterality: N/A;   CARDIAC CATHETERIZATION N/A  02/16/2015   Procedure: Coronary Stent Intervention;  Surgeon: Rinaldo CloudMohan Harwani, MD;  Location: [facility] INVASIVE CV LAB;  Service: Cardiovascular;  Laterality: N/A;   CARDIAC CATHETERIZATION N/A 02/16/2015   Procedure: Intravascular Pressure Wire/FFR Study;  Surgeon: Rinaldo CloudMohan Harwani, MD;  [facility];  Service: Cardiovascular;  Laterality: N/A;   CORONARY ANGIOPLASTY     LEFT HEART CATH AND CORONARY ANGIOGRAPHY N/A 03/29/2017   Procedure: LEFT HEART CATH AND CORONARY ANGIOGRAPHY;  Surgeon: Rinaldo CloudHarwani, Mohan, MD;  Location: [facility] INVASIVE CV LAB;  Service: Cardiovascular;  Laterality: N/A;   LEFT HEART CATHETERIZATION WITH CORONARY ANGIOGRAM N/A 03/19/2012   Procedure: LEFT HEART CATHETERIZATION WITH CORONARY ANGIOGRAM;  Surgeon: Robynn PaneMohan N Harwani, MD;  Location: [facility] CATH LAB;  Service: Cardiovascular;  Laterality: N/A;   WISDOM TOOTH EXTRACTION          Home Medications    Prior to Admission medications   Medication Sig Start Date End Date Taking? Authorizing Provider  aspirin 81 MG chewable tablet Chew 1 tablet (81 mg total) by mouth daily. 02/17/15   Rinaldo CloudHarwani, Mohan, MD  atorvastatin (LIPITOR) 20 MG tablet Take 20 mg by mouth daily.  05/08/17   [provider]  azithromycin (ZITHROMAX) 250 MG tablet Take 1 tablet (250 mg total) by mouth daily. Take first 2 tablets together, then 1 every day until finished. Patient not taking: Reported on 06/18/2018 06/05/18   Charlynne PanderYao, David Hsienta, MD  carbidopa-levodopa [facility](SINEMET  IR) 25-100 MG tablet Take 1 tablet by mouth 3 (three) times daily. 04/17/18   Levert FeinsteinYan, Yijun, MD  divalproex (DEPAKOTE ER) 500 MG 24 hr tablet Take 2 tablets (1,000 mg total) by mouth at bedtime. 06/06/18   Levert FeinsteinYan, Yijun, MD  levETIRAcetam (KEPPRA) 500 MG tablet Take 1 tablet (500 mg total) by mouth 2 (two) times daily. 06/06/18   Levert FeinsteinYan, Yijun, MD  lisinopril (PRINIVIL,ZESTRIL) 20 MG tablet Take 1 tablet (20 mg total) by mouth daily. 04/21/18   Luevenia MaxinFawze, Mina A, PA-C  metoprolol succinate  (TOPROL-XL) 25 MG 24 hr tablet Take 25 mg by mouth daily. 11/19/15   [provider]  Multiple Vitamins-Minerals (MULTIVITAMIN WITH MINERALS) tablet Take 1 tablet by mouth daily.    [provider]  risperiDONE (RISPERDAL) 1 MG tablet Take 1 tablet (1 mg total) by mouth at bedtime. Patient taking differently: Take 1 mg by mouth daily.  02/26/18   Levert FeinsteinYan, Yijun, MD    Family History Family History  Problem Relation Age of Onset   Diabetes Mother    Hypertension Mother    Stroke Father    Cancer Sister     Social History Social History   Tobacco Use   Smoking status: Former Smoker   Smokeless tobacco: Never Used   Tobacco comment: quit many years ago  Substance Use Topics   Alcohol use: Yes    Comment: 02/16/2015 "last alcohol was in ~ 2012"   Drug use: No     Allergies   Aricept [donepezil hcl] and Penicillins   Review of Systems Review of Systems  Unable to perform ROS: Dementia  Neurological: Positive for seizures.     Physical Exam Updated Vital Signs BP 130/79 (BP Location: Right Arm)    Pulse 67    Temp 98.6 F (37 C) (Oral)    Resp 13    SpO2 100%   Physical Exam Vitals signs and nursing note reviewed.  Constitutional:      Appearance: He is well-developed.  HENT:     Head: Normocephalic and atraumatic.  Eyes:     Conjunctiva/sclera: Conjunctivae normal.  Neck:     Musculoskeletal: Neck supple.  Cardiovascular:     Rate and Rhythm: Normal rate and regular rhythm.     Heart sounds: No murmur.  Pulmonary:     Effort: Pulmonary effort is normal. No respiratory distress.     Breath sounds: Normal breath sounds.  Abdominal:     Palpations: Abdomen is soft.     Tenderness: There is no abdominal tenderness.  Musculoskeletal: Normal range of motion.        General: No tenderness.     Right lower leg: No edema.     Left lower leg: No edema.  Skin:    General: Skin is warm and dry.  Neurological:     Mental Status: He is alert.  Mental status is at baseline.     Sensory: No sensory deficit.     Motor: No weakness.      ED Treatments / Results  Labs (all labs ordered are listed, but only abnormal results are displayed) Labs Reviewed  BASIC METABOLIC PANEL - Abnormal; Notable for the following components:      Result Value   Glucose, Bld 134 (*)    Creatinine, Ser 1.33 (*)    GFR calc non Af Amer 54 (*)    All other components within normal limits  URINALYSIS, ROUTINE W REFLEX MICROSCOPIC - Abnormal; Notable for the following components:  Ketones, ur 5 (*)    All other components within normal limits  CBC WITH DIFFERENTIAL/PLATELET  VALPROIC ACID LEVEL    EKG None  Radiology Ct Head Wo Contrast  Result Date: 09/06/2018 CLINICAL DATA:  Seizure activity. EXAM: CT HEAD WITHOUT CONTRAST TECHNIQUE: Contiguous axial images were obtained from the base of the skull through the vertex without intravenous contrast. COMPARISON:  06/05/2018 FINDINGS: Brain: No evidence of acute infarction, hemorrhage, hydrocephalus, extra-axial collection or mass lesion/mass effect. Moderate brain parenchymal volume loss and mild deep white matter microangiopathy. Vascular: Mild calcific atherosclerotic disease. Skull: Normal. Negative for fracture or focal lesion. Sinuses/Orbits: No acute finding. Other: None. IMPRESSION: 1. No acute intracranial abnormality. 2. Atrophy, chronic microvascular disease. Electronically Signed   By: Fidela Salisbury M.D.   On: 09/06/2018 22:32   Dg Chest Port 1 View  Result Date: 09/06/2018 CLINICAL DATA:  History of seizures and dementia.  Seizures today. EXAM: PORTABLE CHEST 1 VIEW COMPARISON:  CT chest 06/18/2018.  PA and lateral chest 06/05/2018. FINDINGS: Lungs clear. Heart size normal. Stent in the left anterior descending coronary artery noted. No pneumothorax or pleural fluid. No acute or focal bony abnormality. IMPRESSION: No acute disease. Electronically Signed   By: Inge Rise M.D.    On: 09/06/2018 21:54    Procedures Procedures (including critical care time)  Medications Ordered in ED Medications - No data to display   Initial Impression / Assessment and Plan / ED Course  I have reviewed the triage vital signs and the nursing notes.  Pertinent labs & imaging results that were available during my care of the patient were reviewed by me and considered in my medical decision making (see chart for details).  Clinical Course as of Sep 06 916  Fri Sep 06, 2018  2136 Was able to reach out to the patient's daughter who was there.  It sounds like earlier today he was complaining of some headache and then tonight while sitting his eyes rolled up in his head and he had about 1/32 episode of some whole body shaking.  That stopped but then recurred again 2 more times.  He has been taking his seizure medications regularly.  She denies any cold symptoms or fever.  He also said he follows with [facility] neurology.   [MB]  2147 Differential diagnosis includes subtherapeutic seizure medications, head bleed, infection lowering seizure threshold, metabolic derangement.   [MB]  2257 Patient's lab work chest x-ray urinalysis and head CT are all unremarkable.  His Depakote is therapeutic.  He has remained seizure-free since he has been here.  Anticipate discharge with follow-up with his neurologist at [facility].   [MB]    Clinical Course User Index [MB] Hayden Rasmussen, MD         Final Clinical Impressions(s) / ED Diagnoses   Final diagnoses:  Seizure [facility])    ED Discharge Orders    None       Hayden Rasmussen, MD 09/07/18 [phone]

## 2018-09-06 NOTE — ED Notes (Signed)
Called pt's SIL Mr [name] to let him know pt is ready for discharge, Mr Angela Adam he will be here shortly.

## 2018-09-10 DIAGNOSIS — R630 Anorexia: Secondary | ICD-10-CM | POA: Diagnosis not present

## 2018-09-10 DIAGNOSIS — Z955 Presence of coronary angioplasty implant and graft: Secondary | ICD-10-CM | POA: Diagnosis not present

## 2018-09-10 DIAGNOSIS — Z8673 Personal history of transient ischemic attack (TIA), and cerebral infarction without residual deficits: Secondary | ICD-10-CM | POA: Diagnosis not present

## 2018-09-10 DIAGNOSIS — F329 Major depressive disorder, single episode, unspecified: Secondary | ICD-10-CM | POA: Diagnosis not present

## 2018-09-10 DIAGNOSIS — I2510 Atherosclerotic heart disease of native coronary artery without angina pectoris: Secondary | ICD-10-CM | POA: Diagnosis not present

## 2018-09-10 DIAGNOSIS — R569 Unspecified convulsions: Secondary | ICD-10-CM | POA: Diagnosis not present

## 2018-09-10 DIAGNOSIS — I10 Essential (primary) hypertension: Secondary | ICD-10-CM | POA: Diagnosis not present

## 2018-09-10 DIAGNOSIS — Z7982 Long term (current) use of aspirin: Secondary | ICD-10-CM | POA: Diagnosis not present

## 2018-09-10 DIAGNOSIS — F0390 Unspecified dementia without behavioral disturbance: Secondary | ICD-10-CM | POA: Diagnosis not present

## 2018-09-11 ENCOUNTER — Ambulatory Visit: Payer: Self-pay | Admitting: Neurology

## 2018-09-11 ENCOUNTER — Telehealth: Payer: Self-pay | Admitting: Neurology

## 2018-09-11 NOTE — Telephone Encounter (Signed)
Spoke to patient's daughter on [place]).  His appt has been rescheduled with Dr. Krista Blue on 09/12/2018 at 1pm.  He does not have an active mychart acct.  New [email or web address] link sent to her confirmed email address.

## 2018-09-11 NOTE — Telephone Encounter (Signed)
Can we get this patient rescheduled to see Dr. Krista Blue tomorrow for VV. He has history of seizures, parkinsonian features, dementia. He was last seen here in December 2019, had phone visit with Dr. Krista Blue in March 2020. Since December has been to the ER 4 times. Most recent 09/06/18 for seizure.

## 2018-09-12 ENCOUNTER — Encounter: Payer: Self-pay | Admitting: Neurology

## 2018-09-12 ENCOUNTER — Ambulatory Visit (INDEPENDENT_AMBULATORY_CARE_PROVIDER_SITE_OTHER): Payer: Medicare HMO | Admitting: Neurology

## 2018-09-12 ENCOUNTER — Other Ambulatory Visit: Payer: Self-pay

## 2018-09-12 DIAGNOSIS — G20C Parkinsonism, unspecified: Secondary | ICD-10-CM

## 2018-09-12 DIAGNOSIS — F0390 Unspecified dementia without behavioral disturbance: Secondary | ICD-10-CM | POA: Diagnosis not present

## 2018-09-12 DIAGNOSIS — G20 Parkinson's disease: Secondary | ICD-10-CM

## 2018-09-12 DIAGNOSIS — R569 Unspecified convulsions: Secondary | ICD-10-CM | POA: Diagnosis not present

## 2018-09-12 MED ORDER — CARBIDOPA-LEVODOPA 25-100 MG PO TABS: 1.0000 | ORAL_TABLET | Freq: Three times a day (TID) | ORAL | 4 refills | Status: DC

## 2018-09-12 MED ORDER — LEVETIRACETAM 500 MG PO TABS: ORAL_TABLET | ORAL | 4 refills | Status: DC

## 2018-09-12 NOTE — Progress Notes (Signed)
[facility]  PATIENT: Tyrone Noble DOB: Dec 01, 1950  HISTORY OF PRESENT ILLNESS: Tyrone Noble is a 68 year old male, seen in refer by his primary care doctor [name], [name] for evaluation of seizure, he is accompanied by his son in law Alcario Droughtkel Noble at today's clinical visit, he has lived with his daughter's family for 5 years.  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 Marjory LiesPenumalli in 2017 for dementia, he did also have a history of alcohol abuse, in remission for many years.  He presented to the emergency room on May 26, 2017 for seizure, he was ready to have breakfast, without warning signs, he began to stare into the space, not responding, whole body shaking, foaming, out of his mouth, postevent confusion last for a few minutes, paramedic was called, he was taken to the emergency room, MRI of the brain showed no acute abnormality, generalized atrophy, moderate supratentorium small vessel disease. Laboratory evaluation seen March 2019 showed normal CBC, BMP showed elevated creatinine 1.37, GFR of 51, negative troponin, UA was normal Is now back to baseline, family denies significant agitations, Mini-Mental Status Examination only 18 out of 30 today  UPDATE 6/26/2019CM [name], 68 year old male returns for follow-up with history of seizure disorder and dementia.  Last seizure occurred in March he is currently on Depakote 500 at bedtime.  Since seen in March by Dr. Terrace ArabiaYan, his Aricept was stopped due to nausea and vomiting as well as Namenda.  Memory score today 21 out of 30.  Last 18 out of 30.  EEG done 07/02/2017 was normal.MRI of the brain 05/26/17 showed no acute abnormality, generalized  atrophy, moderate supratentorium small vessel disease. He continues to live with his daughter Tyrone Noble.  She reports that he has a good appetite and sleeping well at night no hallucinations no wandering behavior he returns for reevaluation  UPDATE Feb 26 2018: He is accompanied by his daughter and son in law at visit. He is overall stable,continue to complains of short term memory loss, slow walking since 2018, sleeps too much, poor appetite,   Last seizure was on Nov 30th 2019, he was awake, not responsive,  Feb 18 2018,  Sudden onset of disorientation confusion   He is taking Depakote DR 500 mg 2 tablets every night  Virtual Visit via Video  I connected with Tyrone Heinzheodore F Mcclafferty on 09/12/18 at  by Video and verified that I am speaking with the correct person using two identifiers.   I discussed the limitations, risks, security and privacy concerns of performing an evaluation and management service by video and the availability of in person appointments. I also discussed with the patient that there may be a patient responsible charge related to this service. The patient expressed understanding and agreed to proceed.   History of Present Illness: He is with his daughter Tyrone Noble at visit. He was noted to have increased confusion, he has recurrent seizure on September 06, 2018, was treated at ED, he slumped over at the dinner table, had convulsion, lasting for 2 minutes,,  He is also taking keppra 500mg  bid, Depakote ER 500mg  2 tabs at night.  Before that he had recurrent seizure in March 2020  He continue has mild gait abnormality, taking Sinemet  25/100 mg 3 times a day, which was helpful   Observations/Objective: I have reviewed problem lists, medications, allergies.  Awake, alert, rely on his daughter to provide history, following command, masked face, leaning forward, unsteady gait, small stride, decreased arm swing  Assessment and Plan: 68 y.o. year old male  Dementia Parkinsonian  features Complex partial seizure, most recurrent seizure on June 19, 202020  continue with Depakote ER 500 mg 2 tablets every night, increase Keppra to 500 mg 1 in the morning 2 at nighttime  Keep Sinemet 25/100 mg 3 times daily     Follow Up Instructions:   In 3 months   I discussed the assessment and treatment plan with the patient. The patient was provided an opportunity to ask questions and all were answered. The patient agreed with the plan and demonstrated an understanding of the instructions.   The patient was advised to call back or seek an in-person evaluation if the symptoms worsen or if the condition fails to improve as anticipated.  I provided 30  minutes of non-face-to-face time during this encounter.   Marcial Pacas, MD. Ph.D.

## 2018-09-27 ENCOUNTER — Encounter (HOSPITAL_COMMUNITY): Payer: Self-pay | Admitting: Emergency Medicine

## 2018-09-27 ENCOUNTER — Emergency Department (HOSPITAL_COMMUNITY)
Admission: EM | Admit: 2018-09-27 | Discharge: 2018-09-28 | Disposition: A | Discharge status: Home / Self Care | Payer: Medicare HMO | Attending: Emergency Medicine | Admitting: Emergency Medicine

## 2018-09-27 ENCOUNTER — Other Ambulatory Visit: Payer: Self-pay

## 2018-09-27 ENCOUNTER — Emergency Department (HOSPITAL_COMMUNITY): Payer: Medicare HMO

## 2018-09-27 DIAGNOSIS — R55 Syncope and collapse: Secondary | ICD-10-CM | POA: Insufficient documentation

## 2018-09-27 DIAGNOSIS — G20 Parkinson's disease: Secondary | ICD-10-CM | POA: Insufficient documentation

## 2018-09-27 DIAGNOSIS — F0280 Dementia in other diseases classified elsewhere without behavioral disturbance: Secondary | ICD-10-CM | POA: Insufficient documentation

## 2018-09-27 DIAGNOSIS — I4510 Unspecified right bundle-branch block: Secondary | ICD-10-CM | POA: Diagnosis not present

## 2018-09-27 DIAGNOSIS — I10 Essential (primary) hypertension: Secondary | ICD-10-CM | POA: Insufficient documentation

## 2018-09-27 DIAGNOSIS — Z7982 Long term (current) use of aspirin: Secondary | ICD-10-CM | POA: Insufficient documentation

## 2018-09-27 DIAGNOSIS — Z87891 Personal history of nicotine dependence: Secondary | ICD-10-CM | POA: Insufficient documentation

## 2018-09-27 DIAGNOSIS — R4020 Unspecified coma: Secondary | ICD-10-CM | POA: Diagnosis not present

## 2018-09-27 DIAGNOSIS — Z79899 Other long term (current) drug therapy: Secondary | ICD-10-CM | POA: Diagnosis not present

## 2018-09-27 DIAGNOSIS — R4182 Altered mental status, unspecified: Secondary | ICD-10-CM | POA: Diagnosis not present

## 2018-09-27 DIAGNOSIS — R569 Unspecified convulsions: Secondary | ICD-10-CM | POA: Diagnosis not present

## 2018-09-27 DIAGNOSIS — R911 Solitary pulmonary nodule: Secondary | ICD-10-CM | POA: Diagnosis not present

## 2018-09-27 HISTORY — DX: Unspecified convulsions: R56.9

## 2018-09-27 LAB — BASIC METABOLIC PANEL
Anion gap: 11 (ref 5–15)
BUN: 14 mg/dL (ref 8–23)
CO2: 25 mmol/L (ref 22–32)
Calcium: 9 mg/dL (ref 8.9–10.3)
Chloride: 101 mmol/L (ref 98–111)
Creatinine, Ser: 1.21 mg/dL (ref 0.61–1.24)
GFR calc Af Amer: >60 mL/min (ref >60)
GFR calc non Af Amer: >60 mL/min (ref >60)
Glucose, Bld: 106 mg/dL — ABNORMAL HIGH (ref 70–99)
Potassium: 5 mmol/L (ref 3.5–5.1)
Sodium: 137 mmol/L (ref 135–145)

## 2018-09-27 LAB — CBC WITH DIFFERENTIAL/PLATELET
Abs Immature Granulocytes: 0.01 10*3/uL (ref 0.00–0.07)
Basophils Absolute: 0 10*3/uL (ref 0.0–0.1)
Basophils Relative: 0 %
Eosinophils Absolute: 0.1 10*3/uL (ref 0.0–0.5)
Eosinophils Relative: 1 %
HCT: 42.4 % (ref 39.0–52.0)
Hemoglobin: 13.7 g/dL (ref 13.0–17.0)
Immature Granulocytes: 0 %
Lymphocytes Relative: 23 %
Lymphs Abs: 2 10*3/uL (ref 0.7–4.0)
MCH: 29.3 pg (ref 26.0–34.0)
MCHC: 32.3 g/dL (ref 30.0–36.0)
MCV: 90.8 fL (ref 80.0–100.0)
Monocytes Absolute: 0.8 10*3/uL (ref 0.1–1.0)
Monocytes Relative: 9 %
Neutro Abs: 5.8 10*3/uL (ref 1.7–7.7)
Neutrophils Relative %: 67 %
Platelets: 202 10*3/uL (ref 150–400)
RBC: 4.67 MIL/uL (ref 4.22–5.81)
RDW: 14.6 % (ref 11.5–15.5)
WBC: 8.7 10*3/uL (ref 4.0–10.5)
nRBC: 0 % (ref 0.0–0.2)

## 2018-09-27 LAB — PROTIME-INR
INR: 1.1 (ref 0.8–1.2)
Prothrombin Time: 14.1 seconds (ref 11.4–15.2)

## 2018-09-27 LAB — TROPONIN I (HIGH SENSITIVITY): Troponin I (High Sensitivity): 4 ng/L (ref <18)

## 2018-09-27 LAB — D-DIMER, QUANTITATIVE: D-Dimer, Quant: <0.27 ug/mL-FEU (ref 0.00–0.50)

## 2018-09-27 LAB — CBG MONITORING, ED: Glucose-Capillary: 102 mg/dL — ABNORMAL HIGH (ref 70–99)

## 2018-09-27 LAB — VALPROIC ACID LEVEL: Valproic Acid Lvl: 55 ug/mL (ref 50.0–100.0)

## 2018-09-27 MED ORDER — SODIUM CHLORIDE 0.9 % IV BOLUS
1000.0000 mL | Freq: Once | INTRAVENOUS | Status: AC
  Administered 2018-09-27: 1000 mL via INTRAVENOUS

## 2018-09-27 MED ORDER — SODIUM CHLORIDE 0.9 % IV SOLN: INTRAVENOUS | Status: DC

## 2018-09-27 NOTE — ED Triage Notes (Signed)
Pt BIB EMS with reports of LOC. Per EMS pt was with his family out in the yard for about 30 min. Family reports he had a syncopal episode but caught pt. Family denies pt hit the ground. Per EMS, No obvious neuro deficits. Pt Can sluggishly follow commands. AO to person and place. Pt has hx of dementia.

## 2018-09-27 NOTE — ED Provider Notes (Signed)
[facility] EMERGENCY DEPARTMENT Provider Note   CSN: [id number] Arrival date & time: 09/27/18  2040     History   Chief Complaint Chief Complaint  Patient presents with   Loss of Consciousness    HPI QUINDARRIUS JOPLIN is a 68 y.o. male.    HPI  Patient is a 68 year old male with past medical history significant for HTN, HLD, CAD, depression, anxiety, seizure disorder, early onset dementia who presents to the emergency department today via EMS for evaluation of an episode of brief loss of consciousness.  Patient is accompanied by his adult daughter, Davy Pique, whom he lives with and is his primary caretaker.  According to his daughter, they spent a good portion of the afternoon/evening outside today at a family gathering/picnic/party.  She reports that her father was standing for quite a while and began to slump and look as though he was going to fall down, she says the members were able to catch him and lower him to the ground, where she says he was unresponsive with his eyes rolled back in his head for less than a minute.  During that time, she said there was no rhythmic body movements, foaming at the mouth or other signs suggestive of seizure, though he does have a history of seizures and has had several seizures in the last few months,  but she says this was different.  She states that it was rather hot and muggy outside and she believes that he may have been dehydrated or just "overdone it."   No recent fever, chills, nausea, vomiting, diarrhea.  His Keppra was recently increased from 500 mg twice daily to 500 mg in the morning and 1000 mg at night.  He is also on Depakote ER 500 mg, 2 tablets nightly.  She reports he is at his mental status baseline at this time.  EMS reports patient was "groggy" on their arrival, but they also did not witness any seizure-like activity and also report that the patient's daughter stated the patient was at his mental status baseline by the  time they had arrived.  Past Medical History:  Diagnosis Date   Coronary artery disease    Dementia ([place])    Depression    Headache    "monthly" (02/16/2015)   Heart disease    Hypertension    Memory loss    Seizures ([place])    Stroke ([place])    "years ago" (02/16/2015)    Patient Active Problem List   Diagnosis Date Noted   Parkinsonism ([place]) 02/26/2018   Therapeutic drug monitoring 09/12/2017   Seizures ([place]) 05/31/2017   Dementia without behavioral disturbance ([place]) 05/31/2017   New-onset angina ([place]) 02/16/2015   HYPERLIPIDEMIA 12/13/2009   ALCOHOLISM 07/07/2009   CALLUS, TOE 01/03/2007   DEPRESSION, MAJOR, RECURRENT 05/17/2006   TOBACCO DEPENDENCE 05/17/2006   HYPERTENSION, BENIGN SYSTEMIC 05/17/2006   HEMORRHOIDS, NOS 05/17/2006   GASTROESOPHAGEAL REFLUX, NO ESOPHAGITIS 05/17/2006   LEG PAIN OR KNEE PAIN 05/17/2006    Past Surgical History:  Procedure Laterality Date   CARDIAC CATHETERIZATION N/A 02/16/2015   Procedure: Left Heart Cath and Coronary Angiography;  Surgeon: Charolette Forward, MD;  Location: [place] [facility];  Service: Cardiovascular;  Laterality: N/A;   CARDIAC CATHETERIZATION N/A 02/16/2015   Procedure: Coronary Stent Intervention;  Surgeon: Charolette Forward, MD;  Location: [place] [facility];  Service: Cardiovascular;  Laterality: N/A;   CARDIAC CATHETERIZATION N/A 02/16/2015   Procedure: Intravascular Pressure Wire/FFR Study;  Surgeon: Charolette Forward,  MD;  Location: [facility] IN[facility];  Service: Cardiovascular;  Laterality: N/A;   CORONARY ANGIOPLASTY     LEFT HEART CATH AND CORONARY ANGIOGRAPHY N/A 03/29/2017   Procedure: LEFT HEART CATH AND CORONARY ANGIOGRAPHY;  Surgeon: Rinaldo Cloud, MD;  Location: [facility];  Service: Cardiovascular;  Laterality: N/A;   LEFT HEART CATHETERIZATION WITH CORONARY ANGIOGRAM N/A 03/19/2012   Procedure: LEFT HEART CATHETERIZATION WITH CORONARY ANGIOGRAM;  Surgeon: Robynn Pane, MD;   Location: [facility];  Service: Cardiovascular;  Laterality: N/A;   WISDOM TOOTH EXTRACTION          Home Medications    Prior to Admission medications   Medication Sig Start Date End Date Taking? Authorizing Provider  aspirin 81 MG chewable tablet Chew 1 tablet (81 mg total) by mouth daily. 02/17/15   Rinaldo Cloud, MD  atorvastatin (LIPITOR) 20 MG tablet Take 20 mg by mouth daily.  05/08/17   [provider]  azithromycin (ZITHROMAX) 250 MG tablet Take 1 tablet (250 mg total) by mouth daily. Take first 2 tablets together, then 1 every day until finished. Patient not taking: Reported on 06/18/2018 06/05/18   Charlynne Pander, MD  carbidopa-levodopa (SINEMET IR) 25-100 MG tablet Take 1 tablet by mouth 3 (three) times daily. 09/12/18   Levert Feinstein, MD  divalproex (DEPAKOTE ER) 500 MG 24 hr tablet Take 2 tablets (1,000 mg total) by mouth at bedtime. 06/06/18   Levert Feinstein, MD  levETIRAcetam (KEPPRA) 500 MG tablet One in the am, 2 tabs at night 09/12/18   Levert Feinstein, MD  lisinopril (PRINIVIL,ZESTRIL) 20 MG tablet Take 1 tablet (20 mg total) by mouth daily. 04/21/18   Luevenia Maxin, Mina A, PA-C  metoprolol succinate (TOPROL-XL) 25 MG 24 hr tablet Take 25 mg by mouth daily. 11/19/15   [provider]  Multiple Vitamins-Minerals (MULTIVITAMIN WITH MINERALS) tablet Take 1 tablet by mouth daily.    [provider]  risperiDONE (RISPERDAL) 1 MG tablet Take 1 tablet (1 mg total) by mouth at bedtime. Patient taking differently: Take 1 mg by mouth daily.  02/26/18   Levert Feinstein, MD    Family History Family History  Problem Relation Age of Onset   Diabetes Mother    Hypertension Mother    Stroke Father    Cancer Sister     Social History Social History   Tobacco Use   Smoking status: Former Smoker   Smokeless tobacco: Never Used   Tobacco comment: quit many years ago  Substance Use Topics   Alcohol use: Yes    Comment: 02/16/2015 "last alcohol was in ~ 2012"   Drug  use: No     Allergies   Aricept [donepezil hcl] and Penicillins   Review of Systems Review of Systems  All other systems reviewed and are negative. ROS as per HPI above.   Physical Exam Updated Vital Signs BP (!) 146/97    Pulse 66    Temp 98.8 F (37.1 C)    Resp 16    Ht  (1.702 m)    Wt 59 kg    SpO2 98%    BMI 20.36 kg/m   Physical Exam Vitals signs and nursing note reviewed.  Constitutional:      Appearance: Normal appearance. He is well-developed. He is not ill-appearing or diaphoretic.  HENT:     Head: Normocephalic and atraumatic.     Right Ear: External ear normal.     Left Ear: External ear normal.  Nose: Nose normal. No congestion.     Mouth/Throat:     Mouth: Mucous membranes are moist.  Eyes:     General: No scleral icterus.    Extraocular Movements: Extraocular movements intact.     Conjunctiva/sclera: Conjunctivae normal.     Pupils: Pupils are equal, round, and reactive to light.  Neck:     Musculoskeletal: Neck supple.  Cardiovascular:     Rate and Rhythm: Normal rate and regular rhythm.     Pulses: Normal pulses.     Heart sounds: No murmur.  Pulmonary:     Effort: Pulmonary effort is normal. No respiratory distress.     Breath sounds: Normal breath sounds.  Abdominal:     General: Abdomen is flat. Bowel sounds are normal.     Palpations: Abdomen is soft.     Tenderness: There is no abdominal tenderness.  Musculoskeletal: Normal range of motion.  Skin:    General: Skin is warm and dry.     Capillary Refill: Capillary refill takes less than 2 seconds.     Coloration: Skin is not pale.  Neurological:     General: No focal deficit present.     Mental Status: He is alert. Mental status is at baseline.     GCS: GCS eye subscore is 4. GCS verbal subscore is 4. GCS motor subscore is 6.     Comments:  Cranial Nerves:  II: Intact to confrontation.  III, IV, VI:  Pupils equal, round and reactive to light . Full eye movements without  nystagmus  V, VII: Facial sensation intact bilaterally. No weakness of masticatory muscles and smile is symmetric VIII Auditory Acuity: Grossly normal  IX/X: The uvula is midline; the palate elevates symmetrically  XI: shoulder shrug symmetric  XII: The tongue protrudes midline.    Motor System: Muscle Strength: 5/5 and symmetric in the upper and lower extremities. No pronation or drift of BUE. Muscle Tone: Tone and muscle bulk are symmetric in the upper and lower extremities.   Reflexes: not assessed Coordination:  Intact finger-to-nose and no tremor.  Sensation: Intact to light touch Gait: deferred given his presenting symptom of syncope      ED Treatments / Results  Labs (all labs ordered are listed, but only abnormal results are displayed) Labs Reviewed  BASIC METABOLIC PANEL - Abnormal; Notable for the following components:      Result Value   Glucose, Bld 106 (*)    All other components within normal limits  CBG MONITORING, ED - Abnormal; Notable for the following components:   Glucose-Capillary 102 (*)    All other components within normal limits  CBC WITH DIFFERENTIAL/PLATELET  PROTIME-INR  D-DIMER, QUANTITATIVE (NOT [facility])  VALPROIC ACID LEVEL  TROPONIN I (HIGH SENSITIVITY)  TROPONIN I (HIGH SENSITIVITY)    EKG EKG Interpretation  Date/Time:  Friday September 27 2018 20:43:07 EDT Ventricular Rate:  68 PR Interval:    QRS Duration: 143 QT Interval:  432 QTC Calculation: 460 R Axis:   -78 Text Interpretation:  Sinus rhythm RBBB and LAFB No significant change since last tracing Confirmed by Melene PlanFloyd, Dan [phone]([id number]) on 09/27/2018 11:04:29 PM   Radiology Dg Chest 1 View  Result Date: 09/27/2018 CLINICAL DATA:  Syncope EXAM: CHEST  1 VIEW COMPARISON:  September 05, 2018 FINDINGS: A nodular density projects over the left base is identified. The heart, hila, mediastinum, lungs, and pleura are otherwise unremarkable. IMPRESSION: The nodular density projected over the left lung  base is favored represent  a nipple shadow. A repeat study with nipple markers could confirm. No other acute abnormalities. Electronically Signed   By: Gerome Samavid  Emiel Kielty III M.D   On: 09/27/2018 21:53   Ct Head Wo Contrast  Result Date: 09/27/2018 CLINICAL DATA:  Altered mental status and syncopal episode EXAM: CT HEAD WITHOUT CONTRAST TECHNIQUE: Contiguous axial images were obtained from the base of the skull through the vertex without intravenous contrast. COMPARISON:  09/06/2018 FINDINGS: Brain: Chronic atrophic and white ischemic changes are noted. No findings to suggest acute hemorrhage, acute infarction or space-occupying mass lesion is noted. Vascular: No hyperdense vessel or unexpected calcification. Skull: Normal. Negative for fracture or focal lesion. Sinuses/Orbits: No acute finding. Other: None. IMPRESSION: Chronic atrophic and ischemic changes without acute abnormality. Electronically Signed   By: Alcide CleverMark  Lukens M.D.   On: 09/27/2018 22:45    Procedures Procedures (including critical care time)  Medications Ordered in ED Medications  sodium chloride 0.9 % bolus 1,000 mL (0 mLs Intravenous Stopped 09/28/18 0015)     Initial Impression / Assessment and Plan / ED Course  I have reviewed the triage vital signs and the nursing notes.  Pertinent labs & imaging results that were available during my care of the patient were reviewed by me and considered in my medical decision making (see chart for details).  Differentials considered: CVA, TIA, breakthrough seizure, TBI, hypoglycemia, cardiac dysrhythmia, ACS, pulmonary embolism, dehydration  EM Physician interpretation of Labs & Imaging:  CBC and metabolic panel unremarkable  Negative d-dimer  Negative high-sensitivity troponin  Therapeutic valproic acid level  Medical Decision Making:  Jyl Heinzheodore F Mccasland is a 68 y.o. male with past medical history as above who presents to the emergency department today via EMS after reported brief  loss of consciousness.  He arrived afebrile and hemodynamically stable and reportedly at his mental status baseline according to his daughter at the bedside who is his primary caretaker.  She was present and witnessed the episode and denied it being similar to any of his prior seizures, so therefore unlikely and no witnessed postictal state.  No recent illness and no infectious symptoms, pneumonia unlikely, HPI not consistent with ACS, baseline EKG and negative troponin.  No significant electrolyte abnormalities.  Normal chest x-ray and CT head.  I agree that this episode is unlikely a breakthrough seizure.  He was given 1 L of IV fluid and monitored on bedside telemetry throughout his ED course, no witnessed episodes of arrhythmia.  He has reportedly had similar episodes in the past, and as the daughter described that he spent a good bit of portion of the day outside today, so it is not unlikely that she could have had a brief period of hypotension/orthostasis.  He is currently hemodynamically stable and at his baseline with quite reassuring work-up at this time.  He has a great support system and a daughter that takes care of him and will be able to keep a close eye on him.  The family is in agreement with the disposition of discharged home at this time and agrees to return to the emergency department as needed with development of any new, concerning or worsening symptoms.  While we were unable to identify a specific cause for the patient's reported symptoms today in the emergency department, I am confident that we have been able to rule out emergent causes for the patient's symptoms, and as such feel that the discharge disposition is reasonable and appropriate at this time with close follow-up with his primary  care physician.  CLINICAL IMPRESSION: 1. Syncope, unspecified syncope type      Disposition: Discharge  Key discharge instructions: Strict return precautions provided. Patient was encouraged  to return to the ED should they experience worsening or persistence of current symptoms, or should they develop new concerning symptoms. Encouraged them to f/u with their PCP on an outpatient basis. Questions regarding the diagnosis were answered, and side effects regarding therapies were provided in writing or orally. Patient discharged in stable condition.   The plan for this patient was discussed with my attending physician, Dr. Melene Planan Floyd, who voiced agreement and who oversaw evaluation and treatment of this patient.   Whitni Pasquini A. Mayford KnifeWilliams, MD Resident Physician, PGY-3 Emergency Medicine [facility] [employer]    Saverio DankerWilliams, Sherron Mapp A, MD 09/28/18 1859    Melene PlanFloyd, Dan, DO 09/30/18 [id number]

## 2018-10-09 DIAGNOSIS — F0391 Unspecified dementia with behavioral disturbance: Secondary | ICD-10-CM | POA: Diagnosis not present

## 2018-10-09 DIAGNOSIS — G40909 Epilepsy, unspecified, not intractable, without status epilepticus: Secondary | ICD-10-CM | POA: Diagnosis not present

## 2018-10-09 DIAGNOSIS — I10 Essential (primary) hypertension: Secondary | ICD-10-CM | POA: Diagnosis not present

## 2018-10-09 DIAGNOSIS — R55 Syncope and collapse: Secondary | ICD-10-CM | POA: Diagnosis not present

## 2018-10-25 DIAGNOSIS — G40909 Epilepsy, unspecified, not intractable, without status epilepticus: Secondary | ICD-10-CM | POA: Diagnosis not present

## 2018-10-25 DIAGNOSIS — E785 Hyperlipidemia, unspecified: Secondary | ICD-10-CM | POA: Diagnosis not present

## 2018-10-25 DIAGNOSIS — I25118 Atherosclerotic heart disease of native coronary artery with other forms of angina pectoris: Secondary | ICD-10-CM | POA: Diagnosis not present

## 2018-10-25 DIAGNOSIS — I10 Essential (primary) hypertension: Secondary | ICD-10-CM | POA: Diagnosis not present

## 2018-10-25 DIAGNOSIS — F17290 Nicotine dependence, other tobacco product, uncomplicated: Secondary | ICD-10-CM | POA: Diagnosis not present

## 2018-10-25 DIAGNOSIS — G20 Parkinson's disease: Secondary | ICD-10-CM | POA: Diagnosis not present

## 2018-12-16 ENCOUNTER — Other Ambulatory Visit: Payer: Self-pay | Admitting: *Deleted

## 2018-12-16 ENCOUNTER — Telehealth: Payer: Self-pay | Admitting: Neurology

## 2018-12-16 MED ORDER — LEVETIRACETAM 500 MG PO TABS: 1000.0000 mg | ORAL_TABLET | Freq: Two times a day (BID) | ORAL | 4 refills | Status: DC

## 2018-12-16 MED ORDER — DIVALPROEX SODIUM ER 500 MG PO TB24: 1000.0000 mg | ORAL_TABLET | Freq: Every day | ORAL | 4 refills | Status: DC

## 2018-12-16 NOTE — Telephone Encounter (Signed)
I have increase keppra to 500mg  2 tabs bid and Depakote ER 500mg  2 tabs every night.  Give him a follow up with Clarise Cruz at next available.

## 2018-12-16 NOTE — Addendum Note (Signed)
Addended by: Marcial Pacas on: 12/16/2018 04:31 PM   Modules accepted: Orders

## 2018-12-16 NOTE — Telephone Encounter (Signed)
I returned the call to his caregiver, [name]. I reviewed the ordered changes with him.  He verbalized understanding and repeated the directions back to me correctly.  Dr. Krista Blue and Sarah's schedules are both full next week.  He has been added to see Debbora Presto, NP for his seizure follow up appt.

## 2018-12-16 NOTE — Telephone Encounter (Signed)
I spoke to his son-in-law, [name], who is also his full time caregiver.  Reports patient had a witnessed seizure this morning. He is doing better this afternoon, returning to baseline. Denies any missed doses of medication. He is currently taking the following medications:  1) Depakote ER 500mg , two tabs QHS 3) Keppra 500mg , one tab in am, two tabs in pm

## 2018-12-16 NOTE — Telephone Encounter (Signed)
Report of seizure from 9:09am-9:17am today , pts relative left on voicemail he was advised to call in and report he is requesting a call back [phone]

## 2018-12-24 ENCOUNTER — Telehealth: Payer: Self-pay | Admitting: Family Medicine

## 2018-12-24 ENCOUNTER — Ambulatory Visit (INDEPENDENT_AMBULATORY_CARE_PROVIDER_SITE_OTHER): Payer: Medicare HMO | Admitting: Family Medicine

## 2018-12-24 ENCOUNTER — Encounter: Payer: Self-pay | Admitting: *Deleted

## 2018-12-24 ENCOUNTER — Other Ambulatory Visit: Payer: Self-pay

## 2018-12-24 ENCOUNTER — Encounter: Payer: Self-pay | Admitting: Family Medicine

## 2018-12-24 VITALS — BP 105/75 | HR 83 | Temp 97.5°F | Ht 67.0 in | Wt 161.2 lb

## 2018-12-24 DIAGNOSIS — G20 Parkinson's disease: Secondary | ICD-10-CM

## 2018-12-24 DIAGNOSIS — F0390 Unspecified dementia without behavioral disturbance: Secondary | ICD-10-CM | POA: Diagnosis not present

## 2018-12-24 DIAGNOSIS — R569 Unspecified convulsions: Secondary | ICD-10-CM | POA: Diagnosis not present

## 2018-12-24 MED ORDER — LEVETIRACETAM 500 MG PO TABS: 1000.0000 mg | ORAL_TABLET | Freq: Two times a day (BID) | ORAL | 4 refills | Status: DC

## 2018-12-24 NOTE — Telephone Encounter (Signed)
Pt caretaker is asking that the levETIRAcetam (KEPPRA) 500 MG tablet goes thru [facility] and not [id number]

## 2018-12-24 NOTE — Telephone Encounter (Signed)
Done.  Cancelled via VM the escribed prescription to [facility].

## 2018-12-24 NOTE — Addendum Note (Signed)
Addended by: Brandon Melnick on: 12/24/2018 04:17 PM   Modules accepted: Orders

## 2018-12-24 NOTE — Progress Notes (Signed)
PATIENT: Tyrone Noble DOB: 04-21-1950  REASON FOR VISIT: follow up HISTORY FROM: patient  Chief Complaint  Patient presents with   Follow-up    Seizure f/u. Son in law present. Rm 5. Patient's son in law mentioned that his last seizure was last Thursday(12-19-2018)      HISTORY OF PRESENT ILLNESS: Today 12/24/18 Tyrone Noble is a 68 y.o. male here today for follow up for continued seizures. Recently seen in follow up for PD by Dr Terrace ArabiaYan. He is present today with his son-in-law, Tyrone Noble, who aids in history. He typically has 1-2 seizures monthly. Seizure starts with eye rolling then clutching of hands and becomes unresponsive. He called our office on 9/28 and advised by Dr Terrace ArabiaYan to increase Keppra to 1000mg  twice daily (from 500mg  in am and 1000mg  in pm) and continue Depakote ER to 1000mg  at night. He had another seizure three days later. Keppra is usually taken around 7-7:30 with breakfast and water. Keppra and Depakote are given at 7-9pm. No missed doses of medication. No other new medications added recently. No falls. No head injuries. He is eating well. He is drinking about 4-6 bottles of water daily. Otherwise, he is stable from previous follow up with Dr Terrace ArabiaYan in 08/2018. Memory continues to decline gradually. He continues Sinemet as prescribed with some benefit.   HISTORY: (copied from Dr Zannie CoveYan's note on 09/12/2018)  He is with his daughter Tyrone Noble at visit. He was noted to have increased confusion, he has recurrent seizure on September 06, 2018, was treated at ED, he slumped over at the dinner table, had convulsion, lasting for 2 minutes,,  He is also taking keppra 500mg  bid, Depakote ER 500mg  2 tabs at night.  Before that he had recurrent seizure in March 2020  He continue has mild gait abnormality, taking Sinemet 25/100 mg 3 times a day, which was helpful   REVIEW OF SYSTEMS: Out of a complete 14 system review of symptoms, the patient complains only of the following symptoms,  memory loss, seizure, weakness, tremors, facial drooping during seizure and all other reviewed systems are negative.  ALLERGIES: Allergies  Allergen Reactions   Aricept [Donepezil Hcl] Nausea Only   Penicillins Nausea And Vomiting    Has patient had a PCN reaction causing immediate rash, facial/tongue/throat swelling, SOB or lightheadedness with hypotension: No  Has patient had a PCN reaction causing severe rash involving mucus membranes or skin necrosis: No  Has patient had a PCN reaction that required hospitalization: No  Has patient had a PCN reaction occurring within the last 10 years: No  If all of the above answers are "NO", then may proceed with Cephalosporin use.    HOME MEDICATIONS: Outpatient Medications Prior to Visit  Medication Sig Dispense Refill   aspirin 81 MG chewable tablet Chew 1 tablet (81 mg total) by mouth daily. 30 tablet 3   aspirin EC 81 MG tablet Take 81 mg by mouth daily.     atorvastatin (LIPITOR) 20 MG tablet Take 20 mg by mouth daily.   5   carbidopa-levodopa (SINEMET IR) 25-100 MG tablet Take 1 tablet by mouth 3 (three) times daily. 270 tablet 4   divalproex (DEPAKOTE ER) 500 MG 24 hr tablet Take 2 tablets (1,000 mg total) by mouth at bedtime. 180 tablet 4   levETIRAcetam (KEPPRA) 500 MG tablet Take 2 tablets (1,000 mg total) by mouth 2 (two) times daily. 360 tablet 4   lisinopril (PRINIVIL,ZESTRIL) 20 MG tablet Take 1 tablet (  20 mg total) by mouth daily. 30 tablet 0   losartan (COZAAR) 25 MG tablet Take 25 mg by mouth daily.     metoprolol succinate (TOPROL-XL) 25 MG 24 hr tablet Take 25 mg by mouth daily.     Multiple Vitamins-Minerals (MULTIVITAMIN WITH MINERALS) tablet Take 1 tablet by mouth daily.     risperiDONE (RISPERDAL) 1 MG tablet Take 1 tablet (1 mg total) by mouth at bedtime. 30 tablet 11   No facility-administered medications prior to visit.     PAST MEDICAL HISTORY: Past Medical History:  Diagnosis Date   Coronary artery  disease    Dementia (HCC)    Depression    Headache    "monthly" (02/16/2015)   Heart disease    Hypertension    Memory loss    Seizures (HCC)    Stroke (HCC)    "years ago" (02/16/2015)    PAST SURGICAL HISTORY: Past Surgical History:  Procedure Laterality Date   CARDIAC CATHETERIZATION N/A 02/16/2015   Procedure: Left Heart Cath and Coronary Angiography;  Surgeon: Rinaldo Cloud, MD;  Location: [facility] INVASIVE CV LAB;  Service: Cardiovascular;  Laterality: N/A;   CARDIAC CATHETERIZATION N/A 02/16/2015   Procedure: Coronary Stent Intervention;  Surgeon: Rinaldo Cloud, MD;  Location: [facility] INVASIVE CV [facility];  Service: Cardiovascular;  Laterality: N/A;   CARDIAC CATHETERIZATION N/A 02/16/2015   Procedure: Intravascular Pressure Wire/FFR Study;  Surgeon: Rinaldo Cloud, MD;  Location: [facility] INVASIVE CV LAB;  Service: Cardiovascular;  Laterality: N/A;   CORONARY ANGIOPLASTY     LEFT HEART CATH AND CORONARY ANGIOGRAPHY N/A 03/29/2017   Procedure: LEFT HEART CATH AND CORONARY ANGIOGRAPHY;  Surgeon: Rinaldo Cloud, MD;  Location: [facility] INVASIVE CV LAB;  Service: Cardiovascular;  Laterality: N/A;   LEFT HEART CATHETERIZATION WITH CORONARY ANGIOGRAM N/A 03/19/2012   Procedure: LEFT HEART CATHETERIZATION WITH CORONARY ANGIOGRAM;  Surgeon: Robynn Pane, MD;  Location: [facility] CATH LAB;  Service: Cardiovascular;  Laterality: N/A;   WISDOM TOOTH EXTRACTION      FAMILY HISTORY: Family History  Problem Relation Age of Onset   Diabetes Mother    Hypertension Mother    Stroke Father    Cancer Sister     SOCIAL HISTORY: Social History   Socioeconomic History   Marital status: Divorced    Spouse name: Not on file   Number of children: 2   Years of education: 12   Highest education level: Not on file  Occupational History    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   Food insecurity    Worry: Not on file    Inability: Not on file    Transportation needs    Medical: Not on file    Non-medical: Not on file  Tobacco Use   Smoking status: Former Smoker   Smokeless tobacco: Never Used   Tobacco comment: quit many years ago  Substance and Sexual Activity   Alcohol use: Yes    Comment: 02/16/2015 "last alcohol was in ~ 2012"   Drug use: No   Sexual activity: Not Currently  Lifestyle   Physical activity    Days per week: Not on file    Minutes per session: Not on file   Stress: Not on file  Relationships   Social connections    Talks on phone: Not on file    Gets together: Not on file    Attends religious service: Not on file    Active member of club  or organization: Not on file    Attends meetings of clubs or organizations: Not on file    Relationship status: Not on file   Intimate partner violence    Fear of current or ex partner: Not on file    Emotionally abused: Not on file    Physically abused: Not on file    Forced sexual activity: Not on file  Other Topics Concern   Not on file  Social History Narrative   Lives with daughter    caffeine -coffee,  1 cup daily      PHYSICAL EXAM  Vitals:   12/24/18 1319  BP: 105/75  Pulse: 83  Temp: (!) 97.5 F (36.4 C)  TempSrc: Oral  Weight: 161 lb 3.2 oz (73.1 kg)  Height: 5\' 7"  (1.702 m)   Body mass index is 25.25 kg/m.  Generalized: Well developed, in no acute distress  Cardiology: normal rate and rhythm, no murmur noted Neurological examination  Mentation: Alert not oriented to time, he is aware that he is in [place], [place] at "the doctor." not oriented to history taking. Follows most commands speech and language fluent Cranial nerve II-XII: Pupils were equal round reactive to light. Extraocular movements were full, visual field were full on confrontational test. Facial sensation and strength were normal. Uvula tongue midline. Head turning and shoulder shrug  were normal and symmetric. Motor: The motor testing reveals 5 over 5 strength of  all 4 extremities. Good symmetric motor tone is noted throughout. Bradykinesia noted of bilateral upper extremities Sensory: Sensory testing is intact to soft touch on all 4 extremities. No evidence of extinction is noted.  Coordination: Cerebellar testing reveals slow finger-nose-finger and heel-to-shin bilaterally.  Gait and station: Gait is short, decreased arm swing. Tandem not attempted     DIAGNOSTIC DATA (LABS, IMAGING, TESTING) - I reviewed patient records, labs, notes, testing and imaging myself where available.  MMSE - Mini Mental State Exam 09/12/2017 05/31/2017 05/03/2016  Orientation to time 1 1 3   Orientation to Place 4 3 5   Registration 3 3 3   Attention/ Calculation 5 3 0  Recall 0 0 1  Language- name 2 objects 2 2 2   Language- repeat 1 1 0  Language- follow 3 step command 3 3 2   Language- follow 3 step command-comments - - did not fold paper  Language- read & follow direction 1 1 1   Write a sentence 1 1 0  Copy design 0 0 0  Total score 21 18 17      Lab Results  Component Value Date   WBC 8.7 09/27/2018   HGB 13.7 09/27/2018   HCT 42.4 09/27/2018   MCV 90.8 09/27/2018   PLT 202 09/27/2018      Component Value Date/Time   NA 137 09/27/2018 2210   K 5.0 09/27/2018 2210   CL 101 09/27/2018 2210   CO2 25 09/27/2018 2210   GLUCOSE 106 (H) 09/27/2018 2210   BUN 14 09/27/2018 2210   CREATININE 1.21 09/27/2018 2210   CALCIUM 9.0 09/27/2018 2210   PROT 6.8 06/18/2018 0938   ALBUMIN 3.6 06/18/2018 0938   AST 19 06/18/2018 0938   ALT 7 06/18/2018 0938   ALKPHOS 61 06/18/2018 0938   BILITOT 0.8 06/18/2018 0938   GFRNONAA >60 09/27/2018 2210   GFRAA >60 09/27/2018 2210   Lab Results  Component Value Date   CHOL 186 03/18/2012   HDL 72 03/18/2012   LDLCALC 105 (H) 03/18/2012   TRIG 43 03/18/2012   CHOLHDL  2.6 03/18/2012   Lab Results  Component Value Date   HGBA1C 5.6 02/16/2016   Lab Results  Component Value Date   VITAMINB12 402 02/16/2016   Lab  Results  Component Value Date   TSH 2.120 12/28/2015     ASSESSMENT AND PLAN 68 y.o. year old male  has a past medical history of Coronary artery disease, Dementia (HCC), Depression, Headache, Heart disease, Hypertension, Memory loss, Seizures (HCC), and Stroke (HCC). here with     ICD-10-CM   1. Seizures (HCC)  R56.9 CBC with Differential/Platelets    CMP    Levetiracetam level    Valproic Acid Level  2. Parkinsonism, unspecified Parkinsonism type (HCC)  G20 CBC with Differential/Platelets    CMP  3. Dementia without behavioral disturbance, unspecified dementia type (HCC)  F03.90 CBC with Differential/Platelets    CMP    Mr. Almquist has had reoccurrence of seizure activity since last being seen in June.  He has tolerated increased dose of Keppra and Depakote.  Medications are taken approximately 12 hours apart with no missed doses.  Last seizure was 12/19/2018.  Seizures are similar to those in the past.  No falls or head injuries.  No signs of infection otherwise.  We will update labs today to assess serum levels of both Keppra and Depakote.  He will continue Keppra 1000 mg start (2 tablets) twice daily as well as Depakote ER 1000 mg start (2 tablets) at bedtime.  No medication changes made today as Keppra levels were increased last week.  Tyrone Martinez will continue to give medications every 12 hours.  Adequate hydration, safety and seizure precautions discussed.  He will follow-up in 6 to 8 weeks, sooner if needed.  He and his son-in-law,Ikel, verbalized understanding and agreement with this plan.   Orders Placed This Encounter  Procedures   CBC with Differential/Platelets   CMP   Levetiracetam level   Valproic Acid Level     No orders of the defined types were placed in this encounter.       Shawnie Dapper, FNP-C 12/24/2018, 2:17 PM [facility] [address] [place], [place] [address] [phone]

## 2018-12-24 NOTE — Patient Instructions (Addendum)
We will continue keppra 1000mg  twice daily (2 tablets twice daily) as well as Depakote 1000mg   (2 tablets at bedtime) at bedtime.   We will check labs today.   Follow up in 6-8 weeks   Seizure, Adult A seizure is a sudden burst of abnormal electrical activity in the brain. Seizures usually last from 30 seconds to 2 minutes. They can cause many different symptoms. Usually, seizures are not harmful unless they last a long time. What are the causes? Common causes of this condition include:  Fever or infection.  Conditions that affect the brain, such as: ? A brain abnormality that you were born with. ? A brain or head injury. ? Bleeding in the brain. ? A tumor. ? Stroke. ? Brain disorders such as autism or cerebral palsy.  Low blood sugar.  Conditions that are passed from parent to child (are inherited).  Problems with substances, such as: ? Having a reaction to a drug or a medicine. ? Suddenly stopping the use of a substance (withdrawal). In some cases, the cause may not be known. A person who has repeated seizures over time without a clear cause has a condition called epilepsy. What increases the risk? You are more likely to get this condition if you have:  A family history of epilepsy.  Had a seizure in the past.  A brain disorder.  A history of head injury, lack of oxygen at birth, or strokes. What are the signs or symptoms? There are many types of seizures. The symptoms vary depending on the type of seizure you have. Examples of symptoms during a seizure include:  Shaking (convulsions).  Stiffness in the body.  Passing out (losing consciousness).  Head nodding.  Staring.  Not responding to sound or touch.  Loss of bladder control and bowel control. Some people have symptoms right before and right after a seizure happens. Symptoms before a seizure may include:  Fear.  Worry (anxiety).  Feeling like you may vomit (nauseous).  Feeling like the room is  spinning (vertigo).  Feeling like you saw or heard something before (dj vu).  Odd tastes or smells.  Changes in how you see. You may see flashing lights or spots. Symptoms after a seizure happens can include:  Confusion.  Sleepiness.  Headache.  Weakness on one side of the body. How is this treated? Most seizures will stop on their own in under 5 minutes. In these cases, no treatment is needed. Seizures that last longer than 5 minutes will usually need treatment. Treatment can include:  Medicines given through an IV tube.  Avoiding things that are known to cause your seizures. These can include medicines that you take for another condition.  Medicines to treat epilepsy.  Surgery to stop the seizures. This may be needed if medicines do not help. Follow these instructions at home: Medicines  Take over-the-counter and prescription medicines only as told by your doctor.  Do not eat or drink anything that may keep your medicine from working, such as alcohol. Activity  Do not do any activities that would be dangerous if you had another seizure, like driving or swimming. Wait until your doctor says it is safe for you to do them.  If you live in the [place] ask your local DMV (department of motor vehicles) when you can drive.  Get plenty of rest. Teaching others Teach friends and family what to do when you have a seizure. They should:  Lay you on the ground.  Protect your head  and body.  Loosen any tight clothing around your neck.  Turn you on your side.  Not hold you down.  Not put anything into your mouth.  Know whether or not you need emergency care.  Stay with you until you are better.  General instructions  Contact your doctor each time you have a seizure.  Avoid anything that gives you seizures.  Keep a seizure diary. Write down: ? What you think caused each seizure. ? What you remember about each seizure.  Keep all follow-up visits as told by your  doctor. This is important. Contact a doctor if:  You have another seizure.  You have seizures more often.  There is any change in what happens during your seizures.  You keep having seizures with treatment.  You have symptoms of being sick or having an infection. Get help right away if:  You have a seizure that: ? Lasts longer than 5 minutes. ? Is different than seizures you had before. ? Makes it harder to breathe. ? Happens after you hurt your head.  You have any of these symptoms after a seizure: ? Not being able to speak. ? Not being able to use a part of your body. ? Confusion. ? A bad headache.  You have two or more seizures in a row.  You do not wake up right after a seizure.  You get hurt during a seizure. These symptoms may be an emergency. Do not wait to see if the symptoms will go away. Get medical help right away. Call your local emergency services (911 in the [place] Do not drive yourself to the hospital. Summary  Seizures usually last from 30 seconds to 2 minutes. Usually, they are not harmful unless they last a long time.  Do not eat or drink anything that may keep your medicine from working, such as alcohol.  Teach friends and family what to do when you have a seizure.  Contact your doctor each time you have a seizure. This information is not intended to replace advice given to you by your health care provider. Make sure you discuss any questions you have with your health care provider. Document Released: 08/23/2007 Document Revised: 05/24/2018 Document Reviewed: 05/24/2018 Elsevier Patient Education  2020 [employer].

## 2018-12-24 NOTE — Progress Notes (Signed)
I have reviewed and agreed above plan. 

## 2018-12-26 ENCOUNTER — Telehealth: Payer: Self-pay | Admitting: *Deleted

## 2018-12-26 ENCOUNTER — Encounter: Payer: Self-pay | Admitting: *Deleted

## 2018-12-26 LAB — COMPREHENSIVE METABOLIC PANEL
ALT: 20 IU/L (ref 0–44)
AST: 17 IU/L (ref 0–40)
Albumin/Globulin Ratio: 2 (ref 1.2–2.2)
Albumin: 4.4 g/dL (ref 3.8–4.8)
Alkaline Phosphatase: 106 IU/L (ref 39–117)
BUN/Creatinine Ratio: 12 (ref 10–24)
BUN: 16 mg/dL (ref 8–27)
Bilirubin Total: 0.2 mg/dL (ref 0.0–1.2)
CO2: 25 mmol/L (ref 20–29)
Calcium: 9.6 mg/dL (ref 8.6–10.2)
Chloride: 104 mmol/L (ref 96–106)
Creatinine, Ser: 1.34 mg/dL — ABNORMAL HIGH (ref 0.76–1.27)
GFR calc Af Amer: 62 mL/min/{1.73_m2} (ref >59)
GFR calc non Af Amer: 54 mL/min/{1.73_m2} — ABNORMAL LOW (ref >59)
Globulin, Total: 2.2 g/dL (ref 1.5–4.5)
Glucose: 111 mg/dL — ABNORMAL HIGH (ref 65–99)
Potassium: 5 mmol/L (ref 3.5–5.2)
Sodium: 142 mmol/L (ref 134–144)
Total Protein: 6.6 g/dL (ref 6.0–8.5)

## 2018-12-26 LAB — CBC WITH DIFFERENTIAL/PLATELET
Basophils Absolute: 0 10*3/uL (ref 0.0–0.2)
Basos: 0 %
EOS (ABSOLUTE): 0.1 10*3/uL (ref 0.0–0.4)
Eos: 2 %
Hematocrit: 39.7 % (ref 37.5–51.0)
Hemoglobin: 13.6 g/dL (ref 13.0–17.7)
Immature Grans (Abs): 0 10*3/uL (ref 0.0–0.1)
Immature Granulocytes: 0 %
Lymphocytes Absolute: 2.1 10*3/uL (ref 0.7–3.1)
Lymphs: 38 %
MCH: 29.8 pg (ref 26.6–33.0)
MCHC: 34.3 g/dL (ref 31.5–35.7)
MCV: 87 fL (ref 79–97)
Monocytes Absolute: 0.6 10*3/uL (ref 0.1–0.9)
Monocytes: 11 %
Neutrophils Absolute: 2.8 10*3/uL (ref 1.4–7.0)
Neutrophils: 49 %
Platelets: 191 10*3/uL (ref 150–450)
RBC: 4.57 x10E6/uL (ref 4.14–5.80)
RDW: 13.3 % (ref 11.6–15.4)
WBC: 5.6 10*3/uL (ref 3.4–10.8)

## 2018-12-26 LAB — LEVETIRACETAM LEVEL: Levetiracetam Lvl: 38.4 ug/mL (ref 10.0–40.0)

## 2018-12-26 LAB — VALPROIC ACID LEVEL: Valproic Acid Lvl: 63 ug/mL (ref 50–100)

## 2018-12-26 NOTE — Telephone Encounter (Signed)
Labs are unremarkable with exception of elevated glucose and kidney function. I reminded him, Tyrone Noble to follow up closely with PCP for monitoring.  He verbalized understanding.

## 2018-12-26 NOTE — Telephone Encounter (Signed)
-----   Message from Debbora Presto, NP sent at 12/26/2018  8:10 AM EDT ----- Labs are unremarkable with exception of elevated glucose and kidney function. Please make sure PCP gets a copy of these and remind him to follow up closely with PCP for monitoring.

## 2018-12-26 NOTE — Telephone Encounter (Signed)
Fax confirmation received (Dr. Dorthy Cooler) with lab results.

## 2018-12-26 NOTE — Telephone Encounter (Signed)
Error

## 2019-01-18 ENCOUNTER — Encounter (HOSPITAL_COMMUNITY): Payer: Self-pay | Admitting: Obstetrics and Gynecology

## 2019-01-18 ENCOUNTER — Other Ambulatory Visit: Payer: Self-pay

## 2019-01-18 ENCOUNTER — Emergency Department (HOSPITAL_COMMUNITY): Payer: Medicare HMO

## 2019-01-18 ENCOUNTER — Emergency Department (HOSPITAL_COMMUNITY)
Admission: EM | Admit: 2019-01-18 | Discharge: 2019-01-18 | Disposition: A | Discharge status: Home / Self Care | Payer: Medicare HMO | Attending: Emergency Medicine | Admitting: Emergency Medicine

## 2019-01-18 DIAGNOSIS — Z8673 Personal history of transient ischemic attack (TIA), and cerebral infarction without residual deficits: Secondary | ICD-10-CM | POA: Diagnosis not present

## 2019-01-18 DIAGNOSIS — R443 Hallucinations, unspecified: Secondary | ICD-10-CM | POA: Insufficient documentation

## 2019-01-18 DIAGNOSIS — Z7982 Long term (current) use of aspirin: Secondary | ICD-10-CM | POA: Insufficient documentation

## 2019-01-18 DIAGNOSIS — Z79899 Other long term (current) drug therapy: Secondary | ICD-10-CM | POA: Diagnosis not present

## 2019-01-18 DIAGNOSIS — I2510 Atherosclerotic heart disease of native coronary artery without angina pectoris: Secondary | ICD-10-CM | POA: Diagnosis not present

## 2019-01-18 DIAGNOSIS — Z87891 Personal history of nicotine dependence: Secondary | ICD-10-CM | POA: Insufficient documentation

## 2019-01-18 DIAGNOSIS — G20 Parkinson's disease: Secondary | ICD-10-CM | POA: Diagnosis not present

## 2019-01-18 DIAGNOSIS — R262 Difficulty in walking, not elsewhere classified: Secondary | ICD-10-CM | POA: Diagnosis not present

## 2019-01-18 DIAGNOSIS — I10 Essential (primary) hypertension: Secondary | ICD-10-CM | POA: Insufficient documentation

## 2019-01-18 DIAGNOSIS — R442 Other hallucinations: Secondary | ICD-10-CM | POA: Diagnosis not present

## 2019-01-18 LAB — CBC WITH DIFFERENTIAL/PLATELET
Abs Immature Granulocytes: 0.01 10*3/uL (ref 0.00–0.07)
Basophils Absolute: 0 10*3/uL (ref 0.0–0.1)
Basophils Relative: 1 %
Eosinophils Absolute: 0.1 10*3/uL (ref 0.0–0.5)
Eosinophils Relative: 1 %
HCT: 42.1 % (ref 39.0–52.0)
Hemoglobin: 13.7 g/dL (ref 13.0–17.0)
Immature Granulocytes: 0 %
Lymphocytes Relative: 30 %
Lymphs Abs: 1.9 10*3/uL (ref 0.7–4.0)
MCH: 29.7 pg (ref 26.0–34.0)
MCHC: 32.5 g/dL (ref 30.0–36.0)
MCV: 91.1 fL (ref 80.0–100.0)
Monocytes Absolute: 0.7 10*3/uL (ref 0.1–1.0)
Monocytes Relative: 11 %
Neutro Abs: 3.6 10*3/uL (ref 1.7–7.7)
Neutrophils Relative %: 57 %
Platelets: 210 10*3/uL (ref 150–400)
RBC: 4.62 MIL/uL (ref 4.22–5.81)
RDW: 13.9 % (ref 11.5–15.5)
WBC: 6.3 10*3/uL (ref 4.0–10.5)
nRBC: 0 % (ref 0.0–0.2)

## 2019-01-18 LAB — COMPREHENSIVE METABOLIC PANEL
ALT: 14 U/L (ref 0–44)
AST: 21 U/L (ref 15–41)
Albumin: 4.2 g/dL (ref 3.5–5.0)
Alkaline Phosphatase: 65 U/L (ref 38–126)
Anion gap: 10 (ref 5–15)
BUN: 17 mg/dL (ref 8–23)
CO2: 25 mmol/L (ref 22–32)
Calcium: 9.2 mg/dL (ref 8.9–10.3)
Chloride: 104 mmol/L (ref 98–111)
Creatinine, Ser: 1.36 mg/dL — ABNORMAL HIGH (ref 0.61–1.24)
GFR calc Af Amer: >60 mL/min (ref >60)
GFR calc non Af Amer: 53 mL/min — ABNORMAL LOW (ref >60)
Glucose, Bld: 110 mg/dL — ABNORMAL HIGH (ref 70–99)
Potassium: 4.2 mmol/L (ref 3.5–5.1)
Sodium: 139 mmol/L (ref 135–145)
Total Bilirubin: 0.7 mg/dL (ref 0.3–1.2)
Total Protein: 7.5 g/dL (ref 6.5–8.1)

## 2019-01-18 LAB — URINALYSIS, ROUTINE W REFLEX MICROSCOPIC
Bilirubin Urine: NEGATIVE
Glucose, UA: NEGATIVE mg/dL
Hgb urine dipstick: NEGATIVE
Ketones, ur: 5 mg/dL — AB
Leukocytes,Ua: NEGATIVE
Nitrite: NEGATIVE
Protein, ur: NEGATIVE mg/dL
Specific Gravity, Urine: 1.015 (ref 1.005–1.030)
pH: 6 (ref 5.0–8.0)

## 2019-01-18 LAB — RAPID URINE DRUG SCREEN, HOSP PERFORMED
Amphetamines: NOT DETECTED
Barbiturates: NOT DETECTED
Benzodiazepines: NOT DETECTED
Cocaine: NOT DETECTED
Opiates: NOT DETECTED
Tetrahydrocannabinol: NOT DETECTED

## 2019-01-18 LAB — ETHANOL: Alcohol, Ethyl (B): <10 mg/dL (ref <10)

## 2019-01-18 NOTE — ED Notes (Signed)
NT offered pt drink and meal tray. Pt refused.

## 2019-01-18 NOTE — Discharge Instructions (Addendum)
Tyrone Noble was evaluated in the emergency department for hallucinations began today.  He had blood work EKG chest x-ray and a CAT scan of his head along with a urinalysis that did not show any obvious abnormalities.  It will be important that you contact his neurologist for close follow-up.  If you feel he is worsening and becoming a danger to himself or others please return to the emergency department.

## 2019-01-18 NOTE — ED Provider Notes (Signed)
[facility]-EMERGENCY DEPT Provider Note   CSN: [id number] Arrival date & time: 01/18/19  1418     History   Chief Complaint Chief Complaint  Patient presents with  . Hallucinations    HPI Tyrone Noble is a 68 y.o. male.  Level 5 caveat secondary to dementia.  Patient lives with his daughter and son-in-law.  They report that today he had hallucinations that there was a man and a woman in his room that were threatening to kill them or he needed to kill them.  He was afraid to returning to his room.  They have never had anything like this before.  He says he has baseline short-term memory loss and Parkinson's.  There is been no reported fevers cough vomiting abdominal pain and no recent falls.  He does have a history of seizure disorders.  There is been no recent medication changes.  This history was obtained from the patient's son-in-law Ikel.      The history is provided by the patient and a relative.  Altered Mental Status Presenting symptoms comment:  Hallucinations Severity:  Severe Most recent episode:  Today Episode history:  Single Timing:  Constant Progression:  Unchanged Chronicity:  New Context: alcohol use (remote) and dementia   Context: not recent change in medication, not recent illness and not recent infection   Associated symptoms: hallucinations   Associated symptoms: no abdominal pain, no fever, no nausea, no rash, no slurred speech and no vomiting     Past Medical History:  Diagnosis Date  . Coronary artery disease   . Dementia (HCC)   . Depression   . Headache    "monthly" (02/16/2015)  . Heart disease   . Hypertension   . Memory loss   . Seizures (HCC)   . Stroke [facility](HCC)    "years ago" (02/16/2015)    Patient Active Problem List   Diagnosis Date Noted  . Parkinsonism (HCC) 02/26/2018  . Therapeutic drug monitoring 09/12/2017  . Seizures (HCC) 05/31/2017  . Dementia without behavioral disturbance (HCC) 05/31/2017  .  New-onset angina (HCC) 02/16/2015  . HYPERLIPIDEMIA 12/13/2009  . ALCOHOLISM 07/07/2009  . CALLUS, TOE 01/03/2007  . DEPRESSION, MAJOR, RECURRENT 05/17/2006  . TOBACCO DEPENDENCE 05/17/2006  . HYPERTENSION, BENIGN SYSTEMIC 05/17/2006  . HEMORRHOIDS, NOS 05/17/2006  . GASTROESOPHAGEAL REFLUX, NO ESOPHAGITIS 05/17/2006  . LEG PAIN OR KNEE PAIN 05/17/2006    Past Surgical History:  Procedure Laterality Date  . CARDIAC CATHETERIZATION N/A 02/16/2015   Procedure: Left Heart Cath and Coronary Angiography;  Surgeon: Rinaldo CloudMohan Harwani, MD;  [facility];  Service: Cardiovascular;  Laterality: N/A;  . CARDIAC CATHETERIZATION N/A 02/16/2015   Procedure: Coronary Stent Intervention;  Surgeon: Rinaldo CloudMohan Harwani, MD;  Location: [facility] INVASIVE CV LAB;  Service: Cardiovascular;  Laterality: N/A;  . CARDIAC CATHETERIZATION N/A 02/16/2015   Procedure: Intravascular Pressure Wire/FFR Study;  Surgeon: Rinaldo CloudMohan Harwani, MD;  [facility];  Service: Cardiovascular;  Laterality: N/A;  . CORONARY ANGIOPLASTY    . LEFT HEART CATH AND CORONARY ANGIOGRAPHY N/A 03/29/2017   Procedure: LEFT HEART CATH AND CORONARY ANGIOGRAPHY;  Surgeon: Rinaldo CloudHarwani, Mohan, MD;  Location: [facility] INVASIVE CV LAB;  Service: Cardiovascular;  Laterality: N/A;  . LEFT HEART CATHETERIZATION WITH CORONARY ANGIOGRAM N/A 03/19/2012   Procedure: LEFT HEART CATHETERIZATION WITH CORONARY ANGIOGRAM;  Surgeon: Robynn PaneMohan N Harwani, MD;  Location: [facility] CATH LAB;  Service: Cardiovascular;  Laterality: N/A;  . WISDOM TOOTH EXTRACTION  Home Medications    Prior to Admission medications   Medication Sig Start Date End Date Taking? Authorizing Provider  aspirin 81 MG chewable tablet Chew 1 tablet (81 mg total) by mouth daily. 02/17/15   Charolette Forward, MD  aspirin EC 81 MG tablet Take 81 mg by mouth daily.    [provider]  atorvastatin (LIPITOR) 20 MG tablet Take 20 mg by mouth daily.  05/08/17   [provider]   carbidopa-levodopa (SINEMET IR) 25-100 MG tablet Take 1 tablet by mouth 3 (three) times daily. 09/12/18   Marcial Pacas, MD  divalproex (DEPAKOTE ER) 500 MG 24 hr tablet Take 2 tablets (1,000 mg total) by mouth at bedtime. 12/16/18   Marcial Pacas, MD  levETIRAcetam (KEPPRA) 500 MG tablet Take 2 tablets (1,000 mg total) by mouth 2 (two) times daily. 12/24/18   Lomax, Amy, NP  lisinopril (PRINIVIL,ZESTRIL) 20 MG tablet Take 1 tablet (20 mg total) by mouth daily. 04/21/18   Nils Flack, Mina A, PA-C  losartan (COZAAR) 25 MG tablet Take 25 mg by mouth daily. 12/09/18   [provider]  metoprolol succinate (TOPROL-XL) 25 MG 24 hr tablet Take 25 mg by mouth daily. 11/19/15   [provider]  Multiple Vitamins-Minerals (MULTIVITAMIN WITH MINERALS) tablet Take 1 tablet by mouth daily.    [provider]  risperiDONE (RISPERDAL) 1 MG tablet Take 1 tablet (1 mg total) by mouth at bedtime. 02/26/18   Marcial Pacas, MD    Family History Family History  Problem Relation Age of Onset  . Diabetes Mother   . Hypertension Mother   . Stroke Father   . Cancer Sister     Social History Social History   Tobacco Use  . Smoking status: Former Research scientist (life sciences)  . Smokeless tobacco: Never Used  . Tobacco comment: quit many years ago  Substance Use Topics  . Alcohol use: Not Currently    Comment: 02/16/2015 "last alcohol was in ~ 2012"  . Drug use: No     Allergies   Aricept [donepezil hcl] and Penicillins   Review of Systems Review of Systems  Unable to perform ROS: Dementia  Constitutional: Negative for fever.  HENT: Negative for sore throat.   Respiratory: Negative for shortness of breath.   Cardiovascular: Negative for chest pain.  Gastrointestinal: Negative for abdominal pain, nausea and vomiting.  Genitourinary: Negative for dysuria.  Skin: Negative for rash.  Psychiatric/Behavioral: Positive for hallucinations.     Physical Exam Updated Vital Signs BP 138/77 (BP Location: Left Arm)    Pulse 72   Temp 98.6 F (37 C) (Oral)   Resp 16   SpO2 99%   Physical Exam Vitals signs and nursing note reviewed.  Constitutional:      Appearance: He is well-developed.  HENT:     Head: Normocephalic and atraumatic.  Eyes:     Conjunctiva/sclera: Conjunctivae normal.  Neck:     Musculoskeletal: Neck supple.  Cardiovascular:     Rate and Rhythm: Normal rate and regular rhythm.     Heart sounds: No murmur.  Pulmonary:     Effort: Pulmonary effort is normal. No respiratory distress.     Breath sounds: Normal breath sounds.  Abdominal:     Palpations: Abdomen is soft.     Tenderness: There is no abdominal tenderness.  Musculoskeletal:     Right lower leg: No edema.     Left lower leg: No edema.  Skin:    General: Skin is warm and dry.  Capillary Refill: Capillary refill takes less than 2 seconds.  Neurological:     General: No focal deficit present.     Mental Status: He is alert. He is disoriented.     Comments: Patient is awake and alert and conversant.  He is able to move all extremities although he has increased tone.  No obvious facial droop.  He is oriented to place but not time.      ED Treatments / Results  Labs (all labs ordered are listed, but only abnormal results are displayed) Labs Reviewed  COMPREHENSIVE METABOLIC PANEL - Abnormal; Notable for the following components:      Result Value   Glucose, Bld 110 (*)    Creatinine, Ser 1.36 (*)    GFR calc non Af Amer 53 (*)    All other components within normal limits  URINALYSIS, ROUTINE W REFLEX MICROSCOPIC - Abnormal; Notable for the following components:   Ketones, ur 5 (*)    All other components within normal limits  ETHANOL  RAPID URINE DRUG SCREEN, HOSP PERFORMED  CBC WITH DIFFERENTIAL/PLATELET    EKG EKG Interpretation  Date/Time:  Saturday January 18 2019 15:53:30 EDT Ventricular Rate:  67 PR Interval:  166 QRS Duration: 156 QT Interval:  424 QTC Calculation: 448 R Axis:   -74 Text  Interpretation: Normal sinus rhythm Right bundle branch block Left anterior fascicular block Septal infarct , age undetermined Abnormal ECG similar to prior 7/20 Confirmed by Meridee Score [id number]) on 01/18/2019 3:56:48 PM   Radiology Dg Chest 2 View  Result Date: 01/18/2019 CLINICAL DATA:  Hallucinations.  History of dementia. EXAM: CHEST - 2 VIEW COMPARISON:  09/27/2018 FINDINGS: Cardiac silhouette normal in size and configuration. No mediastinal or hilar masses or evidence of adenopathy. Clear lungs.  No pleural effusion or pneumothorax. Skeletal structures are intact. IMPRESSION: No active cardiopulmonary disease. Electronically Signed   By: Amie Portland M.D.   On: 01/18/2019 15:38   Ct Head Wo Contrast  Result Date: 01/18/2019 CLINICAL DATA:  Hallucinations.  History of dementia. EXAM: CT HEAD WITHOUT CONTRAST TECHNIQUE: Contiguous axial images were obtained from the base of the skull through the vertex without intravenous contrast. COMPARISON:  09/27/2018 FINDINGS: Brain: No evidence of acute infarction, hemorrhage, hydrocephalus, extra-axial collection or mass lesion/mass effect. Periventricular white matter hypoattenuation is noted consistent with mild chronic microvascular ischemic change. Vascular: No hyperdense vessel or unexpected calcification. Skull: Normal. Negative for fracture or focal lesion. Sinuses/Orbits: Sinuses globes and orbits are unremarkable and mastoid air cells. Visualized are clear. Other: None. IMPRESSION: 1. No acute intracranial abnormalities. 2. Mild chronic microvascular ischemic change. Electronically Signed   By: Amie Portland M.D.   On: 01/18/2019 15:29    Procedures Procedures (including critical care time)  Medications Ordered in ED Medications - No data to display   Initial Impression / Assessment and Plan / ED Course  I have reviewed the triage vital signs and the nursing notes.  Pertinent labs & imaging results that were available during my care  of the patient were reviewed by me and considered in my medical decision making (see chart for details).  Clinical Course as of Jan 19 936  Sat Jan 18, 2019  1514 I was also able to talk to the patient's daughter Lamar Laundry who said that he has had a couple of seizures over the past few weeks and today had the hallucinations of 2 people in his room that were trying to kill him.  She said she just wants  to make sure that there is not anything new wrong with him.  She understands it might be part of the dementia.  He does have a neurologist and she is willing to have him follow-up with neurology if we will find anything during his medical work-up.  Differential diagnosis includes dementia, infection, metabolic derangement, stroke, bleeding   [MB]  1532 CT interpreted by me as no gross bleed or infarct.  Chest x-ray interpreted by me as no gross infiltrates.  Awaiting radiology reading.   [MB]  1624 Patient's lab work and imaging are unremarkable.  He is ambulated in the department to the bathroom.  He does not appear to be responding to any external stimuli.   [MB]  1639 I reviewed the patient's results with his daughter Lissa Hoard.  She is comfortable taking her home and understands to follow-up with neurology.  She also knows to bring him back if he becomes worse.   [MB]    Clinical Course User Index [MB] Terrilee Files, MD        Final Clinical Impressions(s) / ED Diagnoses   Final diagnoses:  Hallucinations    ED Discharge Orders    None       Terrilee Files, MD 01/19/19 [phone]

## 2019-01-18 NOTE — ED Triage Notes (Signed)
Per EMS: Patient has been having hallucinations. Patient reportedly has a hx of dementia. Patient's family reports to EMS that patient has been having a more difficult time walking as well.  Patient is alert to self only.

## 2019-02-06 ENCOUNTER — Ambulatory Visit (INDEPENDENT_AMBULATORY_CARE_PROVIDER_SITE_OTHER): Payer: Medicare HMO | Admitting: Neurology

## 2019-02-06 ENCOUNTER — Encounter: Payer: Self-pay | Admitting: Neurology

## 2019-02-06 ENCOUNTER — Other Ambulatory Visit: Payer: Self-pay

## 2019-02-06 VITALS — BP 131/82 | HR 70 | Temp 98.0°F | Ht 67.0 in | Wt 162.5 lb

## 2019-02-06 DIAGNOSIS — F0390 Unspecified dementia without behavioral disturbance: Secondary | ICD-10-CM | POA: Diagnosis not present

## 2019-02-06 DIAGNOSIS — G20 Parkinson's disease: Secondary | ICD-10-CM | POA: Diagnosis not present

## 2019-02-06 DIAGNOSIS — R569 Unspecified convulsions: Secondary | ICD-10-CM | POA: Diagnosis not present

## 2019-02-06 MED ORDER — LAMOTRIGINE 25 MG PO TABS: ORAL_TABLET | ORAL | 0 refills | Status: DC

## 2019-02-06 MED ORDER — LAMOTRIGINE 100 MG PO TABS: 100.0000 mg | ORAL_TABLET | Freq: Two times a day (BID) | ORAL | 11 refills | Status: DC

## 2019-02-06 NOTE — Patient Instructions (Signed)
Week Lamotrigine Keppra 500,g Depakote ER 500mg   1st week 25mg  twice a day 2 tablets  twice a day 2 tab every night  2nd week 25mg x2 tab twice aday 2 tablets twice a day 2 tab every night  3rd week 25mg x3tab twice aday I tablet  twice a day 2 tab every night  4th week 100mg  twice a day stop 2 tab every night

## 2019-02-06 NOTE — Progress Notes (Signed)
PATIENT: Tyrone Noble DOB: 09-04-1950  REASON FOR VISIT: follow up HISTORY FROM: patient  Chief Complaint  Patient presents with  . Seizures    He is here with his son-in-law and caregiver, Alcario Droughtkel Williams.  His last office visit was 12/24/2018.  He has had two seizures since that time.  No missed medications.     HISTORY OF PRESENT ILLNESS:  Tyrone Noble is a 68 year old male, seen in refer by his primary care doctor [name], [name] for evaluation of seizure, he is accompanied by his son in law Alcario Droughtkel Williams at today's clinical visit, he has lived with his daughter's family for 5 years.  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 Marjory LiesPenumalli in 2017 for dementia, he did also have a history of alcohol abuse, in remission for many years.  He presented to the emergency room on May 26, 2017 for seizure, he was ready to have breakfast, without warning signs, he began to stare into the space, not responding, whole body shaking, foaming, out of his mouth, postevent confusion last for a few minutes, paramedic was called, he was taken to the emergency room,  MRI of the brain showed no acute abnormality, generalized atrophy, moderate supratentorium small vessel disease.  Laboratory evaluation seen March 2019 showed normal CBC, BMP showed elevated creatinine 1.37, GFR of 51, negative troponin, UA was normal  He is now back to baseline, family denies significant agitations, Mini-Mental Status Examination only 18 out of 30 today   UPDATE Feb 26 2018: He is accompanied by his daughter and son in law at visit. He is overall stable,continue to complains of short term memory loss, slow walking since 2018, sleeps too  much, poor appetite,   Last seizure was on Nov 30th 2019, he was awake, not responsive,  Feb 18 2018,  Sudden onset of disorientation confusion   He is taking Depakote DR 500 mg 2 tablets every night  UPDATE Feb 06 2019:  Last visit was in October 2020, he had a recurrent seizure on December 19, 2018, his Keppra dose was increased to 1000 mg twice a day, Depakote ER 200 mg daily  He continues to have seizure, had 2 recurrent seizure since last visit, the most recent one was on January 30, 2019, body shaking, not responsive  Laboratory evaluations December 24, 2018 showed Depakote level of 63, Keppra level of 38, CMP showed elevated creatinine 1.36, UDS was negative  He is taking Sinemet 25/100 mg 3 times a day, which has helped his walking,  In addition, he was noted to have worsening confusion, auditory hallucinations, scared, agitated sometimes,  He continue has mild gait abnormality, taking Sinemet 25/100 mg 3 times a day, which was helpful    REVIEW OF SYSTEMS: Out of a complete 14 system review of symptoms, the patient complains only of the following symptoms, as above ALLERGIES: Allergies  Allergen Reactions  . Aricept [Donepezil Hcl] Nausea Only  . Penicillins Nausea And Vomiting    Has patient had a PCN reaction causing immediate rash, facial/tongue/throat swelling, SOB or lightheadedness with hypotension: No  Has patient had a PCN reaction causing severe rash involving mucus membranes or skin necrosis: No  Has patient had a PCN reaction that required hospitalization:  No  Has patient had a PCN reaction occurring within the last 10 years: No  If all of the above answers are "NO", then may proceed with Cephalosporin use.    HOME MEDICATIONS: Outpatient Medications Prior to Visit  Medication Sig Dispense Refill  . aspirin 81 MG chewable tablet Chew 1 tablet (81 mg total) by mouth daily. 30 tablet 3  . aspirin EC 81 MG tablet Take 81 mg by mouth daily.    Marland Kitchen atorvastatin  (LIPITOR) 20 MG tablet Take 20 mg by mouth daily.   5  . carbidopa-levodopa (SINEMET IR) 25-100 MG tablet Take 1 tablet by mouth 3 (three) times daily. 270 tablet 4  . divalproex (DEPAKOTE ER) 500 MG 24 hr tablet Take 2 tablets (1,000 mg total) by mouth at bedtime. 180 tablet 4  . levETIRAcetam (KEPPRA) 500 MG tablet Take 2 tablets (1,000 mg total) by mouth 2 (two) times daily. 360 tablet 4  . lisinopril (PRINIVIL,ZESTRIL) 20 MG tablet Take 1 tablet (20 mg total) by mouth daily. 30 tablet 0  . losartan (COZAAR) 25 MG tablet Take 25 mg by mouth daily.    . metoprolol succinate (TOPROL-XL) 25 MG 24 hr tablet Take 25 mg by mouth daily.    . Multiple Vitamins-Minerals (MULTIVITAMIN WITH MINERALS) tablet Take 1 tablet by mouth daily.    . risperiDONE (RISPERDAL) 1 MG tablet Take 1 tablet (1 mg total) by mouth at bedtime. 30 tablet 11   No facility-administered medications prior to visit.     PAST MEDICAL HISTORY: Past Medical History:  Diagnosis Date  . Coronary artery disease   . Dementia ([place])   . Depression   . Headache    "monthly" (02/16/2015)  . Heart disease   . Hypertension   . Memory loss   . Seizures ([place])   . Stroke [facility])    "years ago" (02/16/2015)    PAST SURGICAL HISTORY: Past Surgical History:  Procedure Laterality Date  . CARDIAC CATHETERIZATION N/A 02/16/2015   Procedure: Left Heart Cath and Coronary Angiography;  Surgeon: Charolette Forward, MD;  Location: [place] CV LAB;  Service: Cardiovascular;  Laterality: N/A;  . CARDIAC CATHETERIZATION N/A 02/16/2015   Procedure: Coronary Stent Intervention;  Surgeon: Charolette Forward, MD;  Location: [place] CV LAB;  Service: Cardiovascular;  Laterality: N/A;  . CARDIAC CATHETERIZATION N/A 02/16/2015   Procedure: Intravascular Pressure Wire/FFR Study;  Surgeon: Charolette Forward, MD;  Location: [place] CV LAB;  Service: Cardiovascular;  Laterality: N/A;  . CORONARY ANGIOPLASTY    . LEFT HEART CATH AND CORONARY ANGIOGRAPHY N/A  03/29/2017   Procedure: LEFT HEART CATH AND CORONARY ANGIOGRAPHY;  Surgeon: Charolette Forward, MD;  Location: [place] CV LAB;  Service: Cardiovascular;  Laterality: N/A;  . LEFT HEART CATHETERIZATION WITH CORONARY ANGIOGRAM N/A 03/19/2012   Procedure: LEFT HEART CATHETERIZATION WITH CORONARY ANGIOGRAM;  Surgeon: Clent Demark, MD;  Location: [place] CATH LAB;  Service: Cardiovascular;  Laterality: N/A;  . WISDOM TOOTH EXTRACTION      FAMILY HISTORY: Family History  Problem Relation Age of Onset  . Diabetes Mother   . Hypertension Mother   . Stroke Father   . Cancer Sister     SOCIAL HISTORY: Social History   Socioeconomic History  . Marital status: Divorced    Spouse name: Not on file  . Number of children: 2  . Years of education: 97  . Highest education level: Not on file  Occupational History    Comment: retired Administrator  Social Needs  . Financial resource strain: Not on file  . Food insecurity    Worry: Not on file    Inability: Not on file  . Transportation needs    Medical: Not on file    Non-medical: Not on file  Tobacco Use  . Smoking status: Former Games developer  . Smokeless tobacco: Never Used  . Tobacco comment: quit many years ago  Substance and Sexual Activity  . Alcohol use: Not Currently    Comment: 02/16/2015 "last alcohol was in ~ 2012"  . Drug use: No  . Sexual activity: Not Currently  Lifestyle  . Physical activity    Days per week: Not on file    Minutes per session: Not on file  . Stress: Not on file  Relationships  . Social Musician on phone: Not on file    Gets together: Not on file    Attends religious service: Not on file    Active member of club or organization: Not on file    Attends meetings of clubs or organizations: Not on file    Relationship status: Not on file  . Intimate partner violence    Fear of current or ex partner: Not on file    Emotionally abused: Not on file    Physically abused: Not on file    Forced sexual  activity: Not on file  Other Topics Concern  . Not on file  Social History Narrative   Lives with daughter    caffeine -coffee,  1 cup daily      PHYSICAL EXAM  Vitals:   02/06/19 0930  BP: 131/82  Pulse: 70  Temp: 98 F (36.7 C)  Weight: 162 lb 8 oz (73.7 kg)  Height:  (1.702 m)   Body mass index is 25.45 kg/m.  Generalized: Well developed, masked face, soft voice Cardiology: normal rate and rhythm, no murmur noted Neurological examination  Mentation: Rely on his family to provide history, following three-step command Cranial nerve II-XII: Pupils were equal round reactive to light. Extraocular movements were full, visual field were full on confrontational test. Facial sensation and strength were normal. Uvula tongue midline. Head turning and shoulder shrug  were normal and symmetric. Motor: He has length and nuchal rigidity, right worse than left bradykinesia Sensory: Sensory testing is intact to soft touch on all 4 extremities. No evidence of extinction is noted.  Coordination: Cerebellar testing reveals slow finger-nose-finger and heel-to-shin bilaterally.  Gait and station: He needs push-up to get up from seated position, decreased arm swing, flexed forward, small shuffling, unsteady gait.  DIAGNOSTIC DATA (LABS, IMAGING, TESTING) - I reviewed patient records, labs, notes, testing and imaging myself where available.  MMSE - Mini Mental State Exam 09/12/2017 05/31/2017 05/03/2016  Orientation to time Orientation to Place Registration Attention/ Calculation 5 3 0  Recall 0 0 1  Language- name 2 objects Language- repeat 1 1 0  Language- follow 3 step command Language- follow 3 step command-comments - - did not fold paper  Language- read & follow direction Write a sentence 1 1 0  Copy design 0 0 0  Total score Lab Results  Component Value Date   WBC 6.3 01/18/2019   HGB 13.7 01/18/2019   HCT 42.1  01/18/2019   MCV 91.1 01/18/2019  PLT 210 01/18/2019      Component Value Date/Time   NA 139 01/18/2019 1455   NA 142 12/24/2018 1419   K 4.2 01/18/2019 1455   CL 104 01/18/2019 1455   CO2 25 01/18/2019 1455   GLUCOSE 110 (H) 01/18/2019 1455   BUN 17 01/18/2019 1455   BUN 16 12/24/2018 1419   CREATININE 1.36 (H) 01/18/2019 1455   CALCIUM 9.2 01/18/2019 1455   PROT 7.5 01/18/2019 1455   PROT 6.6 12/24/2018 1419   ALBUMIN 4.2 01/18/2019 1455   ALBUMIN 4.4 12/24/2018 1419   AST 21 01/18/2019 1455   ALT 14 01/18/2019 1455   ALKPHOS 65 01/18/2019 1455   BILITOT 0.7 01/18/2019 1455   BILITOT 0.2 12/24/2018 1419   GFRNONAA 53 (L) 01/18/2019 1455   GFRAA >60 01/18/2019 1455   Lab Results  Component Value Date   CHOL 186 03/18/2012   HDL 72 03/18/2012   LDLCALC 105 (H) 03/18/2012   TRIG 43 03/18/2012   CHOLHDL 2.6 03/18/2012   Lab Results  Component Value Date   HGBA1C 5.6 02/16/2016   Lab Results  Component Value Date   VITAMINB12 402 02/16/2016   Lab Results  Component Value Date   TSH 2.120 12/28/2015     ASSESSMENT AND PLAN  Parkinson's disease Dementia with behavior issues Seizure most recurrent seizure was on December 30, 2018.   While taking Keppra, and Depakote  Keep Depakote ER 500 mg 2 tablets every night  Tapering off Keppra, add on lamotrigine, titrating to 100 mg twice a day  EEG  Levert Feinstein, M.D. Ph.D.  [facility] [address] [place], [place] [address] Phone: [phone] Fax:      [phone]

## 2019-02-10 ENCOUNTER — Telehealth: Payer: Self-pay | Admitting: Neurology

## 2019-02-10 NOTE — Telephone Encounter (Signed)
Pt son in law(on DPR)is asking for a call from RN to discuss the proper dispensing of pt's medications.  Please call

## 2019-02-10 NOTE — Telephone Encounter (Signed)
I spoke to [name] (son-in-law and caregiver on [facility]).  He wanted to review the plan for the patient's Lamictal, Keppra and Depakote ER once more to make sure he had it correct.  We discussed the titration of Lamictal, the tapering of Keppra and continuing Depakote ER.  He verbalized understanding and repeated the instructions back correctly.

## 2019-02-20 ENCOUNTER — Ambulatory Visit (INDEPENDENT_AMBULATORY_CARE_PROVIDER_SITE_OTHER): Payer: Medicare HMO | Admitting: Neurology

## 2019-02-20 ENCOUNTER — Other Ambulatory Visit: Payer: Self-pay

## 2019-02-20 DIAGNOSIS — G20 Parkinson's disease: Secondary | ICD-10-CM

## 2019-02-20 DIAGNOSIS — R569 Unspecified convulsions: Secondary | ICD-10-CM

## 2019-02-20 DIAGNOSIS — F0390 Unspecified dementia without behavioral disturbance: Secondary | ICD-10-CM

## 2019-02-22 ENCOUNTER — Other Ambulatory Visit: Payer: Self-pay | Admitting: Neurology

## 2019-02-25 ENCOUNTER — Emergency Department (HOSPITAL_COMMUNITY)
Admission: EM | Admit: 2019-02-25 | Discharge: 2019-02-25 | Disposition: A | Discharge status: Home / Self Care | Payer: Medicare HMO | Attending: Emergency Medicine | Admitting: Emergency Medicine

## 2019-02-25 ENCOUNTER — Other Ambulatory Visit: Payer: Self-pay

## 2019-02-25 ENCOUNTER — Encounter (HOSPITAL_COMMUNITY): Payer: Self-pay

## 2019-02-25 ENCOUNTER — Emergency Department (HOSPITAL_COMMUNITY): Payer: Medicare HMO

## 2019-02-25 ENCOUNTER — Telehealth: Payer: Self-pay | Admitting: Neurology

## 2019-02-25 DIAGNOSIS — Z8673 Personal history of transient ischemic attack (TIA), and cerebral infarction without residual deficits: Secondary | ICD-10-CM | POA: Insufficient documentation

## 2019-02-25 DIAGNOSIS — Z79899 Other long term (current) drug therapy: Secondary | ICD-10-CM | POA: Insufficient documentation

## 2019-02-25 DIAGNOSIS — F0281 Dementia in other diseases classified elsewhere with behavioral disturbance: Secondary | ICD-10-CM | POA: Insufficient documentation

## 2019-02-25 DIAGNOSIS — G20 Parkinson's disease: Secondary | ICD-10-CM | POA: Insufficient documentation

## 2019-02-25 DIAGNOSIS — Z7982 Long term (current) use of aspirin: Secondary | ICD-10-CM | POA: Diagnosis not present

## 2019-02-25 DIAGNOSIS — R569 Unspecified convulsions: Secondary | ICD-10-CM | POA: Diagnosis not present

## 2019-02-25 DIAGNOSIS — R404 Transient alteration of awareness: Secondary | ICD-10-CM | POA: Diagnosis not present

## 2019-02-25 DIAGNOSIS — I2510 Atherosclerotic heart disease of native coronary artery without angina pectoris: Secondary | ICD-10-CM | POA: Diagnosis not present

## 2019-02-25 DIAGNOSIS — F0391 Unspecified dementia with behavioral disturbance: Secondary | ICD-10-CM | POA: Diagnosis not present

## 2019-02-25 DIAGNOSIS — G40909 Epilepsy, unspecified, not intractable, without status epilepticus: Secondary | ICD-10-CM | POA: Insufficient documentation

## 2019-02-25 DIAGNOSIS — I10 Essential (primary) hypertension: Secondary | ICD-10-CM | POA: Diagnosis not present

## 2019-02-25 DIAGNOSIS — Z87891 Personal history of nicotine dependence: Secondary | ICD-10-CM | POA: Insufficient documentation

## 2019-02-25 DIAGNOSIS — I4510 Unspecified right bundle-branch block: Secondary | ICD-10-CM | POA: Diagnosis not present

## 2019-02-25 LAB — COMPREHENSIVE METABOLIC PANEL
ALT: 8 U/L (ref 0–44)
AST: 18 U/L (ref 15–41)
Albumin: 3.8 g/dL (ref 3.5–5.0)
Alkaline Phosphatase: 63 U/L (ref 38–126)
Anion gap: 11 (ref 5–15)
BUN: 14 mg/dL (ref 8–23)
CO2: 25 mmol/L (ref 22–32)
Calcium: 9.2 mg/dL (ref 8.9–10.3)
Chloride: 103 mmol/L (ref 98–111)
Creatinine, Ser: 1.26 mg/dL — ABNORMAL HIGH (ref 0.61–1.24)
GFR calc Af Amer: >60 mL/min (ref >60)
GFR calc non Af Amer: 58 mL/min — ABNORMAL LOW (ref >60)
Glucose, Bld: 96 mg/dL (ref 70–99)
Potassium: 4.3 mmol/L (ref 3.5–5.1)
Sodium: 139 mmol/L (ref 135–145)
Total Bilirubin: 0.5 mg/dL (ref 0.3–1.2)
Total Protein: 6.9 g/dL (ref 6.5–8.1)

## 2019-02-25 LAB — CBC WITH DIFFERENTIAL/PLATELET
Abs Immature Granulocytes: 0.01 10*3/uL (ref 0.00–0.07)
Basophils Absolute: 0 10*3/uL (ref 0.0–0.1)
Basophils Relative: 0 %
Eosinophils Absolute: 0.1 10*3/uL (ref 0.0–0.5)
Eosinophils Relative: 1 %
HCT: 39 % (ref 39.0–52.0)
Hemoglobin: 12.5 g/dL — ABNORMAL LOW (ref 13.0–17.0)
Immature Granulocytes: 0 %
Lymphocytes Relative: 30 %
Lymphs Abs: 2 10*3/uL (ref 0.7–4.0)
MCH: 29.3 pg (ref 26.0–34.0)
MCHC: 32.1 g/dL (ref 30.0–36.0)
MCV: 91.5 fL (ref 80.0–100.0)
Monocytes Absolute: 0.7 10*3/uL (ref 0.1–1.0)
Monocytes Relative: 11 %
Neutro Abs: 3.9 10*3/uL (ref 1.7–7.7)
Neutrophils Relative %: 58 %
Platelets: 207 10*3/uL (ref 150–400)
RBC: 4.26 MIL/uL (ref 4.22–5.81)
RDW: 14.5 % (ref 11.5–15.5)
WBC: 6.7 10*3/uL (ref 4.0–10.5)
nRBC: 0 % (ref 0.0–0.2)

## 2019-02-25 LAB — LACTIC ACID, PLASMA: Lactic Acid, Venous: 1.2 mmol/L (ref 0.5–1.9)

## 2019-02-25 NOTE — Telephone Encounter (Signed)
Patient son in law Ikel called to advise they believe patient is having a negative response to the lamactil. Patient son in law states he has been shaking, loss of apetite, unable to take his medications, and severe weakness.  Please follow up.

## 2019-02-25 NOTE — Telephone Encounter (Signed)
I spoke to his son-in-law/caregiver, [name].  He has been taking Lamictal 25mg , one tablet BID for the one week.  Since starting the medications, he has developed the symptoms below.  He is also disoriented and has an unsteady gait.  He is mostly having to stay in the bed.  He was scheduled to increase his dose to 50mg  BID today but Mr. Jimmye Norman does not feel he is doing well on this particular medication and does not feel comfortable with the increase.

## 2019-02-25 NOTE — Telephone Encounter (Signed)
Please schedule with Butler Denmark, NP when his caregiver calls back.

## 2019-02-25 NOTE — ED Triage Notes (Signed)
Patient coming from home with c/o witness seizure by ex wife but reported to ems that they were unsure. Patient alert to self only.

## 2019-02-25 NOTE — ED Provider Notes (Signed)
[facility] DEPT Provider Note   CSN: [id number] Arrival date & time: 02/25/19  1126     History   Chief Complaint Chief Complaint  Patient presents with  . Seizures    HPI Tyrone Noble is a 68 y.o. male with past medical history of Parkinson disease, dementia, seizure disorder, hypertension, CAD, to the ED via EMS after seizure that occurred this morning.  Per chart review, patient's son-in-law contacted patient's neurologist Dr. Krista Blue with [facility] neurology and reported seizure episode today that lasted 30 minutes in duration.  He is currently undergoing medication change for his seizure control.  He takes Depakote ER1000 mg nightly and is tapering off of Keppra and titrating Lamictal.  Per chart review, patient's son-in-law had called neurology office this morning reporting a "negative response to Lamictal" with "shaking, loss of appetite, unable to take his medications, and severe weakness." Level 5 caveat secondary to patient's mental status.     The history is provided by a relative, the EMS personnel and medical records. The history is limited by the condition of the patient.    Past Medical History:  Diagnosis Date  . Coronary artery disease   . Dementia ([place])   . Depression   . Headache    "monthly" (02/16/2015)  . Heart disease   . Hypertension   . Memory loss   . Seizures ([place])   . Stroke [facility])    "years ago" (02/16/2015)    Patient Active Problem List   Diagnosis Date Noted  . Parkinsonism ([place]) 02/26/2018  . Therapeutic drug monitoring 09/12/2017  . Seizures ([place]) 05/31/2017  . Dementia without behavioral disturbance ([place]) 05/31/2017  . New-onset angina ([place]) 02/16/2015  . HYPERLIPIDEMIA 12/13/2009  . ALCOHOLISM 07/07/2009  . CALLUS, TOE 01/03/2007  . DEPRESSION, MAJOR, RECURRENT 05/17/2006  . TOBACCO DEPENDENCE 05/17/2006  . HYPERTENSION, BENIGN SYSTEMIC 05/17/2006  . HEMORRHOIDS, NOS 05/17/2006  . GASTROESOPHAGEAL  REFLUX, NO ESOPHAGITIS 05/17/2006  . LEG PAIN OR KNEE PAIN 05/17/2006    Past Surgical History:  Procedure Laterality Date  . CARDIAC CATHETERIZATION N/A 02/16/2015   Procedure: Left Heart Cath and Coronary Angiography;  Surgeon: Charolette Forward, MD;  Location: [facility] CV LAB;  Service: Cardiovascular;  Laterality: N/A;  . CARDIAC CATHETERIZATION N/A 02/16/2015   Procedure: Coronary Stent Intervention;  Surgeon: Charolette Forward, MD;  Location: [facility] CV LAB;  Service: Cardiovascular;  Laterality: N/A;  . CARDIAC CATHETERIZATION N/A 02/16/2015   Procedure: Intravascular Pressure Wire/FFR Study;  Surgeon: Charolette Forward, MD;  Location: [facility] CV LAB;  Service: Cardiovascular;  Laterality: N/A;  . CORONARY ANGIOPLASTY    . LEFT HEART CATH AND CORONARY ANGIOGRAPHY N/A 03/29/2017   Procedure: LEFT HEART CATH AND CORONARY ANGIOGRAPHY;  Surgeon: Charolette Forward, MD;  Location: [facility] CV LAB;  Service: Cardiovascular;  Laterality: N/A;  . LEFT HEART CATHETERIZATION WITH CORONARY ANGIOGRAM N/A 03/19/2012   Procedure: LEFT HEART CATHETERIZATION WITH CORONARY ANGIOGRAM;  Surgeon: Clent Demark, MD;  Location: [facility] CATH LAB;  Service: Cardiovascular;  Laterality: N/A;  . WISDOM TOOTH EXTRACTION          Home Medications    Prior to Admission medications   Medication Sig Start Date End Date Taking? Authorizing Provider  aspirin EC 81 MG tablet Take 81 mg by mouth daily.   Yes [provider]  atorvastatin (LIPITOR) 20 MG tablet Take 20 mg by mouth daily.  05/08/17  Yes [provider]  carbidopa-levodopa (SINEMET IR) 25-100 MG tablet  Take 1 tablet by mouth 3 (three) times daily. 09/12/18  Yes Levert FeinsteinYan, Yijun, MD  divalproex (DEPAKOTE ER) 500 MG 24 hr tablet Take 2 tablets (1,000 mg total) by mouth at bedtime. 12/16/18  Yes Levert FeinsteinYan, Yijun, MD  lamoTRIgine (LAMICTAL) 100 MG tablet Take 1 tablet (100 mg total) by mouth 2 (two) times daily. 02/06/19  Yes Levert FeinsteinYan, Yijun, MD  levETIRAcetam  (KEPPRA) 500 MG tablet Take 2 tablets (1,000 mg total) by mouth 2 (two) times daily. 12/24/18  Yes Lomax, Amy, NP  lisinopril (PRINIVIL,ZESTRIL) 20 MG tablet Take 1 tablet (20 mg total) by mouth daily. 04/21/18  Yes Fawze, Mina A, PA-C  losartan (COZAAR) 25 MG tablet Take 25 mg by mouth daily. 12/09/18  Yes [provider]  metoprolol succinate (TOPROL-XL) 25 MG 24 hr tablet Take 25 mg by mouth daily. 11/19/15  Yes [provider]  risperiDONE (RISPERDAL) 1 MG tablet Take 1 tablet (1 mg total) by mouth at bedtime. 02/26/18  Yes Levert FeinsteinYan, Yijun, MD  aspirin 81 MG chewable tablet Chew 1 tablet (81 mg total) by mouth daily. Patient not taking: Reported on 02/25/2019 02/17/15   Rinaldo CloudHarwani, Mohan, MD  lamoTRIgine (LAMICTAL) 25 MG tablet 1 tab bid x one week 2 tab bid x 2nd week 3 tab bid x 3rd week Patient not taking: Reported on 02/25/2019 02/06/19   Levert FeinsteinYan, Yijun, MD    Family History Family History  Problem Relation Age of Onset  . Diabetes Mother   . Hypertension Mother   . Stroke Father   . Cancer Sister     Social History Social History   Tobacco Use  . Smoking status: Former Games developermoker  . Smokeless tobacco: Never Used  . Tobacco comment: quit many years ago  Substance Use Topics  . Alcohol use: Not Currently    Comment: 02/16/2015 "last alcohol was in ~ 2012"  . Drug use: No     Allergies   Aricept [donepezil hcl] and Penicillins   Review of Systems Review of Systems  Unable to perform ROS: Dementia     Physical Exam Updated Vital Signs BP 139/82 (BP Location: Left Arm)   Pulse 75   Temp 98.4 F (36.9 C) (Rectal)   Resp 16   SpO2 100%   Physical Exam Vitals signs and nursing note reviewed.  Constitutional:      General: He is not in acute distress.    Appearance: He is well-developed.  HENT:     Head: Normocephalic and atraumatic.  Eyes:     Conjunctiva/sclera: Conjunctivae normal.  Cardiovascular:     Rate and Rhythm: Normal rate and regular rhythm.   Pulmonary:     Effort: Pulmonary effort is normal. No respiratory distress.     Breath sounds: Normal breath sounds.  Abdominal:     General: Bowel sounds are normal.     Palpations: Abdomen is soft.     Tenderness: There is no abdominal tenderness. There is no guarding or rebound.  Skin:    General: Skin is warm.  Neurological:     Mental Status: He is alert.     Comments: Patient is able to follow simple commands.  Equal strength to bilateral upper and lower extremities including grip strength, dorsi and plantar flexion.  Cranial nerves grossly intact.  PERRL, EOM normal.  Normal tone.  Psychiatric:        Behavior: Behavior normal.      ED Treatments / Results  Labs (all labs ordered are listed, but only abnormal  results are displayed) Labs Reviewed  COMPREHENSIVE METABOLIC PANEL - Abnormal; Notable for the following components:      Result Value   Creatinine, Ser 1.26 (*)    GFR calc non Af Amer 58 (*)    All other components within normal limits  CBC WITH DIFFERENTIAL/PLATELET - Abnormal; Notable for the following components:   Hemoglobin 12.5 (*)    All other components within normal limits  URINE CULTURE  LACTIC ACID, PLASMA  URINALYSIS, ROUTINE W REFLEX MICROSCOPIC    EKG None  Radiology Dg Chest Port 1 View  Result Date: 02/25/2019 CLINICAL DATA:  Seizure. EXAM: PORTABLE CHEST 1 VIEW COMPARISON:  01/18/2019 and prior radiographs FINDINGS: The cardiomediastinal silhouette is unremarkable. There is no evidence of focal airspace disease, pulmonary edema, suspicious pulmonary nodule/mass, pleural effusion, or pneumothorax. No acute bony abnormalities are identified. IMPRESSION: No active disease. Electronically Signed   By: Harmon Pier M.D.   On: 02/25/2019 15:21    Procedures Procedures (including critical care time)  Medications Ordered in ED Medications - No data to display   Initial Impression / Assessment and Plan / ED Course  I have reviewed the triage  vital signs and the nursing notes.  Pertinent labs & imaging results that were available during my care of the patient were reviewed by me and considered in my medical decision making (see chart for details).  Clinical Course as of Feb 24 1802  Tue Feb 25, 2019  1541 Patient son is now at bedside.  Reports gradually worsening generalized weakness and fatigue since starting the medication change for his seizures.  He states yesterday he had dinner as usual, however could not swallow his evening medications.  He also could not swallow his morning medications.  Persistent generalized weakness and fatigue today.  They called his neurologist who recommended ED evaluation.  He states upon EMS arrival is when patient had a witnessed seizure.  No other focal neuro deficits reported.  Son reports patient is at his mental baseline.   [JR]    Clinical Course User Index [JR] , [place] N, PA-C       Patient with history of dementia, Parkinson disease, seizure disorder, presenting to the ED via EMS.  Once patient's son arrived, it appears EMS was not called for seizure, it was called for his difficulty swallowing that began yesterday evening after eating dinner.  Patient son reports he was unable to swallow his evening or morning medications today.  He has had a gradually worsening generalized weakness after medication change by his neurologist who is aware of this.  He is not having any other focal neuro symptoms.  On exam, there are no obvious focal neuro deficits.  Patient son reports he has is at his mental baseline.  His lab work-up is unremarkable.  Nurse performed a swallow screen and had no difficulty swallowing and was able to drink plenty of water without difficulty. Unlikely stroke.  Patient discussed with Dr. Freida Busman, agrees with work-up at this time.  Patient will be discharged with instruction to follow-up closely with his neurologist regarding medication management and concerns.  Instructed to  return to the ED immediately should any symptoms worsen.  Discussed results, findings, treatment and follow up. Patient/family advised of return precautions. Patient/family verbalized understanding and agreed with plan.  Final Clinical Impressions(s) / ED Diagnoses   Final diagnoses:  Seizure disorder (HCC)  Parkinson disease (HCC)  Dementia with behavioral disturbance, unspecified dementia type [facility])    ED Discharge Orders  None       , [place] N, [place] 02/25/19 1803    Lorre Nick, MD 02/26/19 1330

## 2019-02-25 NOTE — Discharge Instructions (Addendum)
It was a pleasure caring for you today. Your lab work and chest xray are reassuring today.  Your swallow screen is normal.  Continue taking your medications as prescribed. Call your neurologist for close follow-up and discussion regarding your seizure medications and concerns. Return to the emergency department should any new or concerning symptoms arise.

## 2019-02-25 NOTE — Telephone Encounter (Signed)
I called his son in law, [name], patient had recurrent seizure today, lasting for 30 minutes, on his way to ER by ambulance.  I have asked him to call back, give him an earlier follow up, ok with Judson Roch

## 2019-02-26 NOTE — Progress Notes (Signed)
PATIENT: Tyrone Noble DOB: 07/17/1950  REASON FOR VISIT: follow up HISTORY FROM: patient  HISTORY OF PRESENT ILLNESS: Today 02/27/19  HISTORY Tyrone Pigeonheodore F Jeffersis a 68 year old male, seen in refer byhis primary care doctor [name], Dibasfor evaluation of seizure, he is accompanied by his son in law Grayland Ormondkel Williamsat today's clinical visit, he has lived with his daughter's family for5 years.  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 Marjory LiesPenumalli in 2017 for dementia, he did also have a history of alcohol abuse, in remission for many years.  He presented to the emergency room on May 26, 2017 for seizure, he was ready to have breakfast, without warning signs, he began to stare into the space, not responding, whole body shaking, foaming, out of his mouth, postevent confusion last for a few minutes, paramedic was called, he was taken to the emergency room,  MRI of the brain showed no acute abnormality, generalized atrophy, moderate supratentorium small vessel disease.  Laboratory evaluation seen March 2019 showed normal CBC, BMP showed elevated creatinine 1.37, GFR of 51, negative troponin, UA was normal  He is now back to baseline, family denies significant agitations, Mini-Mental Status Examination only 18 out of 30 today   UPDATE Feb 26 2018: He is accompanied by his daughter and son in law at visit. He is overall stable,continue to complains of short term memory loss, slow walking since 2018, sleeps too much, poor appetite,   Last seizure was on Nov 30th 2019, he was awake, not responsive,  Feb 18 2018,  Sudden onset of disorientation confusion   He is taking Depakote DR 500 mg 2 tablets every night   UPDATE Feb 06 2019:  Last visit was in October 2020, he had a recurrent seizure on December 19, 2018, his Keppra dose was increased to 1000 mg twice a day, Depakote ER 200 mg daily  He continues to have seizure, had 2 recurrent seizure since last visit, the most recent one was on January 30, 2019, body shaking, not responsive  Laboratory evaluations December 24, 2018 showed Depakote level of 63, Keppra level of 38, CMP showed elevated creatinine 1.36, UDS was negative  He is taking Sinemet 25/100 mg 3 times a day, which has helped his walking,  In addition, he was noted to have worsening confusion, auditory hallucinations, scared, agitated sometimes,  He continue has mild gait abnormality, taking Sinemet 25/100 mg 3 times a day, which was helpful  Update February 27, 2019 SS: After last visit he was to taper off Keppra add on lamotrigine, continue Depakote.   His son-in-law reports he had 2 weeks of Lamictal, got up to 50 mg twice a day, developed confusion, weakness, couldn't get out of bed, or walk. He stopped the medication on Monday December 7, he called EMS on December 8, for symptoms noted prior, having had a seizure while the paramedics were there. On December 5, 6 he didn't take any medications, because he was so weak he could not swallow.  He was taken to the ER 02/25/2019 for reported seizure lasting 30 minutes.  CBC showed hemoglobin mildly low 12.5, CMP creatinine mildly increased 1.26, lactic acid was normal, chest x-ray unremarkable.  He is now back to baseline, he is able to  walk, he was able to take a shower today.  He is back taking his normal dose of Keppra and Depakote.  The Lamictal has not been resumed.  There have been no illness or signs of infection.  REVIEW OF SYSTEMS: Out of a complete 14 system review of symptoms, the patient complains only of the following symptoms, and all other reviewed systems are negative.  Seizures   ALLERGIES: Allergies  Allergen  Reactions  . Aricept [Donepezil Hcl] Nausea Only  . Penicillins Nausea And Vomiting    Has patient had a PCN reaction causing immediate rash, facial/tongue/throat swelling, SOB or lightheadedness with hypotension: No  Has patient had a PCN reaction causing severe rash involving mucus membranes or skin necrosis: No  Has patient had a PCN reaction that required hospitalization: No  Has patient had a PCN reaction occurring within the last 10 years: No  If all of the above answers are "NO", then may proceed with Cephalosporin use.    HOME MEDICATIONS: Outpatient Medications Prior to Visit  Medication Sig Dispense Refill  . aspirin 81 MG chewable tablet Chew 1 tablet (81 mg total) by mouth daily. 30 tablet 3  . atorvastatin (LIPITOR) 20 MG tablet Take 20 mg by mouth daily.   5  . carbidopa-levodopa (SINEMET IR) 25-100 MG tablet Take 1 tablet by mouth 3 (three) times daily. 270 tablet 4  . divalproex (DEPAKOTE ER) 500 MG 24 hr tablet Take 2 tablets (1,000 mg total) by mouth at bedtime. 180 tablet 4  . levETIRAcetam (KEPPRA) 500 MG tablet Take 2 tablets (1,000 mg total) by mouth 2 (two) times daily. 360 tablet 4  . lisinopril (PRINIVIL,ZESTRIL) 20 MG tablet Take 1 tablet (20 mg total) by mouth daily. 30 tablet 0  . losartan (COZAAR) 25 MG tablet Take 25 mg by mouth daily.    . metoprolol succinate (TOPROL-XL) 25 MG 24 hr tablet Take 25 mg by mouth daily.    . risperiDONE (RISPERDAL) 1 MG tablet Take 1 tablet (1 mg total) by mouth at bedtime. 30 tablet 11  . lamoTRIgine (LAMICTAL) 100 MG tablet Take 1 tablet (100 mg total) by mouth 2 (two) times daily. (Patient not taking: Reported on 02/27/2019) 60 tablet 11  . lamoTRIgine (LAMICTAL) 25 MG tablet 1 tab bid x one week 2 tab bid x 2nd week 3 tab bid x 3rd week (Patient not taking: Reported on 02/25/2019) 84 tablet 0  . aspirin EC 81 MG tablet Take 81 mg by mouth daily.     No facility-administered medications prior to visit.    PAST MEDICAL  HISTORY: Past Medical History:  Diagnosis Date  . Coronary artery disease   . Dementia ([place])   . Depression   . Headache    "monthly" (02/16/2015)  . Heart disease   . Hypertension   . Memory loss   . Seizures ([place])   . Stroke [facility])    "years ago" (02/16/2015)    PAST SURGICAL HISTORY: Past Surgical History:  Procedure Laterality Date  . CARDIAC CATHETERIZATION N/A 02/16/2015   Procedure: Left Heart Cath and Coronary Angiography;  Surgeon: Charolette Forward, MD;  Location: [facility];  Service: Cardiovascular;  Laterality: N/A;  . CARDIAC CATHETERIZATION N/A 02/16/2015   Procedure: Coronary Stent Intervention;  Surgeon: Charolette Forward, MD;  Location: [facility];  Service: Cardiovascular;  Laterality: N/A;  . CARDIAC CATHETERIZATION N/A 02/16/2015   Procedure: Intravascular Pressure Wire/FFR Study;  Surgeon: Charolette Forward, MD;  Location: [facility];  Service: Cardiovascular;  Laterality: N/A;  . CORONARY ANGIOPLASTY    . LEFT HEART CATH AND CORONARY ANGIOGRAPHY N/A 03/29/2017   Procedure: LEFT HEART CATH AND CORONARY ANGIOGRAPHY;  Surgeon: Rinaldo Cloud, MD;  Location: [facility];  Service: Cardiovascular;  Laterality: N/A;  . LEFT HEART CATHETERIZATION WITH CORONARY ANGIOGRAM N/A 03/19/2012   Procedure: LEFT HEART CATHETERIZATION WITH CORONARY ANGIOGRAM;  Surgeon: Robynn Pane, MD;  Location: [facility];  Service: Cardiovascular;  Laterality: N/A;  . WISDOM TOOTH EXTRACTION      FAMILY HISTORY: Family History  Problem Relation Age of Onset  . Diabetes Mother   . Hypertension Mother   . Stroke Father   . Cancer Sister     SOCIAL HISTORY: Social History   Socioeconomic History  . Marital status: Divorced    Spouse name: Not on file  . Number of children: 2  . Years of education: 76  . Highest education level: Not on file  Occupational History    Comment: retired Naval architect  Tobacco Use  . Smoking status: Former Games developer  . Smokeless  tobacco: Never Used  . Tobacco comment: quit many years ago  Substance and Sexual Activity  . Alcohol use: Not Currently    Comment: 02/16/2015 "last alcohol was in ~ 2012"  . Drug use: No  . Sexual activity: Not Currently  Other Topics Concern  . Not on file  Social History Narrative   Lives with daughter    caffeine -coffee,  1 cup daily   Social Determinants of Health   Financial Resource Strain:   . Difficulty of Paying Living Expenses: Not on file  Food Insecurity:   . Worried About Programme researcher, broadcasting/film/video in the Last Year: Not on file  . Ran Out of Food in the Last Year: Not on file  Transportation Needs:   . Lack of Transportation (Medical): Not on file  . Lack of Transportation (Non-Medical): Not on file  Physical Activity:   . Days of Exercise per Week: Not on file  . Minutes of Exercise per Session: Not on file  Stress:   . Feeling of Stress : Not on file  Social Connections:   . Frequency of Communication with Friends and Family: Not on file  . Frequency of Social Gatherings with Friends and Family: Not on file  . Attends Religious Services: Not on file  . Active Member of Clubs or Organizations: Not on file  . Attends Banker Meetings: Not on file  . Marital Status: Not on file  Intimate Partner Violence:   . Fear of Current or Ex-Partner: Not on file  . Emotionally Abused: Not on file  . Physically Abused: Not on file  . Sexually Abused: Not on file   PHYSICAL EXAM  Vitals:   02/27/19 1242  BP: 113/76  Pulse: 82  Temp: (!) 97.4 F (36.3 C)  Weight: 157 lb (71.2 kg)  Height:  (1.676 m)   Body mass index is 25.34 kg/m.  Generalized: Well developed, in no acute distress, masking of the face, soft voice Neurological examination  Mentation: Alert, history is provided by his son-in-law.  Follows exam commands. Cranial nerve II-XII: Pupils were equal round reactive to light. Extraocular movements were full, visual field were full on  confrontational test. Facial sensation and strength were normal.  Head turning and shoulder shrug were normal and symmetric. Motor: Moderate strength in all extremities Sensory: Sensory testing is intact to soft touch  on all 4 extremities. No evidence of extinction is noted.  Coordination: Cerebellar testing reveals good finger-nose-finger and heel-to-shin bilaterally, but is slow. Gait and station: Forward leaning gait, small steps, shuffling Reflexes: Deep tendon reflexes are symmetric and normal bilaterally.   DIAGNOSTIC DATA (LABS, IMAGING, TESTING) - I reviewed patient records, labs, notes, testing and imaging myself where available.  Lab Results  Component Value Date   WBC 6.7 02/25/2019   HGB 12.5 (L) 02/25/2019   HCT 39.0 02/25/2019   MCV 91.5 02/25/2019   PLT 207 02/25/2019      Component Value Date/Time   NA 139 02/25/2019 1253   NA 142 12/24/2018 1419   K 4.3 02/25/2019 1253   CL 103 02/25/2019 1253   CO2 25 02/25/2019 1253   GLUCOSE 96 02/25/2019 1253   BUN 14 02/25/2019 1253   BUN 16 12/24/2018 1419   CREATININE 1.26 (H) 02/25/2019 1253   CALCIUM 9.2 02/25/2019 1253   PROT 6.9 02/25/2019 1253   PROT 6.6 12/24/2018 1419   ALBUMIN 3.8 02/25/2019 1253   ALBUMIN 4.4 12/24/2018 1419   AST 18 02/25/2019 1253   ALT 8 02/25/2019 1253   ALKPHOS 63 02/25/2019 1253   BILITOT 0.5 02/25/2019 1253   BILITOT 0.2 12/24/2018 1419   GFRNONAA 58 (L) 02/25/2019 1253   GFRAA >60 02/25/2019 1253   Lab Results  Component Value Date   CHOL 186 03/18/2012   HDL 72 03/18/2012   LDLCALC 105 (H) 03/18/2012   TRIG 43 03/18/2012   CHOLHDL 2.6 03/18/2012   Lab Results  Component Value Date   HGBA1C 5.6 02/16/2016   Lab Results  Component Value Date   VITAMINB12 402 02/16/2016   Lab Results  Component Value Date   TSH 2.120 12/28/2015      ASSESSMENT AND PLAN 68 y.o. year old male  has a past medical history of Coronary artery disease, Dementia (HCC), Depression,  Headache, Heart disease, Hypertension, Memory loss, Seizures (HCC), and Stroke (HCC). here with:  1.  Parkinson's disease 2.  Dementia with behavior issues 3.  Seizure, recurrent seizure February 25, 2019 (had 3 days without seizure medication due to weakness) -Unable to tolerate Lamictal due to side effect of significant weakness, loss of appetite, he got up to 50 mg twice daily and then developed side effect  -Continue Keppra 500 mg tablet, 2 tablets twice a day -Continue Depakote ER 500 mg tablet, 2 tablets at bedtime -EEG completed, report is pending -Laboratory evaluation 03/07/2019 CBC, CMP essentially unremarkable, lactic acid normal -Follow-up in 4 months or sooner if needed  I spent 15 minutes with the patient. 50% of this time was spent discussing his plan of care.    Margie Ege, AGNP-C, DNP 02/27/2019, 1:03 PM [facility] [address], [address] [place], [place] [address] [phone]

## 2019-02-27 ENCOUNTER — Other Ambulatory Visit: Payer: Self-pay

## 2019-02-27 ENCOUNTER — Encounter: Payer: Self-pay | Admitting: Neurology

## 2019-02-27 ENCOUNTER — Ambulatory Visit (INDEPENDENT_AMBULATORY_CARE_PROVIDER_SITE_OTHER): Payer: Medicare HMO | Admitting: Neurology

## 2019-02-27 VITALS — BP 113/76 | HR 82 | Temp 97.4°F | Ht 66.0 in | Wt 157.0 lb

## 2019-02-27 DIAGNOSIS — R569 Unspecified convulsions: Secondary | ICD-10-CM | POA: Diagnosis not present

## 2019-02-27 NOTE — Procedures (Signed)
   HISTORY: 68 year old male, with history of seizure  TECHNIQUE:  This is a routine 16 channel EEG recording with one channel devoted to a limited EKG recording.  It was performed during wakefulness, drowsiness and asleep.  Hyperventilation and photic stimulation were performed as activating procedures.  There are minimum muscle and movement artifact noted.  Upon maximum arousal, posterior dominant waking rhythm consistent of moderate slowing of background activity, dysrhythmic theta range, 6 Hz, with higher amplitude slower bilateral frontal activity.. Activities are symmetric over the bilateral posterior derivations and attenuated with eye opening.  Hyperventilation produced mild/moderate buildup with higher amplitude and the slower activities noted.  Photic stimulation did not alter the tracing.  During EEG recording, patient developed drowsiness and no deeper stage of sleep was achieved During EEG recording, there was no epileptiform discharge noted.  EKG demonstrate sinus rhythm, with heart rate of 78 bpm  CONCLUSION: This is an abnormal awake EEG.  There is electrodiagnostic evidence of moderate background slowing, consistent with moderate bihemisphere malfunction, there is no evidence of epileptiform discharge.,  Common etiologies are metabolic and toxic.  Marcial Pacas, M.D. Ph.D.  [facility] [place], [place] [address] Phone: [phone] Fax:      [phone]

## 2019-02-27 NOTE — Patient Instructions (Addendum)
For now, continue Depakote, and Keppra, hold the Lamictal   EEG report is pending   Return in 4 months with Dr. Krista Blue

## 2019-03-05 NOTE — Progress Notes (Signed)
I have reviewed and agreed above plan. 

## 2019-04-17 ENCOUNTER — Telehealth: Payer: Self-pay | Admitting: Neurology

## 2019-04-17 MED ORDER — LEVETIRACETAM 500 MG PO TABS: 1000.0000 mg | ORAL_TABLET | Freq: Two times a day (BID) | ORAL | 1 refills | Status: DC

## 2019-04-17 NOTE — Telephone Encounter (Signed)
Patients daughter Lamar Laundry called in and stated the pharmacy sent the Lamictal but not the Keppra. As of 02/25/2019 patient has stopped the Lamictal. She states a RX for Keppra needs to be sent to [id number] - [place], [place] - [address]

## 2019-04-17 NOTE — Telephone Encounter (Signed)
I called daughter relayed that the sent prescription to [facility].  He has appt in 05-08-19.  She confirmed.

## 2019-05-08 ENCOUNTER — Ambulatory Visit: Payer: Medicare HMO | Admitting: Neurology

## 2019-05-22 DIAGNOSIS — I25118 Atherosclerotic heart disease of native coronary artery with other forms of angina pectoris: Secondary | ICD-10-CM | POA: Diagnosis not present

## 2019-05-22 DIAGNOSIS — G40909 Epilepsy, unspecified, not intractable, without status epilepticus: Secondary | ICD-10-CM | POA: Diagnosis not present

## 2019-05-22 DIAGNOSIS — I10 Essential (primary) hypertension: Secondary | ICD-10-CM | POA: Diagnosis not present

## 2019-05-22 DIAGNOSIS — F341 Dysthymic disorder: Secondary | ICD-10-CM | POA: Diagnosis not present

## 2019-05-22 DIAGNOSIS — E785 Hyperlipidemia, unspecified: Secondary | ICD-10-CM | POA: Diagnosis not present

## 2019-05-22 DIAGNOSIS — F17290 Nicotine dependence, other tobacco product, uncomplicated: Secondary | ICD-10-CM | POA: Diagnosis not present

## 2019-05-24 ENCOUNTER — Ambulatory Visit: Payer: Medicare HMO | Attending: Internal Medicine

## 2019-05-24 DIAGNOSIS — Z23 Encounter for immunization: Secondary | ICD-10-CM | POA: Insufficient documentation

## 2019-05-24 NOTE — Progress Notes (Signed)
   Covid-19 Vaccination Clinic  Name:  Tyrone Noble    MRN: [id number] DOB: [birth year]  05/24/2019  [name] was observed post Covid-19 immunization for 15 minutes without incident. He was provided with Vaccine Information Sheet and instruction to access the V-Safe system.   [name] was instructed to call 911 with any severe reactions post vaccine: Marland Kitchen Difficulty breathing  . Swelling of face and throat  . A fast heartbeat  . A bad rash all over body  . Dizziness and weakness   Immunizations Administered    Name Date Dose VIS Date Route   Pfizer COVID-19 Vaccine 05/24/2019 12:09 PM 0.3 mL 02/28/2019 Intramuscular   Manufacturer: [employer], [employer]   Lot: [id number]   NDC: [address]-4

## 2019-06-10 ENCOUNTER — Telehealth: Payer: Self-pay | Admitting: Neurology

## 2019-06-10 MED ORDER — DIVALPROEX SODIUM ER 500 MG PO TB24: 1000.0000 mg | ORAL_TABLET | Freq: Every day | ORAL | 0 refills | Status: DC

## 2019-06-10 NOTE — Telephone Encounter (Signed)
1) Medication(s) Requested (by name): divalproex (DEPAKOTE ER) 500 MG 24 hr tablet   2) Pharmacy of Choice:    3) Special Requests:   Advised pharmacy pt needs to schedule apt before refill request can be sent. They will be calling pt to advise

## 2019-06-10 NOTE — Telephone Encounter (Addendum)
His last appt was canceled due to weather. He has been rescheduled for 07/29/19. Refill sent to [facility] mail order pharmacy, per patient's caregiver request [facility] on [place]).

## 2019-06-14 ENCOUNTER — Ambulatory Visit: Payer: Medicare HMO | Attending: Internal Medicine

## 2019-06-14 DIAGNOSIS — Z23 Encounter for immunization: Secondary | ICD-10-CM

## 2019-06-14 NOTE — Progress Notes (Signed)
   Covid-19 Vaccination Clinic  Name:  Tyrone Noble    MRN: [id number] DOB: May 20, 1950  06/14/2019  [name] was observed post Covid-19 immunization for 15 minutes without incident. He was provided with Vaccine Information Sheet and instruction to access the V-Safe system.   [name] was instructed to call 911 with any severe reactions post vaccine: Marland Kitchen Difficulty breathing  . Swelling of face and throat  . A fast heartbeat  . A bad rash all over body  . Dizziness and weakness   Immunizations Administered    Name Date Dose VIS Date Route   Pfizer COVID-19 Vaccine 06/14/2019 12:25 PM 0.3 mL 02/28/2019 Intramuscular   Manufacturer: [employer], [employer]   Lot: [id number]   NDC: [address]-0

## 2019-07-21 DIAGNOSIS — Z20828 Contact with and (suspected) exposure to other viral communicable diseases: Secondary | ICD-10-CM | POA: Diagnosis not present

## 2019-07-21 DIAGNOSIS — Z9189 Other specified personal risk factors, not elsewhere classified: Secondary | ICD-10-CM | POA: Diagnosis not present

## 2019-07-22 ENCOUNTER — Telehealth: Payer: Self-pay | Admitting: *Deleted

## 2019-07-22 NOTE — Telephone Encounter (Signed)
Received form from [facility], orders for sinemet to be given at lunch when pt at facility.  Order signed and fax confirmation received [phone], [phone].  Email sent to wife as well that sent.

## 2019-07-29 ENCOUNTER — Encounter: Payer: Self-pay | Admitting: Neurology

## 2019-07-29 ENCOUNTER — Other Ambulatory Visit: Payer: Self-pay

## 2019-07-29 ENCOUNTER — Ambulatory Visit: Payer: Medicare HMO | Admitting: Neurology

## 2019-07-29 VITALS — BP 163/90 | HR 98 | Temp 97.0°F | Ht 67.0 in | Wt 165.0 lb

## 2019-07-29 DIAGNOSIS — F0390 Unspecified dementia without behavioral disturbance: Secondary | ICD-10-CM

## 2019-07-29 DIAGNOSIS — R569 Unspecified convulsions: Secondary | ICD-10-CM | POA: Diagnosis not present

## 2019-07-29 DIAGNOSIS — G20 Parkinson's disease: Secondary | ICD-10-CM

## 2019-07-29 DIAGNOSIS — G20C Parkinsonism, unspecified: Secondary | ICD-10-CM

## 2019-07-29 MED ORDER — CARBIDOPA-LEVODOPA 25-100 MG PO TABS: 1.0000 | ORAL_TABLET | Freq: Three times a day (TID) | ORAL | 4 refills | Status: DC

## 2019-07-29 MED ORDER — DIVALPROEX SODIUM ER 500 MG PO TB24: 1000.0000 mg | ORAL_TABLET | Freq: Every day | ORAL | 3 refills | Status: DC

## 2019-07-29 MED ORDER — LEVETIRACETAM 500 MG PO TABS: 1000.0000 mg | ORAL_TABLET | Freq: Two times a day (BID) | ORAL | 3 refills | Status: DC

## 2019-07-29 NOTE — Progress Notes (Signed)
PATIENT: Tyrone Noble DOB: 1950/06/04  REASON FOR VISIT: follow up HISTORY FROM: patient  HISTORY OF PRESENT ILLNESS: Today 07/29/19  HISTORY  Tyrone Noble a 69 year old male, seen in refer byhis primary care doctor [name], [place] evaluation of seizure, he is accompanied by his son in law Grayland Ormond today's clinical visit, he has lived with his daughter's family for5 years.  He had a past medical history of hypertension, hyperlipidemia, coronary artery disease, is a retired [employer] architect, around [age], he developed gradual onset memory loss, was forced to retire early due to gradual worsening memory loss, also had a past medical history of depression anxiety, was seen by my colleague Dr. Marjory Lies in 2017 for dementia, he did also have a history of alcohol abuse, in remission for many years.  He presented to the emergency room on May 26, 2017 for seizure, he was ready to have breakfast, without warning signs, he began to stare into the space, not responding, whole body shaking, foaming, out of his mouth, postevent confusion last for a few minutes, paramedic was called, he was taken to the emergency room,  MRI of the brain showed no acute abnormality, generalized atrophy, moderate supratentorium small vessel disease.  Laboratory evaluation seen March 2019 showed normal CBC, BMP showed elevated creatinine 1.37, GFR of 51, negative troponin, UA was normal  He is now back to baseline, family denies significant agitations, Mini-Mental Status Examination only 18 out of 30 today   UPDATE Feb 26 2018: He is accompanied by his daughter and son in law at visit. He is overall stable,continue to complains of short term memory loss, slow walking since 2018, sleeps too much, poor appetite,   Last seizure was on Nov 30th 2019, he was awake, not responsive,  Feb 18 2018, Sudden onset of disorientation confusion  He is taking Depakote DR 500 mg 2 tablets every  night  UPDATE Feb 06 2019:  Last visit was in October 2020, he had a recurrent seizure on December 19, 2018, his Keppra dose was increased to 1000 mg twice a day, Depakote ER 200 mg daily  He continues to have seizure, had 2 recurrent seizure since last visit, the most recent one was on January 30, 2019, body shaking, not responsive  Laboratory evaluations December 24, 2018 showed Depakote level of 63, Keppra level of 38, CMP showed elevated creatinine 1.36, UDS was negative  He is taking Sinemet 25/100 mg 3 times a day, which has helped his walking,  In addition, he was noted to have worsening confusion, auditory hallucinations, scared, agitated sometimes,  He continue has mild gait abnormality, taking Sinemet 25/100 mg 3 times a day, which was helpful  Update February 27, 2019 SS: After last visit he was to taper off Keppra add on lamotrigine, continue Depakote.   His son-in-law reports he had 2 weeks of Lamictal, got up to 50 mg twice a day, developed confusion, weakness, couldn't get out of bed, or walk. He stopped the medication on Monday December 7, he called EMS on December 8, for symptoms noted prior, having had a seizure while the paramedics were there. On December 5, 6 he didn't take any medications, because he was so weak he could not swallow.  He was taken to the ER 02/25/2019 for reported seizure lasting 30 minutes.  CBC showed hemoglobin mildly low 12.5, CMP creatinine mildly increased 1.26, lactic acid was normal, chest x-ray unremarkable.  He is now back to baseline, he is able to walk,  he was able to take a shower today.  He is back taking his normal dose of Keppra and Depakote.  The Lamictal has not been resumed.  There have been no illness or signs of infection.  Update Jul 29, 2019 SS: Here today for follow-up accompanied by his son-in-law.  He has been doing quite well since last seen.  He has not had recurrent seizure.  No issues with agitation and hallucinations,  he does seem to bring up earlier times, but is pleasant. He is going to [facility], started yesterday first day, going 5 days, 7:30-5:30.  Doing well on Sinemet, Depakote, and Keppra.  His PCP is filling Risperdal.  He sleeps well, no falls.  Overall, doing much better. No recent illness or new problems have come up.   REVIEW OF SYSTEMS: Out of a complete 14 system review of symptoms, the patient complains only of the following symptoms, and all other reviewed systems are negative.  Seizures, memory loss  ALLERGIES: Allergies  Allergen Reactions  . Aricept [Donepezil Hcl] Nausea Only  . Penicillins Nausea And Vomiting    Has patient had a PCN reaction causing immediate rash, facial/tongue/throat swelling, SOB or lightheadedness with hypotension: No  Has patient had a PCN reaction causing severe rash involving mucus membranes or skin necrosis: No  Has patient had a PCN reaction that required hospitalization: No  Has patient had a PCN reaction occurring within the last 10 years: No  If all of the above answers are "NO", then may proceed with Cephalosporin use.    HOME MEDICATIONS: Outpatient Medications Prior to Visit  Medication Sig Dispense Refill  . aspirin 81 MG chewable tablet Chew 1 tablet (81 mg total) by mouth daily. 30 tablet 3  . atorvastatin (LIPITOR) 20 MG tablet Take 20 mg by mouth daily.   5  . carbidopa-levodopa (SINEMET IR) 25-100 MG tablet Take 1 tablet by mouth 3 (three) times daily. 270 tablet 4  . divalproex (DEPAKOTE ER) 500 MG 24 hr tablet Take 2 tablets (1,000 mg total) by mouth at bedtime. 180 tablet 0  . levETIRAcetam (KEPPRA) 500 MG tablet Take 2 tablets (1,000 mg total) by mouth 2 (two) times daily. 360 tablet 1  . lisinopril (PRINIVIL,ZESTRIL) 20 MG tablet Take 1 tablet (20 mg total) by mouth daily. 30 tablet 0  . losartan (COZAAR) 25 MG tablet Take 25 mg by mouth daily.    . metoprolol succinate (TOPROL-XL) 25 MG 24 hr tablet Take 25  mg by mouth daily.    . risperiDONE (RISPERDAL) 1 MG tablet Take 1 tablet (1 mg total) by mouth at bedtime. 30 tablet 11   No facility-administered medications prior to visit.    PAST MEDICAL HISTORY: Past Medical History:  Diagnosis Date  . Coronary artery disease   . Dementia ([place])   . Depression   . Headache    "monthly" (02/16/2015)  . Heart disease   . Hypertension   . Memory loss   . Seizures ([place])   . Stroke [facility])    "years ago" (02/16/2015)    PAST SURGICAL HISTORY: Past Surgical History:  Procedure Laterality Date  . CARDIAC CATHETERIZATION N/A 02/16/2015   Procedure: Left Heart Cath and Coronary Angiography;  Surgeon: Charolette Forward, MD;  Location: [place] [facility];  Service: Cardiovascular;  Laterality: N/A;  . CARDIAC CATHETERIZATION N/A 02/16/2015   Procedure: Coronary Stent Intervention;  Surgeon: Charolette Forward, MD;  Location: [place] [facility];  Service: Cardiovascular;  Laterality:  N/A;  . CARDIAC CATHETERIZATION N/A 02/16/2015   Procedure: Intravascular Pressure Wire/FFR Study;  Surgeon: Rinaldo CloudMohan Harwani, MD;  [facility];  Service: Cardiovascular;  Laterality: N/A;  . CORONARY ANGIOPLASTY    . LEFT HEART CATH AND CORONARY ANGIOGRAPHY N/A 03/29/2017   Procedure: LEFT HEART CATH AND CORONARY ANGIOGRAPHY;  Surgeon: Rinaldo CloudHarwani, Mohan, MD;  Location: [facility] INVASIVE CV [facility];  Service: Cardiovascular;  Laterality: N/A;  . LEFT HEART CATHETERIZATION WITH CORONARY ANGIOGRAM N/A 03/19/2012   Procedure: LEFT HEART CATHETERIZATION WITH CORONARY ANGIOGRAM;  Surgeon: Robynn PaneMohan N Harwani, MD;  Location: [facility] CATH LAB;  Service: Cardiovascular;  Laterality: N/A;  . WISDOM TOOTH EXTRACTION      FAMILY HISTORY: Family History  Problem Relation Age of Onset  . Diabetes Mother   . Hypertension Mother   . Stroke Father   . Cancer Sister     SOCIAL HISTORY: Social History   Socioeconomic History  . Marital status: Divorced    Spouse name: Not on file  . Number of  children: 2  . Years of education: 1412  . Highest education level: Not on file  Occupational History    Comment: retired Naval architecttruck driver  Tobacco Use  . Smoking status: Former Games developermoker  . Smokeless tobacco: Never Used  . Tobacco comment: quit many years ago  Substance and Sexual Activity  . Alcohol use: Not Currently    Comment: 02/16/2015 "last alcohol was in ~ 2012"  . Drug use: No  . Sexual activity: Not Currently  Other Topics Concern  . Not on file  Social History Narrative   Lives with daughter    caffeine -coffee,  1 cup daily   Social Determinants of Health   Financial Resource Strain:   . Difficulty of Paying Living Expenses:   Food Insecurity:   . Worried About Programme researcher, broadcasting/film/videounning Out of Food in the Last Year:   . Baristaan Out of Food in the Last Year:   Transportation Needs:   . Freight forwarderLack of Transportation (Medical):   Marland Kitchen. Lack of Transportation (Non-Medical):   Physical Activity:   . Days of Exercise per Week:   . Minutes of Exercise per Session:   Stress:   . Feeling of Stress :   Social Connections:   . Frequency of Communication with Friends and Family:   . Frequency of Social Gatherings with Friends and Family:   . Attends Religious Services:   . Active Member of Clubs or Organizations:   . Attends BankerClub or Organization Meetings:   Marland Kitchen. Marital Status:   Intimate Partner Violence:   . Fear of Current or Ex-Partner:   . Emotionally Abused:   Marland Kitchen. Physically Abused:   . Sexually Abused:    PHYSICAL EXAM  Vitals:   07/29/19 0758  BP: (!) 163/90  Pulse: 98  Temp: (!) 97 F (36.1 C)  Weight: 165 lb (74.8 kg)  Height: 5\' 7"  (1.702 m)   Body mass index is 25.84 kg/m.  Generalized: Well developed, in no acute distress  MMSE - Mini Mental State Exam 02/27/2019 09/12/2017 05/31/2017  Orientation to time 0 1 1  Orientation to Place 3 4 3   Registration 3 3 3   Attention/ Calculation 0 5 3  Recall 0 0 0  Language- name 2 objects 2 2 2   Language- repeat 1 1 1   Language- follow 3 step  command 3 3 3   Language- follow 3 step command-comments - - -  Language- read & follow direction 1 1 1   Write  a sentence 0 1 1  Copy design 0 0 0  Total score 13 21 18     Neurological examination  Mentation: Alert, participatory, pleasant, most history is provided by his family member. Follows all commands speech and language fluent Cranial nerve II-XII: Pupils were equal round reactive to light. Extraocular movements were full, visual field were full on confrontational test. Facial sensation and strength were normal. Head turning and shoulder shrug  were normal and symmetric. Motor: Good strength in all extremities, mild resting tremor noted bilaterally, bradykinesia noted to right hand with increased rigidity Sensory: Sensory testing is intact to soft touch on all 4 extremities. No evidence of extinction is noted.  Coordination: Cerebellar testing reveals good finger-nose-finger and heel-to-shin bilaterally (mild difficulty performing lower extremities) Gait and station: Has to push off from seated position to stand, gait is forward leaning, slight shuffling, good pace, decreased arm swing, left, no assistive device Reflexes: Deep tendon reflexes are symmetric and normal bilaterally.   DIAGNOSTIC DATA (LABS, IMAGING, TESTING) - I reviewed patient records, labs, notes, testing and imaging myself where available.  Lab Results  Component Value Date   WBC 6.7 02/25/2019   HGB 12.5 (L) 02/25/2019   HCT 39.0 02/25/2019   MCV 91.5 02/25/2019   PLT 207 02/25/2019      Component Value Date/Time   NA 139 02/25/2019 1253   NA 142 12/24/2018 1419   K 4.3 02/25/2019 1253   CL 103 02/25/2019 1253   CO2 25 02/25/2019 1253   GLUCOSE 96 02/25/2019 1253   BUN 14 02/25/2019 1253   BUN 16 12/24/2018 1419   CREATININE 1.26 (H) 02/25/2019 1253   CALCIUM 9.2 02/25/2019 1253   PROT 6.9 02/25/2019 1253   PROT 6.6 12/24/2018 1419   ALBUMIN 3.8 02/25/2019 1253   ALBUMIN 4.4 12/24/2018 1419   AST  18 02/25/2019 1253   ALT 8 02/25/2019 1253   ALKPHOS 63 02/25/2019 1253   BILITOT 0.5 02/25/2019 1253   BILITOT 0.2 12/24/2018 1419   GFRNONAA 58 (L) 02/25/2019 1253   GFRAA >60 02/25/2019 1253   Lab Results  Component Value Date   CHOL 186 03/18/2012   HDL 72 03/18/2012   LDLCALC 105 (H) 03/18/2012   TRIG 43 03/18/2012   CHOLHDL 2.6 03/18/2012   Lab Results  Component Value Date   HGBA1C 5.6 02/16/2016   Lab Results  Component Value Date   VITAMINB12 402 02/16/2016   Lab Results  Component Value Date   TSH 2.120 12/28/2015      ASSESSMENT AND PLAN 69 y.o. year old male  has a past medical history of Coronary artery disease, Dementia (HCC), Depression, Headache, Heart disease, Hypertension, Memory loss, Seizures (HCC), and Stroke (HCC). here with:  1.  Parkinson's disease 2.  Dementia with behavioral issues 3.  Seizure (most recent February 25, 2019) -Overall, doing well, mood has been good, no recurrent seizure now going to [facility] 5 days a week -Continue Keppra 500 mg tablet, 2 tablets twice a day -Continue Depakote ER 500 mg, 2 tablets at bedtime -Continue Sinemet 25/100 mg, 1 tablet 3 times a day -Is also on Risperdal coming from PCP -Could not tolerate Lamictal due to significant side effect of weakness, loss of appetite -Call for recurrent seizure, otherwise follow-up in 6 months or sooner if needed  I spent 30 minutes of face-to-face and non-face-to-face time with patient.  This included previsit chart review, lab review, study review, order entry, electronic health record documentation,  patient education.   Margie Ege, AGNP-C, DNP 07/29/2019, 8:17 AM [facility] [address] [place], [place] [address] [phone]

## 2019-07-29 NOTE — Patient Instructions (Signed)
It was great to see you today Continue current medications  Call for problems or concerns See you back in 6 months

## 2019-07-29 NOTE — Progress Notes (Signed)
I have reviewed and agreed above plan. 

## 2019-09-05 DIAGNOSIS — I25118 Atherosclerotic heart disease of native coronary artery with other forms of angina pectoris: Secondary | ICD-10-CM | POA: Diagnosis not present

## 2019-09-05 DIAGNOSIS — F17290 Nicotine dependence, other tobacco product, uncomplicated: Secondary | ICD-10-CM | POA: Diagnosis not present

## 2019-09-05 DIAGNOSIS — E785 Hyperlipidemia, unspecified: Secondary | ICD-10-CM | POA: Diagnosis not present

## 2019-09-05 DIAGNOSIS — I10 Essential (primary) hypertension: Secondary | ICD-10-CM | POA: Diagnosis not present

## 2019-09-18 ENCOUNTER — Emergency Department (HOSPITAL_COMMUNITY)
Admission: EM | Admit: 2019-09-18 | Discharge: 2019-09-18 | Disposition: A | Discharge status: Home / Self Care | Payer: Medicare HMO | Attending: Emergency Medicine | Admitting: Emergency Medicine

## 2019-09-18 ENCOUNTER — Emergency Department (HOSPITAL_COMMUNITY): Payer: Medicare HMO

## 2019-09-18 DIAGNOSIS — G40909 Epilepsy, unspecified, not intractable, without status epilepticus: Secondary | ICD-10-CM | POA: Diagnosis not present

## 2019-09-18 DIAGNOSIS — Y939 Activity, unspecified: Secondary | ICD-10-CM | POA: Diagnosis not present

## 2019-09-18 DIAGNOSIS — Z79899 Other long term (current) drug therapy: Secondary | ICD-10-CM | POA: Insufficient documentation

## 2019-09-18 DIAGNOSIS — Y999 Unspecified external cause status: Secondary | ICD-10-CM | POA: Insufficient documentation

## 2019-09-18 DIAGNOSIS — S0990XA Unspecified injury of head, initial encounter: Secondary | ICD-10-CM | POA: Diagnosis not present

## 2019-09-18 DIAGNOSIS — R404 Transient alteration of awareness: Secondary | ICD-10-CM | POA: Diagnosis not present

## 2019-09-18 DIAGNOSIS — I4510 Unspecified right bundle-branch block: Secondary | ICD-10-CM | POA: Diagnosis not present

## 2019-09-18 DIAGNOSIS — F0390 Unspecified dementia without behavioral disturbance: Secondary | ICD-10-CM | POA: Insufficient documentation

## 2019-09-18 DIAGNOSIS — R000 Tachycardia, unspecified: Secondary | ICD-10-CM | POA: Diagnosis not present

## 2019-09-18 DIAGNOSIS — I2510 Atherosclerotic heart disease of native coronary artery without angina pectoris: Secondary | ICD-10-CM | POA: Insufficient documentation

## 2019-09-18 DIAGNOSIS — W1830XA Fall on same level, unspecified, initial encounter: Secondary | ICD-10-CM | POA: Diagnosis not present

## 2019-09-18 DIAGNOSIS — Z7982 Long term (current) use of aspirin: Secondary | ICD-10-CM | POA: Diagnosis not present

## 2019-09-18 DIAGNOSIS — I10 Essential (primary) hypertension: Secondary | ICD-10-CM | POA: Diagnosis not present

## 2019-09-18 DIAGNOSIS — Y929 Unspecified place or not applicable: Secondary | ICD-10-CM | POA: Insufficient documentation

## 2019-09-18 DIAGNOSIS — W19XXXA Unspecified fall, initial encounter: Secondary | ICD-10-CM | POA: Diagnosis not present

## 2019-09-18 DIAGNOSIS — R569 Unspecified convulsions: Secondary | ICD-10-CM | POA: Insufficient documentation

## 2019-09-18 DIAGNOSIS — R4020 Unspecified coma: Secondary | ICD-10-CM | POA: Diagnosis not present

## 2019-09-18 DIAGNOSIS — Z87891 Personal history of nicotine dependence: Secondary | ICD-10-CM | POA: Diagnosis not present

## 2019-09-18 DIAGNOSIS — G20 Parkinson's disease: Secondary | ICD-10-CM | POA: Insufficient documentation

## 2019-09-18 DIAGNOSIS — I6389 Other cerebral infarction: Secondary | ICD-10-CM | POA: Diagnosis not present

## 2019-09-18 DIAGNOSIS — S199XXA Unspecified injury of neck, initial encounter: Secondary | ICD-10-CM | POA: Diagnosis not present

## 2019-09-18 LAB — CBC WITH DIFFERENTIAL/PLATELET
Abs Immature Granulocytes: 0.02 10*3/uL (ref 0.00–0.07)
Basophils Absolute: 0 10*3/uL (ref 0.0–0.1)
Basophils Relative: 1 %
Eosinophils Absolute: 0.2 10*3/uL (ref 0.0–0.5)
Eosinophils Relative: 2 %
HCT: 39.9 % (ref 39.0–52.0)
Hemoglobin: 13.2 g/dL (ref 13.0–17.0)
Immature Granulocytes: 0 %
Lymphocytes Relative: 39 %
Lymphs Abs: 2.9 10*3/uL (ref 0.7–4.0)
MCH: 29.2 pg (ref 26.0–34.0)
MCHC: 33.1 g/dL (ref 30.0–36.0)
MCV: 88.3 fL (ref 80.0–100.0)
Monocytes Absolute: 0.7 10*3/uL (ref 0.1–1.0)
Monocytes Relative: 9 %
Neutro Abs: 3.7 10*3/uL (ref 1.7–7.7)
Neutrophils Relative %: 49 %
Platelets: 201 10*3/uL (ref 150–400)
RBC: 4.52 MIL/uL (ref 4.22–5.81)
RDW: 13.5 % (ref 11.5–15.5)
WBC: 7.6 10*3/uL (ref 4.0–10.5)
nRBC: 0 % (ref 0.0–0.2)

## 2019-09-18 LAB — URINALYSIS, ROUTINE W REFLEX MICROSCOPIC
Bilirubin Urine: NEGATIVE
Glucose, UA: NEGATIVE mg/dL
Hgb urine dipstick: NEGATIVE
Ketones, ur: NEGATIVE mg/dL
Leukocytes,Ua: NEGATIVE
Nitrite: NEGATIVE
Protein, ur: NEGATIVE mg/dL
Specific Gravity, Urine: 1.009 (ref 1.005–1.030)
pH: 7 (ref 5.0–8.0)

## 2019-09-18 LAB — BASIC METABOLIC PANEL
Anion gap: 9 (ref 5–15)
BUN: 12 mg/dL (ref 8–23)
CO2: 22 mmol/L (ref 22–32)
Calcium: 8.5 mg/dL — ABNORMAL LOW (ref 8.9–10.3)
Chloride: 105 mmol/L (ref 98–111)
Creatinine, Ser: 1.12 mg/dL (ref 0.61–1.24)
GFR calc Af Amer: >60 mL/min (ref >60)
GFR calc non Af Amer: >60 mL/min (ref >60)
Glucose, Bld: 103 mg/dL — ABNORMAL HIGH (ref 70–99)
Potassium: 4.5 mmol/L (ref 3.5–5.1)
Sodium: 136 mmol/L (ref 135–145)

## 2019-09-18 LAB — CBG MONITORING, ED: Glucose-Capillary: 92 mg/dL (ref 70–99)

## 2019-09-18 NOTE — ED Provider Notes (Signed)
[facility] Provider Note   CSN: [id number] Arrival date & time: 09/18/19  0145     History Chief Complaint  Patient presents with  . Fall    Tyrone Noble is a 69 y.o. male.  Patient brought to the emergency department by ambulance from home after a fall. Family heard the patient fall but it was unwitnessed. They immediately went to the patient's age and found him on the ground. They did not see any seizure activity although he does have a history of seizures. EMS report that he was incontinent of urine and did have some decreased level of consciousness that they thought might be consistent with a postictal state. He has become more awake and alert during transport and is now answering some questions. He does not remember the fall. He is not complaining of any pain.        Past Medical History:  Diagnosis Date  . Coronary artery disease   . Dementia (HCC)   . Depression   . Headache    "monthly" (02/16/2015)  . Heart disease   . Hypertension   . Memory loss   . Seizures (HCC)   . Stroke [facility](HCC)    "years ago" (02/16/2015)    Patient Active Problem List   Diagnosis Date Noted  . Parkinsonism (HCC) 02/26/2018  . Therapeutic drug monitoring 09/12/2017  . Seizures (HCC) 05/31/2017  . Dementia without behavioral disturbance (HCC) 05/31/2017  . New-onset angina (HCC) 02/16/2015  . HYPERLIPIDEMIA 12/13/2009  . ALCOHOLISM 07/07/2009  . CALLUS, TOE 01/03/2007  . DEPRESSION, MAJOR, RECURRENT 05/17/2006  . TOBACCO DEPENDENCE 05/17/2006  . HYPERTENSION, BENIGN SYSTEMIC 05/17/2006  . HEMORRHOIDS, NOS 05/17/2006  . GASTROESOPHAGEAL REFLUX, NO ESOPHAGITIS 05/17/2006  . LEG PAIN OR KNEE PAIN 05/17/2006    Past Surgical History:  Procedure Laterality Date  . CARDIAC CATHETERIZATION N/A 02/16/2015   Procedure: Left Heart Cath and Coronary Angiography;  Surgeon: Rinaldo CloudMohan Harwani, MD;  [facility];  Service:  Cardiovascular;  Laterality: N/A;  . CARDIAC CATHETERIZATION N/A 02/16/2015   Procedure: Coronary Stent Intervention;  Surgeon: Rinaldo CloudMohan Harwani, MD;  Location: [facility] INVASIVE CV LAB;  Service: Cardiovascular;  Laterality: N/A;  . CARDIAC CATHETERIZATION N/A 02/16/2015   Procedure: Intravascular Pressure Wire/FFR Study;  Surgeon: Rinaldo CloudMohan Harwani, MD;  [facility];  Service: Cardiovascular;  Laterality: N/A;  . CORONARY ANGIOPLASTY    . LEFT HEART CATH AND CORONARY ANGIOGRAPHY N/A 03/29/2017   Procedure: LEFT HEART CATH AND CORONARY ANGIOGRAPHY;  Surgeon: Rinaldo CloudHarwani, Mohan, MD;  Location: [facility] INVASIVE CV LAB;  Service: Cardiovascular;  Laterality: N/A;  . LEFT HEART CATHETERIZATION WITH CORONARY ANGIOGRAM N/A 03/19/2012   Procedure: LEFT HEART CATHETERIZATION WITH CORONARY ANGIOGRAM;  Surgeon: Robynn PaneMohan N Harwani, MD;  Location: [facility] CATH LAB;  Service: Cardiovascular;  Laterality: N/A;  . WISDOM TOOTH EXTRACTION         Family History  Problem Relation Age of Onset  . Diabetes Mother   . Hypertension Mother   . Stroke Father   . Cancer Sister     Social History   Tobacco Use  . Smoking status: Former Games developermoker  . Smokeless tobacco: Never Used  . Tobacco comment: quit many years ago  Substance Use Topics  . Alcohol use: Not Currently    Comment: 02/16/2015 "last alcohol was in ~ 2012"  . Drug use: No    Home Medications Prior to Admission medications   Medication Sig Start Date End Date Taking?  Authorizing Provider  aspirin 81 MG chewable tablet Chew 1 tablet (81 mg total) by mouth daily. 02/17/15   Rinaldo Cloud, MD  atorvastatin (LIPITOR) 20 MG tablet Take 20 mg by mouth daily.  05/08/17   [provider]  carbidopa-levodopa (SINEMET IR) 25-100 MG tablet Take 1 tablet by mouth 3 (three) times daily. 07/29/19   Glean Salvo, NP  divalproex (DEPAKOTE ER) 500 MG 24 hr tablet Take 2 tablets (1,000 mg total) by mouth at bedtime. 07/29/19   Glean Salvo, NP  levETIRAcetam  (KEPPRA) 500 MG tablet Take 2 tablets (1,000 mg total) by mouth 2 (two) times daily. 07/29/19   Glean Salvo, NP  lisinopril (PRINIVIL,ZESTRIL) 20 MG tablet Take 1 tablet (20 mg total) by mouth daily. 04/21/18   Luevenia Maxin, Mina A, PA-C  losartan (COZAAR) 25 MG tablet Take 25 mg by mouth daily. 12/09/18   [provider]  metoprolol succinate (TOPROL-XL) 25 MG 24 hr tablet Take 25 mg by mouth daily. 11/19/15   [provider]  risperiDONE (RISPERDAL) 1 MG tablet Take 1 tablet (1 mg total) by mouth at bedtime. 02/26/18   Levert Feinstein, MD    Allergies    Aricept Palma Holter hcl] and Penicillins  Review of Systems   Review of Systems  Respiratory: Negative for shortness of breath.   Cardiovascular: Negative for chest pain.  Musculoskeletal: Negative for back pain.  All other systems reviewed and are negative.   Physical Exam Updated Vital Signs BP (!) 144/77 (BP Location: Right Arm)   Pulse 71   Temp 98 F (36.7 C) (Oral)   Resp 18   SpO2 100%   Physical Exam Vitals and nursing note reviewed.  Constitutional:      General: He is not in acute distress.    Appearance: Normal appearance. He is well-developed.  HENT:     Head: Normocephalic and atraumatic.     Right Ear: Hearing normal.     Left Ear: Hearing normal.     Nose: Nose normal.  Eyes:     Conjunctiva/sclera: Conjunctivae normal.     Pupils: Pupils are equal, round, and reactive to light.  Cardiovascular:     Rate and Rhythm: Regular rhythm.     Heart sounds: S1 normal and S2 normal. No murmur heard.  No friction rub. No gallop.   Pulmonary:     Effort: Pulmonary effort is normal. No respiratory distress.     Breath sounds: Normal breath sounds.  Chest:     Chest wall: No tenderness.  Abdominal:     General: Bowel sounds are normal.     Palpations: Abdomen is soft.     Tenderness: There is no abdominal tenderness. There is no guarding or rebound. Negative signs include Murphy's sign and McBurney's sign.      Hernia: No hernia is present.  Musculoskeletal:        General: Normal range of motion.     Cervical back: Normal range of motion and neck supple.  Skin:    General: Skin is warm and dry.     Findings: No rash.  Neurological:     Mental Status: He is alert and oriented to person, place, and time.     GCS: GCS eye subscore is 4. GCS verbal subscore is 5. GCS motor subscore is 6.     Cranial Nerves: No cranial nerve deficit.     Sensory: No sensory deficit.     Coordination: Coordination normal.  Psychiatric:  Speech: Speech normal.        Behavior: Behavior normal.        Thought Content: Thought content normal.     ED Results / Procedures / Treatments   Labs (all labs ordered are listed, but only abnormal results are displayed) Labs Reviewed  BASIC METABOLIC PANEL - Abnormal; Notable for the following components:      Result Value   Glucose, Bld 103 (*)    Calcium 8.5 (*)    All other components within normal limits  URINALYSIS, ROUTINE W REFLEX MICROSCOPIC - Abnormal; Notable for the following components:   Color, Urine STRAW (*)    All other components within normal limits  CBC WITH DIFFERENTIAL/PLATELET  CBG MONITORING, ED    EKG None  Radiology CT HEAD WO CONTRAST  Result Date: 09/18/2019 CLINICAL DATA:  Unresponsive after fall in bathroom.  Dementia. EXAM: CT HEAD WITHOUT CONTRAST CT CERVICAL SPINE WITHOUT CONTRAST TECHNIQUE: Multidetector CT imaging of the head and cervical spine was performed following the standard protocol without intravenous contrast. Multiplanar CT image reconstructions of the cervical spine were also generated. COMPARISON:  01/18/2019 head CT FINDINGS: CT HEAD FINDINGS Brain: No evidence of acute infarction, hemorrhage, hydrocephalus, extra-axial collection or mass lesion/mass effect. Chronic lacune at the right thalamus and in the right centrum semiovale. Cerebral volume loss. Vascular: No hyperdense vessel or unexpected calcification.  Skull: Normal. Negative for fracture or focal lesion. Sinuses/Orbits: No evidence of injury CT CERVICAL SPINE FINDINGS Alignment: Normal. Skull base and vertebrae: Negative for fracture Soft tissues and spinal canal: No prevertebral fluid or swelling. No visible canal hematoma. Disc levels:  Ordinary degenerative changes. Upper chest: Negative IMPRESSION: No evidence of intracranial or cervical spine injury. Electronically Signed   By: Marnee Spring M.D.   On: 09/18/2019 04:31   CT CERVICAL SPINE WO CONTRAST  Result Date: 09/18/2019 CLINICAL DATA:  Unresponsive after fall in bathroom.  Dementia. EXAM: CT HEAD WITHOUT CONTRAST CT CERVICAL SPINE WITHOUT CONTRAST TECHNIQUE: Multidetector CT imaging of the head and cervical spine was performed following the standard protocol without intravenous contrast. Multiplanar CT image reconstructions of the cervical spine were also generated. COMPARISON:  01/18/2019 head CT FINDINGS: CT HEAD FINDINGS Brain: No evidence of acute infarction, hemorrhage, hydrocephalus, extra-axial collection or mass lesion/mass effect. Chronic lacune at the right thalamus and in the right centrum semiovale. Cerebral volume loss. Vascular: No hyperdense vessel or unexpected calcification. Skull: Normal. Negative for fracture or focal lesion. Sinuses/Orbits: No evidence of injury CT CERVICAL SPINE FINDINGS Alignment: Normal. Skull base and vertebrae: Negative for fracture Soft tissues and spinal canal: No prevertebral fluid or swelling. No visible canal hematoma. Disc levels:  Ordinary degenerative changes. Upper chest: Negative IMPRESSION: No evidence of intracranial or cervical spine injury. Electronically Signed   By: Marnee Spring M.D.   On: 09/18/2019 04:31    Procedures Procedures (including critical care time)  Medications Ordered in ED Medications - No data to display  ED Course  I have reviewed the triage vital signs and the nursing notes.  Pertinent labs & imaging results  that were available during my care of the patient were reviewed by me and considered in my medical decision making (see chart for details).    MDM Rules/Calculators/A&P                          Patient brought to the emergency department for evaluation after a fall.  Patient does have a history  of seizures.  I suspect that he did have a seizure tonight that caused the fall.  The fall was unwitnessed by family.  Family did hear him fall, however and ran into the room to find him on the ground.  He was not experiencing any seizure activity at that time but he was noted to be incontinent of urine.  EMS report that he seemed postictal during transport but has been improving his mentation.  He has now back to his normal baseline according to family.  CT head and cervical spine are unremarkable.  Lab work was unremarkable.  He has not had any further seizure-like activity.  Patient is safe for discharge, continue current medication regimen, follow-up with PCP and/or neurologist. Final Clinical Impression(s) / ED Diagnoses Final diagnoses:  Fall, initial encounter  Seizure [facility])    Rx / DC Orders ED Discharge Orders    None       Jeraline Marcinek, Canary Brim, MD 09/18/19 [phone]

## 2019-09-18 NOTE — ED Triage Notes (Signed)
Pt BIB [facility] from home  Pt family heard pt fall in bathroom and within seconds saw pt on ground, unresponsive. No seizure activity witnessed.   Pt hx of seizures, on Keppra, last known seizure was in January 2021  On arrival to ED pt minimally responsive, will nod head yes and no.

## 2019-09-18 NOTE — ED Notes (Signed)
Sign-pad unavailable upon departure. Pt provided with and verbalizes understanding of discharge instructions.   Pt at baseline upon departure, wrist band removed.

## 2019-09-18 NOTE — ED Notes (Signed)
Unable to get height and weight to due pt being lethargic post "seizure"

## 2019-10-03 DIAGNOSIS — E785 Hyperlipidemia, unspecified: Secondary | ICD-10-CM | POA: Diagnosis not present

## 2019-10-03 DIAGNOSIS — I10 Essential (primary) hypertension: Secondary | ICD-10-CM | POA: Diagnosis not present

## 2019-10-03 DIAGNOSIS — I2510 Atherosclerotic heart disease of native coronary artery without angina pectoris: Secondary | ICD-10-CM | POA: Diagnosis not present

## 2020-01-05 ENCOUNTER — Other Ambulatory Visit: Payer: Self-pay | Admitting: *Deleted

## 2020-01-05 ENCOUNTER — Telehealth: Payer: Self-pay | Admitting: Neurology

## 2020-01-05 MED ORDER — CARBIDOPA-LEVODOPA 25-100 MG PO TABS: 1.0000 | ORAL_TABLET | Freq: Three times a day (TID) | ORAL | 0 refills | Status: DC

## 2020-01-05 NOTE — Telephone Encounter (Signed)
Spoke to son, and prescription was done by Dr. Zannie Cove nurse.  He appreciated this.
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
## 2020-01-07 DIAGNOSIS — R7309 Other abnormal glucose: Secondary | ICD-10-CM | POA: Diagnosis not present

## 2020-01-07 DIAGNOSIS — Z23 Encounter for immunization: Secondary | ICD-10-CM | POA: Diagnosis not present

## 2020-01-07 DIAGNOSIS — Z125 Encounter for screening for malignant neoplasm of prostate: Secondary | ICD-10-CM | POA: Diagnosis not present

## 2020-01-07 DIAGNOSIS — I10 Essential (primary) hypertension: Secondary | ICD-10-CM | POA: Diagnosis not present

## 2020-01-07 DIAGNOSIS — F0391 Unspecified dementia with behavioral disturbance: Secondary | ICD-10-CM | POA: Diagnosis not present

## 2020-01-07 DIAGNOSIS — I2510 Atherosclerotic heart disease of native coronary artery without angina pectoris: Secondary | ICD-10-CM | POA: Diagnosis not present

## 2020-01-07 DIAGNOSIS — E7800 Pure hypercholesterolemia, unspecified: Secondary | ICD-10-CM | POA: Diagnosis not present

## 2020-01-07 DIAGNOSIS — Z79899 Other long term (current) drug therapy: Secondary | ICD-10-CM | POA: Diagnosis not present

## 2020-01-07 DIAGNOSIS — F321 Major depressive disorder, single episode, moderate: Secondary | ICD-10-CM | POA: Diagnosis not present

## 2020-01-29 ENCOUNTER — Other Ambulatory Visit: Payer: Self-pay

## 2020-01-29 ENCOUNTER — Ambulatory Visit: Payer: Medicare HMO | Admitting: Neurology

## 2020-01-29 ENCOUNTER — Encounter: Payer: Self-pay | Admitting: Neurology

## 2020-01-29 VITALS — BP 147/87 | HR 74 | Ht 66.0 in | Wt 160.0 lb

## 2020-01-29 DIAGNOSIS — F0390 Unspecified dementia without behavioral disturbance: Secondary | ICD-10-CM | POA: Diagnosis not present

## 2020-01-29 DIAGNOSIS — G20 Parkinson's disease: Secondary | ICD-10-CM

## 2020-01-29 DIAGNOSIS — R569 Unspecified convulsions: Secondary | ICD-10-CM | POA: Diagnosis not present

## 2020-01-29 MED ORDER — CARBIDOPA-LEVODOPA 25-100 MG PO TABS: 1.0000 | ORAL_TABLET | Freq: Three times a day (TID) | ORAL | 4 refills | Status: DC

## 2020-01-29 MED ORDER — RISPERIDONE 1 MG PO TABS: 1.0000 mg | ORAL_TABLET | Freq: Every day | ORAL | 11 refills | Status: DC

## 2020-01-29 MED ORDER — LEVETIRACETAM 500 MG PO TABS: 1000.0000 mg | ORAL_TABLET | Freq: Two times a day (BID) | ORAL | 3 refills | Status: DC

## 2020-01-29 MED ORDER — DIVALPROEX SODIUM ER 500 MG PO TB24: 1000.0000 mg | ORAL_TABLET | Freq: Every day | ORAL | 3 refills | Status: DC

## 2020-01-29 NOTE — Progress Notes (Signed)
HISTORY OF PRESENT ILLNESS: Tyrone Noble a 69 year old male, seen in refer byhis primary care doctor [name], Dib[place]for evaluation of seizure, he is accompanied by his son in law Grayland Ormond today's clinical visit, he has lived with his daughter's family for5 years.  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 Marjory Lies in 2017 for dementia, he did also have a history of alcohol abuse, in remission for many years.  He presented to the emergency room on May 26, 2017 for seizure, he was ready to have breakfast, without warning signs, he began to stare into the space, not responding, whole body shaking, foaming, out of his mouth, postevent confusion last for a few minutes, paramedic was called, he was taken to the emergency room,  MRI of the brain showed no acute abnormality, generalized atrophy, moderate supratentorium small vessel disease.  Laboratory evaluation seen March 2019 showed normal CBC, BMP showed elevated creatinine 1.37, GFR of 51, negative troponin, UA was normal  He is now back to baseline, family denies significant agitations, Mini-Mental Status Examination only 18 out of 30 today  UPDATE Feb 26 2018: He is accompanied by his daughter and son in law at visit. He is overall stable,continue to complains of short term memory loss, slow walking since 2018, sleeps too much, poor appetite,   Last seizure was on Nov 30th 2019, he was awake, not responsive,  Feb 18 2018, Sudden onset of disorientation confusion  He is taking Depakote DR 500 mg 2 tablets every night  UPDATE Feb 06 2019: Last visit was in October 2020, he had a recurrent seizure on December 19, 2018, his Keppra dose was  increased to 1000 mg twice a day, Depakote ER 200 mg daily  He continues to have seizure, had 2 recurrent seizure since last visit, the most recent one was on January 30, 2019, body shaking, not responsive  Laboratory evaluations December 24, 2018 showed Depakote level of 63, Keppra level of 38, CMP showed elevated creatinine 1.36, UDS was negative  He is taking Sinemet 25/100 mg 3 times a day, which has helped his walking,  In addition, he was noted to have worsening confusion, auditory hallucinations, scared, agitated sometimes,  He continue has mild gait abnormality, taking Sinemet 25/100 mg 3 times a day, which was helpful  UPDATE Jan 29 2020: He is accompanied by his daughter at today's clinical visit, he has been doing well over the past few months, last reported seizure was in July 2021, was found down at bathroom, in the middle of the seizing,  He is now taking Depakote ER 500 mg 2 tablets at bedtime, was on Keppra 500 mg 2 tablets twice a day, but by mistake, since November 2021, he was only getting Keppra 500 mg twice a day, daughter did not notice any difference, there was no recurrent seizure  He continued to [facility], taking Sinemet 25/100 mg 3 times a day, also Risperdal 1 mg every night,  Daughter is overall about his current progress   REVIEW OF SYSTEMS: Out of a complete 14 system review of symptoms, the patient complains only of the following symptoms, and all other reviewed systems are negative.  Seizures, memory loss  ALLERGIES: Allergies  Allergen Reactions  . Aricept [Donepezil Hcl] Nausea Only  . Penicillins Nausea And Vomiting    Has patient had a PCN reaction causing immediate rash, facial/tongue/throat swelling, SOB or lightheadedness with hypotension: No  Has patient had a PCN reaction causing severe rash involving mucus membranes or skin necrosis: No  Has patient had a PCN reaction that required hospitalization: No   Has patient had a PCN reaction occurring within the last 10 years: No  If all of the above answers are "NO", then may proceed with Cephalosporin use.    HOME MEDICATIONS: Outpatient Medications Prior to Visit  Medication Sig Dispense Refill  . aspirin 81 MG chewable tablet Chew 1 tablet (81 mg total) by mouth daily. 30 tablet 3  . atorvastatin (LIPITOR) 20 MG tablet Take 20 mg by mouth daily.   5  . carbidopa-levodopa (SINEMET IR) 25-100 MG tablet Take 1 tablet by mouth 3 (three) times daily. 270 tablet 0  . divalproex (DEPAKOTE ER) 500 MG 24 hr tablet Take 2 tablets (1,000 mg total) by mouth at bedtime. 180 tablet 3  . levETIRAcetam (KEPPRA) 500 MG tablet Take 2 tablets (1,000 mg total) by mouth 2 (two) times daily. 360 tablet 3  . lisinopril (PRINIVIL,ZESTRIL) 20 MG tablet Take 1 tablet (20 mg total) by mouth daily. 30 tablet 0  . losartan (COZAAR) 25 MG tablet Take 25 mg by mouth daily.    . metoprolol succinate (TOPROL-XL) 25 MG 24 hr tablet Take 25 mg by mouth daily.    . risperiDONE (RISPERDAL) 1 MG tablet Take 1 tablet (1 mg total) by mouth at bedtime. 30 tablet 11   No facility-administered medications prior to visit.    PAST MEDICAL HISTORY: Past Medical History:  Diagnosis Date  . Coronary artery disease   . Dementia (HCC)   . Depression   . Headache    "monthly" (02/16/2015)  . Heart disease   . Hypertension   . Memory loss   . Seizures (HCC)   . Stroke [facility])    "years ago" (02/16/2015)    PAST SURGICAL HISTORY: Past Surgical History:  Procedure Laterality Date  . CARDIAC CATHETERIZATION N/A 02/16/2015   Procedure: Left Heart Cath and Coronary Angiography;  Surgeon: Rinaldo Cloud, MD;  Location: [facility] INVASIVE CV LAB;  Service: Cardiovascular;  Laterality: N/A;  . CARDIAC CATHETERIZATION N/A 02/16/2015   Procedure: Coronary Stent Intervention;  Surgeon: Rinaldo Cloud, MD;  Location: [facility] INVASIVE CV LAB;  Service: Cardiovascular;  Laterality: N/A;  . CARDIAC  CATHETERIZATION N/A 02/16/2015   Procedure: Intravascular Pressure Wire/FFR Study;  Surgeon: Rinaldo Cloud, MD;  Location: [facility] INVASIVE CV LAB;  Service: Cardiovascular;  Laterality: N/A;  . CORONARY ANGIOPLASTY    . LEFT HEART CATH AND CORONARY ANGIOGRAPHY N/A 03/29/2017   Procedure: LEFT HEART CATH AND CORONARY ANGIOGRAPHY;  Surgeon: Rinaldo Cloud, MD;  Location: [facility] INVASIVE CV LAB;  Service: Cardiovascular;  Laterality: N/A;  . LEFT HEART CATHETERIZATION WITH CORONARY ANGIOGRAM N/A 03/19/2012   Procedure: LEFT HEART CATHETERIZATION WITH CORONARY ANGIOGRAM;  Surgeon: Robynn Pane, MD;  Location: [facility] CATH LAB;  Service: Cardiovascular;  Laterality: N/A;  . WISDOM TOOTH EXTRACTION      FAMILY HISTORY: Family History  Problem Relation Age of Onset  . Diabetes Mother   . Hypertension Mother   . Stroke Father   . Cancer Sister     SOCIAL HISTORY: Social History   Socioeconomic History  . Marital status: Divorced    Spouse name: Not on file  .  Number of children: 2  . Years of education: 12  . Highest education level: Not on file  Occupational History    Comment: re27tired Naval architecttruck driver  Tobacco Use  . Smoking status: Former Games developermoker  . Smokeless tobacco: Never Used  . Tobacco comment: quit many years ago  Substance and Sexual Activity  . Alcohol use: Not Currently    Comment: 02/16/2015 "last alcohol was in ~ 2012"  . Drug use: No  . Sexual activity: Not Currently  Other Topics Concern  . Not on file  Social History Narrative   Lives with daughter    caffeine -coffee,  1 cup daily   Social Determinants of Health   Financial Resource Strain:   . Difficulty of Paying Living Expenses: Not on file  Food Insecurity:   . Worried About Programme researcher, broadcasting/film/videounning Out of Food in the Last Year: Not on file  . Ran Out of Food in the Last Year: Not on file  Transportation Needs:   . Lack of Transportation (Medical): Not on file  . Lack of Transportation (Non-Medical): Not on file  Physical Activity:    . Days of Exercise per Week: Not on file  . Minutes of Exercise per Session: Not on file  Stress:   . Feeling of Stress : Not on file  Social Connections:   . Frequency of Communication with Friends and Family: Not on file  . Frequency of Social Gatherings with Friends and Family: Not on file  . Attends Religious Services: Not on file  . Active Member of Clubs or Organizations: Not on file  . Attends BankerClub or Organization Meetings: Not on file  . Marital Status: Not on file  Intimate Partner Violence:   . Fear of Current or Ex-Partner: Not on file  . Emotionally Abused: Not on file  . Physically Abused: Not on file  . Sexually Abused: Not on file   PHYSICAL EXAM  Vitals:   01/29/20 0729  BP: (!) 147/87  Pulse: 74  Weight: 160 lb (72.6 kg)  Height: 5\' 6"  (1.676 m)   Body mass index is 25.82 kg/m.  Generalized: Well developed, in no acute distress  MMSE - Mini Mental State Exam 02/27/2019 09/12/2017 05/31/2017  Orientation to time 0 1 1  Orientation to Place 3 4 3   Registration 3 3 3   Attention/ Calculation 0 5 3  Recall 0 0 0  Language- name 2 objects 2 2 2   Language- repeat 1 1 1   Language- follow 3 step command 3 3 3   Language- follow 3 step command-comments - - -  Language- read & follow direction 1 1 1   Write a sentence 0 1 1  Copy design 0 0 0  Total score 13 21 18     Neurological examination  Mentation: Alert, rely on his daughter to provide history, compliant with neurological examination, Cranial nerve II-XII: Pupils were equal round reactive to light. Extraocular movements were full, visual field were full on confrontational test. Facial sensation and strength were normal. Head turning and shoulder shrug  were normal and symmetric. Motor: Left more than right arm and leg rigidity, bradykinesia, there was no significant weakness, Sensory: Intact to light touch Coordination: No dysmetria Reflexes: Hypoactive and symmetric Gait: He needs push-up to get up  from seated position, leaning forward, decreased arm swing, left worse than right, small stride,  DIAGNOSTIC DATA (LABS, IMAGING, TESTING) - I reviewed patient records, labs, notes, testing and imaging myself where available.  Lab Results  Component Value  Date   WBC 7.6 09/18/2019   HGB 13.2 09/18/2019   HCT 39.9 09/18/2019   MCV 88.3 09/18/2019   PLT 201 09/18/2019      Component Value Date/Time   NA 136 09/18/2019 0157   NA 142 12/24/2018 1419   K 4.5 09/18/2019 0157   CL 105 09/18/2019 0157   CO2 22 09/18/2019 0157   GLUCOSE 103 (H) 09/18/2019 0157   BUN 12 09/18/2019 0157   BUN 16 12/24/2018 1419   CREATININE 1.12 09/18/2019 0157   CALCIUM 8.5 (L) 09/18/2019 0157   PROT 6.9 02/25/2019 1253   PROT 6.6 12/24/2018 1419   ALBUMIN 3.8 02/25/2019 1253   ALBUMIN 4.4 12/24/2018 1419   AST 18 02/25/2019 1253   ALT 8 02/25/2019 1253   ALKPHOS 63 02/25/2019 1253   BILITOT 0.5 02/25/2019 1253   BILITOT 0.2 12/24/2018 1419   GFRNONAA >60 09/18/2019 0157   GFRAA >60 09/18/2019 0157   Lab Results  Component Value Date   CHOL 186 03/18/2012   HDL 72 03/18/2012   LDLCALC 105 (H) 03/18/2012   TRIG 43 03/18/2012   CHOLHDL 2.6 03/18/2012   Lab Results  Component Value Date   HGBA1C 5.6 02/16/2016   Lab Results  Component Value Date   VITAMINB12 402 02/16/2016   Lab Results  Component Value Date   TSH 2.120 12/28/2015      ASSESSMENT AND PLAN 69 y.o. year old male  Parkinson's disease Dementia with behavioral issues Epilepsy  Most recurrent seizure was in July 2021,  Overall stable with current medications, but by mistake, he was given Keppra 500 mg twice a day instead of 2 tablets twice a day, Depakote ER 500 mg 2 tablets every night  Will check level, keep Keppra 500 mg twice a day now, may adjust the dosage depending on the level  Refill Sinemet 25/100 mg 3 times daily  Risperidone 1 mg every night, he is stable now, may consider Seroquel if he has any worsening  parkinsonian features, or nighttime agitations  Return to clinic in 6 months  Levert Feinstein, M.D. Ph.D.  [facility] [address] [place], [place] [address] Phone: [phone] Fax:      [phone]

## 2020-02-02 LAB — CBC WITH DIFFERENTIAL/PLATELET
Basophils Absolute: 0 10*3/uL (ref 0.0–0.2)
Basos: 1 %
EOS (ABSOLUTE): 0.2 10*3/uL (ref 0.0–0.4)
Eos: 3 %
Hematocrit: 44.4 % (ref 37.5–51.0)
Hemoglobin: 15.1 g/dL (ref 13.0–17.7)
Immature Grans (Abs): 0 10*3/uL (ref 0.0–0.1)
Immature Granulocytes: 0 %
Lymphocytes Absolute: 2.5 10*3/uL (ref 0.7–3.1)
Lymphs: 41 %
MCH: 29.3 pg (ref 26.6–33.0)
MCHC: 34 g/dL (ref 31.5–35.7)
MCV: 86 fL (ref 79–97)
Monocytes Absolute: 0.7 10*3/uL (ref 0.1–0.9)
Monocytes: 11 %
Neutrophils Absolute: 2.8 10*3/uL (ref 1.4–7.0)
Neutrophils: 44 %
Platelets: 232 10*3/uL (ref 150–450)
RBC: 5.16 x10E6/uL (ref 4.14–5.80)
RDW: 13.5 % (ref 11.6–15.4)
WBC: 6.2 10*3/uL (ref 3.4–10.8)

## 2020-02-02 LAB — COMPREHENSIVE METABOLIC PANEL
ALT: 19 IU/L (ref 0–44)
AST: 17 IU/L (ref 0–40)
Albumin/Globulin Ratio: 1.6 (ref 1.2–2.2)
Albumin: 4.5 g/dL (ref 3.8–4.8)
Alkaline Phosphatase: 86 IU/L (ref 44–121)
BUN/Creatinine Ratio: 11 (ref 10–24)
BUN: 12 mg/dL (ref 8–27)
Bilirubin Total: 0.4 mg/dL (ref 0.0–1.2)
CO2: 23 mmol/L (ref 20–29)
Calcium: 9.7 mg/dL (ref 8.6–10.2)
Chloride: 104 mmol/L (ref 96–106)
Creatinine, Ser: 1.12 mg/dL (ref 0.76–1.27)
GFR calc Af Amer: 77 mL/min/{1.73_m2} (ref >59)
GFR calc non Af Amer: 66 mL/min/{1.73_m2} (ref >59)
Globulin, Total: 2.9 g/dL (ref 1.5–4.5)
Glucose: 96 mg/dL (ref 65–99)
Potassium: 4.4 mmol/L (ref 3.5–5.2)
Sodium: 144 mmol/L (ref 134–144)
Total Protein: 7.4 g/dL (ref 6.0–8.5)

## 2020-02-02 LAB — VALPROIC ACID LEVEL: Valproic Acid Lvl: 66 ug/mL (ref 50–100)

## 2020-02-02 LAB — LEVETIRACETAM LEVEL: Levetiracetam Lvl: 11.5 ug/mL (ref 10.0–40.0)

## 2020-02-02 LAB — TSH: TSH: 2.51 u[IU]/mL (ref 0.450–4.500)

## 2020-02-03 ENCOUNTER — Telehealth: Payer: Self-pay | Admitting: Emergency Medicine

## 2020-02-03 NOTE — Telephone Encounter (Signed)
Called and talked to patient regarding Dr. Zannie Cove findings of blood work results.  Patient denied any questions and expressed appreciation.

## 2020-02-03 NOTE — Telephone Encounter (Signed)
-----   Message from Levert Feinstein, MD sent at 02/02/2020  2:50 PM EST ----- Please call patient for normal laboratory result

## 2020-03-01 DIAGNOSIS — G40909 Epilepsy, unspecified, not intractable, without status epilepticus: Secondary | ICD-10-CM | POA: Diagnosis not present

## 2020-03-01 DIAGNOSIS — E785 Hyperlipidemia, unspecified: Secondary | ICD-10-CM | POA: Diagnosis not present

## 2020-03-01 DIAGNOSIS — I25118 Atherosclerotic heart disease of native coronary artery with other forms of angina pectoris: Secondary | ICD-10-CM | POA: Diagnosis not present

## 2020-03-01 DIAGNOSIS — I10 Essential (primary) hypertension: Secondary | ICD-10-CM | POA: Diagnosis not present

## 2020-03-01 DIAGNOSIS — G20 Parkinson's disease: Secondary | ICD-10-CM | POA: Diagnosis not present

## 2020-03-02 DIAGNOSIS — Z23 Encounter for immunization: Secondary | ICD-10-CM | POA: Diagnosis not present

## 2020-03-02 DIAGNOSIS — S61211A Laceration without foreign body of left index finger without damage to nail, initial encounter: Secondary | ICD-10-CM | POA: Diagnosis not present

## 2020-03-02 DIAGNOSIS — I10 Essential (primary) hypertension: Secondary | ICD-10-CM | POA: Diagnosis not present

## 2020-03-18 ENCOUNTER — Other Ambulatory Visit: Payer: Self-pay

## 2020-03-18 ENCOUNTER — Inpatient Hospital Stay (HOSPITAL_COMMUNITY)
Admission: EM | Admit: 2020-03-18 | Discharge: 2020-03-21 | DRG: 179 | Disposition: A | Discharge status: Home / Self Care | Payer: Medicare HMO | Attending: Internal Medicine | Admitting: Internal Medicine

## 2020-03-18 DIAGNOSIS — Z880 Allergy status to penicillin: Secondary | ICD-10-CM | POA: Diagnosis not present

## 2020-03-18 DIAGNOSIS — Z833 Family history of diabetes mellitus: Secondary | ICD-10-CM | POA: Diagnosis not present

## 2020-03-18 DIAGNOSIS — R509 Fever, unspecified: Secondary | ICD-10-CM | POA: Diagnosis not present

## 2020-03-18 DIAGNOSIS — Z823 Family history of stroke: Secondary | ICD-10-CM

## 2020-03-18 DIAGNOSIS — E860 Dehydration: Secondary | ICD-10-CM | POA: Diagnosis not present

## 2020-03-18 DIAGNOSIS — G40909 Epilepsy, unspecified, not intractable, without status epilepticus: Secondary | ICD-10-CM | POA: Diagnosis present

## 2020-03-18 DIAGNOSIS — Z8249 Family history of ischemic heart disease and other diseases of the circulatory system: Secondary | ICD-10-CM | POA: Diagnosis not present

## 2020-03-18 DIAGNOSIS — G20 Parkinson's disease: Secondary | ICD-10-CM | POA: Diagnosis present

## 2020-03-18 DIAGNOSIS — Z87891 Personal history of nicotine dependence: Secondary | ICD-10-CM | POA: Diagnosis not present

## 2020-03-18 DIAGNOSIS — Z8673 Personal history of transient ischemic attack (TIA), and cerebral infarction without residual deficits: Secondary | ICD-10-CM

## 2020-03-18 DIAGNOSIS — U071 COVID-19: Principal | ICD-10-CM

## 2020-03-18 DIAGNOSIS — R262 Difficulty in walking, not elsewhere classified: Secondary | ICD-10-CM

## 2020-03-18 DIAGNOSIS — F32A Depression, unspecified: Secondary | ICD-10-CM | POA: Diagnosis present

## 2020-03-18 DIAGNOSIS — Z7982 Long term (current) use of aspirin: Secondary | ICD-10-CM | POA: Diagnosis not present

## 2020-03-18 DIAGNOSIS — I10 Essential (primary) hypertension: Secondary | ICD-10-CM | POA: Diagnosis present

## 2020-03-18 DIAGNOSIS — R531 Weakness: Secondary | ICD-10-CM

## 2020-03-18 DIAGNOSIS — F0280 Dementia in other diseases classified elsewhere without behavioral disturbance: Secondary | ICD-10-CM | POA: Diagnosis present

## 2020-03-18 DIAGNOSIS — Z888 Allergy status to other drugs, medicaments and biological substances status: Secondary | ICD-10-CM

## 2020-03-18 DIAGNOSIS — I2510 Atherosclerotic heart disease of native coronary artery without angina pectoris: Secondary | ICD-10-CM | POA: Diagnosis present

## 2020-03-18 DIAGNOSIS — G20C Parkinsonism, unspecified: Secondary | ICD-10-CM | POA: Diagnosis present

## 2020-03-18 DIAGNOSIS — F0390 Unspecified dementia without behavioral disturbance: Secondary | ICD-10-CM | POA: Diagnosis present

## 2020-03-18 DIAGNOSIS — Z79899 Other long term (current) drug therapy: Secondary | ICD-10-CM | POA: Diagnosis not present

## 2020-03-18 DIAGNOSIS — R059 Cough, unspecified: Secondary | ICD-10-CM | POA: Diagnosis not present

## 2020-03-19 ENCOUNTER — Emergency Department (HOSPITAL_COMMUNITY): Payer: Medicare HMO

## 2020-03-19 ENCOUNTER — Encounter (HOSPITAL_COMMUNITY): Payer: Self-pay | Admitting: Emergency Medicine

## 2020-03-19 DIAGNOSIS — Z79899 Other long term (current) drug therapy: Secondary | ICD-10-CM | POA: Diagnosis not present

## 2020-03-19 DIAGNOSIS — R531 Weakness: Secondary | ICD-10-CM

## 2020-03-19 DIAGNOSIS — U071 COVID-19: Secondary | ICD-10-CM

## 2020-03-19 DIAGNOSIS — Z823 Family history of stroke: Secondary | ICD-10-CM | POA: Diagnosis not present

## 2020-03-19 DIAGNOSIS — I10 Essential (primary) hypertension: Secondary | ICD-10-CM

## 2020-03-19 DIAGNOSIS — F0280 Dementia in other diseases classified elsewhere without behavioral disturbance: Secondary | ICD-10-CM | POA: Diagnosis present

## 2020-03-19 DIAGNOSIS — R509 Fever, unspecified: Secondary | ICD-10-CM | POA: Diagnosis not present

## 2020-03-19 DIAGNOSIS — I2510 Atherosclerotic heart disease of native coronary artery without angina pectoris: Secondary | ICD-10-CM | POA: Diagnosis present

## 2020-03-19 DIAGNOSIS — Z888 Allergy status to other drugs, medicaments and biological substances status: Secondary | ICD-10-CM | POA: Diagnosis not present

## 2020-03-19 DIAGNOSIS — Z8673 Personal history of transient ischemic attack (TIA), and cerebral infarction without residual deficits: Secondary | ICD-10-CM | POA: Diagnosis not present

## 2020-03-19 DIAGNOSIS — Z8249 Family history of ischemic heart disease and other diseases of the circulatory system: Secondary | ICD-10-CM | POA: Diagnosis not present

## 2020-03-19 DIAGNOSIS — G20 Parkinson's disease: Secondary | ICD-10-CM | POA: Diagnosis present

## 2020-03-19 DIAGNOSIS — Z87891 Personal history of nicotine dependence: Secondary | ICD-10-CM | POA: Diagnosis not present

## 2020-03-19 DIAGNOSIS — Z833 Family history of diabetes mellitus: Secondary | ICD-10-CM | POA: Diagnosis not present

## 2020-03-19 DIAGNOSIS — R059 Cough, unspecified: Secondary | ICD-10-CM | POA: Diagnosis not present

## 2020-03-19 DIAGNOSIS — Z7982 Long term (current) use of aspirin: Secondary | ICD-10-CM | POA: Diagnosis not present

## 2020-03-19 DIAGNOSIS — Z880 Allergy status to penicillin: Secondary | ICD-10-CM | POA: Diagnosis not present

## 2020-03-19 DIAGNOSIS — G40909 Epilepsy, unspecified, not intractable, without status epilepticus: Secondary | ICD-10-CM | POA: Diagnosis present

## 2020-03-19 DIAGNOSIS — F32A Depression, unspecified: Secondary | ICD-10-CM | POA: Diagnosis present

## 2020-03-19 LAB — CBC WITH DIFFERENTIAL/PLATELET
Abs Immature Granulocytes: 0.03 10*3/uL (ref 0.00–0.07)
Basophils Absolute: 0 10*3/uL (ref 0.0–0.1)
Basophils Relative: 0 %
Eosinophils Absolute: 0 10*3/uL (ref 0.0–0.5)
Eosinophils Relative: 0 %
HCT: 45.5 % (ref 39.0–52.0)
Hemoglobin: 14.9 g/dL (ref 13.0–17.0)
Immature Granulocytes: 1 %
Lymphocytes Relative: 18 %
Lymphs Abs: 1.1 10*3/uL (ref 0.7–4.0)
MCH: 29.1 pg (ref 26.0–34.0)
MCHC: 32.7 g/dL (ref 30.0–36.0)
MCV: 88.9 fL (ref 80.0–100.0)
Monocytes Absolute: 0.9 10*3/uL (ref 0.1–1.0)
Monocytes Relative: 15 %
Neutro Abs: 4.1 10*3/uL (ref 1.7–7.7)
Neutrophils Relative %: 66 %
Platelets: 188 10*3/uL (ref 150–400)
RBC: 5.12 MIL/uL (ref 4.22–5.81)
RDW: 13.9 % (ref 11.5–15.5)
WBC: 6.1 10*3/uL (ref 4.0–10.5)
nRBC: 0 % (ref 0.0–0.2)

## 2020-03-19 LAB — COMPREHENSIVE METABOLIC PANEL
ALT: 36 U/L (ref 0–44)
AST: 36 U/L (ref 15–41)
Albumin: 4.1 g/dL (ref 3.5–5.0)
Alkaline Phosphatase: 72 U/L (ref 38–126)
Anion gap: 11 (ref 5–15)
BUN: 17 mg/dL (ref 8–23)
CO2: 25 mmol/L (ref 22–32)
Calcium: 9.1 mg/dL (ref 8.9–10.3)
Chloride: 102 mmol/L (ref 98–111)
Creatinine, Ser: 1.22 mg/dL (ref 0.61–1.24)
GFR, Estimated: >60 mL/min (ref >60)
Glucose, Bld: 100 mg/dL — ABNORMAL HIGH (ref 70–99)
Potassium: 4.2 mmol/L (ref 3.5–5.1)
Sodium: 138 mmol/L (ref 135–145)
Total Bilirubin: 0.5 mg/dL (ref 0.3–1.2)
Total Protein: 7.7 g/dL (ref 6.5–8.1)

## 2020-03-19 LAB — CBC
HCT: 43 % (ref 39.0–52.0)
Hemoglobin: 14.2 g/dL (ref 13.0–17.0)
MCH: 29.7 pg (ref 26.0–34.0)
MCHC: 33 g/dL (ref 30.0–36.0)
MCV: 90 fL (ref 80.0–100.0)
Platelets: 168 10*3/uL (ref 150–400)
RBC: 4.78 MIL/uL (ref 4.22–5.81)
RDW: 14.1 % (ref 11.5–15.5)
WBC: 5.5 10*3/uL (ref 4.0–10.5)
nRBC: 0 % (ref 0.0–0.2)

## 2020-03-19 LAB — PROTIME-INR
INR: 1 (ref 0.8–1.2)
Prothrombin Time: 13.2 seconds (ref 11.4–15.2)

## 2020-03-19 LAB — URINALYSIS, ROUTINE W REFLEX MICROSCOPIC
Bilirubin Urine: NEGATIVE
Glucose, UA: NEGATIVE mg/dL
Hgb urine dipstick: NEGATIVE
Ketones, ur: 20 mg/dL — AB
Leukocytes,Ua: NEGATIVE
Nitrite: NEGATIVE
Protein, ur: NEGATIVE mg/dL
Specific Gravity, Urine: 1.027 (ref 1.005–1.030)
pH: 5 (ref 5.0–8.0)

## 2020-03-19 LAB — CREATININE, SERUM
Creatinine, Ser: 1.06 mg/dL (ref 0.61–1.24)
GFR, Estimated: >60 mL/min (ref >60)

## 2020-03-19 LAB — HIV ANTIBODY (ROUTINE TESTING W REFLEX): HIV Screen 4th Generation wRfx: NONREACTIVE

## 2020-03-19 LAB — RESP PANEL BY RT-PCR (FLU A&B, COVID) ARPGX2
Influenza A by PCR: NEGATIVE
Influenza B by PCR: NEGATIVE
SARS Coronavirus 2 by RT PCR: POSITIVE — AB

## 2020-03-19 LAB — TROPONIN I (HIGH SENSITIVITY): Troponin I (High Sensitivity): 4 ng/L (ref <18)

## 2020-03-19 LAB — LACTIC ACID, PLASMA: Lactic Acid, Venous: 1.5 mmol/L (ref 0.5–1.9)

## 2020-03-19 LAB — VALPROIC ACID LEVEL: Valproic Acid Lvl: 53 ug/mL (ref 50.0–100.0)

## 2020-03-19 MED ORDER — SENNOSIDES-DOCUSATE SODIUM 8.6-50 MG PO TABS: 1.0000 | ORAL_TABLET | Freq: Every evening | ORAL | Status: DC | PRN

## 2020-03-19 MED ORDER — LOSARTAN POTASSIUM 25 MG PO TABS
25.0000 mg | ORAL_TABLET | Freq: Every day | ORAL | Status: DC
  Administered 2020-03-19 – 2020-03-20 (×2): 25 mg via ORAL
  Filled 2020-03-19 (×2): qty 1

## 2020-03-19 MED ORDER — SODIUM CHLORIDE 0.9 % IV SOLN: 100.0000 mg | Freq: Every day | INTRAVENOUS | Status: DC

## 2020-03-19 MED ORDER — DIVALPROEX SODIUM ER 500 MG PO TB24
1000.0000 mg | ORAL_TABLET | Freq: Every day | ORAL | Status: DC
  Administered 2020-03-19 – 2020-03-20 (×2): 1000 mg via ORAL
  Filled 2020-03-19 (×2): qty 2

## 2020-03-19 MED ORDER — CARBIDOPA-LEVODOPA 25-100 MG PO TABS
1.0000 | ORAL_TABLET | Freq: Three times a day (TID) | ORAL | Status: DC
  Administered 2020-03-19 – 2020-03-21 (×6): 1 via ORAL
  Filled 2020-03-19 (×9): qty 1

## 2020-03-19 MED ORDER — ASPIRIN 81 MG PO CHEW
81.0000 mg | CHEWABLE_TABLET | Freq: Every day | ORAL | Status: DC
  Administered 2020-03-19 – 2020-03-21 (×3): 81 mg via ORAL
  Filled 2020-03-19 (×3): qty 1

## 2020-03-19 MED ORDER — ENOXAPARIN SODIUM 40 MG/0.4ML ~~LOC~~ SOLN
40.0000 mg | SUBCUTANEOUS | Status: DC
  Administered 2020-03-19 – 2020-03-21 (×3): 40 mg via SUBCUTANEOUS
  Filled 2020-03-19 (×3): qty 0.4

## 2020-03-19 MED ORDER — DEXAMETHASONE 4 MG PO TABS
6.0000 mg | ORAL_TABLET | Freq: Two times a day (BID) | ORAL | Status: DC
  Administered 2020-03-19 – 2020-03-21 (×4): 6 mg via ORAL
  Filled 2020-03-19 (×4): qty 1

## 2020-03-19 MED ORDER — ATORVASTATIN CALCIUM 10 MG PO TABS
20.0000 mg | ORAL_TABLET | Freq: Every day | ORAL | Status: DC
  Administered 2020-03-19 – 2020-03-21 (×3): 20 mg via ORAL
  Filled 2020-03-19 (×3): qty 2

## 2020-03-19 MED ORDER — RISPERIDONE 1 MG PO TABS
1.0000 mg | ORAL_TABLET | Freq: Every day | ORAL | Status: DC
  Administered 2020-03-19 – 2020-03-20 (×2): 1 mg via ORAL
  Filled 2020-03-19 (×2): qty 1

## 2020-03-19 MED ORDER — SODIUM CHLORIDE 0.9 % IV SOLN
100.0000 mg | Freq: Every day | INTRAVENOUS | Status: DC
  Administered 2020-03-20 – 2020-03-21 (×2): 100 mg via INTRAVENOUS
  Filled 2020-03-19 (×2): qty 20

## 2020-03-19 MED ORDER — LACTATED RINGERS IV SOLN: INTRAVENOUS | Status: DC

## 2020-03-19 MED ORDER — LEVETIRACETAM 500 MG PO TABS
1000.0000 mg | ORAL_TABLET | Freq: Two times a day (BID) | ORAL | Status: DC
  Administered 2020-03-19 – 2020-03-21 (×5): 1000 mg via ORAL
  Filled 2020-03-19 (×5): qty 2

## 2020-03-19 MED ORDER — ACETAMINOPHEN 500 MG PO TABS
1000.0000 mg | ORAL_TABLET | Freq: Once | ORAL | Status: AC
  Administered 2020-03-19: 1000 mg via ORAL
  Filled 2020-03-19: qty 2

## 2020-03-19 MED ORDER — DEXAMETHASONE 2 MG PO TABS: 6.0000 mg | ORAL_TABLET | Freq: Two times a day (BID) | ORAL | Status: DC

## 2020-03-19 MED ORDER — SODIUM CHLORIDE 0.9 % IV SOLN: 200.0000 mg | Freq: Once | INTRAVENOUS | Status: DC

## 2020-03-19 MED ORDER — SODIUM CHLORIDE 0.9 % IV SOLN
200.0000 mg | Freq: Once | INTRAVENOUS | Status: AC
  Administered 2020-03-19: 200 mg via INTRAVENOUS
  Filled 2020-03-19: qty 200

## 2020-03-19 MED ORDER — METOPROLOL SUCCINATE ER 50 MG PO TB24
25.0000 mg | ORAL_TABLET | Freq: Every day | ORAL | Status: DC
  Administered 2020-03-19 – 2020-03-21 (×3): 25 mg via ORAL
  Filled 2020-03-19 (×3): qty 1

## 2020-03-19 MED ORDER — GUAIFENESIN-DM 100-10 MG/5ML PO SYRP: 10.0000 mL | ORAL_SOLUTION | ORAL | Status: DC | PRN

## 2020-03-19 MED ORDER — ACETAMINOPHEN 325 MG PO TABS
650.0000 mg | ORAL_TABLET | Freq: Four times a day (QID) | ORAL | Status: DC | PRN
  Administered 2020-03-19: 650 mg via ORAL
  Filled 2020-03-19: qty 2

## 2020-03-19 NOTE — ED Triage Notes (Signed)
Patient arrives with family. Hx dementia, A&O x2. Patient is normally mobile, but per family was too weak today. Patient has a dry cough that family noticed yesterday. Patient does normally attend a day program for individuals with dementia. Patient is vaccinated for COVID

## 2020-03-19 NOTE — ED Notes (Signed)
Pt unable able to sit up and maintain posture.

## 2020-03-19 NOTE — Progress Notes (Signed)
69 year old male with history of seizures Parkinson's disease dementia hypertension and CAD admitted with fever cough generalized weakness.  Lives at home with his daughter. Chest x-ray and no active disease Vital signs are blood pressure 188/98 pulse is 85 his 100% on room air temperature 102.2. He has been started on remdesivi I will add steroids.  Encourage incentive spirometry. Follow-up Covid labs in a.m.

## 2020-03-19 NOTE — H&P (Signed)
History and Physical    Tyrone Noble:[id number] DOB: June 08, 1950 DOA: 03/18/2020  PCP: [facility]   Patient coming from:   home  Chief Complaint: Cough, weakness  HPI: Tyrone Noble is a 69 y.o. male with medical history significant for dementia, hypertension, CAD, seizures, Parkinson's disease who presents with 1 day history of nonproductive cough and generalized weakness. Lives at home with his daughter who is here and provides the history.  She states that she noticed a cough today and as the day went on he seemed to get weaker.  By the evening of March 18, 2020 patient was unable to sit up on his own and not able to ambulate.  Daughter reports he normally can get around the house and is very talkative.  She reports that he has not been as talkative or alert as he normally is.  Cough has been nonproductive.  Patient did not appear short of breath she reports and he had no complaints of any chest pain or pressure. No complaints of pain.  No vomiting or diarrhea.  No falls.  Normally ambulates without any assistive devices. He received both doses of the Moderna vaccine for Covid in the summer.  Not been exposed anyone with Covid that they know of and has had no sick contacts in the house.   ED Course: Tyrone Noble is found to be Covid positive he is hemodynamically stable and satting 97 to 99% on room air and not requiring supplemental oxygen.  He has significant weakness and is not able to even stand up at the bedside with assistance secondary to weakness.  Hospital service asked to evaluate and admit for further management  Review of Systems:  Unable to obtain secondary to dementia  Past Medical History:  Diagnosis Date  . Coronary artery disease   . Dementia (HCC)   . Depression   . Heart disease   . Hypertension   . Memory loss   . Seizures (HCC)   . Stroke [facility])    "years ago" (02/16/2015)    Past Surgical History:  Procedure Laterality  Date  . CARDIAC CATHETERIZATION N/A 02/16/2015   Procedure: Left Heart Cath and Coronary Angiography;  Surgeon: Rinaldo Cloud, MD;  Location: [facility] INVASIVE CV LAB;  Service: Cardiovascular;  Laterality: N/A;  . CARDIAC CATHETERIZATION N/A 02/16/2015   Procedure: Coronary Stent Intervention;  Surgeon: Rinaldo Cloud, MD;  Location: [facility] INVASIVE CV LAB;  Service: Cardiovascular;  Laterality: N/A;  . CARDIAC CATHETERIZATION N/A 02/16/2015   Procedure: Intravascular Pressure Wire/FFR Study;  Surgeon: Rinaldo Cloud, MD;  Location[facility] INVASIVE CV LAB;  Service: Cardiovascular;  Laterality: N/A;  . CORONARY ANGIOPLASTY    . LEFT HEART CATH AND CORONARY ANGIOGRAPHY N/A 03/29/2017   Procedure: LEFT HEART CATH AND CORONARY ANGIOGRAPHY;  Surgeon: Rinaldo Cloud, MD;  Location: [facility] INVASIVE CV LAB;  Service: Cardiovascular;  Laterality: N/A;  . LEFT HEART CATHETERIZATION WITH CORONARY ANGIOGRAM N/A 03/19/2012   Procedure: LEFT HEART CATHETERIZATION WITH CORONARY ANGIOGRAM;  Surgeon: Robynn Pane, MD;  Location: [facility] CATH LAB;  Service: Cardiovascular;  Laterality: N/A;  . WISDOM TOOTH EXTRACTION      Social History  reports that he has quit smoking. He has never used smokeless tobacco. He reports previous alcohol use. He reports that he does not use drugs.  Allergies  Allergen Reactions  . Aricept [Donepezil Hcl] Nausea Only  . Penicillins Nausea And Vomiting    Has patient had a PCN reaction causing immediate  rash, facial/tongue/throat swelling, SOB or lightheadedness with hypotension: No  Has patient had a PCN reaction causing severe rash involving mucus membranes or skin necrosis: No  Has patient had a PCN reaction that required hospitalization: No  Has patient had a PCN reaction occurring within the last 10 years: No  If all of the above answers are "NO", then may proceed with Cephalosporin use.    Family History  Problem Relation Age of Onset  . Diabetes Mother   . Hypertension Mother   . Stroke  Father   . Cancer Sister      Prior to Admission medications   Medication Sig Start Date End Date Taking? Authorizing Provider  aspirin 81 MG chewable tablet Chew 1 tablet (81 mg total) by mouth daily. 02/17/15   Rinaldo Cloud, MD  atorvastatin (LIPITOR) 20 MG tablet Take 20 mg by mouth daily.  05/08/17   [provider]  carbidopa-levodopa (SINEMET IR) 25-100 MG tablet Take 1 tablet by mouth 3 (three) times daily. 01/29/20   Levert Feinstein, MD  divalproex (DEPAKOTE ER) 500 MG 24 hr tablet Take 2 tablets (1,000 mg total) by mouth at bedtime. 01/29/20   Levert Feinstein, MD  levETIRAcetam (KEPPRA) 500 MG tablet Take 2 tablets (1,000 mg total) by mouth 2 (two) times daily. 01/29/20   Levert Feinstein, MD  lisinopril (PRINIVIL,ZESTRIL) 20 MG tablet Take 1 tablet (20 mg total) by mouth daily. 04/21/18   Luevenia Maxin, Mina A, PA-C  losartan (COZAAR) 25 MG tablet Take 25 mg by mouth daily. 12/09/18   [provider]  metoprolol succinate (TOPROL-XL) 25 MG 24 hr tablet Take 25 mg by mouth daily. 11/19/15   [provider]  risperiDONE (RISPERDAL) 1 MG tablet Take 1 tablet (1 mg total) by mouth at bedtime. 01/29/20   Levert Feinstein, MD    Physical Exam: Vitals:   03/19/20 0130 03/19/20 0230 03/19/20 0300 03/19/20 0330  BP: (!) 153/86 132/89 139/81 (!) 148/85  Pulse: 88 83 74 76  Resp: (!) 22 18 18 17   Temp:      TempSrc:      SpO2: 98% 98% 99% 98%  Weight:      Height:        Constitutional: NAD, calm, comfortable Vitals:   03/19/20 0130 03/19/20 0230 03/19/20 0300 03/19/20 0330  BP: (!) 153/86 132/89 139/81 (!) 148/85  Pulse: 88 83 74 76  Resp: (!) 22 18 18 17   Temp:      TempSrc:      SpO2: 98% 98% 99% 98%  Weight:      Height:       General: WDWN, sleeping elderly male Eyes:  PERRL, conjunctivae normal.  Sclera nonicteric HENT:  [place]/AT, external ears normal.  Nares patent without epistasis.  Mucous membranes are moist.  Neck: Soft, normal active range of motion, supple, no masses,  Trachea midline Respiratory: clear to auscultation bilaterally, no wheezing, no crackles. Normal respiratory effort. No accessory muscle use.  Cardiovascular: Regular rate and rhythm, no murmurs / rubs / gallops. No extremity edema. Abdomen: Soft, no tenderness, nondistended, no rebound or guarding. No masses palpated. Bowel sounds normoactive Musculoskeletal: FROM. no clubbing / cyanosis. No joint deformity upper and lower extremities. Normal muscle tone.  Skin: Warm, dry, intact no rashes, lesions, ulcers. No induration Neurologic: Moves all 4 extremities spontaneously.  Withdraws from painful stimuli.  Patella DTRs +1 bilaterally    Labs on Admission: I have personally reviewed following labs and imaging studies  CBC: Recent Labs  Lab 03/19/20 0027  WBC 6.1  NEUTROABS 4.1  HGB 14.9  HCT 45.5  MCV 88.9  PLT 188    Basic Metabolic Panel: Recent Labs  Lab 03/19/20 0027  NA 138  K 4.2  CL 102  CO2 25  GLUCOSE 100*  BUN 17  CREATININE 1.22  CALCIUM 9.1    GFR: Estimated Creatinine Clearance: 50.8 mL/min (by C-G formula based on SCr of 1.22 mg/dL).  Liver Function Tests: Recent Labs  Lab 03/19/20 0027  AST 36  ALT 36  ALKPHOS 72  BILITOT 0.5  PROT 7.7  ALBUMIN 4.1    Urine analysis:    Component Value Date/Time   COLORURINE YELLOW 03/19/2020 0334   APPEARANCEUR HAZY (A) 03/19/2020 0334   LABSPEC 1.027 03/19/2020 0334   PHURINE 5.0 03/19/2020 0334   GLUCOSEU NEGATIVE 03/19/2020 0334   HGBUR NEGATIVE 03/19/2020 0334   BILIRUBINUR NEGATIVE 03/19/2020 0334   KETONESUR 20 (A) 03/19/2020 0334   PROTEINUR NEGATIVE 03/19/2020 0334   UROBILINOGEN 1.0 03/17/2012 1615   NITRITE NEGATIVE 03/19/2020 0334   LEUKOCYTESUR NEGATIVE 03/19/2020 0334    Radiological Exams on Admission: DG Chest Portable 1 View  Result Date: 03/19/2020 CLINICAL DATA:  Cough, fever EXAM: PORTABLE CHEST 1 VIEW COMPARISON:  02/25/2019 FINDINGS: Lungs volumes are small, but are symmetric  and are clear. No pneumothorax or pleural effusion. Cardiac size within normal limits. Pulmonary vascularity is normal. Osseous structures are age-appropriate. No acute bone abnormality. IMPRESSION: No active disease. Electronically Signed   By: Helyn Numbers MD   On: 03/19/2020 00:59    EKG: Independently reviewed.  EKG shows normal sinus rhythm with right bundle branch block and left anterior fascicular block.  No acute ST elevation or depression.  QTc 473  Assessment/Plan Principal Problem:   COVID-19 virus infection Mr. Edmonds admitted to Med-Surg floor with COVID infection.  Started on remdesivir as patient has high risk for worsening Covid disease Patient is not requiring oxygen at this time and therefore steroids are not initiated Tylenol as needed. Antitussive with Tussionex twice daily provided  Active Problems:   Generalized weakness Consult physical therapy for evaluation.  General weakness may be secondary to Covid infection.  Daughter reports he was ambulating with his walker normally until the afternoon of March 18, 2020 when he progressively started getting weaker and not able to sit up or stand up without assistance.  In the emergency room he was not able to stand up by the bedside due to generalized weakness Fall precautions    Essential hypertension Continue home medication of metoprolol and losartan    Dementia without behavioral disturbance  Chronic and stable    Parkinsonism  Continue Sinemet    Seizure disorder  Placed on seizure precautions.  Continue antiseizure regimen from home including Keppra and Depakote   DVT prophylaxis:      Lovenox for DVT prophylaxis Code Status:   Full code. Discussed code status with daughter who is at bedside.  Family Communication:  Diagnosis and plan discussed with patient's daughters at bedside.  She verbalized understanding.  Further recommendations to follow as clinically indicated Disposition Plan:   Patient is  from:  Home  Anticipated DC to:  Home  Anticipated DC date:  Anticipate at least 2 midnight stay in the hospital  Anticipated DC barriers: No barriers to discharge identified   Admission status:  Inpatient   Claudean Severance Helen Cuff MD [facility]  How to contact the [facility] Attending or Consulting provider 7A - 7P  or covering provider during after hours 7P -7A, for this patient?   1. Check the care team in [facility] and look for a) attending/consulting TR[facility] provider listed and b) the [facility] team listed 2. Log into [email or web address] and use 's universal password to access. If you do not have the password, please contact the hospital operator. 3. Locate the [facility] provider you are looking for under [facility] and page to a number that you can be directly reached. 4. If you still have difficulty reaching the provider, please page the [facility] (Director on Call) for the Hospitalists listed on amion for assistance.  03/19/2020, 4:43 AM

## 2020-03-19 NOTE — Evaluation (Signed)
Physical Therapy Evaluation Patient Details Name: Tyrone Noble MRN: [id number] DOB: 04-16-1950 Today's Date: 03/19/2020   History of Present Illness  Tyrone Noble is a 69 y.o. male with medical history significant for dementia, HTN, CAD, seizures, Parkinson's disease with c/o nonproductive cough and generalized weakness.    Clinical Impression  Pt admitted with above diagnosis. Pt able to sit EOB with min A requiring increased time and with rigid movements. Pt able to stand with BLE braced against bed, therapist positioned anterior to pt providing HHA and steadying. Pt takes a few shuffling sidesteps up to [facility] before requiring seated rest break. Pt assisted back to supine with breakfast tray at bedside and daughter in room to assist. Pt on RA with SpO2 98-100% with mobility, BP elevated at EOS and RN notified. Pt daughter in room providing PTA status and states goal is to return home with HHPT and [facility]. Recommending HHPT and 24hr assist since family is able to provide. Pt currently with functional limitations due to the deficits listed below (see PT Problem List). Pt will benefit from skilled PT to increase their independence and safety with mobility to allow discharge to the venue listed below.       Follow Up Recommendations Home health PT;Supervision/Assistance - 24 hour    Equipment Recommendations  None recommended by PT    Recommendations for Other Services OT consult     Precautions / Restrictions Precautions Precautions: Fall Restrictions Weight Bearing Restrictions: No      Mobility  Bed Mobility Overal bed mobility: Needs Assistance Bed Mobility: Supine to Sit;Sit to Supine  Supine to sit: Min assist Sit to supine: Mod assist   General bed mobility comments: min A to upright trunk into sitting, mod A to lift BLE back into bed, rigid stiff movements with mobility    Transfers Overall transfer level: Needs assistance Equipment  used: Rolling walker (2 wheeled) Transfers: Sit to/from Stand Sit to Stand: Min assist    General transfer comment: min A to power up with BUE pushing from bed to assist, BLE braced on front of bed and posterior weight shift with minimal improvement  Ambulation/Gait Ambulation/Gait assistance: Mod assist  Assistive device: 1 person hand held assist Gait Pattern/deviations: Step-to pattern    General Gait Details: pt able to take 5-6 slow, short shuffling steps up to [facility] with bil HHA from therapist positioned in front of pt, posterior lean with BLE against front of bed despite cues for anterior weightshift, generally unsteady with LOB or fall  Stairs            Wheelchair Mobility    Modified Rankin (Stroke Patients Only)       Balance Overall balance assessment: Needs assistance Sitting-balance support: Feet supported;Bilateral upper extremity supported Sitting balance-Leahy Scale: Fair Sitting balance - Comments: seated EOB   Standing balance support: During functional activity;Bilateral upper extremity supported Standing balance-Leahy Scale: Poor Standing balance comment: reliant on UE support and assist          Pertinent Vitals/Pain Pain Assessment: No/denies pain    Home Living Family/patient expects to be discharged to:: Private residence Living Arrangements: Children Available Help at Discharge: Family;Available PRN/intermittently Type of Home: House Home Access: Stairs to enter Entrance Stairs-Rails: None Entrance Stairs-Number of Steps: 1 Home Layout: Two level;Able to live on main level with bedroom/bathroom Home Equipment: None Additional Comments: Pt goes to [facility] day[facility] center Monday-Friday. Daughter reports no falls.    Prior Function Level of  Independence: Independent         Comments: Pt's daughter reports pt independent with ambulation, bathing and dressing. Daughter completes cooking and cleaning.     Hand Dominance         Extremity/Trunk Assessment   Upper Extremity Assessment Upper Extremity Assessment: Generalized weakness    Lower Extremity Assessment Lower Extremity Assessment: Generalized weakness (AROM WNL, strength 3/5 throughout, slight decreased sensation to light touch to bil lower legs)    Cervical / Trunk Assessment Cervical / Trunk Assessment: Normal  Communication   Communication: No difficulties  Cognition Arousal/Alertness: Lethargic Behavior During Therapy: Flat affect Overall Cognitive Status: History of cognitive impairments - at baseline  General Comments: Pt opens eyes to name, follows commands appropriately. Daughter reports baseline dementia, normally oriented to place and people, not always date.      General Comments General comments (skin integrity, edema, etc.): Pt on RA with SpO2 98-100% during eval, BP 181/93- RN notified    Exercises     Assessment/Plan    PT Assessment Patient needs continued PT services  PT Problem List Decreased strength;Decreased activity tolerance;Decreased balance;Decreased mobility;Impaired sensation       PT Treatment Interventions DME instruction;Gait training;Functional mobility training;Therapeutic activities;Therapeutic exercise;Balance training;Neuromuscular re-education;Patient/family education    PT Goals (Current goals can be found in the Care Plan section)  Acute Rehab PT Goals Patient Stated Goal: return home with family to assist PT Goal Formulation: With family Time For Goal Achievement: 04/02/20 Potential to Achieve Goals: Good    Frequency Min 3X/week   Barriers to discharge        Co-evaluation               AM-PAC PT "6 Clicks" Mobility  Outcome Measure Help needed turning from your back to your side while in a flat bed without using bedrails?: A Little Help needed moving from lying on your back to sitting on the side of a flat bed without using bedrails?: A Little Help needed moving to and from a  bed to a chair (including a wheelchair)?: A Little Help needed standing up from a chair using your arms (e.g., wheelchair or bedside chair)?: A Little Help needed to walk in hospital room?: A Lot Help needed climbing 3-5 steps with a railing? : A Lot 6 Click Score: 16    End of Session   Activity Tolerance: Patient tolerated treatment well Patient left: in bed;with call bell/phone within reach;with family/visitor present Nurse Communication: Mobility status PT Visit Diagnosis: Unsteadiness on feet (R26.81);Other abnormalities of gait and mobility (R26.89);Muscle weakness (generalized) (M62.81)    Time: 3976-7341 PT Time Calculation (min) (ACUTE ONLY): 30 min   Charges:   PT Evaluation $PT Eval Moderate Complexity: 1 Mod PT Treatments $Therapeutic Activity: 8-22 mins         Tori Harsimran Westman PT, DPT 03/19/20, 11:45 AM

## 2020-03-19 NOTE — ED Provider Notes (Signed)
TIME SEEN: 12:22 AM  CHIEF COMPLAINT: Cough, generalized weakness  HPI: Patient is a 69 year old male with history of dementia, hypertension, CAD, seizures who presents to the emergency department with 1 day of nonproductive cough and generalized weakness.  Lives at home with his daughter who is here and provides most of the history.  She states that she noticed a cough today and tonight he seemed to have a hard time getting up and moving around.  No complaints of pain.  No vomiting or diarrhea.  No falls.  Normally ambulates without any assistive devices.  ROS: Level 5 caveat secondary to dementia  PAST MEDICAL HISTORY/PAST SURGICAL HISTORY:  Past Medical History:  Diagnosis Date  . Coronary artery disease   . Dementia (HCC)   . Depression   . Headache    "monthly" (02/16/2015)  . Heart disease   . Hypertension   . Memory loss   . Seizures (HCC)   . Stroke [facility])    "years ago" (02/16/2015)    MEDICATIONS:  Prior to Admission medications   Medication Sig Start Date End Date Taking? Authorizing Provider  aspirin 81 MG chewable tablet Chew 1 tablet (81 mg total) by mouth daily. 02/17/15   Rinaldo Cloud, MD  atorvastatin (LIPITOR) 20 MG tablet Take 20 mg by mouth daily.  05/08/17   [provider]  carbidopa-levodopa (SINEMET IR) 25-100 MG tablet Take 1 tablet by mouth 3 (three) times daily. 01/29/20   Levert Feinstein, MD  divalproex (DEPAKOTE ER) 500 MG 24 hr tablet Take 2 tablets (1,000 mg total) by mouth at bedtime. 01/29/20   Levert Feinstein, MD  levETIRAcetam (KEPPRA) 500 MG tablet Take 2 tablets (1,000 mg total) by mouth 2 (two) times daily. 01/29/20   Levert Feinstein, MD  lisinopril (PRINIVIL,ZESTRIL) 20 MG tablet Take 1 tablet (20 mg total) by mouth daily. 04/21/18   Luevenia Maxin, Mina A, PA-C  losartan (COZAAR) 25 MG tablet Take 25 mg by mouth daily. 12/09/18   [provider]  metoprolol succinate (TOPROL-XL) 25 MG 24 hr tablet Take 25 mg by mouth daily. 11/19/15   [provider]  risperiDONE (RISPERDAL) 1 MG tablet Take 1 tablet (1 mg total) by mouth at bedtime. 01/29/20   Levert Feinstein, MD    ALLERGIES:  Allergies  Allergen Reactions  . Aricept [Donepezil Hcl] Nausea Only  . Penicillins Nausea And Vomiting    Has patient had a PCN reaction causing immediate rash, facial/tongue/throat swelling, SOB or lightheadedness with hypotension: No  Has patient had a PCN reaction causing severe rash involving mucus membranes or skin necrosis: No  Has patient had a PCN reaction that required hospitalization: No  Has patient had a PCN reaction occurring within the last 10 years: No  If all of the above answers are "NO", then may proceed with Cephalosporin use.    SOCIAL HISTORY:  Social History   Tobacco Use  . Smoking status: Former Games developer  . Smokeless tobacco: Never Used  . Tobacco comment: quit many years ago  Substance Use Topics  . Alcohol use: Not Currently    Comment: 02/16/2015 "last alcohol was in ~ 2012"    FAMILY HISTORY: Family History  Problem Relation Age of Onset  . Diabetes Mother   . Hypertension Mother   . Stroke Father   . Cancer Sister     EXAM: BP (!) 166/81 (BP Location: Left Arm)   Pulse 99   Temp (!) 102.2 F (39 C) (Oral)   Resp 19  Ht 5\' 6"  (1.676 m)   Wt 65.3 kg   SpO2 99%   BMI 23.24 kg/m  CONSTITUTIONAL: Alert and oriented person and place, elderly, in no distress HEAD: Normocephalic EYES: Conjunctivae clear, pupils appear equal, EOM appear intact ENT: normal nose; moist mucous membranes NECK: Supple, normal ROM CARD: RRR; S1 and S2 appreciated; no murmurs, no clicks, no rubs, no gallops RESP: Normal chest excursion without splinting or tachypnea; breath sounds clear and equal bilaterally; no wheezes, no rhonchi, no rales, no hypoxia or respiratory distress, speaking full sentences ABD/GI: Normal bowel sounds; non-distended; soft, non-tender, no rebound, no guarding, no peritoneal signs, no  hepatosplenomegaly BACK:  The back appears normal EXT: Normal ROM in all joints; no deformity noted, no edema; no cyanosis no calf tenderness or calf swelling SKIN: Normal color for age and race; warm; no rash on exposed skin NEURO: Moves all extremities equally PSYCH: The patient's mood and manner are appropriate.   MEDICAL DECISION MAKING: Patient here with fever, cough, generalized weakness.  Differential includes pneumonia, COVID-19, influenza, other viral URI, UTI, dehydration, anemia, electrolyte derangement.  Will obtain labs, urine, chest x-ray and Covid swab.  Febrile here.  Will give Tylenol.  Does not appear dehydrated on exam.  Will hold fluids at this time until we have Covid testing back.  ED PROGRESS: Patient's Covid test is positive.  Labs reassuring.  He has had 2 normal high-sensitivity troponins.  Urine does not show any sign of infection.  Will discuss with hospitalist given patient is not even able to stand at the side of the bed here and normally is able to ambulate without assistive devices.  Daughter has been updated with plan.  4:09 AM Discussed patient's case with hospitalist, Dr. .  I have recommended admission and patient (and family if present) agree with this plan. Admitting physician will place admission orders.   I reviewed all nursing notes, vitals, pertinent previous records and reviewed/interpreted all EKGs, lab and urine results, imaging (as available).      EKG Interpretation  Date/Time:  Friday March 19 2020 00:00:48 EST Ventricular Rate:  99 PR Interval:    QRS Duration: 143 QT Interval:  368 QTC Calculation: 473 R Axis:   -90 Text Interpretation: Sinus rhythm Borderline prolonged PR interval RBBB and LAFB No significant change since last tracing Confirmed by 05-09-1999 [phone]) on 03/19/2020 12:28:24 AM         03/21/2020 was evaluated in Emergency Department on 03/19/2020 for the symptoms described in the history of  present illness. He was evaluated in the context of the global COVID-19 pandemic, which necessitated consideration that the patient might be at risk for infection with the SARS-CoV-2 virus that causes COVID-19. Institutional protocols and algorithms that pertain to the evaluation of patients at risk for COVID-19 are in a state of rapid change based on information released by regulatory bodies including the CDC and federal and state organizations. These policies and algorithms were followed during the patient's care in the ED.      Arvis Zwahlen, 03/21/2020, DO 03/19/20 [phone]

## 2020-03-19 NOTE — ED Notes (Addendum)
Pt had family member(daughter) in room with pt at shift change. Daughter is not dressed in full PPE. Pt daughter continues to come outside to the hall restroom wearing no PPE, no N95, only a surgical mask. RN aware.

## 2020-03-19 NOTE — ED Notes (Signed)
Pt given meal tray.

## 2020-03-20 DIAGNOSIS — U071 COVID-19: Secondary | ICD-10-CM | POA: Diagnosis not present

## 2020-03-20 LAB — COMPREHENSIVE METABOLIC PANEL
ALT: 28 U/L (ref 0–44)
AST: 48 U/L — ABNORMAL HIGH (ref 15–41)
Albumin: 3.9 g/dL (ref 3.5–5.0)
Alkaline Phosphatase: 63 U/L (ref 38–126)
Anion gap: 11 (ref 5–15)
BUN: 18 mg/dL (ref 8–23)
CO2: 25 mmol/L (ref 22–32)
Calcium: 9.2 mg/dL (ref 8.9–10.3)
Chloride: 100 mmol/L (ref 98–111)
Creatinine, Ser: 1.1 mg/dL (ref 0.61–1.24)
GFR, Estimated: >60 mL/min (ref >60)
Glucose, Bld: 116 mg/dL — ABNORMAL HIGH (ref 70–99)
Potassium: 4.4 mmol/L (ref 3.5–5.1)
Sodium: 136 mmol/L (ref 135–145)
Total Bilirubin: 0.5 mg/dL (ref 0.3–1.2)
Total Protein: 7.8 g/dL (ref 6.5–8.1)

## 2020-03-20 LAB — URINE CULTURE: Culture: NO GROWTH

## 2020-03-20 LAB — CBC WITH DIFFERENTIAL/PLATELET
Abs Immature Granulocytes: 0.01 10*3/uL (ref 0.00–0.07)
Basophils Absolute: 0 10*3/uL (ref 0.0–0.1)
Basophils Relative: 0 %
Eosinophils Absolute: 0 10*3/uL (ref 0.0–0.5)
Eosinophils Relative: 0 %
HCT: 45.6 % (ref 39.0–52.0)
Hemoglobin: 15.4 g/dL (ref 13.0–17.0)
Immature Granulocytes: 0 %
Lymphocytes Relative: 31 %
Lymphs Abs: 1.6 10*3/uL (ref 0.7–4.0)
MCH: 29.7 pg (ref 26.0–34.0)
MCHC: 33.8 g/dL (ref 30.0–36.0)
MCV: 88 fL (ref 80.0–100.0)
Monocytes Absolute: 0.7 10*3/uL (ref 0.1–1.0)
Monocytes Relative: 13 %
Neutro Abs: 3 10*3/uL (ref 1.7–7.7)
Neutrophils Relative %: 56 %
Platelets: 186 10*3/uL (ref 150–400)
RBC: 5.18 MIL/uL (ref 4.22–5.81)
RDW: 14.1 % (ref 11.5–15.5)
WBC: 5.2 10*3/uL (ref 4.0–10.5)
nRBC: 0 % (ref 0.0–0.2)

## 2020-03-20 LAB — C-REACTIVE PROTEIN: CRP: 1.4 mg/dL — ABNORMAL HIGH (ref <1.0)

## 2020-03-20 LAB — FERRITIN: Ferritin: 240 ng/mL (ref 24–336)

## 2020-03-20 MED ORDER — GUAIFENESIN ER 600 MG PO TB12
600.0000 mg | ORAL_TABLET | Freq: Two times a day (BID) | ORAL | Status: DC
  Administered 2020-03-20 – 2020-03-21 (×3): 600 mg via ORAL
  Filled 2020-03-20 (×3): qty 1

## 2020-03-20 NOTE — ED Notes (Signed)
RN made aware of BP 

## 2020-03-20 NOTE — Progress Notes (Addendum)
PROGRESS NOTE    Tyrone Noble  WJX:[id number] DOB: 30-Sep-1950 DOA: 03/18/2020 PCP: [facility]    Brief Narrative: Tyrone Noble is a 70 y.o. male with medical history significant for dementia, hypertension, CAD, seizures, Parkinson's disease who presents with 1 day history of nonproductive cough and generalized weakness. Lives at home with his daughter who is here and provides the history. She states that she noticed a cough today and as the day went on he seemed to get weaker.  By the evening of March 18, 2020 patient was unable to sit up on his own and not able to ambulate.  Daughter reports he normally can get around the house and is very talkative.  She reports that he has not been as talkative or alert as he normally is.  Cough has been nonproductive.  Patient did not appear short of breath she reports and he had no complaints of any chest pain or pressure.No complaints of pain. No vomiting or diarrhea. No falls. Normally ambulates without any assistive devices. He received both doses of the Moderna vaccine for Covid in the summer.  Not been exposed anyone with Covid that they know of and has had no sick contacts in the house.   ED Course: Tyrone Noble is found to be Covid positive he is hemodynamically stable and satting 97 to 99% on room air and not requiring supplemental oxygen.  He has significant weakness and is not able to even stand up at the bedside with assistance secondary to weakness.  Hospital service asked to evaluate and admit for further management  Assessment & Plan:   Principal Problem:   COVID-19 virus infection Active Problems:   Dementia without behavioral disturbance (HCC)   Parkinsonism (HCC)   Generalized weakness   Essential hypertension   Seizure disorder (HCC)  #1 COVID-19 virus infection-fortunately patient is able to maintain his saturation on room air. With his comorbidities Parkinson's disease and dementia and  seizure disorder he was started on remdesivir. Finish the course of remdesivir. Continue Decadron Chest x-ray shows no active disease. Continue supportive treatment Trend inflammatory markers Add vitamin C and zinc and D3 CRP is 1.4, lactic acid 1.5, white count 5.2  #2 history of essential hypertension on metoprolol and losartan blood pressure 111/77.  Decrease the dose of antihypertensives.  I have stopped his losartan, his creatinine has bumped up from 1.06-1.10.  Follow-up labs in a.m.  #3 history of Parkinson's disease on Sinemet  #4 seizure disorder continue Keppra and Depakote  #5 generalized weakness consult physical therapy.  At baseline he walks with a walker at home.  He lives at home with his daughter.  He was not able to stand up by the bedside with staff.  Discussed with his daughter patient lives at home with his daughter.  Will consult Occupational Therapy.  PT recommended skilled services/home with home PT.  Estimated body mass index is 23.24 kg/m as calculated from the following:   Height as of this encounter: 5\' 6"  (1.676 m).   Weight as of this encounter: 65.3 kg.  DVT prophylaxis: Lovenox Code Status: Full code  family Communication:  Disposition Plan:  Status is: Inpatient  Dispo: The patient is from: Home              Anticipated d/c is              Anticipated d/c date is: 3 days  Patient currently is not medically stable to d/c.  Consultants: None  Procedures: None Antimicrobials: None  Subjective: Patient is resting in bed with eyes closed not in any distress Not on oxygen overnight staff reported no events  Objective: Vitals:   03/20/20 0800 03/20/20 1010 03/20/20 1100 03/20/20 1200  BP: 128/73 (!) 155/91 (!) 119/91 111/77  Pulse: 67 78 68 66  Resp: 17  12 16   Temp:      TempSrc:      SpO2: 97%  99% 98%  Weight:      Height:        Intake/Output Summary (Last 24 hours) at 03/20/2020 1247 Last data filed at 03/20/2020  0415 Gross per 24 hour  Intake 1000 ml  Output 600 ml  Net 400 ml   Filed Weights   03/19/20 0004  Weight: 65.3 kg    Examination:  General exam: Appears calm and comfortable  Respiratory system: Diminished at the bases  to auscultation. Respiratory effort normal. Cardiovascular system: S1 & S2 heard, RRR. No JVD, murmurs, rubs, gallops or clicks. No pedal edema. Gastrointestinal system: Abdomen is nondistended, soft and nontender. No organomegaly or masses felt. Normal bowel sounds heard. Central nervous system: Awake moves all extremities  extremities no edema  skin: No rashes, lesions or ulcers Psychiatry: Unable to assess  Data Reviewed: I have personally reviewed following labs and imaging studies  CBC: Recent Labs  Lab 03/19/20 0027 03/19/20 0644 03/20/20 0613  WBC 6.1 5.5 5.2  NEUTROABS 4.1  --  3.0  HGB 14.9 14.2 15.4  HCT 45.5 43.0 45.6  MCV 88.9 90.0 88.0  PLT 188 168 186   Basic Metabolic Panel: Recent Labs  Lab 03/19/20 0027 03/19/20 0644 03/20/20 0613  NA 138  --  136  K 4.2  --  4.4  CL 102  --  100  CO2 25  --  25  GLUCOSE 100*  --  116*  BUN 17  --  18  CREATININE 1.22 1.06 1.10  CALCIUM 9.1  --  9.2   GFR: Estimated Creatinine Clearance: 56.4 mL/min (by C-G formula based on SCr of 1.1 mg/dL). Liver Function Tests: Recent Labs  Lab 03/19/20 0027 03/20/20 0613  AST 36 48*  ALT 36 28  ALKPHOS 72 63  BILITOT 0.5 0.5  PROT 7.7 7.8  ALBUMIN 4.1 3.9   No results for input(s): LIPASE, AMYLASE in the last 168 hours. No results for input(s): AMMONIA in the last 168 hours. Coagulation Profile: Recent Labs  Lab 03/19/20 0027  INR 1.0   Cardiac Enzymes: No results for input(s): CKTOTAL, CKMB, CKMBINDEX, TROPONINI in the last 168 hours. BNP (last 3 results) No results for input(s): PROBNP in the last 8760 hours. HbA1C: No results for input(s): HGBA1C in the last 72 hours. CBG: No results for input(s): GLUCAP in the last 168  hours. Lipid Profile: No results for input(s): CHOL, HDL, LDLCALC, TRIG, CHOLHDL, LDLDIRECT in the last 72 hours. Thyroid Function Tests: No results for input(s): TSH, T4TOTAL, FREET4, T3FREE, THYROIDAB in the last 72 hours. Anemia Panel: Recent Labs    03/20/20 05/18/20  FERRITIN 240   Sepsis Labs: Recent Labs  Lab 03/19/20 0027  LATICACIDVEN 1.5    Recent Results (from the past 240 hour(s))  Culture, blood (Routine x 2)     Status: None (Preliminary result)   Collection Time: 03/19/20 12:27 AM   Specimen: BLOOD  Result Value Ref Range Status   Specimen Description   Final  BLOOD BLOOD LEFT FOREARM Performed at [facility] [address] [place], [place] [address]    Special Requests   Final    BOTTLES DRAWN AEROBIC AND ANAEROBIC Blood Culture adequate volume Performed at [facility] [address], [place], [place] [address]    Culture   Final    NO GROWTH 1 DAY Performed at [facility], [place] [address], [place], [place] [address]    Report Status PENDING  Incomplete  Resp Panel by RT-PCR (Flu A&B, Covid) Nasopharyngeal Swab     Status: Abnormal   Collection Time: 03/19/20 12:28 AM   Specimen: Nasopharyngeal Swab; Nasopharyngeal(NP) swabs in vial transport medium  Result Value Ref Range Status   SARS Coronavirus 2 by RT PCR POSITIVE (A) NEGATIVE Final    Comment: CRITICAL RESULT CALLED TO, READ BACK BY AND VERIFIED WITH: RN SARA AT 0345 03/19/20 CRUICKSHANK A (NOTE) SARS-CoV-2 target nucleic acids are DETECTED.  The SARS-CoV-2 RNA is generally detectable in upper respiratory specimens during the acute phase of infection. Positive results are indicative of the presence of the identified virus, but do not rule out bacterial infection or co-infection with other pathogens not detected by the test. Clinical correlation with patient history and other diagnostic information is necessary to determine patient infection  status. The expected result is Negative.  Fact Sheet for Patients: EntrepreneurPulse.com.au  Fact Sheet for Healthcare Providers: IncredibleEmployment.be  This test is not yet approved or cleared by the [place] FDA and  has been authorized for detection and/or diagnosis of SARS-CoV-2 by FDA under an Emergency Use Authorization (EUA).  This EUA will remain in effect (meaning th is test can be used) for the duration of  the COVID-19 declaration under Section 564(b)(1) of the Act, 21 U.S.C. section 360bbb-3(b)(1), unless the authorization is terminated or revoked sooner.     Influenza A by PCR NEGATIVE NEGATIVE Final   Influenza B by PCR NEGATIVE NEGATIVE Final    Comment: (NOTE) The Xpert Xpress SARS-CoV-2/FLU/RSV plus assay is intended as an aid in the diagnosis of influenza from Nasopharyngeal swab specimens and should not be used as a sole basis for treatment. Nasal washings and aspirates are unacceptable for Xpert Xpress SARS-CoV-2/FLU/RSV testing.  Fact Sheet for Patients: EntrepreneurPulse.com.au  Fact Sheet for Healthcare Providers: IncredibleEmployment.be  This test is not yet approved or cleared by the [place] FDA and has been authorized for detection and/or diagnosis of SARS-CoV-2 by FDA under an Emergency Use Authorization (EUA). This EUA will remain in effect (meaning this test can be used) for the duration of the COVID-19 declaration under Section 564(b)(1) of the Act, 21 U.S.C. section 360bbb-3(b)(1), unless the authorization is terminated or revoked.  Performed at [facility], [place] [address], [place], [place] [address]   Culture, blood (Routine x 2)     Status: None (Preliminary result)   Collection Time: 03/19/20 12:31 AM   Specimen: BLOOD  Result Value Ref Range Status   Specimen Description   Final    BLOOD LEFT ANTECUBITAL Performed at [facility] [address], [place], [place] [address]    Special Requests   Final    BOTTLES DRAWN AEROBIC AND ANAEROBIC Blood Culture adequate volume Performed at [facility] [address], [place], [place] [address]    Culture   Final    NO GROWTH 1 DAY Performed at [facility], [place] [address], [place], [place] [address]  Report Status PENDING  Incomplete  Urine culture     Status: None   Collection Time: 03/19/20  3:34 AM   Specimen: Urine, Clean Catch  Result Value Ref Range Status   Specimen Description   Final    URINE, CLEAN CATCH Performed at [facility], [address] [place], [place] [address]    Special Requests   Final    NONE Performed at [facility], [address] [place], [place] [address]    Culture   Final    NO GROWTH Performed at [facility], [address] [place], [place] [address]    Report Status 03/20/2020 FINAL  Final         Radiology Studies: DG Chest Portable 1 View  Result Date: 03/19/2020 CLINICAL DATA:  Cough, fever EXAM: PORTABLE CHEST 1 VIEW COMPARISON:  02/25/2019 FINDINGS: Lungs volumes are small, but are symmetric and are clear. No pneumothorax or pleural effusion. Cardiac size within normal limits. Pulmonary vascularity is normal. Osseous structures are age-appropriate. No acute bone abnormality. IMPRESSION: No active disease. Electronically Signed   By: Helyn Numbers MD   On: 03/19/2020 00:59        Scheduled Meds: . aspirin  81 mg Oral Daily  . atorvastatin  20 mg Oral Daily  . carbidopa-levodopa  1 tablet Oral TID  . dexamethasone  6 mg Oral Q12H  . divalproex  1,000 mg Oral QHS  . enoxaparin (LOVENOX) injection  40 mg Subcutaneous Q24H  . levETIRAcetam  1,000 mg Oral BID  . losartan  25 mg Oral Daily  . metoprolol succinate  25 mg Oral Daily  . risperiDONE  1 mg Oral QHS   Continuous Infusions: . lactated ringers Stopped  (03/20/20 0103)  . remdesivir 100 mg in NS 100 mL 100 mg (03/20/20 1019)     LOS: 1 day   Alwyn Ren, MD 03/20/2020, 12:47 PM

## 2020-03-20 NOTE — ED Notes (Signed)
Pt found in doorway of room holding a gown around his waist. Large puddle of urine on the floor as well as blood on floor, bedrails, and instruments. Both IVs pulled out. With Viri, NT and pharmacy tech assistance, cleaned up urine and blood. Replaced monitors, gowns, primofit.

## 2020-03-20 NOTE — ED Notes (Signed)
Unable to get pt to stand up to ambulate the pt on rm air. The pt is at 100% on rm air in the bed

## 2020-03-21 DIAGNOSIS — U071 COVID-19: Principal | ICD-10-CM

## 2020-03-21 LAB — COMPREHENSIVE METABOLIC PANEL
ALT: 29 U/L (ref 0–44)
AST: 39 U/L (ref 15–41)
Albumin: 4.1 g/dL (ref 3.5–5.0)
Alkaline Phosphatase: 67 U/L (ref 38–126)
Anion gap: 13 (ref 5–15)
BUN: 27 mg/dL — ABNORMAL HIGH (ref 8–23)
CO2: 26 mmol/L (ref 22–32)
Calcium: 9.3 mg/dL (ref 8.9–10.3)
Chloride: 100 mmol/L (ref 98–111)
Creatinine, Ser: 1.26 mg/dL — ABNORMAL HIGH (ref 0.61–1.24)
GFR, Estimated: >60 mL/min (ref >60)
Glucose, Bld: 119 mg/dL — ABNORMAL HIGH (ref 70–99)
Potassium: 4.1 mmol/L (ref 3.5–5.1)
Sodium: 139 mmol/L (ref 135–145)
Total Bilirubin: 0.3 mg/dL (ref 0.3–1.2)
Total Protein: 8.1 g/dL (ref 6.5–8.1)

## 2020-03-21 LAB — CBC WITH DIFFERENTIAL/PLATELET
Abs Immature Granulocytes: 0.01 10*3/uL (ref 0.00–0.07)
Basophils Absolute: 0 10*3/uL (ref 0.0–0.1)
Basophils Relative: 0 %
Eosinophils Absolute: 0 10*3/uL (ref 0.0–0.5)
Eosinophils Relative: 0 %
HCT: 46.9 % (ref 39.0–52.0)
Hemoglobin: 15.5 g/dL (ref 13.0–17.0)
Immature Granulocytes: 0 %
Lymphocytes Relative: 34 %
Lymphs Abs: 1.9 10*3/uL (ref 0.7–4.0)
MCH: 29.1 pg (ref 26.0–34.0)
MCHC: 33 g/dL (ref 30.0–36.0)
MCV: 88 fL (ref 80.0–100.0)
Monocytes Absolute: 0.5 10*3/uL (ref 0.1–1.0)
Monocytes Relative: 10 %
Neutro Abs: 3.1 10*3/uL (ref 1.7–7.7)
Neutrophils Relative %: 56 %
Platelets: 204 10*3/uL (ref 150–400)
RBC: 5.33 MIL/uL (ref 4.22–5.81)
RDW: 14 % (ref 11.5–15.5)
WBC: 5.5 10*3/uL (ref 4.0–10.5)
nRBC: 0 % (ref 0.0–0.2)

## 2020-03-21 LAB — FERRITIN: Ferritin: 301 ng/mL (ref 24–336)

## 2020-03-21 LAB — C-REACTIVE PROTEIN: CRP: 0.8 mg/dL (ref <1.0)

## 2020-03-21 MED ORDER — DEXAMETHASONE 4 MG PO TABS: 6.0000 mg | ORAL_TABLET | Freq: Every day | ORAL | Status: DC

## 2020-03-21 MED ORDER — GUAIFENESIN ER 600 MG PO TB12: 600.0000 mg | ORAL_TABLET | Freq: Two times a day (BID) | ORAL | 0 refills | Status: DC

## 2020-03-21 MED ORDER — DEXAMETHASONE 6 MG PO TABS: 6.0000 mg | ORAL_TABLET | Freq: Every day | ORAL | 0 refills | Status: AC

## 2020-03-21 NOTE — Discharge Instructions (Signed)
You are scheduled for a Remdesivir infusion on 03/22/20 and 03/23/20 at 3PM. Please come to [address] [facility], you will see a COVID infusion banner by the road.  Enter there and turn left.  There are marked spaces for Infusion. Call the number on the sign or [phone] and someone will come out and bring you inside. If someone is driving you please come to the same area and call the number and someone will come outside to get you. Thank you!

## 2020-03-21 NOTE — Evaluation (Signed)
Occupational Therapy Evaluation Patient Details Name: Tyrone Noble MRN: [id number] DOB: 1950-04-25 Today's Date: 03/21/2020    History of Present Illness Tyrone Noble is a 70 y.o. male with medical history significant for dementia, HTN, CAD, seizures, Parkinson's disease with c/o nonproductive cough and generalized weakness.   Clinical Impression   Tyrone Noble is a 70 year old man with above medical history who presents with generalized weakness, decreased activity tolerance and balance. On evaluation patient mod assist to transfer into sitting on stretcher and min guard to stand and ambulate short distance in room with RW. Patient needing more assistance with ADLs and difficulty reaching feet to don socks today. Patient demonstrates ability to perform UB ADLs with set up and verbal cues and mod assist for LB ADLs. Patient will benefit from skilled OT services to improve deficits and learn compensatory strategies in order to improve functional abilities. Would recommend HH OT and PT at discharge as patient will more likely do better in familiar environment as long as patient has initial 24/7 assistance at home.    Follow Up Recommendations  Home health OT;Supervision/Assistance - 24 hour    Equipment Recommendations  Tub/shower seat    Recommendations for Other Services       Precautions / Restrictions Precautions Precautions: Fall Restrictions Weight Bearing Restrictions: No      Mobility Bed Mobility Overal bed mobility: Needs Assistance Bed Mobility: Supine to Sit     Supine to sit: HOB elevated;Mod assist     General bed mobility comments: Mod assist for transfer into sitting at side of bed this morning - assistace to guide LEs and lift trunk.    Transfers Overall transfer level: Needs assistance Equipment used: Rolling walker (2 wheeled) Transfers: Sit to/from [employer] Sit to Stand: Min guard;From elevated surface Stand pivot  transfers: Min guard       General transfer comment: MIn guard to ambulate with RW around stretcher and turn and sit in recliner. Verbal cues to sequence task and assistance for lines/leads. Patient at times lifting walker.    Balance   Sitting-balance support: Bilateral upper extremity supported;No upper extremity supported Sitting balance-Leahy Scale: Good     Standing balance support: During functional activity Standing balance-Leahy Scale: Fair Standing balance comment: At times lifting walker off of ground. Can take hands off of walker                           ADL either performed or assessed with clinical judgement   ADL Overall ADL's : Needs assistance/impaired Eating/Feeding: Set up   Grooming: Set up;Cueing for sequencing   Upper Body Bathing: Set up;Cueing for sequencing   Lower Body Bathing: Moderate assistance;Sit to/from stand;Cueing for sequencing   Upper Body Dressing : Minimal assistance;Sitting;Cueing for sequencing   Lower Body Dressing: Moderate assistance;Sit to/from stand Lower Body Dressing Details (indicate cue type and reason): Unable to reach feet today despite attempt. Toilet Transfer: Min Paediatric nurse and Hygiene: Sit to/from stand;Moderate assistance               Vision   Vision Assessment?: No apparent visual deficits     Perception     Praxis      Pertinent Vitals/Pain Pain Assessment: No/denies pain     Hand Dominance Right   Extremity/Trunk Assessment Upper Extremity Assessment Upper Extremity Assessment: Generalized weakness   Lower Extremity Assessment Lower Extremity Assessment: Defer to PT  evaluation   Cervical / Trunk Assessment Cervical / Trunk Assessment: Normal   Communication Communication Communication: No difficulties   Cognition Arousal/Alertness: Awake/alert Behavior During Therapy: Flat affect Overall Cognitive Status: History of cognitive  impairments - at baseline                                 General Comments: Pt alert to self and knows he is in the hospital.  Follows commands appropriately. Per PT note Daughter reports baseline dementia, normally oriented to place and people, not always date.   General Comments       Exercises     Shoulder Instructions      Home Living Family/patient expects to be discharged to:: Private residence Living Arrangements: Children Available Help at Discharge: Family;Available PRN/intermittently Type of Home: House Home Access: Stairs to enter [employer] of Steps: 1 Entrance Stairs-Rails: None Home Layout: Two level;Able to live on main level with bedroom/bathroom     Bathroom Shower/Tub: Tub/shower unit         Home Equipment: None   Additional Comments: Pt goes to [employer] [facility] day[facility] center Monday-Friday. Daughter reports no falls.      Prior Functioning/Environment Level of Independence: Independent        Comments: Pt's daughter reports pt independent with ambulation, bathing and dressing. Daughter completes cooking and cleaning.        OT Problem List: Decreased strength;Decreased activity tolerance;Decreased safety awareness;Decreased knowledge of use of DME or AE;Decreased cognition      OT Treatment/Interventions: Self-care/ADL training;Therapeutic exercise;DME and/or AE instruction;Patient/family education;Balance training;Therapeutic activities    OT Goals(Current goals can be found in the care plan section) Acute Rehab OT Goals Patient Stated Goal: Did not state OT Goal Formulation: Patient unable to participate in goal setting Time For Goal Achievement: 04/04/20 Potential to Achieve Goals: Good  OT Frequency: Min 2X/week   Barriers to D/C:            Co-evaluation              AM-PAC OT "6 Clicks" Daily Activity     Outcome Measure Help from another person eating meals?: A Little Help from another person  taking care of personal grooming?: A Little Help from another person toileting, which includes using toliet, bedpan, or urinal?: A Little Help from another person bathing (including washing, rinsing, drying)?: A Lot Help from another person to put on and taking off regular upper body clothing?: A Little Help from another person to put on and taking off regular lower body clothing?: A Lot 6 Click Score: 16   End of Session Equipment Utilized During Treatment: Rolling walker;Gait belt Nurse Communication: Mobility status  Activity Tolerance: Patient tolerated treatment well Patient left: in chair;with call bell/phone within reach;with chair alarm set  OT Visit Diagnosis: Unsteadiness on feet (R26.81);Muscle weakness (generalized) (M62.81)                Time: 1610-9604 OT Time Calculation (min): 19 min Charges:  OT General Charges $OT Visit: 1 Visit OT Evaluation $OT Eval Moderate Complexity: 1 Mod  Koralee Wedeking, OTR/L Acute Care Rehab Services  Office [phone] Pager: [phone]   Kelli Churn 03/21/2020, 11:18 AM

## 2020-03-21 NOTE — Progress Notes (Signed)
The patient is scheduled for a Remdesivir infusion on 03/22/20 and 03/23/20 at 3PM. Have the patient come to [address] [facility], they will see a COVID infusion banner by the road.  Enter there and turn left. There are marked spaces for Infusion.  Call the number on the sign or [phone] and someone will come out and bring them inside.  If someone is driving them, have them come to the same area and call the number and someone will come outside to get them. Thank you!

## 2020-03-22 ENCOUNTER — Ambulatory Visit (HOSPITAL_COMMUNITY)
Admit: 2020-03-22 | Discharge: 2020-03-22 | Disposition: A | Discharge status: Home / Self Care | Payer: Medicare HMO | Source: Ambulatory Visit | Attending: Pulmonary Disease | Admitting: Pulmonary Disease

## 2020-03-22 ENCOUNTER — Other Ambulatory Visit (HOSPITAL_COMMUNITY): Payer: Self-pay | Admitting: Pharmacy Technician

## 2020-03-22 DIAGNOSIS — J1282 Pneumonia due to coronavirus disease 2019: Secondary | ICD-10-CM | POA: Diagnosis not present

## 2020-03-22 DIAGNOSIS — U071 COVID-19: Secondary | ICD-10-CM | POA: Insufficient documentation

## 2020-03-22 MED ORDER — ALBUTEROL SULFATE HFA 108 (90 BASE) MCG/ACT IN AERS: 2.0000 | INHALATION_SPRAY | Freq: Once | RESPIRATORY_TRACT | Status: DC | PRN

## 2020-03-22 MED ORDER — SODIUM CHLORIDE 0.9 % IV SOLN
100.0000 mg | Freq: Once | INTRAVENOUS | Status: AC
  Administered 2020-03-22: 100 mg via INTRAVENOUS

## 2020-03-22 MED ORDER — EPINEPHRINE 0.3 MG/0.3ML IJ SOAJ: 0.3000 mg | Freq: Once | INTRAMUSCULAR | Status: DC | PRN

## 2020-03-22 MED ORDER — METHYLPREDNISOLONE SODIUM SUCC 125 MG IJ SOLR: 125.0000 mg | Freq: Once | INTRAMUSCULAR | Status: DC | PRN

## 2020-03-22 MED ORDER — FAMOTIDINE IN NACL 20-0.9 MG/50ML-% IV SOLN: 20.0000 mg | Freq: Once | INTRAVENOUS | Status: DC | PRN

## 2020-03-22 MED ORDER — SODIUM CHLORIDE 0.9 % IV SOLN: INTRAVENOUS | Status: DC | PRN

## 2020-03-22 MED ORDER — DIPHENHYDRAMINE HCL 50 MG/ML IJ SOLN: 50.0000 mg | Freq: Once | INTRAMUSCULAR | Status: DC | PRN

## 2020-03-22 NOTE — Progress Notes (Signed)
Patient reviewed Fact Sheet for Patients, Parents, and Caregivers for Emergency Use Authorization (EUA) of Remdesivir for the Treatment of Coronavirus. Patient also reviewed and is agreeable to the estimated cost of treatment. Patient is agreeable to proceed.   

## 2020-03-22 NOTE — Discharge Instructions (Signed)
What types of side effects do monoclonal antibody drugs cause?  Common side effects  In general, the more common side effects caused by monoclonal antibody drugs include: . Allergic reactions, such as hives or itching . Flu-like signs and symptoms, including chills, fatigue, fever, and muscle aches and pains . Nausea, vomiting . Diarrhea . Skin rashes . Low blood pressure   The CDC is recommending patients who receive monoclonal antibody treatments wait at least 90 days before being vaccinated.  Currently, there are no data on the safety and efficacy of mRNA COVID-19 vaccines in persons who received monoclonal antibodies or convalescent plasma as part of COVID-19 treatment. Based on the estimated half-life of such therapies as well as evidence suggesting that reinfection is uncommon in the 90 days after initial infection, vaccination should be deferred for at least 90 days, as a precautionary measure until additional information becomes available, to avoid interference of the antibody treatment with vaccine-induced immune responses.  If you have any questions or concerns after the infusion please call the Advanced Practice Provider on call at [phone]. This number is ONLY intended for your use regarding questions or concerns about the infusion post-treatment side-effects.  Please do not provide this number to others for use. For return to work notes please contact your primary care provider.   If someone you know is interested in receiving treatment please have them call the COVID hotline at [phone].   

## 2020-03-22 NOTE — Progress Notes (Signed)
  Diagnosis: COVID-19  Physician:Dr Wright   Procedure: [facility] Med: remdesivir infusion - Provided patient with remdesivir fact sheet for patients, parents and caregivers prior to infusion.  Complications: No immediate complications noted.  Discharge: Discharged home   Tyrone Noble W 03/22/2020  

## 2020-03-23 ENCOUNTER — Ambulatory Visit (HOSPITAL_COMMUNITY)
Admit: 2020-03-23 | Discharge: 2020-03-23 | Disposition: A | Discharge status: Home / Self Care | Payer: Medicare HMO | Attending: Pulmonary Disease | Admitting: Pulmonary Disease

## 2020-03-23 DIAGNOSIS — J1282 Pneumonia due to coronavirus disease 2019: Secondary | ICD-10-CM | POA: Diagnosis not present

## 2020-03-23 DIAGNOSIS — U071 COVID-19: Secondary | ICD-10-CM | POA: Diagnosis not present

## 2020-03-23 MED ORDER — SODIUM CHLORIDE 0.9 % IV SOLN
100.0000 mg | Freq: Once | INTRAVENOUS | Status: AC
  Administered 2020-03-23: 100 mg via INTRAVENOUS

## 2020-03-23 MED ORDER — METHYLPREDNISOLONE SODIUM SUCC 125 MG IJ SOLR: 125.0000 mg | Freq: Once | INTRAMUSCULAR | Status: DC | PRN

## 2020-03-23 MED ORDER — DIPHENHYDRAMINE HCL 50 MG/ML IJ SOLN: 50.0000 mg | Freq: Once | INTRAMUSCULAR | Status: DC | PRN

## 2020-03-23 MED ORDER — ALBUTEROL SULFATE HFA 108 (90 BASE) MCG/ACT IN AERS: 2.0000 | INHALATION_SPRAY | Freq: Once | RESPIRATORY_TRACT | Status: DC | PRN

## 2020-03-23 MED ORDER — SODIUM CHLORIDE 0.9 % IV SOLN: INTRAVENOUS | Status: DC | PRN

## 2020-03-23 MED ORDER — FAMOTIDINE IN NACL 20-0.9 MG/50ML-% IV SOLN: 20.0000 mg | Freq: Once | INTRAVENOUS | Status: DC | PRN

## 2020-03-23 MED ORDER — EPINEPHRINE 0.3 MG/0.3ML IJ SOAJ: 0.3000 mg | Freq: Once | INTRAMUSCULAR | Status: DC | PRN

## 2020-03-23 NOTE — Progress Notes (Signed)
  Diagnosis: COVID-19  Physician: Dr. Delford Field  Procedure: [facility] Med: remdesivir infusion - Provided patient with remdesivir fact sheet for patients, parents and caregivers prior to infusion.  Complications: No immediate complications noted.  Discharge: Discharged home   Tyrone Noble 03/23/2020

## 2020-03-23 NOTE — Discharge Instructions (Signed)
10 Things You Can Do to Manage Your COVID-19 Symptoms at Home If you have possible or confirmed COVID-19: 1. Stay home from work and school. And stay away from other public places. If you must go out, avoid using any kind of public transportation, ridesharing, or taxis. 2. Monitor your symptoms carefully. If your symptoms get worse, call your healthcare provider immediately. 3. Get rest and stay hydrated. 4. If you have a medical appointment, call the healthcare provider ahead of time and tell them that you have or may have COVID-19. 5. For medical emergencies, call 911 and notify the dispatch personnel that you have or may have COVID-19. 6. Cover your cough and sneezes with a tissue or use the inside of your elbow. 7. Wash your hands often with soap and water for at least 20 seconds or clean your hands with an alcohol-based hand sanitizer that contains at least 60% alcohol. 8. As much as possible, stay in a specific room and away from other people in your home. Also, you should use a separate bathroom, if available. If you need to be around other people in or outside of the home, wear a mask. 9. Avoid sharing personal items with other people in your household, like dishes, towels, and bedding. 10. Clean all surfaces that are touched often, like counters, tabletops, and doorknobs. Use household cleaning sprays or wipes according to the label instructions. cdc.gov/coronavirus 09/18/2018 This information is not intended to replace advice given to you by your health care provider. Make sure you discuss any questions you have with your health care provider. Document Revised: 02/20/2019 Document Reviewed: 02/20/2019 Elsevier Patient Education  2020 [employer]  

## 2020-03-23 NOTE — Progress Notes (Signed)
Patient reviewed Fact Sheet for Patients, Parents, and Caregivers for Emergency Use Authorization (EUA) of Remdesivir for the Treatment of Coronavirus. Patient also reviewed and is agreeable to the estimated cost of treatment. Patient is agreeable to proceed.   

## 2020-03-24 LAB — CULTURE, BLOOD (ROUTINE X 2)
Culture: NO GROWTH
Culture: NO GROWTH
Special Requests: ADEQUATE
Special Requests: ADEQUATE

## 2020-03-24 NOTE — Discharge Summary (Signed)
[facility] Discharge Summary   Patient: Tyrone Noble OXB:[id number]  PCP: [facility]  Date of admission: 03/18/2020   Date of discharge: 03/21/2020     Discharge Diagnoses:  Principal Problem:   COVID-19 virus infection Active Problems:   Dementia without behavioral disturbance (HCC)   Parkinsonism (HCC)   Generalized weakness   Essential hypertension   Seizure disorder (HCC)   Admitted From: home Disposition:  Home   Recommendations for Outpatient Follow-up:  1. PCP: please follow up in 1 week, remdesivir outpatient 2. Follow up LABS/TEST:  none   Follow-up Information    [facility]. Schedule an appointment as soon as possible for a visit in 1 week(s).   Specialty: Family Medicine Contact information: [address] [place] [address] [phone]              Discharge Instructions    Diet - low sodium heart healthy   Complete by: As directed    Discharge instructions   Complete by: As directed    It is important that you read the given instructions as well as go over your medication list with RN to help you understand your care after this hospitalization.  Please follow-up with PCP in 1-2 weeks.  Please note that NO REFILLS for any discharge medications will be authorized once you are discharged, as it is imperative that you return to your primary care physician (or establish a relationship with a primary care physician if you do not have one) for your aftercare needs so that they can reassess your need for medications and monitor your lab values.  Please request your primary care physician to go over all Hospital Tests and Procedure/Radiological results at the follow up. Please get all Hospital records sent to your PCP by signing hospital release before you go home.   Do not take more than prescribed Pain, Sleep and Anxiety Medications.  You were cared for by a hospitalist during  your hospital stay. If you have any questions about your discharge medications or the care you received while you were in the hospital after you are discharged, you can call the unit you were admitted to and ask to speak with the hospitalist who took care of you. Ask for Hospitalist on call if the hospitalist that took care of you is not available. Send any correspondence to office fax at [phone].   Once you are discharged, your primary care physician will handle any further medical issues.  You Must read complete instructions/literature along with all the possible adverse reactions/side effects for all the Medicines you take and that have been prescribed to you. Take any new Medicines after you have completely understood and accept all the possible adverse reactions/side effects.  If you have smoked or chewed Tobacco in the last 2 yrs please STOP smoking   Increase activity slowly   Complete by: As directed       Diet recommendation: Cardiac diet  Activity: The patient is advised to gradually reintroduce usual activities, as tolerated  Discharge Condition: stable  Code Status: Full code   History of present illness: As per the H and P dictated on admission, " Tyrone Noble is a 70 y.o. male with medical history significant for dementia, hypertension, CAD, seizures, Parkinson's disease who presents with 1 day history of nonproductive cough and generalized weakness. Lives at home with his daughter who is here and provides the history. She states that she noticed  a cough today and as the day went on he seemed to get weaker.  By the evening of March 18, 2020 patient was unable to sit up on his own and not able to ambulate.  Daughter reports he normally can get around the house and is very talkative.  She reports that he has not been as talkative or alert as he normally is.  Cough has been nonproductive.  Patient did not appear short of breath she reports and he had no complaints of any  chest pain or pressure.No complaints of pain. No vomiting or diarrhea. No falls. Normally ambulates without any assistive devices. He received both doses of the Moderna vaccine for Covid in the summer.  Not been exposed anyone with Covid that they know of and has had no sick contacts in the house. "  Hospital Course:  Summary of his active problems in the hospital is as following. COVID-19 virus infection fortunately patient is able to maintain his saturation on room air. With his comorbidities Parkinson's disease and dementia and seizure disorder he was started on remdesivir. Finish the course of remdesivir. Continue Decadron Chest x-ray shows no active disease. Continue supportive treatment Add vitamin C and zinc and D3 CRP is 1.4, lactic acid 1.5, white count 5.2  history of essential hypertension on metoprolol and losartan  History of Parkinson's disease on Sinemet  seizure disorder continue Keppra and Depakote  generalized weakness consult physical therapy. At baseline he walks with a walker at home.  He lives at home with his daughter.  He was not able to stand up by the bedside with staff.  Discussed with his daughter patient lives at home with his daughter.  Will consult Occupational Therapy.  PT recommended skilled services/home with home PT.  Patient was seen by physical therapy, who recommended Home Health,  On the day of the discharge the patient's vitals were stable, and no other acute medical condition were reported by patient. The patient was felt safe to be discharge at Home with Home health.  Consultants: none Procedures: none  DISCHARGE MEDICATION: Allergies as of 03/21/2020      Reactions   Aricept [donepezil Hcl] Nausea Only   Penicillins Nausea And Vomiting   Has patient had a PCN reaction causing immediate rash, facial/tongue/throat swelling, SOB or lightheadedness with hypotension: No  Has patient had a PCN reaction causing severe rash involving  mucus membranes or skin necrosis: No  Has patient had a PCN reaction that required hospitalization: No  Has patient had a PCN reaction occurring within the last 10 years: No  If all of the above answers are "NO", then may proceed with Cephalosporin use.      Medication List    STOP taking these medications   lisinopril 20 MG tablet Commonly known as: ZESTRIL   losartan 100 MG tablet Commonly known as: COZAAR     TAKE these medications   aspirin 81 MG chewable tablet Chew 1 tablet (81 mg total) by mouth daily.   atorvastatin 20 MG tablet Commonly known as: LIPITOR Take 20 mg by mouth daily.   carbidopa-levodopa 25-100 MG tablet Commonly known as: SINEMET IR Take 1 tablet by mouth 3 (three) times daily.   dexamethasone 6 MG tablet Commonly known as: DECADRON Take 1 tablet (6 mg total) by mouth daily for 8 days.   divalproex 500 MG 24 hr tablet Commonly known as: Depakote ER Take 2 tablets (1,000 mg total) by mouth at bedtime.   guaiFENesin 600 MG 12  hr tablet Commonly known as: MUCINEX Take 1 tablet (600 mg total) by mouth 2 (two) times daily.   levETIRAcetam 500 MG tablet Commonly known as: Keppra Take 2 tablets (1,000 mg total) by mouth 2 (two) times daily.   metoprolol succinate 25 MG 24 hr tablet Commonly known as: TOPROL-XL Take 25 mg by mouth daily.   multivitamin with minerals Tabs tablet Take 1 tablet by mouth daily.   risperiDONE 1 MG tablet Commonly known as: RISPERDAL Take 1 tablet (1 mg total) by mouth at bedtime.      Discharge Exam: Filed Weights   03/19/20 0004  Weight: 65.3 kg   Vitals:   03/21/20 1430 03/21/20 1517  BP: 122/79   Pulse: 65   Resp: 15   Temp:  98.4 F (36.9 C)  SpO2: 98%    General: Appear in no distress, no Rash; Oral Mucosa Clear, moist. no Abnormal Neck Mass Or lumps, Conjunctiva normal  Cardiovascular: S1 and S2 Present, no Murmur Respiratory: good respiratory effort, Bilateral Air entry present and bilateral   Crackles, no wheezes Abdomen: Bowel Sound present, Soft and no tenderness Extremities: no Pedal edema Neurology: alert and oriented to time, place, and person affect appropriate. no new focal deficit  The results of significant diagnostics from this hospitalization (including imaging, microbiology, ancillary and laboratory) are listed below for reference.    Significant Diagnostic Studies: DG Chest Portable 1 View  Result Date: 03/19/2020 CLINICAL DATA:  Cough, fever EXAM: PORTABLE CHEST 1 VIEW COMPARISON:  02/25/2019 FINDINGS: Lungs volumes are small, but are symmetric and are clear. No pneumothorax or pleural effusion. Cardiac size within normal limits. Pulmonary vascularity is normal. Osseous structures are age-appropriate. No acute bone abnormality. IMPRESSION: No active disease. Electronically Signed   By: Helyn Numbers MD   On: 03/19/2020 00:59    Microbiology: Recent Results (from the past 240 hour(s))  Culture, blood (Routine x 2)     Status: None   Collection Time: 03/19/20 12:27 AM   Specimen: BLOOD  Result Value Ref Range Status   Specimen Description   Final    BLOOD BLOOD LEFT FOREARM Performed at [facility], [address], [place], [place] [address]    Special Requests   Final    BOTTLES DRAWN AEROBIC AND ANAEROBIC Blood Culture adequate volume Performed at [facility], [address], [place], [place] [address]    Culture   Final    NO GROWTH 5 DAYS Performed at [facility], [address], [place], [place] [address]    Report Status 03/24/2020 FINAL  Final  Resp Panel by RT-PCR (Flu A&B, Covid) Nasopharyngeal Swab     Status: Abnormal   Collection Time: 03/19/20 12:28 AM   Specimen: Nasopharyngeal Swab; Nasopharyngeal(NP) swabs in vial transport medium  Result Value Ref Range Status   SARS Coronavirus 2 by RT PCR POSITIVE (A) NEGATIVE Final    Comment: CRITICAL RESULT CALLED TO, READ BACK BY AND VERIFIED  WITH: RN SARA AT 0345 03/19/20 CRUICKSHANK A (NOTE) SARS-CoV-2 target nucleic acids are DETECTED.  The SARS-CoV-2 RNA is generally detectable in upper respiratory specimens during the acute phase of infection. Positive results are indicative of the presence of the identified virus, but do not rule out bacterial infection or co-infection with other pathogens not detected by the test. Clinical correlation with patient history and other diagnostic information is necessary to determine patient infection status. The expected result is Negative.  Fact Sheet for Patients: BloggerCourse.com  Fact Sheet for Healthcare Providers: SeriousBroker.it  This test is not yet approved or cleared by the [place] FDA and  has been authorized for detection and/or diagnosis of SARS-CoV-2 by FDA under an Emergency Use Authorization (EUA).  This EUA will remain in effect (meaning th is test can be used) for the duration of  the COVID-19 declaration under Section 564(b)(1) of the Act, 21 U.S.C. section 360bbb-3(b)(1), unless the authorization is terminated or revoked sooner.     Influenza A by PCR NEGATIVE NEGATIVE Final   Influenza B by PCR NEGATIVE NEGATIVE Final    Comment: (NOTE) The Xpert Xpress SARS-CoV-2/FLU/RSV plus assay is intended as an aid in the diagnosis of influenza from Nasopharyngeal swab specimens and should not be used as a sole basis for treatment. Nasal washings and aspirates are unacceptable for Xpert Xpress SARS-CoV-2/FLU/RSV testing.  Fact Sheet for Patients: BloggerCourse.com  Fact Sheet for Healthcare Providers: SeriousBroker.it  This test is not yet approved or cleared by the [place] FDA and has been authorized for detection and/or diagnosis of SARS-CoV-2 by FDA under an Emergency Use Authorization (EUA). This EUA will remain in effect (meaning this test can be  used) for the duration of the COVID-19 declaration under Section 564(b)(1) of the Act, 21 U.S.C. section 360bbb-3(b)(1), unless the authorization is terminated or revoked.  Performed at [facility], [address], [place], [place] [address]   Culture, blood (Routine x 2)     Status: None   Collection Time: 03/19/20 12:31 AM   Specimen: BLOOD  Result Value Ref Range Status   Specimen Description   Final    BLOOD LEFT ANTECUBITAL Performed at [facility], [address], [place], [place] [address]    Special Requests   Final    BOTTLES DRAWN AEROBIC AND ANAEROBIC Blood Culture adequate volume Performed at [facility], [address] [place], [place] [address]    Culture   Final    NO GROWTH 5 DAYS Performed at [facility], [address] [place], [place] [address]    Report Status 03/24/2020 FINAL  Final  Urine culture     Status: None   Collection Time: 03/19/20  3:34 AM   Specimen: Urine, Clean Catch  Result Value Ref Range Status   Specimen Description   Final    URINE, CLEAN CATCH Performed at [facility], [address] [place], [place] [address]    Special Requests   Final    NONE Performed at [facility], [address], [place], [place] [address]    Culture   Final    NO GROWTH Performed at [facility], [address] [place], [place] [address]    Report Status 03/20/2020 FINAL  Final     Labs: CBC: Recent Labs  Lab 03/19/20 0027 03/19/20 0644 03/20/20 0613 03/21/20 0542  WBC 6.1 5.5 5.2 5.5  NEUTROABS 4.1  --  3.0 3.1  HGB 14.9 14.2 15.4 15.5  HCT 45.5 43.0 45.6 46.9  MCV 88.9 90.0 88.0 88.0  PLT 188 168 186 204   Basic Metabolic Panel: Recent Labs  Lab 03/19/20 0027 03/19/20 0644 03/20/20 0613 03/21/20 0542  NA 138  --  136 139  K 4.2  --  4.4 4.1  CL 102  --  100 100  CO2 25  --  25 26  GLUCOSE 100*  --  116* 119*  BUN  17  --  18 27*  CREATININE 1.22 1.06 1.10 1.26*  CALCIUM 9.1  --  9.2 9.3   Liver Function Tests: Recent Labs  Lab 03/19/20 0027 03/20/20 0613 03/21/20 0542  AST 36 48* 39  ALT 36 28 29  ALKPHOS 72 63 67  BILITOT 0.5 0.5 0.3  PROT 7.7 7.8 8.1  ALBUMIN 4.1 3.9 4.1   CBG: No results for input(s): GLUCAP in the last 168 hours.  Time spent: 35 minutes  Signed:  Berle Mull  [facility] 03/21/2020 9:13 AM

## 2020-04-09 IMAGING — CT CT ABDOMEN AND PELVIS WITH CONTRAST
2 of 5 series · 14 of 46 positions shown, 16 images · IV contrast (iopamidol)
Comparison: None.

CLINICAL DATA: Sharp central chest pain with nonproductive cough.
Abdominal distention.

EXAM:
CT CHEST, ABDOMEN, AND PELVIS WITH CONTRAST
TECHNIQUE: Multidetector CT imaging of the chest, abdomen and pelvis was
performed following the standard protocol during bolus
administration of intravenous contrast.
CONTRAST:  100mL [id number] IOPAMIDOL ([id number]) INJECTION 61%

[Series 2: cap with · axial · 0.83mm/px · z∈[-814,-274]mm · 11 of 128 slices shown, 13 images]
[im 10/128  soft-tissue]
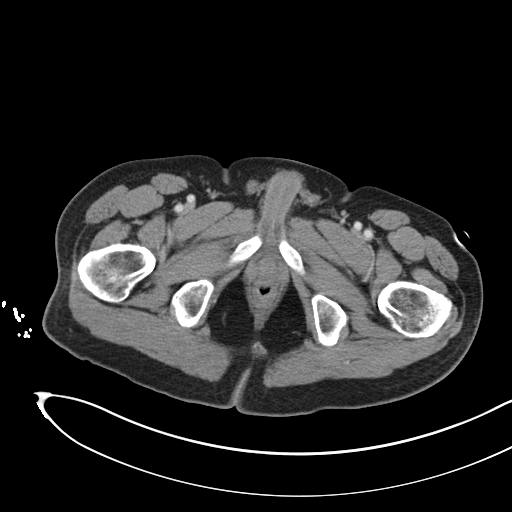
[im 10/128  bone]
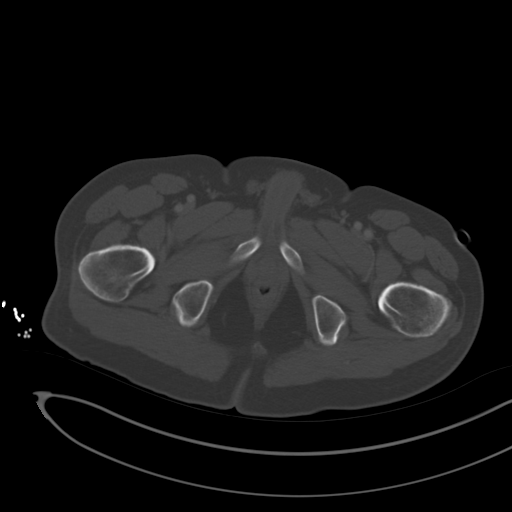
[im 19/128  soft-tissue]
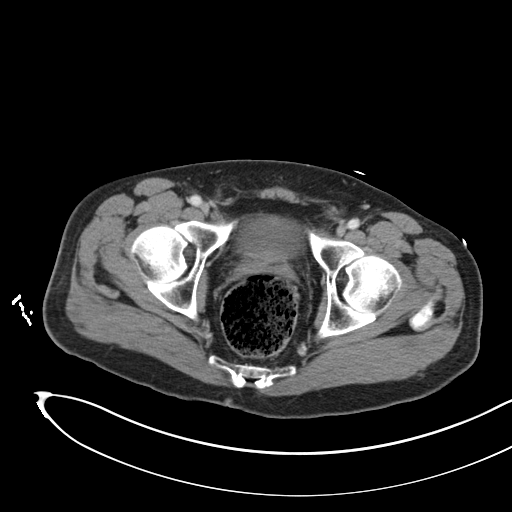
[im 28/128  soft-tissue]
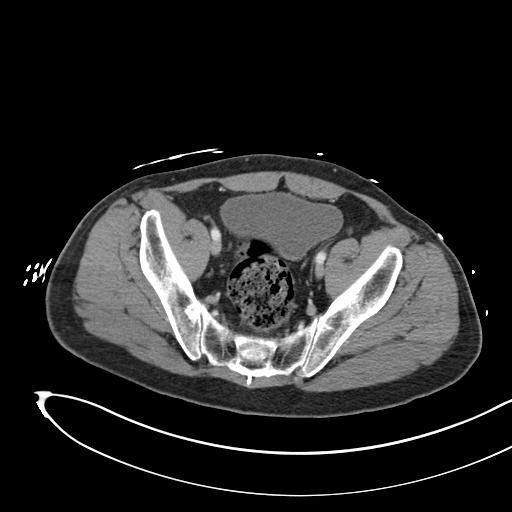
[im 46/128  soft-tissue]
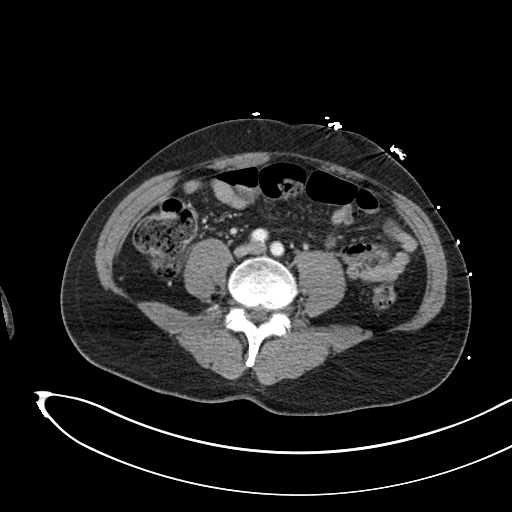
[im 55/128  soft-tissue]
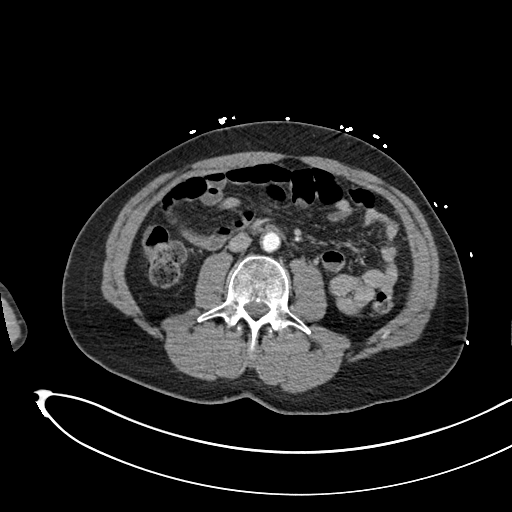
[im 64/128  soft-tissue]
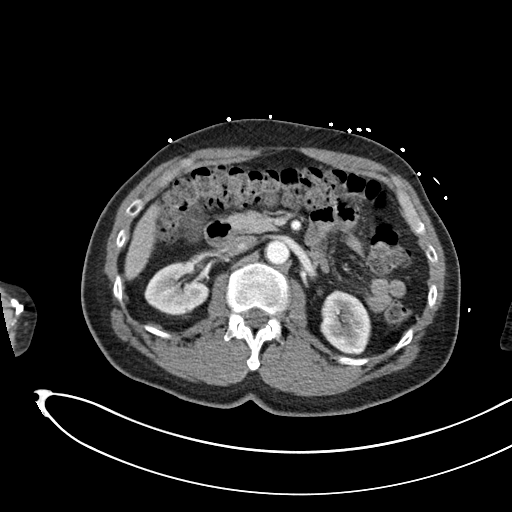
[im 73/128  soft-tissue]
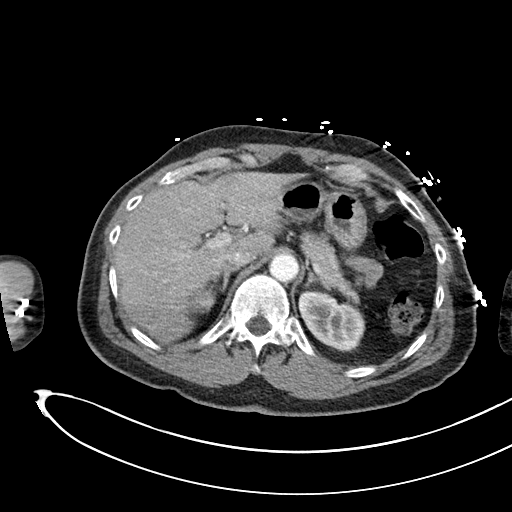
[im 82/128  soft-tissue]
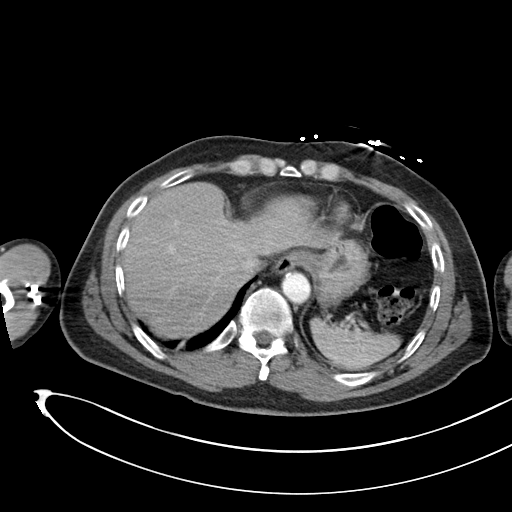
[im 100/128  soft-tissue]
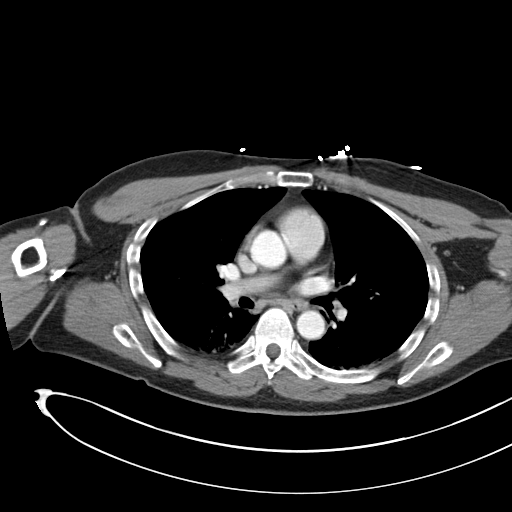
[im 100/128  bone]
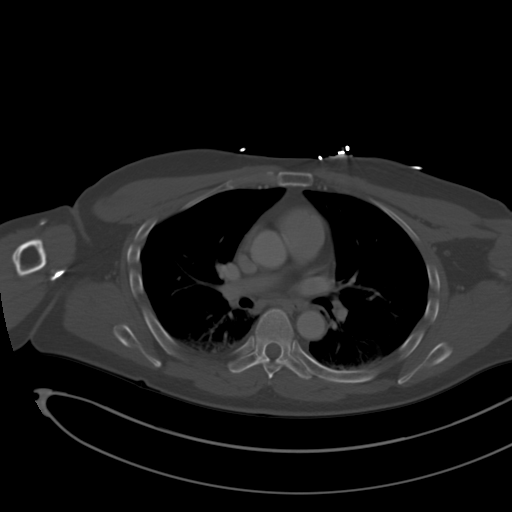
[im 109/128  soft-tissue]
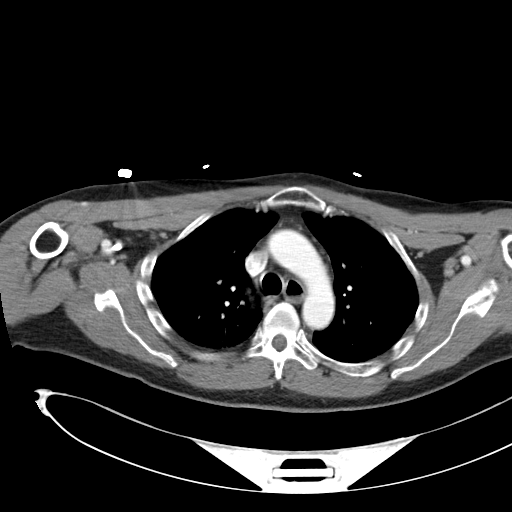
[im 118/128  soft-tissue]
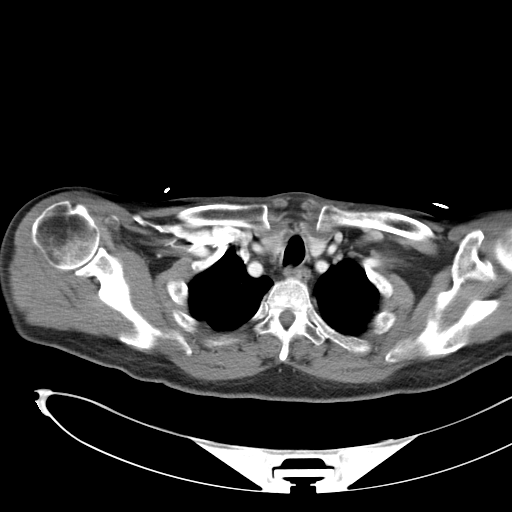

[Series 5: coronals · coronal · 0.72mm/px · 3 of 126 slices shown]
[im 42/126  soft-tissue]
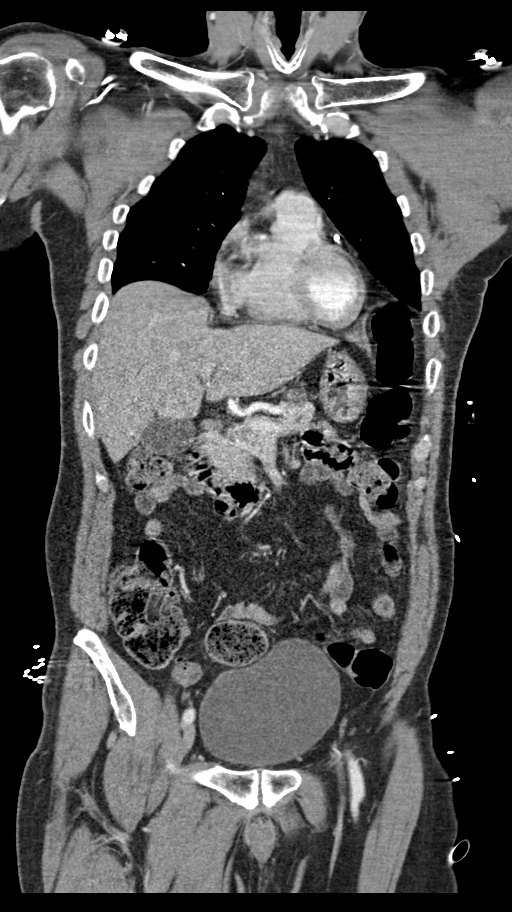
[im 56/126  soft-tissue]
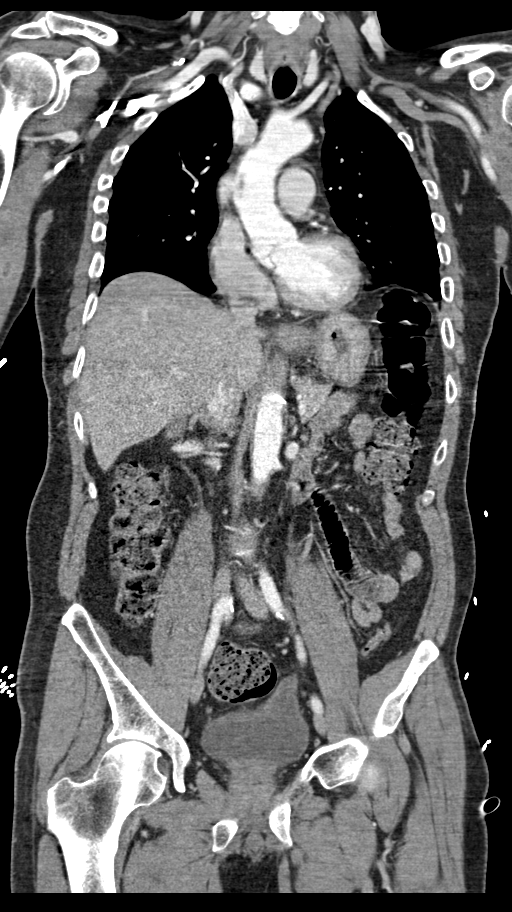
[im 70/126  soft-tissue]
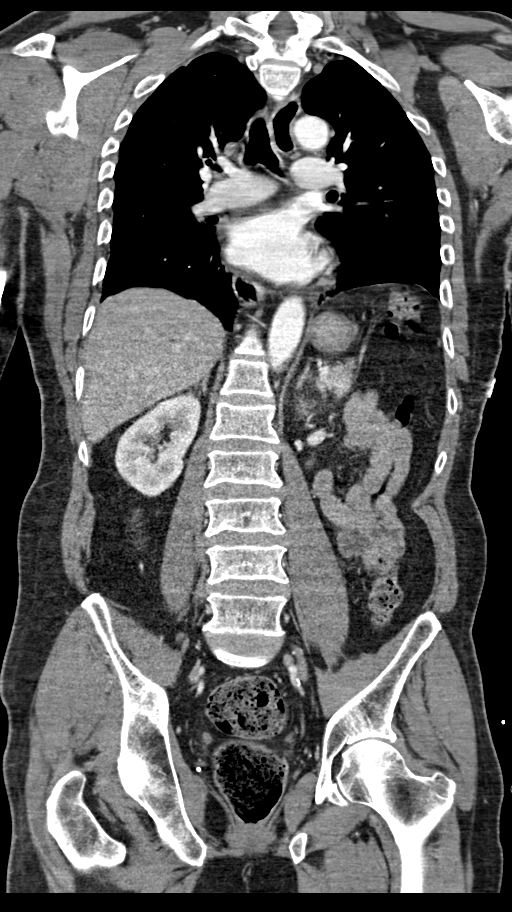

[14 of 46 positions shown; findings below may reference images not displayed]

FINDINGS: CT CHEST FINDINGS

Cardiovascular: The heart size is normal. No substantial pericardial
effusion. Coronary artery calcification is evident. No thoracic
aortic aneurysm.

Mediastinum/Nodes: No mediastinal lymphadenopathy. There is no hilar
lymphadenopathy. The esophagus has normal imaging features. There is
no axillary lymphadenopathy. 5 mm nodule noted right thyroid gland.

Lungs/Pleura: The central tracheobronchial airways are patent.
Centrilobular emphsyema noted. Compressive atelectasis noted
dependent lower lungs bilaterally with potential bandlike areas of
scarring. No pleural effusion.

Musculoskeletal: No worrisome lytic or sclerotic osseous
abnormality.

CT ABDOMEN PELVIS FINDINGS

Hepatobiliary: No suspicious focal abnormality within the liver
parenchyma. There is no evidence for gallstones, gallbladder wall
thickening, or pericholecystic fluid. No intrahepatic or
extrahepatic biliary dilation.

Pancreas: No focal mass lesion. No dilatation of the main duct. No
intraparenchymal cyst. No peripancreatic edema.

Spleen: No splenomegaly. No focal mass lesion.

Adrenals/Urinary Tract: No adrenal nodule or mass. Kidneys
unremarkable. No evidence for hydroureter. The urinary bladder
appears normal for the degree of distention.

Stomach/Bowel: Stomach is unremarkable. No gastric wall thickening.
No evidence of outlet obstruction. Duodenum is normally positioned
as is the ligament of Treitz. No small bowel wall thickening. No
small bowel dilatation. The terminal ileum is normal. The appendix
is normal. No gross colonic mass. No colonic wall thickening.

Vascular/Lymphatic: There is abdominal aortic atherosclerosis
without aneurysm. There is no gastrohepatic or hepatoduodenal
ligament lymphadenopathy. No intraperitoneal or retroperitoneal
lymphadenopathy. No pelvic sidewall lymphadenopathy.

Reproductive: The prostate gland and seminal vesicles are
unremarkable.

Other: No intraperitoneal free fluid.

Musculoskeletal: No worrisome lytic or sclerotic osseous
abnormality.
IMPRESSION: No acute findings in the chest, abdomen, or pelvis. Specifically, no
findings to explain the patient's history of chest pain with
nonproductive cough and abdominal distension.

Emphysema (B51Z1-W5Y.X).

Aortic Atherosclerois (B51Z1-170.0)

## 2020-04-28 NOTE — Addendum Note (Signed)
Encounter addended by: Nancy Manuele A, RN on: 04/28/2020 6:38 PM  Actions taken: Charge Capture section accepted

## 2020-06-03 DIAGNOSIS — F321 Major depressive disorder, single episode, moderate: Secondary | ICD-10-CM | POA: Diagnosis not present

## 2020-06-03 DIAGNOSIS — E7800 Pure hypercholesterolemia, unspecified: Secondary | ICD-10-CM | POA: Diagnosis not present

## 2020-06-03 DIAGNOSIS — F0391 Unspecified dementia with behavioral disturbance: Secondary | ICD-10-CM | POA: Diagnosis not present

## 2020-06-03 DIAGNOSIS — G20 Parkinson's disease: Secondary | ICD-10-CM | POA: Diagnosis not present

## 2020-06-03 DIAGNOSIS — I10 Essential (primary) hypertension: Secondary | ICD-10-CM | POA: Diagnosis not present

## 2020-06-03 DIAGNOSIS — I2510 Atherosclerotic heart disease of native coronary artery without angina pectoris: Secondary | ICD-10-CM | POA: Diagnosis not present

## 2020-06-07 DIAGNOSIS — G20 Parkinson's disease: Secondary | ICD-10-CM | POA: Diagnosis not present

## 2020-06-07 DIAGNOSIS — E785 Hyperlipidemia, unspecified: Secondary | ICD-10-CM | POA: Diagnosis not present

## 2020-06-07 DIAGNOSIS — G40909 Epilepsy, unspecified, not intractable, without status epilepticus: Secondary | ICD-10-CM | POA: Diagnosis not present

## 2020-06-07 DIAGNOSIS — I25118 Atherosclerotic heart disease of native coronary artery with other forms of angina pectoris: Secondary | ICD-10-CM | POA: Diagnosis not present

## 2020-06-07 DIAGNOSIS — I10 Essential (primary) hypertension: Secondary | ICD-10-CM | POA: Diagnosis not present

## 2020-07-21 DIAGNOSIS — F0391 Unspecified dementia with behavioral disturbance: Secondary | ICD-10-CM | POA: Diagnosis not present

## 2020-07-21 DIAGNOSIS — E7800 Pure hypercholesterolemia, unspecified: Secondary | ICD-10-CM | POA: Diagnosis not present

## 2020-07-21 DIAGNOSIS — R7309 Other abnormal glucose: Secondary | ICD-10-CM | POA: Diagnosis not present

## 2020-07-21 DIAGNOSIS — I10 Essential (primary) hypertension: Secondary | ICD-10-CM | POA: Diagnosis not present

## 2020-07-21 DIAGNOSIS — F321 Major depressive disorder, single episode, moderate: Secondary | ICD-10-CM | POA: Diagnosis not present

## 2020-07-21 DIAGNOSIS — Z0001 Encounter for general adult medical examination with abnormal findings: Secondary | ICD-10-CM | POA: Diagnosis not present

## 2020-07-21 DIAGNOSIS — I2510 Atherosclerotic heart disease of native coronary artery without angina pectoris: Secondary | ICD-10-CM | POA: Diagnosis not present

## 2020-07-21 DIAGNOSIS — Z79899 Other long term (current) drug therapy: Secondary | ICD-10-CM | POA: Diagnosis not present

## 2020-07-21 DIAGNOSIS — G20 Parkinson's disease: Secondary | ICD-10-CM | POA: Diagnosis not present

## 2020-07-29 ENCOUNTER — Encounter: Payer: Self-pay | Admitting: Neurology

## 2020-07-29 ENCOUNTER — Ambulatory Visit: Payer: Medicare HMO | Admitting: Neurology

## 2020-07-29 NOTE — Progress Notes (Deleted)
HISTORY OF PRESENT ILLNESS: Tyrone Noble a 70 year old male, seen in refer byhis primary care doctor [name], Dibasfor evaluation of seizure, he is accompanied by his son in law Grayland Ormondkel Williamsat today's clinical visit, he has lived with his daughter's family for5 years.  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 Marjory LiesPenumalli in 2017 for dementia, he did also have a history of alcohol abuse, in remission for many years.  He presented to the emergency room on May 26, 2017 for seizure, he was ready to have breakfast, without warning signs, he began to stare into the space, not responding, whole body shaking, foaming, out of his mouth, postevent confusion last for a few minutes, paramedic was called, he was taken to the emergency room,  MRI of the brain showed no acute abnormality, generalized atrophy, moderate supratentorium small vessel disease.  Laboratory evaluation seen March 2019 showed normal CBC, BMP showed elevated creatinine 1.37, GFR of 51, negative troponin, UA was normal  He is now back to baseline, family denies significant agitations, Mini-Mental Status Examination only 18 out of 30 today  UPDATE Feb 26 2018: He is accompanied by his daughter and son in law at visit. He is overall stable,continue to complains of short term memory loss, slow walking since 2018, sleeps too much, poor appetite,   Last seizure was on Nov 30th 2019, he was awake, not responsive,  Feb 18 2018, Sudden onset of disorientation confusion  He is taking Depakote DR 500 mg 2 tablets every night  UPDATE Feb 06 2019: Last visit was in October 2020, he had a recurrent seizure on December 19, 2018, his Keppra dose was  increased to 1000 mg twice a day, Depakote ER 200 mg daily  He continues to have seizure, had 2 recurrent seizure since last visit, the most recent one was on January 30, 2019, body shaking, not responsive  Laboratory evaluations December 24, 2018 showed Depakote level of 63, Keppra level of 38, CMP showed elevated creatinine 1.36, UDS was negative  He is taking Sinemet 25/100 mg 3 times a day, which has helped his walking,  In addition, he was noted to have worsening confusion, auditory hallucinations, scared, agitated sometimes,  He continue has mild gait abnormality, taking Sinemet 25/100 mg 3 times a day, which was helpful  UPDATE Jan 29 2020: He is accompanied by his daughter at today's clinical visit, he has been doing well over the past few months, last reported seizure was in July 2021, was found down at bathroom, in the middle of the seizing,  He is now taking Depakote ER 500 mg 2 tablets at bedtime, was on Keppra 500 mg 2 tablets twice a day, but by mistake, since November 2021, he was only getting Keppra 500 mg twice a day, daughter did not notice any difference, there was no recurrent seizure  [facility], taking Sinemet 25/100 mg 3 times a day, also Risperdal 1 mg every night,  Daughter is overall about his current progress  Update Jul 29, 2020 SS:   In November 2021 CBC, CMP were unremarkable, Depakote level was 66, Keppra level was 11.5, TSH 2.510   REVIEW OF SYSTEMS: Out of a complete 14 system  review of symptoms, the patient complains only of the following symptoms, and all other reviewed systems are negative.  Seizures, memory loss  ALLERGIES: Allergies  Allergen Reactions  . Aricept [Donepezil Hcl] Nausea Only  . Penicillins Nausea And Vomiting    Has patient had a PCN reaction causing immediate rash, facial/tongue/throat swelling, SOB or lightheadedness with hypotension: No  Has patient had a PCN reaction  causing severe rash involving mucus membranes or skin necrosis: No  Has patient had a PCN reaction that required hospitalization: No  Has patient had a PCN reaction occurring within the last 10 years: No  If all of the above answers are "NO", then may proceed with Cephalosporin use.    HOME MEDICATIONS: Outpatient Medications Prior to Visit  Medication Sig Dispense Refill  . aspirin 81 MG chewable tablet Chew 1 tablet (81 mg total) by mouth daily. 30 tablet 3  . atorvastatin (LIPITOR) 20 MG tablet Take 20 mg by mouth daily.   5  . carbidopa-levodopa (SINEMET IR) 25-100 MG tablet Take 1 tablet by mouth 3 (three) times daily. 270 tablet 4  . divalproex (DEPAKOTE ER) 500 MG 24 hr tablet Take 2 tablets (1,000 mg total) by mouth at bedtime. 180 tablet 3  . guaiFENesin (MUCINEX) 600 MG 12 hr tablet Take 1 tablet (600 mg total) by mouth 2 (two) times daily. 30 tablet 0  . levETIRAcetam (KEPPRA) 500 MG tablet Take 2 tablets (1,000 mg total) by mouth 2 (two) times daily. 360 tablet 3  . metoprolol succinate (TOPROL-XL) 25 MG 24 hr tablet Take 25 mg by mouth daily.    . Multiple Vitamin (MULTIVITAMIN WITH MINERALS) TABS tablet Take 1 tablet by mouth daily.    . risperiDONE (RISPERDAL) 1 MG tablet Take 1 tablet (1 mg total) by mouth at bedtime. 30 tablet 11   No facility-administered medications prior to visit.    PAST MEDICAL HISTORY: Past Medical History:  Diagnosis Date  . Coronary artery disease   . Dementia (HCC)   . Depression   . Heart disease   . Hypertension   . Memory loss   . Seizures (HCC)   . Stroke [facility])    "years ago" (02/16/2015)    PAST SURGICAL HISTORY: Past Surgical History:  Procedure Laterality Date  . CARDIAC CATHETERIZATION N/A 02/16/2015   Procedure: Left Heart Cath and Coronary Angiography;  Surgeon: Rinaldo Cloud, MD;  [facility] INVAS[facility] CV LAB;  Service: Cardiovascular;  Laterality: N/A;  . CARDIAC CATHETERIZATION N/A 02/16/2015   Procedure: Coronary Stent  Intervention;  Surgeon: Rinaldo Cloud, MD;  Location: [facility] INVASIVE CV LAB;  Service: Cardiovascular;  Laterality: N/A;  . CARDIAC CATHETERIZATION N/A 02/16/2015   Procedure: Intravascular Pressure Wire/FFR Study;  Surgeon: Rinaldo Cloud, MD;  Location: [facility] INVASIVE CV LAB;  Service: Cardiovascular;  Laterality: N/A;  . CORONARY ANGIOPLASTY    . LEFT HEART CATH AND CORONARY ANGIOGRAPHY N/A 03/29/2017   Procedure: LEFT HEART CATH AND CORONARY ANGIOGRAPHY;  Surgeon: Rinaldo Cloud, MD;  Location: [facility] INVASIVE CV LAB;  Service: Cardiovascular;  Laterality: N/A;  . LEFT HEART CATHETERIZATION WITH CORONARY ANGIOGRAM N/A 03/19/2012   Procedure: LEFT HEART CATHETERIZATION WITH CORONARY ANGIOGRAM;  Surgeon: Robynn Pane, MD;  Location: [facility] CATH LAB;  Service: Cardiovascular;  Laterality: N/A;  . WISDOM TOOTH EXTRACTION      FAMILY HISTORY: Family History  Problem Relation Age of Onset  . Diabetes Mother   . Hypertension Mother   . Stroke Father   . Cancer Sister  SOCIAL HISTORY: Social History   Socioeconomic History  . Marital status: Divorced    Spouse name: Not on file  . Number of children: 2  . Years of education: 60  . Highest education level: Not on file  Occupational History    Comment: retired Naval architect  Tobacco Use  . Smoking status: Former Games developer  . Smokeless tobacco: Never Used  . Tobacco comment: quit many years ago  Substance and Sexual Activity  . Alcohol use: Not Currently    Comment: 02/16/2015 "last alcohol was in ~ 2012"  . Drug use: No  . Sexual activity: Not Currently  Other Topics Concern  . Not on file  Social History Narrative   Lives with daughter    caffeine -coffee,  1 cup daily   Social Determinants of Health   Financial Resource Strain: Not on file  Food Insecurity: Not on file  Transportation Needs: Not on file  Physical Activity: Not on file  Stress: Not on file  Social Connections: Not on file  Intimate Partner Violence: Not on file    PHYSICAL EXAM  There were no vitals filed for this visit. There is no height or weight on file to calculate BMI.  Generalized: Well developed, in no acute distress  MMSE - Mini Mental State Exam 02/27/2019 09/12/2017 05/31/2017  Orientation to time 0 1 1  Orientation to Place 3 4 3   Registration 3 3 3   Attention/ Calculation 0 5 3  Recall 0 0 0  Language- name 2 objects 2 2 2   Language- repeat 1 1 1   Language- follow 3 step command 3 3 3   Language- follow 3 step command-comments - - -  Language- read & follow direction 1 1 1   Write a sentence 0 1 1  Copy design 0 0 0  Total score 13 21 18     Neurological examination  Mentation: Alert, rely on his daughter to provide history, compliant with neurological examination, Cranial nerve II-XII: Pupils were equal round reactive to light. Extraocular movements were full, visual field were full on confrontational test. Facial sensation and strength were normal. Head turning and shoulder shrug  were normal and symmetric. Motor: Left more than right arm and leg rigidity, bradykinesia, there was no significant weakness, Sensory: Intact to light touch Coordination: No dysmetria Reflexes: Hypoactive and symmetric Gait: He needs push-up to get up from seated position, leaning forward, decreased arm swing, left worse than right, small stride,  DIAGNOSTIC DATA (LABS, IMAGING, TESTING) - I reviewed patient records, labs, notes, testing and imaging myself where available.  Lab Results  Component Value Date   WBC 5.5 03/21/2020   HGB 15.5 03/21/2020   HCT 46.9 03/21/2020   MCV 88.0 03/21/2020   PLT 204 03/21/2020      Component Value Date/Time   NA 139 03/21/2020 0542   NA 144 01/29/2020 0811   K 4.1 03/21/2020 0542   CL 100 03/21/2020 0542   CO2 26 03/21/2020 0542   GLUCOSE 119 (H) 03/21/2020 0542   BUN 27 (H) 03/21/2020 0542   BUN 12 01/29/2020 0811   CREATININE 1.26 (H) 03/21/2020 0542   CALCIUM 9.3 03/21/2020 0542   PROT 8.1  03/21/2020 0542   PROT 7.4 01/29/2020 0811   ALBUMIN 4.1 03/21/2020 0542   ALBUMIN 4.5 01/29/2020 0811   AST 39 03/21/2020 0542   ALT 29 03/21/2020 0542   ALKPHOS 67 03/21/2020 0542   BILITOT 0.3 03/21/2020 0542   BILITOT 0.4 01/29/2020 05/19/2020  GFRNONAA >60 03/21/2020 0542   GFRAA 77 01/29/2020 0811   Lab Results  Component Value Date   CHOL 186 03/18/2012   HDL 72 03/18/2012   LDLCALC 105 (H) 03/18/2012   TRIG 43 03/18/2012   CHOLHDL 2.6 03/18/2012   Lab Results  Component Value Date   HGBA1C 5.6 02/16/2016   Lab Results  Component Value Date   VITAMINB12 402 02/16/2016   Lab Results  Component Value Date   TSH 2.510 01/29/2020      ASSESSMENT AND PLAN 70 y.o. year old male  Parkinson's disease Dementia with behavioral issues Epilepsy  Most recurrent seizure was in July 2021,  Overall stable with current medications, but by mistake, he was given Keppra 500 mg twice a day instead of 2 tablets twice a day, Depakote ER 500 mg 2 tablets every night  Will check level, keep Keppra 500 mg twice a day now, may adjust the dosage depending on the level  Refill Sinemet 25/100 mg 3 times daily  Risperidone 1 mg every night, he is stable now, may consider Seroquel if he has any worsening parkinsonian features, or nighttime agitations  Return to clinic in 6 months  Levert Feinstein, M.D. Ph.D.  [facility] [address] [place], [place] [address] Phone: [phone] Fax:      [phone]

## 2020-08-25 ENCOUNTER — Other Ambulatory Visit: Payer: Self-pay | Admitting: Neurology

## 2020-08-30 ENCOUNTER — Telehealth: Payer: Self-pay | Admitting: Neurology

## 2020-08-30 NOTE — Telephone Encounter (Signed)
Pt is needing a refill on his divalproex (DEPAKOTE ER) 500 MG 24 hr tablet sent in to the [facility]

## 2020-08-30 NOTE — Telephone Encounter (Signed)
Called pharmacy, spoke with Angelica and informed her MD refilled medication on 01/29/20 x 1 year. She stated error is on their part; she will refill it.

## 2020-09-28 ENCOUNTER — Telehealth: Payer: Self-pay

## 2020-09-28 MED ORDER — LEVETIRACETAM 500 MG PO TABS: 1000.0000 mg | ORAL_TABLET | Freq: Two times a day (BID) | ORAL | 3 refills | Status: DC

## 2020-09-28 NOTE — Telephone Encounter (Signed)
Refill sent to pharmacy.   

## 2020-10-11 ENCOUNTER — Ambulatory Visit: Payer: Self-pay | Admitting: Neurology

## 2020-10-11 NOTE — Progress Notes (Deleted)
HISTORY OF PRESENT ILLNESS: Tyrone Noble is a 70 year old male, seen in refer by his primary care doctor [name], [name] for evaluation of seizure, he is accompanied by his son in law Alcario Droughtkel Williams at today's clinical visit, he has lived with his daughter's family for 5 years.   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 Marjory LiesPenumalli in 2017 for dementia, he did also have a history of alcohol abuse, in remission for many years.   He presented to the emergency room on May 26, 2017 for seizure, he was ready to have breakfast, without warning signs, he began to stare into the space, not responding, whole body shaking, foaming, out of his mouth, postevent confusion last for a few minutes, paramedic was called, he was taken to the emergency room,   MRI of the brain showed no acute abnormality, generalized atrophy, moderate supratentorium small vessel disease.   Laboratory evaluation seen March 2019 showed normal CBC, BMP showed elevated creatinine 1.37, GFR of 51, negative troponin, UA was normal   He is now back to baseline, family denies significant agitations, Mini-Mental Status Examination only 18 out of 30 today   UPDATE Feb 26 2018: He is accompanied by his daughter and son in law at visit. He is overall stable,continue to complains of short term memory loss, slow walking since 2018, sleeps too much, poor appetite,    Last seizure was on Nov 30th 2019, he was awake, not responsive,  Feb 18 2018,  Sudden onset of disorientation confusion   He is taking Depakote DR 500 mg 2 tablets every night   UPDATE Feb 06 2019: Last visit was in October 2020, he had a recurrent seizure on December 19, 2018, his Keppra dose was  increased to 1000 mg twice a day, Depakote ER 200 mg daily   He continues to have seizure, had 2 recurrent seizure since last visit, the most recent one was on January 30, 2019, body shaking, not responsive   Laboratory evaluations December 24, 2018 showed Depakote level of 63, Keppra level of 38, CMP showed elevated creatinine 1.36, UDS was negative   He is taking Sinemet 25/100 mg 3 times a day, which has helped his walking,   In addition, he was noted to have worsening confusion, auditory hallucinations, scared, agitated sometimes,   He continue has mild gait abnormality, taking Sinemet 25/100 mg 3 times a day, which was helpful  UPDATE Jan 29 2020: He is accompanied by his daughter at today's clinical visit, he has been doing well over the past few months, last reported seizure was in July 2021, was found down at bathroom, in the middle of the seizing,  He is now taking Depakote ER 500 mg 2 tablets at bedtime, was on Keppra 500 mg 2 tablets twice a day, but by mistake, since November 2021, he was only getting Keppra 500 mg twice a day, daughter did not notice any difference, there was no recurrent seizure  He [facility] to [facility], taking Sinemet 25/100 mg 3 times a day, also Risperdal 1 mg every night,  Daughter is overall about his current progress  Update October 11, 2020 SS:    REVIEW OF SYSTEMS: Out of a complete  14 system review of symptoms, the patient complains only of the following symptoms, and all other reviewed systems are negative.  Seizures, memory loss  ALLERGIES: Allergies  Allergen Reactions   Aricept [Donepezil Hcl] Nausea Only   Penicillins Nausea And Vomiting    Has patient had a PCN reaction causing immediate rash, facial/tongue/throat swelling, SOB or lightheadedness with hypotension: No  Has patient had a PCN reaction causing severe rash involving mucus membranes or skin necrosis: No  Has patient had a PCN reaction that  required hospitalization: No  Has patient had a PCN reaction occurring within the last 10 years: No  If all of the above answers are "NO", then may proceed with Cephalosporin use.    HOME MEDICATIONS: Outpatient Medications Prior to Visit  Medication Sig Dispense Refill   aspirin 81 MG chewable tablet Chew 1 tablet (81 mg total) by mouth daily. 30 tablet 3   atorvastatin (LIPITOR) 20 MG tablet Take 20 mg by mouth daily.   5   carbidopa-levodopa (SINEMET IR) 25-100 MG tablet TAKE ONE TABLET BY MOUTH THREE TIMES DAILY 270 tablet 0   divalproex (DEPAKOTE ER) 500 MG 24 hr tablet Take 2 tablets (1,000 mg total) by mouth at bedtime. 180 tablet 3   guaiFENesin (MUCINEX) 600 MG 12 hr tablet Take 1 tablet (600 mg total) by mouth 2 (two) times daily. 30 tablet 0   levETIRAcetam (KEPPRA) 500 MG tablet Take 2 tablets (1,000 mg total) by mouth 2 (two) times daily. 360 tablet 3   metoprolol succinate (TOPROL-XL) 25 MG 24 hr tablet Take 25 mg by mouth daily.     Multiple Vitamin (MULTIVITAMIN WITH MINERALS) TABS tablet Take 1 tablet by mouth daily.     risperiDONE (RISPERDAL) 1 MG tablet Take 1 tablet (1 mg total) by mouth at bedtime. 30 tablet 11   No facility-administered medications prior to visit.    PAST MEDICAL HISTORY: Past Medical History:  Diagnosis Date   Coronary artery disease    Dementia (HCC)    Depression    Heart disease    Hypertension    Memory loss    Seizures (HCC)    Stroke (HCC)    "years ago" (02/16/2015)    PAST SURGICAL HISTORY: Past Surgical History:  Procedure Laterality Date   CARDIAC CATHETERIZATION N/A 02/16/2015   Procedure: Left Heart Cath and Coronary Angiography;  Surgeon: Rinaldo Cloud, MD;  Location: [facility] INVASIVE CV LAB;  Service: Cardiovascular;  Laterality: N/A;   CARDIAC CATHETERIZATION N/A 02/16/2015   Procedure: Coronary Stent Intervention;  Surgeon: Rinaldo Cloud, MD;  Location: [facility] INVASIVE CV LAB;  Service: Cardiovascular;  Laterality: N/A;    CARDIAC CATHETERIZATION N/A 02/16/2015   Procedure: Intravascular Pressure Wire/FFR Study;  Surgeon: Rinaldo Cloud, MD;  Location: [facility] INVASIVE CV LAB;  Service: Cardiovascular;  Laterality: N/A;   CORONARY ANGIOPLASTY     LEFT HEART CATH AND CORONARY ANGIOGRAPHY N/A 03/29/2017   Procedure: LEFT HEART CATH AND CORONARY ANGIOGRAPHY;  Surgeon: Rinaldo Cloud, MD;  Location: [facility] INVASIVE CV LAB;  Service: Cardiovascular;  Laterality: N/A;   LEFT HEART CATHETERIZATION WITH CORONARY ANGIOGRAM N/A 03/19/2012   Procedure: LEFT HEART CATHETERIZATION WITH CORONARY ANGIOGRAM;  Surgeon: Robynn Pane, MD;  Location: [facility] CATH LAB;  Service: Cardiovascular;  Laterality: N/A;   WISDOM TOOTH EXTRACTION      FAMILY HISTORY: Family History  Problem Relation Age of Onset   Diabetes Mother    Hypertension Mother    Stroke Father    Cancer Sister  SOCIAL HISTORY: Social History   Socioeconomic History   Marital status: Divorced    Spouse name: Not on file   Number of children: 2   Years of education: 12   Highest education level: Not on file  Occupational History    Comment: retired Naval architect  Tobacco Use   Smoking status: Former   Smokeless tobacco: Never   Tobacco comments:    quit many years ago  Substance and Sexual Activity   Alcohol use: Not Currently    Comment: 02/16/2015 "last alcohol was in ~ 2012"   Drug use: No   Sexual activity: Not Currently  Other Topics Concern   Not on file  Social History Narrative   Lives with daughter    caffeine -coffee,  1 cup daily   Social Determinants of Health   Financial Resource Strain: Not on file  Food Insecurity: Not on file  Transportation Needs: Not on file  Physical Activity: Not on file  Stress: Not on file  Social Connections: Not on file  Intimate Partner Violence: Not on file   PHYSICAL EXAM  There were no vitals filed for this visit.  There is no height or weight on file to calculate BMI.  Generalized: Well  developed, in no acute distress  MMSE - Mini Mental State Exam 02/27/2019 09/12/2017 05/31/2017  Orientation to time 0 1 1  Orientation to Place 3 4 3   Registration 3 3 3   Attention/ Calculation 0 5 3  Recall 0 0 0  Language- name 2 objects 2 2 2   Language- repeat 1 1 1   Language- follow 3 step command 3 3 3   Language- follow 3 step command-comments - - -  Language- read & follow direction 1 1 1   Write a sentence 0 1 1  Copy design 0 0 0  Total score 13 21 18     Neurological examination  Mentation: Alert, rely on his daughter to provide history, compliant with neurological examination, Cranial nerve II-XII: Pupils were equal round reactive to light. Extraocular movements were full, visual field were full on confrontational test. Facial sensation and strength were normal. Head turning and shoulder shrug  were normal and symmetric. Motor: Left more than right arm and leg rigidity, bradykinesia, there was no significant weakness, Sensory: Intact to light touch Coordination: No dysmetria Reflexes: Hypoactive and symmetric Gait: He needs push-up to get up from seated position, leaning forward, decreased arm swing, left worse than right, small stride,  DIAGNOSTIC DATA (LABS, IMAGING, TESTING) - I reviewed patient records, labs, notes, testing and imaging myself where available.  Lab Results  Component Value Date   WBC 5.5 03/21/2020   HGB 15.5 03/21/2020   HCT 46.9 03/21/2020   MCV 88.0 03/21/2020   PLT 204 03/21/2020      Component Value Date/Time   NA 139 03/21/2020 0542   NA 144 01/29/2020 0811   K 4.1 03/21/2020 0542   CL 100 03/21/2020 0542   CO2 26 03/21/2020 0542   GLUCOSE 119 (H) 03/21/2020 0542   BUN 27 (H) 03/21/2020 0542   BUN 12 01/29/2020 0811   CREATININE 1.26 (H) 03/21/2020 0542   CALCIUM 9.3 03/21/2020 0542   PROT 8.1 03/21/2020 0542   PROT 7.4 01/29/2020 0811   ALBUMIN 4.1 03/21/2020 0542   ALBUMIN 4.5 01/29/2020 0811   AST 39 03/21/2020 0542   ALT 29  03/21/2020 0542   ALKPHOS 67 03/21/2020 0542   BILITOT 0.3 03/21/2020 0542   BILITOT 0.4 01/29/2020 05/19/2020  GFRNONAA >60 03/21/2020 0542   GFRAA 77 01/29/2020 0811   Lab Results  Component Value Date   CHOL 186 03/18/2012   HDL 72 03/18/2012   LDLCALC 105 (H) 03/18/2012   TRIG 43 03/18/2012   CHOLHDL 2.6 03/18/2012   Lab Results  Component Value Date   HGBA1C 5.6 02/16/2016   Lab Results  Component Value Date   VITAMINB12 402 02/16/2016   Lab Results  Component Value Date   TSH 2.510 01/29/2020      ASSESSMENT AND PLAN 70 y.o. year old male  Parkinson's disease Dementia with behavioral issues Epilepsy  Most recurrent seizure was in July 2021,  Overall stable with current medications, but by mistake, he was given Keppra 500 mg twice a day instead of 2 tablets twice a day, Depakote ER 500 mg 2 tablets every night  Will check level, keep Keppra 500 mg twice a day now, may adjust the dosage depending on the level  Refill Sinemet 25/100 mg 3 times daily  Risperidone 1 mg every night, he is stable now, may consider Seroquel if he has any worsening parkinsonian features, or nighttime agitations  Return to clinic in 6 months

## 2020-10-26 DIAGNOSIS — F17290 Nicotine dependence, other tobacco product, uncomplicated: Secondary | ICD-10-CM | POA: Diagnosis not present

## 2020-10-26 DIAGNOSIS — I25118 Atherosclerotic heart disease of native coronary artery with other forms of angina pectoris: Secondary | ICD-10-CM | POA: Diagnosis not present

## 2020-10-26 DIAGNOSIS — E785 Hyperlipidemia, unspecified: Secondary | ICD-10-CM | POA: Diagnosis not present

## 2020-10-26 DIAGNOSIS — I10 Essential (primary) hypertension: Secondary | ICD-10-CM | POA: Diagnosis not present

## 2020-11-24 DIAGNOSIS — I10 Essential (primary) hypertension: Secondary | ICD-10-CM | POA: Diagnosis not present

## 2020-11-24 DIAGNOSIS — E785 Hyperlipidemia, unspecified: Secondary | ICD-10-CM | POA: Diagnosis not present

## 2020-11-25 ENCOUNTER — Other Ambulatory Visit: Payer: Self-pay | Admitting: Neurology

## 2020-12-09 ENCOUNTER — Telehealth: Payer: Medicare HMO | Admitting: Neurology

## 2020-12-09 NOTE — Telephone Encounter (Signed)
Pt's daughter called to r/s her father's appt. Pt has No Showed twice and cx twice. Next available appt is not until next year and daughter does not want to wait that long and would like to speak to a Charity fundraiser. Please advise.

## 2020-12-09 NOTE — Telephone Encounter (Signed)
I spoke to the patient's daughter. She apologized for the patient missing appts. Says she was overwhelmed with work and she should have communicated better with our office. Her husband is now retired and can bring the patient with no problems. He has been rescheduled to 01/18/21. They will check in at 8:30am for a 9am appt with Dr. Terrace Arabia.

## 2020-12-23 ENCOUNTER — Emergency Department (HOSPITAL_COMMUNITY): Payer: Medicare HMO

## 2020-12-23 ENCOUNTER — Other Ambulatory Visit: Payer: Self-pay

## 2020-12-23 ENCOUNTER — Emergency Department (HOSPITAL_COMMUNITY)
Admission: EM | Admit: 2020-12-23 | Discharge: 2020-12-23 | Disposition: A | Discharge status: Home / Self Care | Payer: Medicare HMO | Attending: Emergency Medicine | Admitting: Emergency Medicine

## 2020-12-23 ENCOUNTER — Encounter (HOSPITAL_COMMUNITY): Payer: Self-pay

## 2020-12-23 DIAGNOSIS — R1084 Generalized abdominal pain: Secondary | ICD-10-CM | POA: Diagnosis not present

## 2020-12-23 DIAGNOSIS — R4182 Altered mental status, unspecified: Secondary | ICD-10-CM | POA: Diagnosis not present

## 2020-12-23 DIAGNOSIS — Z79899 Other long term (current) drug therapy: Secondary | ICD-10-CM | POA: Insufficient documentation

## 2020-12-23 DIAGNOSIS — G20 Parkinson's disease: Secondary | ICD-10-CM | POA: Diagnosis not present

## 2020-12-23 DIAGNOSIS — G319 Degenerative disease of nervous system, unspecified: Secondary | ICD-10-CM | POA: Diagnosis not present

## 2020-12-23 DIAGNOSIS — H1132 Conjunctival hemorrhage, left eye: Secondary | ICD-10-CM | POA: Diagnosis not present

## 2020-12-23 DIAGNOSIS — Z20822 Contact with and (suspected) exposure to covid-19: Secondary | ICD-10-CM | POA: Diagnosis not present

## 2020-12-23 DIAGNOSIS — Z7982 Long term (current) use of aspirin: Secondary | ICD-10-CM | POA: Diagnosis not present

## 2020-12-23 DIAGNOSIS — F0390 Unspecified dementia without behavioral disturbance: Secondary | ICD-10-CM | POA: Insufficient documentation

## 2020-12-23 DIAGNOSIS — R531 Weakness: Secondary | ICD-10-CM | POA: Diagnosis not present

## 2020-12-23 DIAGNOSIS — R9431 Abnormal electrocardiogram [ECG] [EKG]: Secondary | ICD-10-CM | POA: Diagnosis not present

## 2020-12-23 DIAGNOSIS — Z87891 Personal history of nicotine dependence: Secondary | ICD-10-CM | POA: Insufficient documentation

## 2020-12-23 DIAGNOSIS — Z8616 Personal history of COVID-19: Secondary | ICD-10-CM | POA: Diagnosis not present

## 2020-12-23 DIAGNOSIS — I10 Essential (primary) hypertension: Secondary | ICD-10-CM | POA: Diagnosis not present

## 2020-12-23 DIAGNOSIS — R42 Dizziness and giddiness: Secondary | ICD-10-CM | POA: Diagnosis not present

## 2020-12-23 DIAGNOSIS — R2681 Unsteadiness on feet: Secondary | ICD-10-CM | POA: Diagnosis not present

## 2020-12-23 DIAGNOSIS — I2510 Atherosclerotic heart disease of native coronary artery without angina pectoris: Secondary | ICD-10-CM | POA: Insufficient documentation

## 2020-12-23 LAB — HEPATIC FUNCTION PANEL
ALT: 19 U/L (ref 0–44)
AST: 24 U/L (ref 15–41)
Albumin: 3.6 g/dL (ref 3.5–5.0)
Alkaline Phosphatase: 66 U/L (ref 38–126)
Bilirubin, Direct: <0.1 mg/dL (ref 0.0–0.2)
Total Bilirubin: 0.9 mg/dL (ref 0.3–1.2)
Total Protein: 6.6 g/dL (ref 6.5–8.1)

## 2020-12-23 LAB — URINALYSIS, ROUTINE W REFLEX MICROSCOPIC
Bilirubin Urine: NEGATIVE
Glucose, UA: NEGATIVE mg/dL
Hgb urine dipstick: NEGATIVE
Ketones, ur: 5 mg/dL — AB
Leukocytes,Ua: NEGATIVE
Nitrite: NEGATIVE
Protein, ur: NEGATIVE mg/dL
Specific Gravity, Urine: 1.015 (ref 1.005–1.030)
pH: 6 (ref 5.0–8.0)

## 2020-12-23 LAB — CBC
HCT: 44.2 % (ref 39.0–52.0)
Hemoglobin: 14.3 g/dL (ref 13.0–17.0)
MCH: 29.2 pg (ref 26.0–34.0)
MCHC: 32.4 g/dL (ref 30.0–36.0)
MCV: 90.2 fL (ref 80.0–100.0)
Platelets: 212 10*3/uL (ref 150–400)
RBC: 4.9 MIL/uL (ref 4.22–5.81)
RDW: 14.1 % (ref 11.5–15.5)
WBC: 6.5 10*3/uL (ref 4.0–10.5)
nRBC: 0 % (ref 0.0–0.2)

## 2020-12-23 LAB — BASIC METABOLIC PANEL
Anion gap: 8 (ref 5–15)
BUN: 15 mg/dL (ref 8–23)
CO2: 27 mmol/L (ref 22–32)
Calcium: 9.3 mg/dL (ref 8.9–10.3)
Chloride: 104 mmol/L (ref 98–111)
Creatinine, Ser: 1.15 mg/dL (ref 0.61–1.24)
GFR, Estimated: >60 mL/min (ref >60)
Glucose, Bld: 85 mg/dL (ref 70–99)
Potassium: 4.2 mmol/L (ref 3.5–5.1)
Sodium: 139 mmol/L (ref 135–145)

## 2020-12-23 LAB — RESP PANEL BY RT-PCR (FLU A&B, COVID) ARPGX2
Influenza A by PCR: NEGATIVE
Influenza B by PCR: NEGATIVE
SARS Coronavirus 2 by RT PCR: NEGATIVE

## 2020-12-23 LAB — VALPROIC ACID LEVEL: Valproic Acid Lvl: 44 ug/mL — ABNORMAL LOW (ref 50.0–100.0)

## 2020-12-23 LAB — AMMONIA: Ammonia: <10 umol/L (ref 9–35)

## 2020-12-23 LAB — CBG MONITORING, ED: Glucose-Capillary: 83 mg/dL (ref 70–99)

## 2020-12-23 MED ORDER — TETRACAINE HCL 0.5 % OP SOLN
2.0000 [drp] | Freq: Once | OPHTHALMIC | Status: AC
  Administered 2020-12-23: 2 [drp] via OPHTHALMIC
  Filled 2020-12-23: qty 4

## 2020-12-23 NOTE — ED Provider Notes (Signed)
[facility] EMERGENCY DEPARTMENT Provider Note   CSN: [id number] Arrival date & time: 12/23/20  9798     History Chief Complaint  Patient presents with   Weakness    Tyrone Noble is a 70 y.o. male with a history of dementia, seizures, HTN presenting with his daughter for generalized weakness for generalized weakness.  Daughter reports that this morning patient woke up and she s noticed that there was a little spot of blood on his left eye which had not been present before.  He was very unsteady on his feet with some dizziness and could not walk this morning because of it.  He also notes that he has had trouble seeing out of his left eye as it has been blurry.  At home, they checked his blood pressure and his measurements showed a systolic of 170.  He is took his home medications and was brought to the ED.  Daughter does note he has not had any sick contacts.  But does feel like he has been a little bit off since yesterday, he has a little bit of decreased appetite and they kept him home from the memory care center yesterday.  He has not had any seizure-like activity in the last 6 months.   Weakness Associated symptoms: dizziness   Associated symptoms: no abdominal pain, no chest pain, no cough, no fever, no headaches and no shortness of breath       Past Medical History:  Diagnosis Date   Coronary artery disease    Dementia (HCC)    Depression    Heart disease    Hypertension    Memory loss    Seizures (HCC)    Stroke (HCC)    "years ago" (02/16/2015)    Patient Active Problem List   Diagnosis Date Noted   COVID-19 virus infection 03/19/2020   Generalized weakness 03/19/2020   Essential hypertension 03/19/2020   Seizure disorder (HCC) 03/19/2020   Parkinsonism (HCC) 02/26/2018   Therapeutic drug monitoring 09/12/2017   Seizures (HCC) 05/31/2017   Dementia without behavioral disturbance (HCC) 05/31/2017   New-onset angina (HCC) 02/16/2015    HYPERLIPIDEMIA 12/13/2009   ALCOHOLISM 07/07/2009   CALLUS, TOE 01/03/2007   DEPRESSION, MAJOR, RECURRENT 05/17/2006   TOBACCO DEPENDENCE 05/17/2006   HYPERTENSION, BENIGN SYSTEMIC 05/17/2006   HEMORRHOIDS, NOS 05/17/2006   GASTROESOPHAGEAL REFLUX, NO ESOPHAGITIS 05/17/2006   LEG PAIN OR KNEE PAIN 05/17/2006    Past Surgical History:  Procedure Laterality Date   CARDIAC CATHETERIZATION N/A 02/16/2015   Procedure: Left Heart Cath and Coronary Angiography;  Surgeon: Rinaldo Cloud, MD;  Location: [facility] INVASIVE CV LAB;  Service: Cardiovascular;  Laterality: N/A;   CARDIAC CATHETERIZATION N/A 02/16/2015   Procedure: Coronary Stent Intervention;  Surgeon: Rinaldo Cloud, MD;  Location: [facility] INVASIVE CV LAB;  Service: Cardiovascular;  Laterality: N/A;   CARDIAC CATHETERIZATION N/A 02/16/2015   Procedure: Intravascular Pressure Wire/FFR Study;  Surgeon: Rinaldo Cloud, MD;  Location: [facility] INVASIVE CV LAB;  Service: Cardiovascular;  Laterality: N/A;   CORONARY ANGIOPLASTY     LEFT HEART CATH AND CORONARY ANGIOGRAPHY N/A 03/29/2017   Procedure: LEFT HEART CATH AND CORONARY ANGIOGRAPHY;  Surgeon: Rinaldo Cloud, MD;  Location: [facility] INVASIVE CV LAB;  Service: Cardiovascular;  Laterality: N/A;   LEFT HEART CATHETERIZATION WITH CORONARY ANGIOGRAM N/A 03/19/2012   Procedure: LEFT HEART CATHETERIZATION WITH CORONARY ANGIOGRAM;  Surgeon: Robynn Pane, MD;  Location: [facility] CATH LAB;  Service: Cardiovascular;  Laterality: N/A;   WISDOM TOOTH EXTRACTION  Family History  Problem Relation Age of Onset   Diabetes Mother    Hypertension Mother    Stroke Father    Cancer Sister     Social History   Tobacco Use   Smoking status: Former   Smokeless tobacco: Never   Tobacco comments:    quit many years ago  Substance Use Topics   Alcohol use: Not Currently    Comment: 02/16/2015 "last alcohol was in ~ 2012"   Drug use: No    Home Medications Prior to Admission medications   Medication Sig Start  Date End Date Taking? Authorizing Provider  aspirin 81 MG chewable tablet Chew 1 tablet (81 mg total) by mouth daily. 02/17/15   Rinaldo Cloud, MD  atorvastatin (LIPITOR) 20 MG tablet Take 20 mg by mouth daily.  05/08/17   [provider]  carbidopa-levodopa (SINEMET IR) 25-100 MG tablet Take 1 tablet by mouth 3 (three) times daily. Please call [phone] to schedule follow up to continue refills or may request from PCP. 11/25/20   Levert Feinstein, MD  divalproex (DEPAKOTE ER) 500 MG 24 hr tablet Take 2 tablets (1,000 mg total) by mouth at bedtime. 01/29/20   Levert Feinstein, MD  guaiFENesin (MUCINEX) 600 MG 12 hr tablet Take 1 tablet (600 mg total) by mouth 2 (two) times daily. 03/21/20   Rolly Salter, MD  levETIRAcetam (KEPPRA) 500 MG tablet Take 2 tablets (1,000 mg total) by mouth 2 (two) times daily. 09/28/20   Levert Feinstein, MD  metoprolol succinate (TOPROL-XL) 25 MG 24 hr tablet Take 25 mg by mouth daily. 11/19/15   [provider]  Multiple Vitamin (MULTIVITAMIN WITH MINERALS) TABS tablet Take 1 tablet by mouth daily.    [provider]  risperiDONE (RISPERDAL) 1 MG tablet Take 1 tablet (1 mg total) by mouth at bedtime. 01/29/20   Levert Feinstein, MD    Allergies    Aricept Palma Holter hcl] and Penicillins  Review of Systems   Review of Systems  Constitutional:  Positive for appetite change (decreased appetite since yesterday). Negative for chills, diaphoresis, fatigue and fever.  HENT:  Negative for congestion, facial swelling, rhinorrhea and trouble swallowing.   Eyes:  Positive for redness and visual disturbance (blurred vision). Negative for pain.  Respiratory:  Negative for cough, chest tightness and shortness of breath.   Cardiovascular:  Negative for chest pain, palpitations and leg swelling.  Gastrointestinal:  Negative for abdominal distention and abdominal pain.  Genitourinary:  Negative for decreased urine volume and difficulty urinating.  Skin:  Negative for rash.   Neurological:  Positive for dizziness, tremors (baseline parkinson's tremor) and weakness. Negative for syncope, light-headedness and headaches.   Physical Exam Updated Vital Signs BP 130/89 (BP Location: Right Arm)   Pulse 73   Temp 98.6 F (37 C) (Oral)   Resp 15   Ht 5\' 6"  (1.676 m)   Wt 65.3 kg   SpO2 100%   BMI 23.24 kg/m   Physical Exam Constitutional:      Appearance: He is normal weight.  HENT:     Head: Normocephalic and atraumatic.     Nose: Nose normal.     Mouth/Throat:     Mouth: Mucous membranes are moist.     Pharynx: Oropharynx is clear.  Eyes:     Extraocular Movements: Extraocular movements intact.     Conjunctiva/sclera:     Left eye: Hemorrhage present.     Pupils: Pupils are equal, round, and reactive to light.  Comments: Subconjunctival hemorrhage of left lateral eye  Cardiovascular:     Rate and Rhythm: Normal rate and regular rhythm.     Pulses: Normal pulses.     Heart sounds: Normal heart sounds.  Pulmonary:     Effort: Pulmonary effort is normal.     Breath sounds: Normal breath sounds.  Abdominal:     General: Abdomen is flat.     Tenderness: There is abdominal tenderness (mild diffuse tenderness). There is no guarding.     Comments: Abdomen feels mildly firm/full though not truly distended  Musculoskeletal:        General: No swelling or tenderness. Normal range of motion.     Cervical back: Normal range of motion and neck supple.     Right lower leg: No edema.     Left lower leg: No edema.  Skin:    General: Skin is warm and dry.     Capillary Refill: Capillary refill takes less than 2 seconds.     Findings: No bruising.  Neurological:     General: No focal deficit present.     Mental Status: He is alert. Mental status is at baseline.     Sensory: No sensory deficit.     Motor: Weakness (4/5 strength of bilateral upper and lower extremities) present.  Psychiatric:        Mood and Affect: Mood normal.        Behavior: Behavior  normal.    ED Results / Procedures / Treatments   Labs (all labs ordered are listed, but only abnormal results are displayed) Labs Reviewed  URINALYSIS, ROUTINE W REFLEX MICROSCOPIC - Abnormal; Notable for the following components:      Result Value   Ketones, ur 5 (*)    All other components within normal limits  RESP PANEL BY RT-PCR (FLU A&B, COVID) ARPGX2  BASIC METABOLIC PANEL  CBC  VALPROIC ACID LEVEL  AMMONIA  HEPATIC FUNCTION PANEL  CBG MONITORING, ED    EKG None  Radiology No results found.  Procedures Procedures   Medications Ordered in ED Medications  tetracaine (PONTOCAINE) 0.5 % ophthalmic solution 2 drop (has no administration in time range)    ED Course  I have reviewed the triage vital signs and the nursing notes.  Pertinent labs & imaging results that were available during my care of the patient were reviewed by me and considered in my medical decision making (see chart for details).    MDM Rules/Calculators/A&P  Tyrone Noble is a 70 y.o. male with a history of dementia, HTN, seizure disorder presenting with generalized weakness.  Patient has had blurry vision in the left eye since awakening this morning with a subconjunctival hemorrhage present on the lateral side of the left eye.  Generalized weakness with an unsteady gait at home, was not able to safely walk the patient while in the ED but strength for all extremities was 4/5. MRI brain without contrast was ordered due to acute onset of symptoms and is still waiting to be completed, as well as his chest x-ray.  IOPs were checked by Dr. Dalene Seltzer and were measured at 22, which is reassuring. Still awaiting for MRI to be completed.   3:23 PM Discussed plan of care and further work-up with Dr. Renaye Rakers for following up the MRI and re-evaluating blood pressure and ocular symptoms. If patient remains stable or improves and there are no significant findings on imaging then patient may be okay to  discharge with close PCP follow-up.  Final Clinical Impression(s) / ED Diagnoses Final diagnoses:  Weakness  Subconjunctival hemorrhage of left eye     Jeanclaude Wentworth, DO 12/23/20 1526    Alvira Monday, MD 12/23/20 2143

## 2020-12-23 NOTE — ED Provider Notes (Signed)
70 yo male w/ dementia presenting from home with difficulty walking since yesterday, pending MRI of the brain.  MRI reviewed - no acute findings, some mild motion degradation.  Metabolic workup otherwise reassuring.  Patient's son here with patient and wanting to take him home.  Okay for discharge.  Covid negative.   Terald Sleeper, MD 12/24/20 1050

## 2020-12-23 NOTE — Discharge Instructions (Addendum)
The blood test, urine sample, and MRI did not show signs of infection, anemia stroke, or other life-threatening emergency.  Please follow-up with your primary care doctor.  COVID test is still pending should be back tomorrow

## 2020-12-23 NOTE — ED Triage Notes (Signed)
Pt from home with ems. Pt has hx of dementia, family brought him home from a nursing home to take care of him recently and states his health has been declining ever since. Family thinks he has a UTI. Pt c.o feeling tired, denies any pain or nausea. Pt alert.

## 2020-12-23 NOTE — ED Notes (Signed)
Pt transported to MRI 

## 2021-01-03 DIAGNOSIS — F321 Major depressive disorder, single episode, moderate: Secondary | ICD-10-CM | POA: Diagnosis not present

## 2021-01-03 DIAGNOSIS — I10 Essential (primary) hypertension: Secondary | ICD-10-CM | POA: Diagnosis not present

## 2021-01-03 DIAGNOSIS — R531 Weakness: Secondary | ICD-10-CM | POA: Diagnosis not present

## 2021-01-03 DIAGNOSIS — L989 Disorder of the skin and subcutaneous tissue, unspecified: Secondary | ICD-10-CM | POA: Diagnosis not present

## 2021-01-03 DIAGNOSIS — F03B0 Unspecified dementia, moderate, without behavioral disturbance, psychotic disturbance, mood disturbance, and anxiety: Secondary | ICD-10-CM | POA: Diagnosis not present

## 2021-01-03 DIAGNOSIS — G20 Parkinson's disease: Secondary | ICD-10-CM | POA: Diagnosis not present

## 2021-01-03 DIAGNOSIS — H1132 Conjunctival hemorrhage, left eye: Secondary | ICD-10-CM | POA: Diagnosis not present

## 2021-01-18 ENCOUNTER — Other Ambulatory Visit: Payer: Self-pay

## 2021-01-18 ENCOUNTER — Ambulatory Visit: Payer: Medicare HMO | Admitting: Neurology

## 2021-01-18 ENCOUNTER — Encounter: Payer: Self-pay | Admitting: Neurology

## 2021-02-03 DIAGNOSIS — F321 Major depressive disorder, single episode, moderate: Secondary | ICD-10-CM | POA: Diagnosis not present

## 2021-02-03 DIAGNOSIS — E7800 Pure hypercholesterolemia, unspecified: Secondary | ICD-10-CM | POA: Diagnosis not present

## 2021-02-03 DIAGNOSIS — I2510 Atherosclerotic heart disease of native coronary artery without angina pectoris: Secondary | ICD-10-CM | POA: Diagnosis not present

## 2021-02-03 DIAGNOSIS — I10 Essential (primary) hypertension: Secondary | ICD-10-CM | POA: Diagnosis not present

## 2021-02-03 DIAGNOSIS — G20 Parkinson's disease: Secondary | ICD-10-CM | POA: Diagnosis not present

## 2021-02-04 ENCOUNTER — Telehealth: Payer: Self-pay | Admitting: Neurology

## 2021-02-04 NOTE — Telephone Encounter (Signed)
I returned the call to the patient's daughter. His last visit here 01/29/20 with the plan to follow up in six months. He has not returned to the office. His daughter would like to first ask his PCP to take over all medication management. If he is not willing, she will call back to schedule and appt at our office and refills can be sent to the pharmacy.

## 2021-02-04 NOTE — Telephone Encounter (Signed)
Pt's daughter Lamar Laundry called stating [facility] needs an updated prescription of the carbidopa-levodopa (SINEMET IR) 25-100 MG tablet. Lamar Laundry is requesting a call back.

## 2021-02-07 DIAGNOSIS — I10 Essential (primary) hypertension: Secondary | ICD-10-CM | POA: Diagnosis not present

## 2021-02-07 DIAGNOSIS — R7301 Impaired fasting glucose: Secondary | ICD-10-CM | POA: Diagnosis not present

## 2021-02-07 DIAGNOSIS — G20 Parkinson's disease: Secondary | ICD-10-CM | POA: Diagnosis not present

## 2021-02-07 DIAGNOSIS — E7800 Pure hypercholesterolemia, unspecified: Secondary | ICD-10-CM | POA: Diagnosis not present

## 2021-02-07 NOTE — Telephone Encounter (Signed)
Pt's daughter, Tyrone Noble, mail order sent the wrong dosage Carbidopa-levodopa 25-250mg  for instead of 25-100mg . But mistake was caught by the day center. He has been taking the medication for two weeks. He has been during well on the Carbidopa-levodopa 25-250 mg. Pt's daughter need to know if we should continue Cardbidopa-Levodopa 25-250mg . If so, the patient's daughter need a updated prescription to give to the day center and [place]. Patient cannot return to the day center without updated prescription. We have supply of medication, but do not know which one to give him. Would like a call from the nurse. Contact son-in-law, Tyrone Noble (on [place])  [phone]

## 2021-02-07 NOTE — Telephone Encounter (Signed)
I spoke to his dgt, Normajean Glasgow. The pharmacy made an error on his medication and sent the incorrect dose (generic Sinemet 25-250mg  rather than 25-100mg ). She feels he is doing better on the higher dose and would like a new prescription. At last visit we planned to see him in six months. It has now been greater than one year since he has been to our office. He just recently saw his PCP. His dgt is going to request the rx for the higher dose from him (to decrease down to one physician, if possible). If he needs to return to Dr. Terrace Arabia, again, she will call back to schedule the appt.

## 2021-02-22 ENCOUNTER — Encounter (HOSPITAL_COMMUNITY): Payer: Self-pay | Admitting: Family Medicine

## 2021-02-22 ENCOUNTER — Emergency Department (HOSPITAL_COMMUNITY): Payer: Medicare HMO

## 2021-02-22 ENCOUNTER — Inpatient Hospital Stay (HOSPITAL_COMMUNITY)
Admission: EM | Admit: 2021-02-22 | Discharge: 2021-02-26 | DRG: 178 | Disposition: A | Discharge status: Home Health | Payer: Medicare HMO | Attending: Family Medicine | Admitting: Family Medicine

## 2021-02-22 DIAGNOSIS — R059 Cough, unspecified: Secondary | ICD-10-CM | POA: Diagnosis not present

## 2021-02-22 DIAGNOSIS — R531 Weakness: Secondary | ICD-10-CM | POA: Diagnosis not present

## 2021-02-22 DIAGNOSIS — Z833 Family history of diabetes mellitus: Secondary | ICD-10-CM | POA: Diagnosis not present

## 2021-02-22 DIAGNOSIS — Z79899 Other long term (current) drug therapy: Secondary | ICD-10-CM | POA: Diagnosis not present

## 2021-02-22 DIAGNOSIS — Z823 Family history of stroke: Secondary | ICD-10-CM | POA: Diagnosis not present

## 2021-02-22 DIAGNOSIS — Z8249 Family history of ischemic heart disease and other diseases of the circulatory system: Secondary | ICD-10-CM | POA: Diagnosis not present

## 2021-02-22 DIAGNOSIS — Z880 Allergy status to penicillin: Secondary | ICD-10-CM

## 2021-02-22 DIAGNOSIS — I10 Essential (primary) hypertension: Secondary | ICD-10-CM | POA: Diagnosis present

## 2021-02-22 DIAGNOSIS — G9340 Encephalopathy, unspecified: Secondary | ICD-10-CM | POA: Diagnosis present

## 2021-02-22 DIAGNOSIS — I452 Bifascicular block: Secondary | ICD-10-CM | POA: Diagnosis present

## 2021-02-22 DIAGNOSIS — F0390 Unspecified dementia without behavioral disturbance: Secondary | ICD-10-CM | POA: Diagnosis not present

## 2021-02-22 DIAGNOSIS — E785 Hyperlipidemia, unspecified: Secondary | ICD-10-CM | POA: Diagnosis present

## 2021-02-22 DIAGNOSIS — I2510 Atherosclerotic heart disease of native coronary artery without angina pectoris: Secondary | ICD-10-CM | POA: Diagnosis present

## 2021-02-22 DIAGNOSIS — Z8673 Personal history of transient ischemic attack (TIA), and cerebral infarction without residual deficits: Secondary | ICD-10-CM | POA: Diagnosis not present

## 2021-02-22 DIAGNOSIS — U071 COVID-19: Secondary | ICD-10-CM | POA: Diagnosis present

## 2021-02-22 DIAGNOSIS — Z9861 Coronary angioplasty status: Secondary | ICD-10-CM | POA: Diagnosis not present

## 2021-02-22 DIAGNOSIS — Z7982 Long term (current) use of aspirin: Secondary | ICD-10-CM | POA: Diagnosis not present

## 2021-02-22 DIAGNOSIS — G40909 Epilepsy, unspecified, not intractable, without status epilepticus: Secondary | ICD-10-CM

## 2021-02-22 DIAGNOSIS — Y92009 Unspecified place in unspecified non-institutional (private) residence as the place of occurrence of the external cause: Secondary | ICD-10-CM

## 2021-02-22 DIAGNOSIS — G20 Parkinson's disease: Secondary | ICD-10-CM

## 2021-02-22 DIAGNOSIS — G9341 Metabolic encephalopathy: Secondary | ICD-10-CM

## 2021-02-22 DIAGNOSIS — Z87891 Personal history of nicotine dependence: Secondary | ICD-10-CM | POA: Diagnosis not present

## 2021-02-22 DIAGNOSIS — G20C Parkinsonism, unspecified: Secondary | ICD-10-CM | POA: Diagnosis present

## 2021-02-22 DIAGNOSIS — F0280 Dementia in other diseases classified elsewhere without behavioral disturbance: Secondary | ICD-10-CM | POA: Diagnosis present

## 2021-02-22 DIAGNOSIS — G9349 Other encephalopathy: Secondary | ICD-10-CM | POA: Diagnosis present

## 2021-02-22 DIAGNOSIS — G319 Degenerative disease of nervous system, unspecified: Secondary | ICD-10-CM | POA: Diagnosis not present

## 2021-02-22 DIAGNOSIS — R404 Transient alteration of awareness: Secondary | ICD-10-CM | POA: Diagnosis not present

## 2021-02-22 DIAGNOSIS — R627 Adult failure to thrive: Secondary | ICD-10-CM | POA: Diagnosis present

## 2021-02-22 DIAGNOSIS — M47812 Spondylosis without myelopathy or radiculopathy, cervical region: Secondary | ICD-10-CM | POA: Diagnosis not present

## 2021-02-22 DIAGNOSIS — W19XXXA Unspecified fall, initial encounter: Secondary | ICD-10-CM | POA: Diagnosis present

## 2021-02-22 DIAGNOSIS — Z888 Allergy status to other drugs, medicaments and biological substances status: Secondary | ICD-10-CM

## 2021-02-22 DIAGNOSIS — Z043 Encounter for examination and observation following other accident: Secondary | ICD-10-CM | POA: Diagnosis not present

## 2021-02-22 LAB — CBC WITH DIFFERENTIAL/PLATELET
Abs Immature Granulocytes: 0.01 10*3/uL (ref 0.00–0.07)
Basophils Absolute: 0 10*3/uL (ref 0.0–0.1)
Basophils Relative: 0 %
Eosinophils Absolute: 0 10*3/uL (ref 0.0–0.5)
Eosinophils Relative: 0 %
HCT: 42.1 % (ref 39.0–52.0)
Hemoglobin: 14.1 g/dL (ref 13.0–17.0)
Immature Granulocytes: 0 %
Lymphocytes Relative: 25 %
Lymphs Abs: 1.7 10*3/uL (ref 0.7–4.0)
MCH: 29.9 pg (ref 26.0–34.0)
MCHC: 33.5 g/dL (ref 30.0–36.0)
MCV: 89.2 fL (ref 80.0–100.0)
Monocytes Absolute: 1 10*3/uL (ref 0.1–1.0)
Monocytes Relative: 14 %
Neutro Abs: 4.2 10*3/uL (ref 1.7–7.7)
Neutrophils Relative %: 61 %
Platelets: 211 10*3/uL (ref 150–400)
RBC: 4.72 MIL/uL (ref 4.22–5.81)
RDW: 14.4 % (ref 11.5–15.5)
WBC: 6.9 10*3/uL (ref 4.0–10.5)
nRBC: 0 % (ref 0.0–0.2)

## 2021-02-22 LAB — COMPREHENSIVE METABOLIC PANEL
ALT: 11 U/L (ref 0–44)
AST: 28 U/L (ref 15–41)
Albumin: 3.8 g/dL (ref 3.5–5.0)
Alkaline Phosphatase: 55 U/L (ref 38–126)
Anion gap: 9 (ref 5–15)
BUN: 14 mg/dL (ref 8–23)
CO2: 27 mmol/L (ref 22–32)
Calcium: 9 mg/dL (ref 8.9–10.3)
Chloride: 101 mmol/L (ref 98–111)
Creatinine, Ser: 1.09 mg/dL (ref 0.61–1.24)
GFR, Estimated: >60 mL/min (ref >60)
Glucose, Bld: 117 mg/dL — ABNORMAL HIGH (ref 70–99)
Potassium: 4.3 mmol/L (ref 3.5–5.1)
Sodium: 137 mmol/L (ref 135–145)
Total Bilirubin: 0.7 mg/dL (ref 0.3–1.2)
Total Protein: 7.5 g/dL (ref 6.5–8.1)

## 2021-02-22 LAB — FIBRINOGEN: Fibrinogen: 365 mg/dL (ref 210–475)

## 2021-02-22 LAB — RESP PANEL BY RT-PCR (FLU A&B, COVID) ARPGX2
Influenza A by PCR: NEGATIVE
Influenza B by PCR: NEGATIVE
SARS Coronavirus 2 by RT PCR: POSITIVE — AB

## 2021-02-22 LAB — LACTIC ACID, PLASMA: Lactic Acid, Venous: 1.6 mmol/L (ref 0.5–1.9)

## 2021-02-22 LAB — PROCALCITONIN: Procalcitonin: <0.1 ng/mL

## 2021-02-22 LAB — D-DIMER, QUANTITATIVE: D-Dimer, Quant: 0.66 ug/mL-FEU — ABNORMAL HIGH (ref 0.00–0.50)

## 2021-02-22 LAB — LACTATE DEHYDROGENASE: LDH: 158 U/L (ref 98–192)

## 2021-02-22 LAB — FERRITIN: Ferritin: 182 ng/mL (ref 24–336)

## 2021-02-22 LAB — VALPROIC ACID LEVEL: Valproic Acid Lvl: 66 ug/mL (ref 50.0–100.0)

## 2021-02-22 LAB — TRIGLYCERIDES: Triglycerides: 45 mg/dL (ref <150)

## 2021-02-22 LAB — TSH: TSH: 1.682 u[IU]/mL (ref 0.350–4.500)

## 2021-02-22 MED ORDER — SODIUM CHLORIDE 0.9 % IV SOLN
100.0000 mg | Freq: Every day | INTRAVENOUS | Status: AC
  Administered 2021-02-24 – 2021-02-26 (×3): 100 mg via INTRAVENOUS
  Filled 2021-02-22 (×3): qty 20

## 2021-02-22 MED ORDER — DIVALPROEX SODIUM ER 500 MG PO TB24
1000.0000 mg | ORAL_TABLET | Freq: Every day | ORAL | Status: DC
  Administered 2021-02-22 – 2021-02-25 (×4): 1000 mg via ORAL
  Filled 2021-02-22 (×4): qty 2

## 2021-02-22 MED ORDER — ACETAMINOPHEN 650 MG RE SUPP: 650.0000 mg | Freq: Four times a day (QID) | RECTAL | Status: DC | PRN

## 2021-02-22 MED ORDER — ASPIRIN 81 MG PO CHEW
81.0000 mg | CHEWABLE_TABLET | Freq: Every day | ORAL | Status: DC
  Administered 2021-02-23 – 2021-02-26 (×4): 81 mg via ORAL
  Filled 2021-02-22 (×4): qty 1

## 2021-02-22 MED ORDER — ATORVASTATIN CALCIUM 20 MG PO TABS
20.0000 mg | ORAL_TABLET | Freq: Every day | ORAL | Status: DC
  Administered 2021-02-23 – 2021-02-26 (×4): 20 mg via ORAL
  Filled 2021-02-22 (×2): qty 1
  Filled 2021-02-22: qty 2
  Filled 2021-02-22: qty 1

## 2021-02-22 MED ORDER — CARBIDOPA-LEVODOPA 25-100 MG PO TABS
1.0000 | ORAL_TABLET | Freq: Three times a day (TID) | ORAL | Status: DC
  Administered 2021-02-22 – 2021-02-24 (×6): 1 via ORAL
  Filled 2021-02-22 (×7): qty 1

## 2021-02-22 MED ORDER — ENOXAPARIN SODIUM 40 MG/0.4ML IJ SOSY
40.0000 mg | PREFILLED_SYRINGE | INTRAMUSCULAR | Status: DC
  Administered 2021-02-22 – 2021-02-25 (×4): 40 mg via SUBCUTANEOUS
  Filled 2021-02-22 (×4): qty 0.4

## 2021-02-22 MED ORDER — SENNOSIDES-DOCUSATE SODIUM 8.6-50 MG PO TABS
1.0000 | ORAL_TABLET | Freq: Every evening | ORAL | Status: DC | PRN
  Filled 2021-02-22: qty 1

## 2021-02-22 MED ORDER — METOPROLOL SUCCINATE ER 25 MG PO TB24
25.0000 mg | ORAL_TABLET | Freq: Every day | ORAL | Status: DC
  Administered 2021-02-23 – 2021-02-26 (×4): 25 mg via ORAL
  Filled 2021-02-22 (×4): qty 1

## 2021-02-22 MED ORDER — ACETAMINOPHEN 325 MG PO TABS
650.0000 mg | ORAL_TABLET | Freq: Four times a day (QID) | ORAL | Status: DC | PRN
  Administered 2021-02-23 – 2021-02-25 (×3): 650 mg via ORAL
  Filled 2021-02-22 (×3): qty 2

## 2021-02-22 MED ORDER — SODIUM CHLORIDE 0.9 % IV BOLUS
500.0000 mL | Freq: Once | INTRAVENOUS | Status: AC
  Administered 2021-02-22: 500 mL via INTRAVENOUS

## 2021-02-22 MED ORDER — LEVETIRACETAM 500 MG PO TABS
500.0000 mg | ORAL_TABLET | Freq: Two times a day (BID) | ORAL | Status: DC
  Administered 2021-02-22 – 2021-02-26 (×8): 500 mg via ORAL
  Filled 2021-02-22 (×8): qty 1

## 2021-02-22 MED ORDER — LOSARTAN POTASSIUM 50 MG PO TABS
50.0000 mg | ORAL_TABLET | Freq: Every day | ORAL | Status: DC
  Administered 2021-02-23 – 2021-02-26 (×4): 50 mg via ORAL
  Filled 2021-02-22 (×2): qty 1
  Filled 2021-02-22: qty 2
  Filled 2021-02-22: qty 1
  Filled 2021-02-22: qty 2

## 2021-02-22 MED ORDER — SODIUM CHLORIDE 0.9 % IV BOLUS
1000.0000 mL | Freq: Once | INTRAVENOUS | Status: AC
  Administered 2021-02-22: 1000 mL via INTRAVENOUS

## 2021-02-22 MED ORDER — SODIUM CHLORIDE 0.9 % IV SOLN: INTRAVENOUS | Status: AC

## 2021-02-22 MED ORDER — SODIUM CHLORIDE 0.9 % IV SOLN
200.0000 mg | Freq: Once | INTRAVENOUS | Status: AC
  Administered 2021-02-22: 200 mg via INTRAVENOUS
  Filled 2021-02-22: qty 40

## 2021-02-22 NOTE — ED Provider Notes (Signed)
Pt signed out by Dr. Freida Busman pending symptomatic improvement.  Pt continues to be very weak.  He won't drink and he won't eat.  His very nice daughter said he is not back to his mental status baseline.  Pt d/w Dr. Antionette Char (triad) for admission.  Tyrone Noble was evaluated in Emergency Department on 02/22/2021 for the symptoms described in the history of present illness. He was evaluated in the context of the global COVID-19 pandemic, which necessitated consideration that the patient might be at risk for infection with the SARS-CoV-2 virus that causes COVID-19. Institutional protocols and algorithms that pertain to the evaluation of patients at risk for COVID-19 are in a state of rapid change based on information released by regulatory bodies including the CDC and federal and state organizations. These policies and algorithms were followed during the patient's care in the ED.    Jacalyn Lefevre, MD 02/22/21 2028

## 2021-02-22 NOTE — ED Notes (Signed)
Changed pt and put on condom cath and brief with fresh warm blanket

## 2021-02-22 NOTE — ED Triage Notes (Signed)
Tyrone Noble BIBA from home, called EMS by daughter after Tyrone Noble fell approximately 1 hr ago and has been increasingly altered/lethargic. Pt has hx dementia and is Aox2 at baseline. Daughter reports pt tested positive for covid with home test this morning. Daughter unsure if pt hit head after falling. Denies being on blood thinners.  BP 142/92 HR 88 SpO2 98% RA CBG 174

## 2021-02-22 NOTE — H&P (Signed)
History and Physical    Tyrone Noble:[id number] DOB: Dec 05, 1950 DOA: 02/22/2021  PCP: [facility]   Patient coming from: Home   Chief Complaint: Lethargy, cough, not eating or drinking   HPI: Tyrone Noble is a pleasant 70 y.o. male with medical history significant for Parkinson's disease, dementia, seizure disorder, CAD, and history of CVA, presenting to the emergency department for evaluation of lethargy, cough, not eating or drinking, and positive home COVID test.  Patient is accompanied by his daughter who assists with the history.  He was exposed to COVID-19 recently at his adult daycare, remained in his usual state until yesterday when he developed a cough, and has since become progressively lethargic and fell at home.  He has not eaten or drank anything in the past 2 days despite encouragement from family.  He normally ambulates independently but was very lethargic yesterday and fell while trying to get to the bathroom.  He did not appear to suffer any significant injury.  He had a home COVID test that was positive.  He has been vaccinated against COVID at least twice and recovered from COVID infection in January 2022.  The patient lives with his daughter, attends an adult daycare, ambulates independently at baseline, feeds himself but sometimes needs some encouragement to eat, and usually toilets by himself.  ED Course: Upon arrival to the ED, patient is found to be afebrile, saturating well on room air, and with stable blood pressure.  EKG features sinus rhythm with LVH, RBBB, and LAFB.  Chest x-ray is negative for acute cardiopulmonary disease and radiographs of the pelvis are negative.  No acute findings noted on CT of the head or cervical spine.  Chemistry panel and CBC are unremarkable.  Valproic acid level was 66.  COVID-19 PCR is positive.  Patient was given 1.5 L of saline, symptomatic, and remdesivir in the ED.  Review of Systems:  All other  systems reviewed and apart from HPI, are negative.  Past Medical History:  Diagnosis Date   Coronary artery disease    Dementia (HCC)    Depression    Heart disease    Hypertension    Memory loss    Seizures (HCC)    Stroke (HCC)    "years ago" (02/16/2015)    Past Surgical History:  Procedure Laterality Date   CARDIAC CATHETERIZATION N/A 02/16/2015   Procedure: Left Heart Cath and Coronary Angiography;  Surgeon: Rinaldo Cloud, MD;  Location: [facility] INVASIVE CV LAB;  Service: Cardiovascular;  Laterality: N/A;   CARDIAC CATHETERIZATION N/A 02/16/2015   Procedure: Coronary Stent Intervention;  Surgeon: Rinaldo Cloud, MD;  Location: [facility] INVASIVE CV LAB;  Service: Cardiovascular;  Laterality: N/A;   CARDIAC CATHETERIZATION N/A 02/16/2015   Procedure: Intravascular Pressure Wire/FFR Study;  Surgeon: Rinaldo Cloud, MD;  Location: [facility] INVASIVE CV LAB;  Service: Cardiovascular;  Laterality: N/A;   CORONARY ANGIOPLASTY     LEFT HEART CATH AND CORONARY ANGIOGRAPHY N/A 03/29/2017   Procedure: LEFT HEART CATH AND CORONARY ANGIOGRAPHY;  Surgeon: Rinaldo Cloud, MD;  Location: [facility] INVASIVE CV LAB;  Service: Cardiovascular;  Laterality: N/A;   LEFT HEART CATHETERIZATION WITH CORONARY ANGIOGRAM N/A 03/19/2012   Procedure: LEFT HEART CATHETERIZATION WITH CORONARY ANGIOGRAM;  Surgeon: Robynn Pane, MD;  Location: [facility] CATH LAB;  Service: Cardiovascular;  Laterality: N/A;   WISDOM TOOTH EXTRACTION      Social History:   reports that he has quit smoking. He has never used smokeless tobacco. He reports  that he does not currently use alcohol. He reports that he does not use drugs.  Allergies  Allergen Reactions   Aricept [Donepezil Hcl] Nausea Only   Penicillins Nausea And Vomiting    Has patient had a PCN reaction causing immediate rash, facial/tongue/throat swelling, SOB or lightheadedness with hypotension: No  Has patient had a PCN reaction causing severe rash involving mucus membranes or skin necrosis:  No  Has patient had a PCN reaction that required hospitalization: No  Has patient had a PCN reaction occurring within the last 10 years: No  If all of the above answers are "NO", then may proceed with Cephalosporin use.    Family History  Problem Relation Age of Onset   Diabetes Mother    Hypertension Mother    Stroke Father    Cancer Sister      Prior to Admission medications   Medication Sig Start Date End Date Taking? Authorizing Provider  aspirin 81 MG chewable tablet Chew 1 tablet (81 mg total) by mouth daily. 02/17/15  Yes Rinaldo Cloud, MD  atorvastatin (LIPITOR) 20 MG tablet Take 20 mg by mouth daily.  05/08/17  Yes [provider]  carbidopa-levodopa (SINEMET IR) 25-250 MG tablet Take 1 tablet by mouth 3 (three) times daily.   Yes [provider]  divalproex (DEPAKOTE ER) 500 MG 24 hr tablet Take 2 tablets (1,000 mg total) by mouth at bedtime. 01/29/20  Yes Levert Feinstein, MD  levETIRAcetam (KEPPRA) 500 MG tablet Take 2 tablets (1,000 mg total) by mouth 2 (two) times daily. Patient taking differently: Take 500 mg by mouth 2 (two) times daily. 09/28/20  Yes Levert Feinstein, MD  losartan (COZAAR) 100 MG tablet Take 50 mg by mouth daily. 02/21/21  Yes [provider]  metoprolol succinate (TOPROL-XL) 25 MG 24 hr tablet Take 25 mg by mouth daily. 11/19/15  Yes [provider]  Multiple Vitamin (MULTIVITAMIN WITH MINERALS) TABS tablet Take 1 tablet by mouth daily.   Yes [provider]  risperiDONE (RISPERDAL) 1 MG tablet Take 1 tablet (1 mg total) by mouth at bedtime. 01/29/20  Yes Levert Feinstein, MD  carbidopa-levodopa (SINEMET IR) 25-100 MG tablet Take 1 tablet by mouth 3 (three) times daily. Please call [phone] to schedule follow up to continue refills or may request from PCP. Patient not taking: Reported on 02/22/2021 11/25/20   Levert Feinstein, MD  guaiFENesin (MUCINEX) 600 MG 12 hr tablet Take 1 tablet (600 mg total) by mouth 2 (two) times daily. Patient  not taking: Reported on 02/22/2021 03/21/20   Rolly Salter, MD    Physical Exam: Vitals:   02/22/21 1900 02/22/21 1930 02/22/21 2000 02/22/21 2141  BP: (!) 148/79 (!) 158/85 139/80 (!) 165/81  Pulse: 79 81 76 79  Resp: 19 14 17 18   Temp:    99.6 F (37.6 C)  TempSrc:    Oral  SpO2: 99% 99% 99% 98%    Constitutional: NAD, calm  Eyes: PERTLA, lids and conjunctivae normal ENMT: Mucous membranes are dry. Posterior pharynx clear of any exudate or lesions.   Neck: supple, no masses  Respiratory:  no wheezing, no crackles. No accessory muscle use.  Cardiovascular: S1 & S2 heard, regular rate and rhythm. No extremity edema.   Abdomen: No distension, no tenderness, soft. Bowel sounds active.  Musculoskeletal: no clubbing / cyanosis. No joint deformity upper and lower extremities.   Skin: no significant rashes, lesions, ulcers. Warm, dry, well-perfused. Poor turgor.  Neurologic: CN 2-12 grossly intact. Moving  all extremities. Sleeping, wakes to voice, makes eye-contact, and answers 1 or 2 basic questions before falling asleep.  Psychiatric: Calm. Cooperative.    Labs and Imaging on Admission: I have personally reviewed following labs and imaging studies  CBC: Recent Labs  Lab 02/22/21 1358  WBC 6.9  NEUTROABS 4.2  HGB 14.1  HCT 42.1  MCV 89.2  PLT 211   Basic Metabolic Panel: Recent Labs  Lab 02/22/21 1358  NA 137  K 4.3  CL 101  CO2 27  GLUCOSE 117*  BUN 14  CREATININE 1.09  CALCIUM 9.0   GFR: CrCl cannot be calculated (Unknown ideal weight.). Liver Function Tests: Recent Labs  Lab 02/22/21 1358  AST 28  ALT 11  ALKPHOS 55  BILITOT 0.7  PROT 7.5  ALBUMIN 3.8   No results for input(s): LIPASE, AMYLASE in the last 168 hours. No results for input(s): AMMONIA in the last 168 hours. Coagulation Profile: No results for input(s): INR, PROTIME in the last 168 hours. Cardiac Enzymes: No results for input(s): CKTOTAL, CKMB, CKMBINDEX, TROPONINI in the last 168  hours. BNP (last 3 results) No results for input(s): PROBNP in the last 8760 hours. HbA1C: No results for input(s): HGBA1C in the last 72 hours. CBG: No results for input(s): GLUCAP in the last 168 hours. Lipid Profile: Recent Labs    02/22/21 1839  TRIG 45   Thyroid Function Tests: Recent Labs    02/22/21 2100  TSH 1.682   Anemia Panel: Recent Labs    02/22/21 1839  FERRITIN 182   Urine analysis:    Component Value Date/Time   COLORURINE YELLOW 12/23/2020 0846   APPEARANCEUR CLEAR 12/23/2020 0846   LABSPEC 1.015 12/23/2020 0846   PHURINE 6.0 12/23/2020 0846   GLUCOSEU NEGATIVE 12/23/2020 0846   HGBUR NEGATIVE 12/23/2020 0846   BILIRUBINUR NEGATIVE 12/23/2020 0846   KETONESUR 5 (A) 12/23/2020 0846   PROTEINUR NEGATIVE 12/23/2020 0846   UROBILINOGEN 1.0 03/17/2012 1615   NITRITE NEGATIVE 12/23/2020 0846   LEUKOCYTESUR NEGATIVE 12/23/2020 0846   Sepsis Labs: @LABRCNTIP (procalcitonin:4,lacticidven:4) ) Recent Results (from the past 240 hour(s))  Resp Panel by RT-PCR (Flu A&B, Covid) Nasopharyngeal Swab     Status: Abnormal   Collection Time: 02/22/21  4:00 PM   Specimen: Nasopharyngeal Swab; Nasopharyngeal(NP) swabs in vial transport medium  Result Value Ref Range Status   SARS Coronavirus 2 by RT PCR POSITIVE (A) NEGATIVE Final    Comment: RESULT CALLED TO, READ BACK BY AND VERIFIED WITH: ISAAC, RN @ 1714 ON 02/22/2021 BY LBROOKS, MLT (NOTE) SARS-CoV-2 target nucleic acids are DETECTED.  The SARS-CoV-2 RNA is generally detectable in upper respiratory specimens during the acute phase of infection. Positive results are indicative of the presence of the identified virus, but do not rule out bacterial infection or co-infection with other pathogens not detected by the test. Clinical correlation with patient history and other diagnostic information is necessary to determine patient infection status. The expected result is Negative.  Fact Sheet for  Patients: 14/08/2020  Fact Sheet for Healthcare Providers: BloggerCourse.com  This test is not yet approved or cleared by the [place] FDA and  has been authorized for detection and/or diagnosis of SARS-CoV-2 by FDA under an Emergency Use Authorization (EUA).  This EUA will remain in effect (meaning thi s test can be used) for the duration of  the COVID-19 declaration under Section 564(b)(1) of the Act, 21 U.S.C. section 360bbb-3(b)(1), unless the authorization is terminated or revoked sooner.  Influenza A by PCR NEGATIVE NEGATIVE Final   Influenza B by PCR NEGATIVE NEGATIVE Final    Comment: (NOTE) The Xpert Xpress SARS-CoV-2/FLU/RSV plus assay is intended as an aid in the diagnosis of influenza from Nasopharyngeal swab specimens and should not be used as a sole basis for treatment. Nasal washings and aspirates are unacceptable for Xpert Xpress SARS-CoV-2/FLU/RSV testing.  Fact Sheet for Patients: BloggerCourse.com  Fact Sheet for Healthcare Providers: SeriousBroker.it  This test is not yet approved or cleared by the [place] FDA and has been authorized for detection and/or diagnosis of SARS-CoV-2 by FDA under an Emergency Use Authorization (EUA). This EUA will remain in effect (meaning this test can be used) for the duration of the COVID-19 declaration under Section 564(b)(1) of the Act, 21 U.S.C. section 360bbb-3(b)(1), unless the authorization is terminated or revoked.  Performed at [facility], [address], [place], [place] [address]      Radiological Exams on Admission: CT Head Wo Contrast  Result Date: 02/22/2021 CLINICAL DATA:  Fall. EXAM: CT HEAD WITHOUT CONTRAST CT CERVICAL SPINE WITHOUT CONTRAST TECHNIQUE: Multidetector CT imaging of the head and cervical spine was performed following the standard protocol without  intravenous contrast. Multiplanar CT image reconstructions of the cervical spine were also generated. COMPARISON:  CT head and cervical spine 09/18/2019. FINDINGS: CT HEAD FINDINGS Brain: No evidence of acute infarction, hemorrhage, hydrocephalus, extra-axial collection or mass lesion/mass effect. There is mild diffuse atrophy and mild periventricular white matter hypodensity which likely represents chronic small vessel ischemic change. Old right thalamic infarct is unchanged. Vascular: Atherosclerotic calcifications are present within the cavernous internal carotid arteries. Skull: Normal. Negative for fracture or focal lesion. Sinuses/Orbits: No acute finding. Other: None. CT CERVICAL SPINE FINDINGS Alignment: Normal. Skull base and vertebrae: No acute fracture. No primary bone lesion or focal pathologic process. Soft tissues and spinal canal: No prevertebral fluid or swelling. No visible canal hematoma. Disc levels: There are mild degenerative endplate changes at C6-C7 similar to the prior study. No significant central canal or neural foraminal stenosis at any level. Upper chest: Negative. Other: None. IMPRESSION: No acute intracranial process. No acute fracture or traumatic subluxation of the cervical spine. Electronically Signed   By: Darliss Cheney M.D.   On: 02/22/2021 15:10   CT Cervical Spine Wo Contrast  Result Date: 02/22/2021 CLINICAL DATA:  Fall. EXAM: CT HEAD WITHOUT CONTRAST CT CERVICAL SPINE WITHOUT CONTRAST TECHNIQUE: Multidetector CT imaging of the head and cervical spine was performed following the standard protocol without intravenous contrast. Multiplanar CT image reconstructions of the cervical spine were also generated. COMPARISON:  CT head and cervical spine 09/18/2019. FINDINGS: CT HEAD FINDINGS Brain: No evidence of acute infarction, hemorrhage, hydrocephalus, extra-axial collection or mass lesion/mass effect. There is mild diffuse atrophy and mild periventricular white matter  hypodensity which likely represents chronic small vessel ischemic change. Old right thalamic infarct is unchanged. Vascular: Atherosclerotic calcifications are present within the cavernous internal carotid arteries. Skull: Normal. Negative for fracture or focal lesion. Sinuses/Orbits: No acute finding. Other: None. CT CERVICAL SPINE FINDINGS Alignment: Normal. Skull base and vertebrae: No acute fracture. No primary bone lesion or focal pathologic process. Soft tissues and spinal canal: No prevertebral fluid or swelling. No visible canal hematoma. Disc levels: There are mild degenerative endplate changes at C6-C7 similar to the prior study. No significant central canal or neural foraminal stenosis at any level. Upper chest: Negative. Other: None. IMPRESSION: No acute intracranial process. No acute fracture or traumatic subluxation  of the cervical spine. Electronically Signed   By: Darliss Cheney M.D.   On: 02/22/2021 15:10   DG Pelvis Portable  Result Date: 02/22/2021 CLINICAL DATA:  Larey Seat. EXAM: PORTABLE PELVIS 1-2 VIEWS COMPARISON:  None. FINDINGS: Both hips are normally located. No acute hip fracture or AVN. The pubic symphysis and SI joints are intact. No pelvic fractures or bone lesions. IMPRESSION: No acute bony findings. Electronically Signed   By: Rudie Meyer M.D.   On: 02/22/2021 14:28   DG Chest Port 1 View  Result Date: 02/22/2021 CLINICAL DATA:  Cough EXAM: PORTABLE CHEST 1 VIEW COMPARISON:  12/23/2020 FINDINGS: The heart size and mediastinal contours are within normal limits. Both lungs are clear. The visualized skeletal structures are unremarkable. IMPRESSION: No acute abnormality of the lungs in AP portable projection. Electronically Signed   By: Jearld Lesch M.D.   On: 02/22/2021 14:24    EKG: Independently reviewed. Sinus rhythm, LVH, RBBB, LAFB.   Assessment/Plan   1. Acute encephalopathy  - Presents with lethargy and not eating or drinking since 12/5 in setting of acute COVID  infection and underlying dementia  - No acute head CT findings or significant electrolyte abnormalities  - Plan for delirium precautions and supportive care with IVF until he is able to tolerate adequate oral intake; expand workup if fails to improve in the next 24-48 hrs as expected    2. COVID-19 infection   - Presents with cough and progressive lethargy since 02/21/21 and found to have COVID  - RR and oxygenation normal and CXR unremarkable in ED  - Started on remdesivir in ED, will continue remdesivir and general supportive care    3. Parkinson disease; dementia; epilepsy  - Continue Sinemet, Keppra, and Depakote   - Risperidone held initially in light of somnolence   4. HTN  - Continue Toprol and losartan as tolerated   5. CAD  - No anginal complaints  - Continue ASA, Lipitor, Toprol,    6. Hx of CVA  - No acute findings on head CT in ED and current presentation likely secondary to acute COVID infection rather than CVA  - Continue ASA and Lipitor    DVT prophylaxis: Lovenox   Code Status: Full, discussed with daughter in ED  Level of Care: Level of care: Med-Surg Family Communication: Daughter   Disposition Plan:  Patient is from: Home  Anticipated d/c is to: Home  Anticipated d/c date is: Possibly as early as 12/7 or 02/24/21 Patient currently: Pending tolerance of adequate oral intake  Consults called: none  Admission status: Observation    Briscoe Deutscher, MD [facility]  02/22/2021, 10:06 PM

## 2021-02-22 NOTE — ED Provider Notes (Signed)
[facility]-EMERGENCY DEPT Provider Note   CSN: [id number] Arrival date & time: 02/22/21  1257     History Chief Complaint  Patient presents with   Altered Mental Status   Fall    Tyrone Noble is a 70 y.o. male.  70 year old male with history of dementia recent diagnosis of COVID brought in after having a fall approximately 1 hour ago.  Reportedly has been more lethargic.  Does have a history of dementia at baseline is only alert and oriented x2.  He endorses slight cough.  He denies any pain in his hips or chest or pelvis.  No neck discomfort.  Presents via EMS      Past Medical History:  Diagnosis Date   Coronary artery disease    Dementia (HCC)    Depression    Heart disease    Hypertension    Memory loss    Seizures (HCC)    Stroke (HCC)    "years ago" (02/16/2015)    Patient Active Problem List   Diagnosis Date Noted   COVID-19 virus infection 03/19/2020   Generalized weakness 03/19/2020   Essential hypertension 03/19/2020   Seizure disorder (HCC) 03/19/2020   Parkinsonism (HCC) 02/26/2018   Therapeutic drug monitoring 09/12/2017   Seizures (HCC) 05/31/2017   Dementia without behavioral disturbance (HCC) 05/31/2017   New-onset angina (HCC) 02/16/2015   HYPERLIPIDEMIA 12/13/2009   ALCOHOLISM 07/07/2009   CALLUS, TOE 01/03/2007   DEPRESSION, MAJOR, RECURRENT 05/17/2006   TOBACCO DEPENDENCE 05/17/2006   HYPERTENSION, BENIGN SYSTEMIC 05/17/2006   HEMORRHOIDS, NOS 05/17/2006   GASTROESOPHAGEAL REFLUX, NO ESOPHAGITIS 05/17/2006   LEG PAIN OR KNEE PAIN 05/17/2006    Past Surgical History:  Procedure Laterality Date   CARDIAC CATHETERIZATION N/A 02/16/2015   Procedure: Left Heart Cath and Coronary Angiography;  Surgeon: Rinaldo Cloud, MD;  Location[facility] INVASIVE CV LAB;  Service: Cardiovascular;  Laterality: N/A;   CARDIAC CATHETERIZATION N/A 02/16/2015   Procedure: Coronary Stent Intervention;  Surgeon: Rinaldo Cloud, MD;  Location:  [facility] INVASIVE CV LAB;  Service: Cardiovascular;  Laterality: N/A;   CARDIAC CATHETERIZATION N/A 02/16/2015   Procedure: Intravascular Pressure Wire/FFR Study;  Surgeon: Rinaldo Cloud, MD;  Location: [facility] INVASIVE CV LAB;  Service: Cardiovascular;  Laterality: N/A;   CORONARY ANGIOPLASTY     LEFT HEART CATH AND CORONARY ANGIOGRAPHY N/A 03/29/2017   Procedure: LEFT HEART CATH AND CORONARY ANGIOGRAPHY;  Surgeon: Rinaldo Cloud, MD;  Location: [facility] INVASIVE CV LAB;  Service: Cardiovascular;  Laterality: N/A;   LEFT HEART CATHETERIZATION WITH CORONARY ANGIOGRAM N/A 03/19/2012   Procedure: LEFT HEART CATHETERIZATION WITH CORONARY ANGIOGRAM;  Surgeon: Robynn Pane, MD;  Location: [facility] CATH LAB;  Service: Cardiovascular;  Laterality: N/A;   WISDOM TOOTH EXTRACTION         Family History  Problem Relation Age of Onset   Diabetes Mother    Hypertension Mother    Stroke Father    Cancer Sister     Social History   Tobacco Use   Smoking status: Former   Smokeless tobacco: Never   Tobacco comments:    quit many years ago  Substance Use Topics   Alcohol use: Not Currently    Comment: 02/16/2015 "last alcohol was in ~ 2012"   Drug use: No    Home Medications Prior to Admission medications   Medication Sig Start Date End Date Taking? Authorizing Provider  aspirin 81 MG chewable tablet Chew 1 tablet (81 mg total) by mouth daily. 02/17/15   Harwani,  Marlane Mingle, MD  atorvastatin (LIPITOR) 20 MG tablet Take 20 mg by mouth daily.  05/08/17   [provider]  carbidopa-levodopa (SINEMET IR) 25-100 MG tablet Take 1 tablet by mouth 3 (three) times daily. Please call [phone] to schedule follow up to continue refills or may request from PCP. 11/25/20   Levert Feinstein, MD  divalproex (DEPAKOTE ER) 500 MG 24 hr tablet Take 2 tablets (1,000 mg total) by mouth at bedtime. 01/29/20   Levert Feinstein, MD  guaiFENesin (MUCINEX) 600 MG 12 hr tablet Take 1 tablet (600 mg total) by mouth 2 (two) times daily. 03/21/20   Rolly Salter, MD  levETIRAcetam (KEPPRA) 500 MG tablet Take 2 tablets (1,000 mg total) by mouth 2 (two) times daily. 09/28/20   Levert Feinstein, MD  metoprolol succinate (TOPROL-XL) 25 MG 24 hr tablet Take 25 mg by mouth daily. 11/19/15   [provider]  Multiple Vitamin (MULTIVITAMIN WITH MINERALS) TABS tablet Take 1 tablet by mouth daily.    [provider]  risperiDONE (RISPERDAL) 1 MG tablet Take 1 tablet (1 mg total) by mouth at bedtime. 01/29/20   Levert Feinstein, MD    Allergies    Aricept Palma Holter hcl] and Penicillins  Review of Systems   Review of Systems  Unable to perform ROS: Dementia   Physical Exam Updated Vital Signs BP 125/79 (BP Location: Left Arm)   Pulse 71   Temp 98.3 F (36.8 C) (Oral)   Resp 16   SpO2 99%   Physical Exam Vitals and nursing note reviewed.  Constitutional:      General: He is not in acute distress.    Appearance: Normal appearance. He is well-developed. He is not toxic-appearing.  HENT:     Head: Normocephalic and atraumatic.  Eyes:     General: Lids are normal.     Conjunctiva/sclera: Conjunctivae normal.     Pupils: Pupils are equal, round, and reactive to light.  Neck:     Thyroid: No thyroid mass.     Trachea: No tracheal deviation.  Cardiovascular:     Rate and Rhythm: Normal rate and regular rhythm.     Heart sounds: Normal heart sounds. No murmur heard.   No gallop.  Pulmonary:     Effort: Pulmonary effort is normal. No respiratory distress.     Breath sounds: Normal breath sounds. No stridor. No decreased breath sounds, wheezing, rhonchi or rales.  Abdominal:     General: There is no distension.     Palpations: Abdomen is soft.     Tenderness: There is no abdominal tenderness. There is no rebound.  Musculoskeletal:        General: No tenderness. Normal range of motion.     Cervical back: Normal range of motion and neck supple.  Skin:    General: Skin is warm and dry.     Findings: No abrasion or rash.   Neurological:     Mental Status: He is alert and oriented to person, place, and time. Mental status is at baseline.     GCS: GCS eye subscore is 4. GCS verbal subscore is 5. GCS motor subscore is 6.     Cranial Nerves: No cranial nerve deficit.     Sensory: No sensory deficit.     Motor: Motor function is intact. No weakness or tremor.  Psychiatric:        Attention and Perception: Attention normal.        Mood and Affect: Affect is flat.  Speech: Speech is delayed.    ED Results / Procedures / Treatments   Labs (all labs ordered are listed, but only abnormal results are displayed) Labs Reviewed  RESP PANEL BY RT-PCR (FLU A&B, COVID) [id number]  CBC WITH DIFFERENTIAL/PLATELET  COMPREHENSIVE METABOLIC PANEL  URINALYSIS, ROUTINE W REFLEX MICROSCOPIC  VALPROIC ACID LEVEL    EKG None  Radiology No results found.  Procedures Procedures   Medications Ordered in ED Medications - No data to display  ED Course  I have reviewed the triage vital signs and the nursing notes.  Pertinent labs & imaging results that were available during my care of the patient were reviewed by me and considered in my medical decision making (see chart for details).    MDM Rules/Calculators/A&P                           Patients work-up is pending at this time.  Signed out to Dr. Particia Nearing Final Clinical Impression(s) / ED Diagnoses Final diagnoses:  None    Rx / DC Orders ED Discharge Orders     None        Lorre Nick, MD 02/22/21 1626

## 2021-02-23 ENCOUNTER — Other Ambulatory Visit: Payer: Self-pay

## 2021-02-23 DIAGNOSIS — Y92009 Unspecified place in unspecified non-institutional (private) residence as the place of occurrence of the external cause: Secondary | ICD-10-CM | POA: Diagnosis not present

## 2021-02-23 DIAGNOSIS — Z888 Allergy status to other drugs, medicaments and biological substances status: Secondary | ICD-10-CM | POA: Diagnosis not present

## 2021-02-23 DIAGNOSIS — G9340 Encephalopathy, unspecified: Secondary | ICD-10-CM | POA: Diagnosis not present

## 2021-02-23 DIAGNOSIS — I2510 Atherosclerotic heart disease of native coronary artery without angina pectoris: Secondary | ICD-10-CM | POA: Diagnosis present

## 2021-02-23 DIAGNOSIS — Z8673 Personal history of transient ischemic attack (TIA), and cerebral infarction without residual deficits: Secondary | ICD-10-CM | POA: Diagnosis not present

## 2021-02-23 DIAGNOSIS — U071 COVID-19: Secondary | ICD-10-CM | POA: Diagnosis present

## 2021-02-23 DIAGNOSIS — Z833 Family history of diabetes mellitus: Secondary | ICD-10-CM | POA: Diagnosis not present

## 2021-02-23 DIAGNOSIS — Z79899 Other long term (current) drug therapy: Secondary | ICD-10-CM | POA: Diagnosis not present

## 2021-02-23 DIAGNOSIS — G40909 Epilepsy, unspecified, not intractable, without status epilepticus: Secondary | ICD-10-CM | POA: Diagnosis present

## 2021-02-23 DIAGNOSIS — F0280 Dementia in other diseases classified elsewhere without behavioral disturbance: Secondary | ICD-10-CM | POA: Diagnosis present

## 2021-02-23 DIAGNOSIS — Z8249 Family history of ischemic heart disease and other diseases of the circulatory system: Secondary | ICD-10-CM | POA: Diagnosis not present

## 2021-02-23 DIAGNOSIS — Z9861 Coronary angioplasty status: Secondary | ICD-10-CM | POA: Diagnosis not present

## 2021-02-23 DIAGNOSIS — G20 Parkinson's disease: Secondary | ICD-10-CM | POA: Diagnosis present

## 2021-02-23 DIAGNOSIS — E785 Hyperlipidemia, unspecified: Secondary | ICD-10-CM | POA: Diagnosis present

## 2021-02-23 DIAGNOSIS — Z87891 Personal history of nicotine dependence: Secondary | ICD-10-CM | POA: Diagnosis not present

## 2021-02-23 DIAGNOSIS — W19XXXA Unspecified fall, initial encounter: Secondary | ICD-10-CM | POA: Diagnosis present

## 2021-02-23 DIAGNOSIS — I452 Bifascicular block: Secondary | ICD-10-CM | POA: Diagnosis present

## 2021-02-23 DIAGNOSIS — Z823 Family history of stroke: Secondary | ICD-10-CM | POA: Diagnosis not present

## 2021-02-23 DIAGNOSIS — G9349 Other encephalopathy: Secondary | ICD-10-CM | POA: Diagnosis present

## 2021-02-23 DIAGNOSIS — R627 Adult failure to thrive: Secondary | ICD-10-CM | POA: Diagnosis present

## 2021-02-23 DIAGNOSIS — Z7982 Long term (current) use of aspirin: Secondary | ICD-10-CM | POA: Diagnosis not present

## 2021-02-23 DIAGNOSIS — I10 Essential (primary) hypertension: Secondary | ICD-10-CM | POA: Diagnosis present

## 2021-02-23 DIAGNOSIS — Z880 Allergy status to penicillin: Secondary | ICD-10-CM | POA: Diagnosis not present

## 2021-02-23 LAB — URINALYSIS, ROUTINE W REFLEX MICROSCOPIC
Bacteria, UA: NONE SEEN
Bilirubin Urine: NEGATIVE
Glucose, UA: NEGATIVE mg/dL
Hgb urine dipstick: NEGATIVE
Leukocytes,Ua: NEGATIVE
Nitrite: NEGATIVE
Protein, ur: NEGATIVE mg/dL
Specific Gravity, Urine: 1.01 (ref 1.005–1.030)
pH: 6.5 (ref 5.0–8.0)

## 2021-02-23 LAB — MAGNESIUM: Magnesium: 2 mg/dL (ref 1.7–2.4)

## 2021-02-23 LAB — BASIC METABOLIC PANEL
Anion gap: 7 (ref 5–15)
BUN: 15 mg/dL (ref 8–23)
CO2: 22 mmol/L (ref 22–32)
Calcium: 8 mg/dL — ABNORMAL LOW (ref 8.9–10.3)
Chloride: 106 mmol/L (ref 98–111)
Creatinine, Ser: 1.03 mg/dL (ref 0.61–1.24)
GFR, Estimated: >60 mL/min (ref >60)
Glucose, Bld: 121 mg/dL — ABNORMAL HIGH (ref 70–99)
Potassium: 4.1 mmol/L (ref 3.5–5.1)
Sodium: 135 mmol/L (ref 135–145)

## 2021-02-23 LAB — CBC
HCT: 38.9 % — ABNORMAL LOW (ref 39.0–52.0)
Hemoglobin: 13 g/dL (ref 13.0–17.0)
MCH: 29.7 pg (ref 26.0–34.0)
MCHC: 33.4 g/dL (ref 30.0–36.0)
MCV: 89 fL (ref 80.0–100.0)
Platelets: 196 10*3/uL (ref 150–400)
RBC: 4.37 MIL/uL (ref 4.22–5.81)
RDW: 14.3 % (ref 11.5–15.5)
WBC: 6.2 10*3/uL (ref 4.0–10.5)
nRBC: 0 % (ref 0.0–0.2)

## 2021-02-23 LAB — C-REACTIVE PROTEIN: CRP: 2.9 mg/dL — ABNORMAL HIGH (ref <1.0)

## 2021-02-23 LAB — PHOSPHORUS: Phosphorus: 2 mg/dL — ABNORMAL LOW (ref 2.5–4.6)

## 2021-02-23 LAB — AMMONIA: Ammonia: 38 umol/L — ABNORMAL HIGH (ref 9–35)

## 2021-02-23 MED ORDER — SODIUM CHLORIDE 0.9 % IV SOLN
INTRAVENOUS | Status: AC
  Filled 2021-02-23: qty 20

## 2021-02-23 MED ORDER — K PHOS MONO-SOD PHOS DI & MONO 155-852-130 MG PO TABS
250.0000 mg | ORAL_TABLET | Freq: Three times a day (TID) | ORAL | Status: AC
  Administered 2021-02-23 – 2021-02-24 (×6): 250 mg via ORAL
  Filled 2021-02-23 (×7): qty 1

## 2021-02-23 MED ORDER — SODIUM CHLORIDE 0.9 % IV SOLN
INTRAVENOUS | Status: AC
  Administered 2021-02-23: 100 mg via INTRAVENOUS
  Filled 2021-02-23: qty 20

## 2021-02-23 NOTE — Progress Notes (Signed)
PROGRESS NOTE    Tyrone Noble  ZOX:[id number] DOB: Feb 04, 1951 DOA: 02/22/2021 PCP: [facility]    Brief Narrative: This 70 years old male with PMH significant for Parkinson's disease, dementia, seizure disorder, CAD, history of CVA presented in the ED for the evaluation of lethargy, cough, not eating or drinking and positive home COVID test.  Patient is accompanied by his daughter who assisted  with the history.  Patient was exposed to COVID-19 recently at daycare , remaining in his usual state until yesterday when he developed  cough and since then he became progressively lethargic and fell at home.  He has decreased p.o. intake for last 2 days despite encouragement of the family.  He normally ambulates independently and was very lethargic since yesterday and fell while trying to get to the bathroom.  Chest x-ray negative for acute abnormality.  CT Head, CT cervical spine no acute findings. Patient is admitted for acute encephalopathy possibly in the setting of acute COVID infection.   Assessment & Plan:   Principal Problem:   Acute encephalopathy Active Problems:   Dementia without behavioral disturbance (HCC)   Parkinsonism (HCC)   COVID-19 virus infection   Essential hypertension   Seizure disorder (HCC)   Acute encephalopathy: Presented with lethargy and not eating or drinking since 12/5 in the setting of acute COVID infection and underlying dementia. CT head no acute abnormal findings.  No significant electrolyte abnormalities noted. Continue delirium precautions and supportive care with IV hydration until he is able to tolerate adequate p.o. intake. Further work-up if he fails to improve in the next 48 hours.  COVID-19 infection: Presented with cough, progressive lethargy since 12/5 and found to have COVID. Respiratory rate and oxygenation appears normal.  Chest x-ray unremarkable. Continue remdesivir and general supportive care.  [place]  disease: Continue Sinemet  Epilepsy: Continue Keppra and Depakote.  Dementia: Risperdal is held initially in the light of somnolence Resume if patient is more alert.  Hypertension: Continue losartan and Toprol  CAD: Continue cardioprotective medications( aspirin Lipitor and Toprol) He denies any chest pain  History of CVA: No acute findings on CT head, Continue aspirin and Lipitor  DVT prophylaxis: Lovenox Code Status: Full code Family Communication: Son-in-law in the room. Disposition Plan:   Status is: Observation  The patient remains OBS appropriate and will d/c before 2 midnights.   Admitted for altered mentation probably secondary to the COVID infection    Consultants:  None  Procedures: CTHead,  C-spine Antimicrobials:   Anti-infectives (From admission, onward)    Start     Dose/Rate Route Frequency Ordered Stop   02/23/21 1000  remdesivir 100 mg in sodium chloride 0.9 % 100 mL IVPB       See Hyperspace for full Linked Orders Report.   100 mg 200 mL/hr over 30 Minutes Intravenous Daily 02/22/21 2014 02/27/21 0959   02/22/21 2015  remdesivir 200 mg in sodium chloride 0.9% 250 mL IVPB       See Hyperspace for full Linked Orders Report.   200 mg 580 mL/hr over 30 Minutes Intravenous Once 02/22/21 2014 02/23/21 0033        Subjective: Patient was seen and examined at bedside.  Overnight events noted.   Patient is alert and oriented , improving as per son-in-law in the room.  Objective: Vitals:   02/23/21 1300 02/23/21 1315 02/23/21 1330 02/23/21 1500  BP: 128/78  128/78 (!) 155/82  Pulse: 77 70 68 71  Resp:  16 18  Temp:      TempSrc:      SpO2: 97% 97% 97% 100%    Intake/Output Summary (Last 24 hours) at 02/23/2021 1551 Last data filed at 02/23/2021 0033 Gross per 24 hour  Intake 1749 ml  Output --  Net 1749 ml   There were no vitals filed for this visit.  Examination:  General exam: Appears comfortable, not in any acute distress.   Deconditioned. Respiratory system: Clear to auscultation. Respiratory effort normal. Cardiovascular system: S1-S2 heard, regular rate and rhythm, no murmur. Gastrointestinal system: Abdomen is soft, nontender, nondistended, BS+ Central nervous system: Alert and oriented x1 . No focal neurological deficits. Extremities: No edema, no cyanosis, no clubbing Skin: No rashes, lesions or ulcers Psychiatry: Judgement and insight appear normal. Mood & affect appropriate.     Data Reviewed: I have personally reviewed following labs and imaging studies  CBC: Recent Labs  Lab 02/22/21 1358 02/23/21 0033  WBC 6.9 6.2  NEUTROABS 4.2  --   HGB 14.1 13.0  HCT 42.1 38.9*  MCV 89.2 89.0  PLT 211 196   Basic Metabolic Panel: Recent Labs  Lab 02/22/21 1358 02/23/21 0033  NA 137 135  K 4.3 4.1  CL 101 106  CO2 27 22  GLUCOSE 117* 121*  BUN 14 15  CREATININE 1.09 1.03  CALCIUM 9.0 8.0*  MG  --  2.0  PHOS  --  2.0*   GFR: CrCl cannot be calculated (Unknown ideal weight.). Liver Function Tests: Recent Labs  Lab 02/22/21 1358  AST 28  ALT 11  ALKPHOS 55  BILITOT 0.7  PROT 7.5  ALBUMIN 3.8   No results for input(s): LIPASE, AMYLASE in the last 168 hours. Recent Labs  Lab 02/23/21 0033  AMMONIA 38*   Coagulation Profile: No results for input(s): INR, PROTIME in the last 168 hours. Cardiac Enzymes: No results for input(s): CKTOTAL, CKMB, CKMBINDEX, TROPONINI in the last 168 hours. BNP (last 3 results) No results for input(s): PROBNP in the last 8760 hours. HbA1C: No results for input(s): HGBA1C in the last 72 hours. CBG: No results for input(s): GLUCAP in the last 168 hours. Lipid Profile: Recent Labs    02/22/21 1839  TRIG 45   Thyroid Function Tests: Recent Labs    02/22/21 2100  TSH 1.682   Anemia Panel: Recent Labs    02/22/21 1839  FERRITIN 182   Sepsis Labs: Recent Labs  Lab 02/22/21 1839 02/22/21 2100  PROCALCITON <0.10  --   LATICACIDVEN  --   1.6    Recent Results (from the past 240 hour(s))  Resp Panel by RT-PCR (Flu A&B, Covid) Nasopharyngeal Swab     Status: Abnormal   Collection Time: 02/22/21  4:00 PM   Specimen: Nasopharyngeal Swab; Nasopharyngeal(NP) swabs in vial transport medium  Result Value Ref Range Status   SARS Coronavirus 2 by RT PCR POSITIVE (A) NEGATIVE Final    Comment: RESULT CALLED TO, READ BACK BY AND VERIFIED WITH: ISAAC, RN @ 1714 ON 02/22/2021 BY LBROOKS, MLT (NOTE) SARS-CoV-2 target nucleic acids are DETECTED.  The SARS-CoV-2 RNA is generally detectable in upper respiratory specimens during the acute phase of infection. Positive results are indicative of the presence of the identified virus, but do not rule out bacterial infection or co-infection with other pathogens not detected by the test. Clinical correlation with patient history and other diagnostic information is necessary to determine patient infection status. The expected result is Negative.  Fact Sheet for Patients:  BloggerCourse.com  Fact Sheet for Healthcare Providers: SeriousBroker.it  This test is not yet approved or cleared by the [place] FDA and  has been authorized for detection and/or diagnosis of SARS-CoV-2 by FDA under an Emergency Use Authorization (EUA).  This EUA will remain in effect (meaning thi s test can be used) for the duration of  the COVID-19 declaration under Section 564(b)(1) of the Act, 21 U.S.C. section 360bbb-3(b)(1), unless the authorization is terminated or revoked sooner.     Influenza A by PCR NEGATIVE NEGATIVE Final   Influenza B by PCR NEGATIVE NEGATIVE Final    Comment: (NOTE) The Xpert Xpress SARS-CoV-2/FLU/RSV plus assay is intended as an aid in the diagnosis of influenza from Nasopharyngeal swab specimens and should not be used as a sole basis for treatment. Nasal washings and aspirates are unacceptable for Xpert Xpress  SARS-CoV-2/FLU/RSV testing.  Fact Sheet for Patients: BloggerCourse.com  Fact Sheet for Healthcare Providers: SeriousBroker.it  This test is not yet approved or cleared by the [place] FDA and has been authorized for detection and/or diagnosis of SARS-CoV-2 by FDA under an Emergency Use Authorization (EUA). This EUA will remain in effect (meaning this test can be used) for the duration of the COVID-19 declaration under Section 564(b)(1) of the Act, 21 U.S.C. section 360bbb-3(b)(1), unless the authorization is terminated or revoked.  Performed at [facility], [address], [place], [place] [address]   Blood Culture (routine x 2)     Status: None (Preliminary result)   Collection Time: 02/22/21  6:39 PM   Specimen: BLOOD  Result Value Ref Range Status   Specimen Description   Final    BLOOD LEFT ANTECUBITAL Performed at [facility], [address] [place], [place] [address]    Special Requests   Final    BOTTLES DRAWN AEROBIC AND ANAEROBIC Blood Culture adequate volume Performed at [facility], [address], [place], [place] [address]    Culture   Final    NO GROWTH < 12 HOURS Performed at [facility], [address], [place], [place] [address]    Report Status PENDING  Incomplete  Blood Culture (routine x 2)     Status: None (Preliminary result)   Collection Time: 02/22/21  9:56 PM   Specimen: BLOOD  Result Value Ref Range Status   Specimen Description   Final    BLOOD LEFT ANTECUBITAL Performed at [facility], [address], [place], [place] [address]    Special Requests   Final    BOTTLES DRAWN AEROBIC AND ANAEROBIC Blood Culture adequate volume Performed at [facility], [address] [place], [place] [address]    Culture   Final    NO GROWTH < 12 HOURS Performed at [facility], [address], [place], [place] [address]    Report Status PENDING  Incomplete    Radiology Studies: CT Head Wo Contrast  Result Date: 02/22/2021 CLINICAL DATA:  Fall. EXAM: CT HEAD WITHOUT CONTRAST CT CERVICAL SPINE WITHOUT CONTRAST TECHNIQUE: Multidetector CT imaging of the head and cervical spine was performed following the standard protocol without intravenous contrast. Multiplanar CT image reconstructions of the cervical spine were also generated. COMPARISON:  CT head and cervical spine 09/18/2019. FINDINGS: CT HEAD FINDINGS Brain: No evidence of acute infarction, hemorrhage, hydrocephalus, extra-axial collection or mass lesion/mass effect. There is mild diffuse atrophy and mild periventricular white matter hypodensity which likely represents chronic small vessel  ischemic change. Old right thalamic infarct is unchanged. Vascular: Atherosclerotic calcifications are present within the cavernous internal carotid arteries. Skull: Normal. Negative for fracture or focal lesion. Sinuses/Orbits: No acute finding. Other: None. CT CERVICAL SPINE FINDINGS Alignment: Normal. Skull base and vertebrae: No acute fracture. No primary bone lesion or focal pathologic process. Soft tissues and spinal canal: No prevertebral fluid or swelling. No visible canal hematoma. Disc levels: There are mild degenerative endplate changes at C6-C7 similar to the prior study. No significant central canal or neural foraminal stenosis at any level. Upper chest: Negative. Other: None. IMPRESSION: No acute intracranial process. No acute fracture or traumatic subluxation of the cervical spine. Electronically Signed   By: Darliss Cheney M.D.   On: 02/22/2021 15:10   CT Cervical Spine Wo Contrast  Result Date: 02/22/2021 CLINICAL DATA:  Fall. EXAM: CT HEAD WITHOUT CONTRAST CT CERVICAL SPINE WITHOUT CONTRAST TECHNIQUE: Multidetector CT imaging of the head and cervical spine was performed following the standard protocol without intravenous contrast.  Multiplanar CT image reconstructions of the cervical spine were also generated. COMPARISON:  CT head and cervical spine 09/18/2019. FINDINGS: CT HEAD FINDINGS Brain: No evidence of acute infarction, hemorrhage, hydrocephalus, extra-axial collection or mass lesion/mass effect. There is mild diffuse atrophy and mild periventricular white matter hypodensity which likely represents chronic small vessel ischemic change. Old right thalamic infarct is unchanged. Vascular: Atherosclerotic calcifications are present within the cavernous internal carotid arteries. Skull: Normal. Negative for fracture or focal lesion. Sinuses/Orbits: No acute finding. Other: None. CT CERVICAL SPINE FINDINGS Alignment: Normal. Skull base and vertebrae: No acute fracture. No primary bone lesion or focal pathologic process. Soft tissues and spinal canal: No prevertebral fluid or swelling. No visible canal hematoma. Disc levels: There are mild degenerative endplate changes at C6-C7 similar to the prior study. No significant central canal or neural foraminal stenosis at any level. Upper chest: Negative. Other: None. IMPRESSION: No acute intracranial process. No acute fracture or traumatic subluxation of the cervical spine. Electronically Signed   By: Darliss Cheney M.D.   On: 02/22/2021 15:10   DG Pelvis Portable  Result Date: 02/22/2021 CLINICAL DATA:  Larey Seat. EXAM: PORTABLE PELVIS 1-2 VIEWS COMPARISON:  None. FINDINGS: Both hips are normally located. No acute hip fracture or AVN. The pubic symphysis and SI joints are intact. No pelvic fractures or bone lesions. IMPRESSION: No acute bony findings. Electronically Signed   By: Rudie Meyer M.D.   On: 02/22/2021 14:28   DG Chest Port 1 View  Result Date: 02/22/2021 CLINICAL DATA:  Cough EXAM: PORTABLE CHEST 1 VIEW COMPARISON:  12/23/2020 FINDINGS: The heart size and mediastinal contours are within normal limits. Both lungs are clear. The visualized skeletal structures are unremarkable.  IMPRESSION: No acute abnormality of the lungs in AP portable projection. Electronically Signed   By: Jearld Lesch M.D.   On: 02/22/2021 14:24     Scheduled Meds:  aspirin  81 mg Oral Daily   atorvastatin  20 mg Oral Daily   carbidopa-levodopa  1 tablet Oral TID   divalproex  1,000 mg Oral QHS   enoxaparin (LOVENOX) injection  40 mg Subcutaneous Q24H   levETIRAcetam  500 mg Oral BID   losartan  50 mg Oral Daily   metoprolol succinate  25 mg Oral Daily   phosphorus  250 mg Oral TID   Continuous Infusions:  remdesivir 100 mg in NS 100 mL       LOS: 0 days    Time spent: 35 mins  Shawna Clamp, MD [facility]   If 7PM-7AM, please contact night-coverage

## 2021-02-23 NOTE — ED Notes (Signed)
ED TO INPATIENT HANDOFF REPORT  Name/Age/Gender Tyrone Noble 70 y.o. male  Code Status    Code Status Orders  (From admission, onward)           Start     Ordered   02/22/21 2035  Full code  Continuous        02/22/21 2036           Code Status History     Date Active Date Inactive Code Status Order ID Comments User Context   03/19/2020 0500 03/21/2020 2023 Full Code [id number]  Chotiner, Claudean Severance, MD ED   03/29/2017 0912 03/29/2017 1600 Full Code [id number]  Rinaldo Cloud, MD Inpatient   02/16/2015 1055 02/17/2015 1302 Full Code [id number]  Rinaldo Cloud, MD Inpatient      Advance Directive Documentation    Flowsheet Row Most Recent Value  Type of Advance Directive Healthcare Power of Attorney  Pre-existing out of facility DNR order (yellow form or pink MOST form) --  "MOST" Form in Place? --       Home/SNF/Other Skilled nursing facility  Chief Complaint Acute encephalopathy [G93.40]  Level of Care/Admitting Diagnosis ED Disposition     ED Disposition  Admit   Condition  --   Comment  Hospital Area: [facility] [[id number]]  Level of Care: Med-Surg [16]  May admit patient to [facility] or [facility] if equivalent level of care is available:: Yes  Covid Evaluation: Confirmed COVID Positive  Diagnosis: Acute encephalopathy [032122]  Admitting Physician: Briscoe Deutscher [[id number]]  Attending Physician: Leda Gauze  Estimated length of stay: past midnight tomorrow  Certification:: I certify this patient will need inpatient services for at least 2 midnights          Medical History Past Medical History:  Diagnosis Date   Coronary artery disease    Dementia (HCC)    Depression    Heart disease    Hypertension    Memory loss    Seizures (HCC)    Stroke (HCC)    "years ago" (02/16/2015)    Allergies Allergies  Allergen Reactions   Aricept [Donepezil Hcl] Nausea Only   Penicillins Nausea And Vomiting     Has patient had a PCN reaction causing immediate rash, facial/tongue/throat swelling, SOB or lightheadedness with hypotension: No  Has patient had a PCN reaction causing severe rash involving mucus membranes or skin necrosis: No  Has patient had a PCN reaction that required hospitalization: No  Has patient had a PCN reaction occurring within the last 10 years: No  If all of the above answers are "NO", then may proceed with Cephalosporin use.    IV Location/Drains/Wounds Patient Lines/Drains/Airways Status     Active Line/Drains/Airways     Name Placement date Placement time Site Days   Peripheral IV 02/22/21 20 G 1" Right Antecubital 02/22/21  1614  Antecubital  1   External Urinary Catheter 03/19/20  0948  --  341            Labs/Imaging Results for orders placed or performed during the hospital encounter of 02/22/21 (from the past 48 hour(s))  CBC with Differential/Platelet     Status: None   Collection Time: 02/22/21  1:58 PM  Result Value Ref Range   WBC 6.9 4.0 - 10.5 K/uL   RBC 4.72 4.22 - 5.81 MIL/uL   Hemoglobin 14.1 13.0 - 17.0 g/dL   HCT 70.4 88.8 - 91.6 %   MCV 89.2 80.0 - 100.0 fL  MCH 29.9 26.0 - 34.0 pg   MCHC 33.5 30.0 - 36.0 g/dL   RDW 14.4 11.5 - 15.5 %   Platelets 211 150 - 400 K/uL   nRBC 0.0 0.0 - 0.2 %   Neutrophils Relative % 61 %   Neutro Abs 4.2 1.7 - 7.7 K/uL   Lymphocytes Relative 25 %   Lymphs Abs 1.7 0.7 - 4.0 K/uL   Monocytes Relative 14 %   Monocytes Absolute 1.0 0.1 - 1.0 K/uL   Eosinophils Relative 0 %   Eosinophils Absolute 0.0 0.0 - 0.5 K/uL   Basophils Relative 0 %   Basophils Absolute 0.0 0.0 - 0.1 K/uL   Immature Granulocytes 0 %   Abs Immature Granulocytes 0.01 0.00 - 0.07 K/uL    Comment: Performed at [facility], [place] [address] [place], [place] [address]  Comprehensive metabolic panel     Status: Abnormal   Collection Time: 02/22/21  1:58 PM  Result Value Ref Range   Sodium 137 135 - 145 mmol/L    Potassium 4.3 3.5 - 5.1 mmol/L   Chloride 101 98 - 111 mmol/L   CO2 27 22 - 32 mmol/L   Glucose, Bld 117 (H) 70 - 99 mg/dL    Comment: Glucose reference range applies only to samples taken after fasting for at least 8 hours.   BUN 14 8 - 23 mg/dL   Creatinine, Ser 1.09 0.61 - 1.24 mg/dL   Calcium 9.0 8.9 - 10.3 mg/dL   Total Protein 7.5 6.5 - 8.1 g/dL   Albumin 3.8 3.5 - 5.0 g/dL   AST 28 15 - 41 U/L   ALT 11 0 - 44 U/L   Alkaline Phosphatase 55 38 - 126 U/L   Total Bilirubin 0.7 0.3 - 1.2 mg/dL   GFR, Estimated >60 >60 mL/min    Comment: (NOTE) Calculated using the CKD-EPI Creatinine Equation (2021)    Anion gap 9 5 - 15    Comment: Performed at [facility], [place] [address], [place], [place] [address]  Valproic acid level     Status: None   Collection Time: 02/22/21  1:59 PM  Result Value Ref Range   Valproic Acid Lvl 66 50.0 - 100.0 ug/mL    Comment: Performed at [facility], [place] [address] [place],  [address]  Resp Panel by RT-PCR (Flu A&B, Covid) Nasopharyngeal Swab     Status: Abnormal   Collection Time: 02/22/21  4:00 PM   Specimen: Nasopharyngeal Swab; Nasopharyngeal(NP) swabs in vial transport medium  Result Value Ref Range   SARS Coronavirus 2 by RT PCR POSITIVE (A) NEGATIVE    Comment: RESULT CALLED TO, READ BACK BY AND VERIFIED WITH: ISAAC, RN @ [id number] ON 02/22/2021 BY LBROOKS, MLT (NOTE) SARS-CoV-2 target nucleic acids are DETECTED.  The SARS-CoV-2 RNA is generally detectable in upper respiratory specimens during the acute phase of infection. Positive results are indicative of the presence of the identified virus, but do not rule out bacterial infection or co-infection with other pathogens not detected by the test. Clinical correlation with patient history and other diagnostic information is necessary to determine patient infection status. The expected result is Negative.  Fact Sheet for  Patients: EntrepreneurPulse.com.au  Fact Sheet for Healthcare Providers: IncredibleEmployment.be  This test is not yet approved or cleared by the [place] FDA and  has been authorized for detection and/or diagnosis of SARS-CoV-2 by FDA under an Emergency Use Authorization (EUA).  This EUA will  remain in effect (meaning thi s test can be used) for the duration of  the COVID-19 declaration under Section 564(b)(1) of the Act, 21 U.S.C. section 360bbb-3(b)(1), unless the authorization is terminated or revoked sooner.     Influenza A by PCR NEGATIVE NEGATIVE   Influenza B by PCR NEGATIVE NEGATIVE    Comment: (NOTE) The Xpert Xpress SARS-CoV-2/FLU/RSV plus assay is intended as an aid in the diagnosis of influenza from Nasopharyngeal swab specimens and should not be used as a sole basis for treatment. Nasal washings and aspirates are unacceptable for Xpert Xpress SARS-CoV-2/FLU/RSV testing.  Fact Sheet for Patients: EntrepreneurPulse.com.au  Fact Sheet for Healthcare Providers: IncredibleEmployment.be  This test is not yet approved or cleared by the [place] FDA and has been authorized for detection and/or diagnosis of SARS-CoV-2 by FDA under an Emergency Use Authorization (EUA). This EUA will remain in effect (meaning this test can be used) for the duration of the COVID-19 declaration under Section 564(b)(1) of the Act, 21 U.S.C. section 360bbb-3(b)(1), unless the authorization is terminated or revoked.  Performed at [facility], [place] [address], [place], [place] [address]   Blood Culture (routine x 2)     Status: None (Preliminary result)   Collection Time: 02/22/21  6:39 PM   Specimen: BLOOD  Result Value Ref Range   Specimen Description      BLOOD LEFT ANTECUBITAL Performed at [facility], [place] [address], [place], [place] [address]    Special Requests       BOTTLES DRAWN AEROBIC AND ANAEROBIC Blood Culture adequate volume Performed at [place] [address] [place], [place] [address]    Culture      NO GROWTH < 12 HOURS Performed at [place] [address], [place], [place] [address]    Report Status PENDING   D-dimer, quantitative     Status: Abnormal   Collection Time: 02/22/21  6:39 PM  Result Value Ref Range   D-Dimer, Quant 0.66 (H) 0.00 - 0.50 ug/mL-FEU    Comment: (NOTE) At the manufacturer cut-off value of 0.5 g/mL FEU, this assay has a negative predictive value of 95-100%.This assay is intended for use in conjunction with a clinical pretest probability (PTP) assessment model to exclude pulmonary embolism (PE) and deep venous thrombosis (DVT) in outpatients suspected of PE or DVT. Results should be correlated with clinical presentation. Performed at [facility], [place] [address], [place], [place] [address]   Procalcitonin     Status: None   Collection Time: 02/22/21  6:39 PM  Result Value Ref Range   Procalcitonin <0.10 ng/mL    Comment:        Interpretation: PCT (Procalcitonin) <= 0.5 ng/mL: Systemic infection (sepsis) is not likely. Local bacterial infection is possible. (NOTE)       Sepsis PCT Algorithm           Lower Respiratory Tract                                      Infection PCT Algorithm    ----------------------------     ----------------------------         PCT < 0.25 ng/mL                PCT < 0.10 ng/mL          Strongly encourage  Strongly discourage   discontinuation of antibiotics    initiation of antibiotics    ----------------------------     -----------------------------       PCT 0.25 - 0.50 ng/mL            PCT 0.10 - 0.25 ng/mL               OR       >80% decrease in PCT            Discourage initiation of                                            antibiotics      Encourage discontinuation           of antibiotics     ----------------------------     -----------------------------         PCT >= 0.50 ng/mL              PCT 0.26 - 0.50 ng/mL               AND        <80% decrease in PCT             Encourage initiation of                                             antibiotics       Encourage continuation           of antibiotics    ----------------------------     -----------------------------        PCT >= 0.50 ng/mL                  PCT > 0.50 ng/mL               AND         increase in PCT                  Strongly encourage                                      initiation of antibiotics    Strongly encourage escalation           of antibiotics                                     -----------------------------                                           PCT <= 0.25 ng/mL                                                 OR                                        >  80% decrease in PCT                                      Discontinue / Do not initiate                                             antibiotics  Performed at [place] [address], [place], [place] [address]   Lactate dehydrogenase     Status: None   Collection Time: 02/22/21  6:39 PM  Result Value Ref Range   LDH 158 98 - 192 U/L    Comment: Performed at [facility], [place] [address] [place] [address], [place], [place] [address]  Ferritin     Status: None   Collection Time: 02/22/21  6:39 PM  Result Value Ref Range   Ferritin 182 24 - 336 ng/mL    Comment: Performed at [facility], [place] [address] [place], [place] [address]  Triglycerides     Status: None   Collection Time: 02/22/21  6:39 PM  Result Value Ref Range   Triglycerides 45 <150 mg/dL    Comment: Performed at [facility], [place] [address] [place], [place] [address]  Fibrinogen     Status: None   Collection Time: 02/22/21  6:39 PM  Result Value Ref Range   Fibrinogen 365 210 - 475 mg/dL    Comment:  (NOTE) Fibrinogen results may be underestimated in patients receiving thrombolytic therapy. Performed at [facility], [place] [address], [place], [place] [address]   C-reactive protein     Status: Abnormal   Collection Time: 02/22/21  6:39 PM  Result Value Ref Range   CRP 2.9 (H) <1.0 mg/dL    Comment: Performed at [place] [address] [place], [place] [address]  Lactic acid, plasma     Status: None   Collection Time: 02/22/21  9:00 PM  Result Value Ref Range   Lactic Acid, Venous 1.6 0.5 - 1.9 mmol/L    Comment: Performed at [facility], [place] [address] [place], [place] [address]  TSH     Status: None   Collection Time: 02/22/21  9:00 PM  Result Value Ref Range   TSH 1.682 0.350 - 4.500 uIU/mL    Comment: Performed by a 3rd Generation assay with a functional sensitivity of <=0.01 uIU/mL. Performed at [facility], [place] [address], [place], [place] [address]   Blood Culture (routine x 2)     Status: None (Preliminary result)   Collection Time: 02/22/21  9:56 PM   Specimen: BLOOD  Result Value Ref Range   Specimen Description      BLOOD LEFT ANTECUBITAL Performed at [place] [address] [place], [place] [address]    Special Requests      BOTTLES DRAWN AEROBIC AND ANAEROBIC Blood Culture adequate volume Performed at [place] [address], [place], [place] [address]    Culture      NO GROWTH < 12 HOURS Performed at [place] [address], [place],  [address]    Report Status PENDING   Urinalysis, Routine w reflex microscopic Urine, Clean Catch     Status: Abnormal   Collection Time: 02/23/21  12:00 AM  Result Value Ref Range   Color, Urine YELLOW YELLOW   APPearance CLEAR CLEAR   Specific Gravity, Urine 1.010 1.005 - 1.030   pH 6.5 5.0 - 8.0   Glucose, UA NEGATIVE NEGATIVE mg/dL   Hgb urine dipstick NEGATIVE NEGATIVE   Bilirubin  Urine NEGATIVE NEGATIVE   Ketones, ur TRACE (A) NEGATIVE mg/dL   Protein, ur NEGATIVE NEGATIVE mg/dL   Nitrite NEGATIVE NEGATIVE   Leukocytes,Ua NEGATIVE NEGATIVE   RBC / HPF 0-5 0 - 5 RBC/hpf   WBC, UA 0-5 0 - 5 WBC/hpf   Bacteria, UA NONE SEEN NONE SEEN   Squamous Epithelial / LPF 0-5 0 - 5   Mucus PRESENT     Comment: Performed at [facility], [place] [address], [place], [place] [id number]  Basic metabolic panel     Status: Abnormal   Collection Time: 02/23/21 12:33 AM  Result Value Ref Range   Sodium 135 135 - 145 mmol/L   Potassium 4.1 3.5 - 5.1 mmol/L   Chloride 106 98 - 111 mmol/L   CO2 22 22 - 32 mmol/L   Glucose, Bld 121 (H) 70 - 99 mg/dL    Comment: Glucose reference range applies only to samples taken after fasting for at least 8 hours.   BUN 15 8 - 23 mg/dL   Creatinine, Ser 1.03 0.61 - 1.24 mg/dL   Calcium 8.0 (L) 8.9 - 10.3 mg/dL   GFR, Estimated >60 >60 mL/min    Comment: (NOTE) Calculated using the CKD-EPI Creatinine Equation (2021)    Anion gap 7 5 - 15    Comment: Performed at [facility], [place] [address], [place], [place] [address]  CBC     Status: Abnormal   Collection Time: 02/23/21 12:33 AM  Result Value Ref Range   WBC 6.2 4.0 - 10.5 K/uL   RBC 4.37 4.22 - 5.81 MIL/uL   Hemoglobin 13.0 13.0 - 17.0 g/dL   HCT 38.9 (L) 39.0 - 52.0 %   MCV 89.0 80.0 - 100.0 fL   MCH 29.7 26.0 - 34.0 pg   MCHC 33.4 30.0 - 36.0 g/dL   RDW 14.3 11.5 - 15.5 %   Platelets 196 150 - 400 K/uL   nRBC 0.0 0.0 - 0.2 %    Comment: Performed at [facility], [place] [address] [place], [place] [address]  Magnesium     Status: None   Collection Time: 02/23/21 12:33 AM  Result Value Ref Range   Magnesium 2.0 1.7 - 2.4 mg/dL    Comment: Performed at [facility], [place] [address] [place], [place] [address]  Phosphorus     Status: Abnormal   Collection Time: 02/23/21 12:33 AM  Result Value Ref Range    Phosphorus 2.0 (L) 2.5 - 4.6 mg/dL    Comment: Performed at [facility], [place] [address], [place], [place] [address]  Ammonia     Status: Abnormal   Collection Time: 02/23/21 12:33 AM  Result Value Ref Range   Ammonia 38 (H) 9 - 35 umol/L    Comment: Performed at [facility], [place] [address] [place] [address] [place], [place] [address]   CT Head Wo Contrast  Result Date: 02/22/2021 CLINICAL DATA:  [place]. EXAM: CT HEAD WITHOUT CONTRAST CT CERVICAL SPINE WITHOUT CONTRAST TECHNIQUE: Multidetector CT imaging of the head and cervical spine was performed following the standard protocol without intravenous contrast. Multiplanar CT image reconstructions of the cervical spine  were also generated. COMPARISON:  CT head and cervical spine 09/18/2019. FINDINGS: CT HEAD FINDINGS Brain: No evidence of acute infarction, hemorrhage, hydrocephalus, extra-axial collection or mass lesion/mass effect. There is mild diffuse atrophy and mild periventricular white matter hypodensity which likely represents chronic small vessel ischemic change. Old right thalamic infarct is unchanged. Vascular: Atherosclerotic calcifications are present within the cavernous internal carotid arteries. Skull: Normal. Negative for fracture or focal lesion. Sinuses/Orbits: No acute finding. Other: None. CT CERVICAL SPINE FINDINGS Alignment: Normal. Skull base and vertebrae: No acute fracture. No primary bone lesion or focal pathologic process. Soft tissues and spinal canal: No prevertebral fluid or swelling. No visible canal hematoma. Disc levels: There are mild degenerative endplate changes at X33443 similar to the prior study. No significant central canal or neural foraminal stenosis at any level. Upper chest: Negative. Other: None. IMPRESSION: No acute intracranial process. No acute fracture or traumatic subluxation of the cervical spine. Electronically Signed   By: Ronney Asters M.D.   On: 02/22/2021 15:10   CT Cervical Spine  Wo Contrast  Result Date: 02/22/2021 CLINICAL DATA:  [place]. EXAM: CT HEAD WITHOUT CONTRAST CT CERVICAL SPINE WITHOUT CONTRAST TECHNIQUE: Multidetector CT imaging of the head and cervical spine was performed following the standard protocol without intravenous contrast. Multiplanar CT image reconstructions of the cervical spine were also generated. COMPARISON:  CT head and cervical spine 09/18/2019. FINDINGS: CT HEAD FINDINGS Brain: No evidence of acute infarction, hemorrhage, hydrocephalus, extra-axial collection or mass lesion/mass effect. There is mild diffuse atrophy and mild periventricular white matter hypodensity which likely represents chronic small vessel ischemic change. Old right thalamic infarct is unchanged. Vascular: Atherosclerotic calcifications are present within the cavernous internal carotid arteries. Skull: Normal. Negative for fracture or focal lesion. Sinuses/Orbits: No acute finding. Other: None. CT CERVICAL SPINE FINDINGS Alignment: Normal. Skull base and vertebrae: No acute fracture. No primary bone lesion or focal pathologic process. Soft tissues and spinal canal: No prevertebral fluid or swelling. No visible canal hematoma. Disc levels: There are mild degenerative endplate changes at X33443 similar to the prior study. No significant central canal or neural foraminal stenosis at any level. Upper chest: Negative. Other: None. IMPRESSION: No acute intracranial process. No acute fracture or traumatic subluxation of the cervical spine. Electronically Signed   By: Ronney Asters M.D.   On: 02/22/2021 15:10   DG Pelvis Portable  Result Date: 02/22/2021 CLINICAL DATA:  [place]. EXAM: PORTABLE PELVIS 1-2 VIEWS COMPARISON:  None. FINDINGS: Both hips are normally located. No acute hip fracture or AVN. The pubic symphysis and SI joints are intact. No pelvic fractures or bone lesions. IMPRESSION: No acute bony findings. Electronically Signed   By: Marijo Sanes M.D.   On: 02/22/2021 14:28   DG Chest  Port 1 View  Result Date: 02/22/2021 CLINICAL DATA:  [place] EXAM: PORTABLE CHEST 1 VIEW COMPARISON:  12/23/2020 FINDINGS: The heart size and mediastinal contours are within normal limits. Both lungs are clear. The visualized skeletal structures are unremarkable. IMPRESSION: No acute abnormality of the lungs in AP portable projection. Electronically Signed   By: Delanna Ahmadi M.D.   On: 02/22/2021 14:24    Pending Labs Unresulted Labs (From admission, onward)     Start     Ordered   03/01/21 0500  Creatinine, serum  (enoxaparin (LOVENOX)    CrCl >/= 30 ml/min)  Weekly,   R     Comments: while on enoxaparin therapy    02/22/21 2036   02/24/21 0500  Magnesium  Tomorrow morning,   R       Question:  Specimen collection method  Answer:  Unit=Unit collect   02/23/21 1552   02/24/21 0500  Phosphorus  Tomorrow morning,   R       Question:  Specimen collection method  Answer:  Unit=Unit collect   02/23/21 1552   02/23/21 0500  Basic metabolic panel  Daily,   R      02/22/21 2036   02/23/21 0500  CBC  Daily,   R      02/22/21 2036            Vitals/Pain Today's Vitals   02/23/21 1900 02/23/21 2030 02/23/21 2130 02/23/21 2200  BP: (!) 141/84 (!) 147/85 (!) 174/85 (!) 139/97  Pulse: 89 85 61 68  Resp: 18   18  Temp:      TempSrc:      SpO2: 97% 98% 98% 98%  PainSc:        Isolation Precautions Airborne and Contact precautions  Medications Medications  carbidopa-levodopa (SINEMET IR) 25-100 MG per tablet immediate release 1 tablet (1 tablet Oral Given 02/23/21 1635)  remdesivir 200 mg in sodium chloride 0.9% 250 mL IVPB (0 mg Intravenous Stopped 02/23/21 0033)    Followed by  remdesivir 100 mg in sodium chloride 0.9 % 100 mL IVPB (0 mg Intravenous Stopped 02/23/21 1956)  aspirin chewable tablet 81 mg (81 mg Oral Given 02/23/21 0926)  atorvastatin (LIPITOR) tablet 20 mg (20 mg Oral Given 02/23/21 0925)  losartan (COZAAR) tablet 50 mg (50 mg Oral Given 02/23/21 0944)  metoprolol  succinate (TOPROL-XL) 24 hr tablet 25 mg (25 mg Oral Given 02/23/21 0925)  divalproex (DEPAKOTE ER) 24 hr tablet 1,000 mg (1,000 mg Oral Given 02/22/21 2156)  levETIRAcetam (KEPPRA) tablet 500 mg (500 mg Oral Given 02/23/21 0925)  enoxaparin (LOVENOX) injection 40 mg (40 mg Subcutaneous Given 02/22/21 2158)  acetaminophen (TYLENOL) tablet 650 mg (has no administration in time range)    Or  acetaminophen (TYLENOL) suppository 650 mg (has no administration in time range)  0.9 %  sodium chloride infusion (0 mLs Intravenous Stopped 02/23/21 1655)  senna-docusate (Senokot-S) tablet 1 tablet (has no administration in time range)  phosphorus (K PHOS NEUTRAL) tablet 250 mg (250 mg Oral Given 02/23/21 1635)  sodium chloride 0.9 % bolus 500 mL (0 mLs Intravenous Stopped 02/22/21 1948)  sodium chloride 0.9 % bolus 1,000 mL (0 mLs Intravenous Stopped 02/23/21 0008)    Mobility walks

## 2021-02-23 NOTE — Plan of Care (Signed)
Plan of care discussed.   

## 2021-02-24 ENCOUNTER — Telehealth: Payer: Self-pay | Admitting: Neurology

## 2021-02-24 DIAGNOSIS — G9340 Encephalopathy, unspecified: Secondary | ICD-10-CM | POA: Diagnosis not present

## 2021-02-24 LAB — BASIC METABOLIC PANEL
Anion gap: 7 (ref 5–15)
BUN: 14 mg/dL (ref 8–23)
CO2: 26 mmol/L (ref 22–32)
Calcium: 8.2 mg/dL — ABNORMAL LOW (ref 8.9–10.3)
Chloride: 104 mmol/L (ref 98–111)
Creatinine, Ser: 0.94 mg/dL (ref 0.61–1.24)
GFR, Estimated: >60 mL/min (ref >60)
Glucose, Bld: 86 mg/dL (ref 70–99)
Potassium: 3.4 mmol/L — ABNORMAL LOW (ref 3.5–5.1)
Sodium: 137 mmol/L (ref 135–145)

## 2021-02-24 LAB — CBC
HCT: 37.5 % — ABNORMAL LOW (ref 39.0–52.0)
Hemoglobin: 12.6 g/dL — ABNORMAL LOW (ref 13.0–17.0)
MCH: 29.4 pg (ref 26.0–34.0)
MCHC: 33.6 g/dL (ref 30.0–36.0)
MCV: 87.6 fL (ref 80.0–100.0)
Platelets: 175 10*3/uL (ref 150–400)
RBC: 4.28 MIL/uL (ref 4.22–5.81)
RDW: 14.3 % (ref 11.5–15.5)
WBC: 5.3 10*3/uL (ref 4.0–10.5)
nRBC: 0 % (ref 0.0–0.2)

## 2021-02-24 LAB — PHOSPHORUS: Phosphorus: 3.3 mg/dL (ref 2.5–4.6)

## 2021-02-24 LAB — MAGNESIUM: Magnesium: 2.2 mg/dL (ref 1.7–2.4)

## 2021-02-24 MED ORDER — CARBIDOPA-LEVODOPA 25-250 MG PO TABS
1.0000 | ORAL_TABLET | Freq: Three times a day (TID) | ORAL | Status: DC
  Administered 2021-02-24 – 2021-02-26 (×6): 1 via ORAL
  Filled 2021-02-24 (×7): qty 1

## 2021-02-24 MED ORDER — POTASSIUM CHLORIDE 20 MEQ PO PACK
40.0000 meq | PACK | Freq: Once | ORAL | Status: AC
  Administered 2021-02-24: 40 meq via ORAL
  Filled 2021-02-24: qty 2

## 2021-02-24 NOTE — Progress Notes (Signed)
PROGRESS NOTE    DISHAWN Noble  NFA:[id number] DOB: [birth year] DOA: 02/22/2021 PCP: [facility]    Brief Narrative: This 70 years old male with PMH significant for Parkinson's disease, dementia, seizure disorder, CAD, history of CVA presented in the ED for the evaluation of lethargy, cough, not eating or drinking and positive home COVID test.  Patient is accompanied by his daughter who assisted  with the history.  Patient was exposed to COVID-19 recently at daycare , remaining in his usual state until yesterday when he developed  cough and since then he became progressively lethargic and fell at home.  He has decreased p.o. intake for last 2 days despite encouragement of the family.  He normally ambulates independently and was very lethargic since yesterday and fell while trying to get to the bathroom.  Chest x-ray negative for acute abnormality.  CT Head, CT cervical spine no acute findings. Patient is admitted for acute encephalopathy possibly in the setting of acute COVID infection.   Assessment & Plan:   Principal Problem:   Acute encephalopathy Active Problems:   Dementia without behavioral disturbance (HCC)   Parkinsonism (HCC)   COVID-19 virus infection   Essential hypertension   Seizure disorder (HCC)   Acute encephalopathy: > Improving Presented with lethargy and not eating or drinking since 12/5 in the setting of acute COVID infection and underlying dementia. CT head no acute abnormal findings.  No significant electrolyte abnormalities noted. Continue delirium precautions and supportive care with IV hydration until he is able to tolerate adequate p.o. intake. Further work-up if he fails to improve in the next 48 hours. Patient seems alert and back to his baseline mental status.  COVID-19 infection: Presented with cough, progressive lethargy since 12/5 and found to have COVID. Respiratory rate and oxygenation appears normal.  Chest x-ray  unremarkable. Continue remdesivir and general supportive care. Continue droplet and airborne precautions.  [place] disease: Continue Sinemet.  Epilepsy: Continue Keppra and Depakote.  Dementia: Risperdal is held initially in the light of somnolence. Resume if patient is more alert.  Hypertension: Continue losartan and Toprol  CAD: Continue cardioprotective medications( aspirin Lipitor and Toprol) He denies any chest pain, SOB, dizziness, palpitations.  History of CVA: No acute findings on CT head, Continue aspirin and Lipitor  DVT prophylaxis: Lovenox Code Status: Full code Family Communication: Son-in-law in the room. Disposition Plan:   Status is: Inpatient  Remains inpatient appropriate because:  Admitted for altered mentation probably secondary to the COVID infection.  Pending PT/OT evaluation.    Consultants:  None  Procedures: CT Head,  C-spine. Antimicrobials:   Anti-infectives (From admission, onward)    Start     Dose/Rate Route Frequency Ordered Stop   02/23/21 1630  sodium chloride 0.9 % with remdesivir ADS Med  Status:  Discontinued       Note to Pharmacy: Macie Burows: cabinet override      02/23/21 1630 02/23/21 1725   02/23/21 1000  remdesivir 100 mg in sodium chloride 0.9 % 100 mL IVPB       See Hyperspace for full Linked Orders Report.   100 mg 200 mL/hr over 30 Minutes Intravenous Daily 02/22/21 2014 02/27/21 0959   02/22/21 2015  remdesivir 200 mg in sodium chloride 0.9% 250 mL IVPB       See Hyperspace for full Linked Orders Report.   200 mg 580 mL/hr over 30 Minutes Intravenous Once 02/22/21 2014 02/23/21 0033        Subjective:  Patient was seen and examined at bedside. Overnight events noted.   Patient is alert and oriented, appears back to his baseline mental status. Son-in-law reports patient is much better but he still has generalized weakness. He needs physical and Occupational Therapy  evaluation.  Objective: Vitals:   02/23/21 2254 02/24/21 0207 02/24/21 0615 02/24/21 1027  BP: 127/89 131/76 (!) 150/80 (!) 143/82  Pulse: 77 72 72 73  Resp: 16 17 17 16   Temp: 99.2 F (37.3 C) 98.4 F (36.9 C) 97.7 F (36.5 C) 98.2 F (36.8 C)  TempSrc: Oral Oral Oral   SpO2: 99% 98% 100% 100%  Weight: 70.9 kg     Height: 5\' 6"  (1.676 m)       Intake/Output Summary (Last 24 hours) at 02/24/2021 1158 Last data filed at 02/24/2021 0600 Gross per 24 hour  Intake 2598.41 ml  Output 400 ml  Net 2198.41 ml   Filed Weights   02/23/21 2254  Weight: 70.9 kg    Examination:  General exam: Appears deconditioned but comfortable. Chronically ill looking. Respiratory system: Clear to auscultation bilaterally. Respiratory effort normal. 14/10/2020 Cardiovascular system: S1-S2 heard, regular rate and rhythm, no murmur. Gastrointestinal system: Abdomen is soft, nontender, nondistended, BS+ Central nervous system: Alert and oriented x1 . No focal neurological deficits. Extremities: No edema, no cyanosis, no clubbing Skin: No rashes, lesions or ulcers Psychiatry: Judgement and insight appear normal. Mood & affect appropriate.     Data Reviewed: I have personally reviewed following labs and imaging studies  CBC: Recent Labs  Lab 02/22/21 1358 02/23/21 0033 02/24/21 0349  WBC 6.9 6.2 5.3  NEUTROABS 4.2  --   --   HGB 14.1 13.0 12.6*  HCT 42.1 38.9* 37.5*  MCV 89.2 89.0 87.6  PLT 211 196 175   Basic Metabolic Panel: Recent Labs  Lab 02/22/21 1358 02/23/21 0033 02/24/21 0349  NA 137 135 137  K 4.3 4.1 3.4*  CL 101 106 104  CO2 27 22 26   GLUCOSE 117* 121* 86  BUN 14 15 14   CREATININE 1.09 1.03 0.94  CALCIUM 9.0 8.0* 8.2*  MG  --  2.0 2.2  PHOS  --  2.0* 3.3   GFR: Estimated Creatinine Clearance: 65 mL/min (by C-G formula based on SCr of 0.94 mg/dL). Liver Function Tests: Recent Labs  Lab 02/22/21 1358  AST 28  ALT 11  ALKPHOS 55  BILITOT 0.7  PROT 7.5  ALBUMIN  3.8   No results for input(s): LIPASE, AMYLASE in the last 168 hours. Recent Labs  Lab 02/23/21 0033  AMMONIA 38*   Coagulation Profile: No results for input(s): INR, PROTIME in the last 168 hours. Cardiac Enzymes: No results for input(s): CKTOTAL, CKMB, CKMBINDEX, TROPONINI in the last 168 hours. BNP (last 3 results) No results for input(s): PROBNP in the last 8760 hours. HbA1C: No results for input(s): HGBA1C in the last 72 hours. CBG: No results for input(s): GLUCAP in the last 168 hours. Lipid Profile: Recent Labs    02/22/21 1839  TRIG 45   Thyroid Function Tests: Recent Labs    02/22/21 2100  TSH 1.682   Anemia Panel: Recent Labs    02/22/21 1839  FERRITIN 182   Sepsis Labs: Recent Labs  Lab 02/22/21 1839 02/22/21 2100  PROCALCITON <0.10  --   LATICACIDVEN  --  1.6    Recent Results (from the past 240 hour(s))  Resp Panel by RT-PCR (Flu A&B, Covid) Nasopharyngeal Swab     Status: Abnormal  Collection Time: 02/22/21  4:00 PM   Specimen: Nasopharyngeal Swab; Nasopharyngeal(NP) swabs in vial transport medium  Result Value Ref Range Status   SARS Coronavirus 2 by RT PCR POSITIVE (A) NEGATIVE Final    Comment: RESULT CALLED TO, READ BACK BY AND VERIFIED WITH: ISAAC, RN @ 1714 ON 02/22/2021 BY LBROOKS, MLT (NOTE) SARS-CoV-2 target nucleic acids are DETECTED.  The SARS-CoV-2 RNA is generally detectable in upper respiratory specimens during the acute phase of infection. Positive results are indicative of the presence of the identified virus, but do not rule out bacterial infection or co-infection with other pathogens not detected by the test. Clinical correlation with patient history and other diagnostic information is necessary to determine patient infection status. The expected result is Negative.  Fact Sheet for Patients: BloggerCourse.com  Fact Sheet for Healthcare  Providers: SeriousBroker.it  This test is not yet approved or cleared by the [place] FDA and  has been authorized for detection and/or diagnosis of SARS-CoV-2 by FDA under an Emergency Use Authorization (EUA).  This EUA will remain in effect (meaning thi s test can be used) for the duration of  the COVID-19 declaration under Section 564(b)(1) of the Act, 21 U.S.C. section 360bbb-3(b)(1), unless the authorization is terminated or revoked sooner.     Influenza A by PCR NEGATIVE NEGATIVE Final   Influenza B by PCR NEGATIVE NEGATIVE Final    Comment: (NOTE) The Xpert Xpress SARS-CoV-2/FLU/RSV plus assay is intended as an aid in the diagnosis of influenza from Nasopharyngeal swab specimens and should not be used as a sole basis for treatment. Nasal washings and aspirates are unacceptable for Xpert Xpress SARS-CoV-2/FLU/RSV testing.  Fact Sheet for Patients: BloggerCourse.com  Fact Sheet for Healthcare Providers: SeriousBroker.it  This test is not yet approved or cleared by the [place] FDA and has been authorized for detection and/or diagnosis of SARS-CoV-2 by FDA under an Emergency Use Authorization (EUA). This EUA will remain in effect (meaning this test can be used) for the duration of the COVID-19 declaration under Section 564(b)(1) of the Act, 21 U.S.C. section 360bbb-3(b)(1), unless the authorization is terminated or revoked.  Performed at [facility], [address], [place], [place] [address]   Blood Culture (routine x 2)     Status: None (Preliminary result)   Collection Time: 02/22/21  6:39 PM   Specimen: BLOOD  Result Value Ref Range Status   Specimen Description   Final    BLOOD LEFT ANTECUBITAL Performed at [facility], [address] [place], [place] [address]    Special Requests   Final    BOTTLES DRAWN AEROBIC AND ANAEROBIC Blood  Culture adequate volume Performed at [facility], [address] [place], [place] [address]    Culture   Final    NO GROWTH 2 DAYS Performed at [facility], [address] [place], [place] [address]    Report Status PENDING  Incomplete  Blood Culture (routine x 2)     Status: None (Preliminary result)   Collection Time: 02/22/21  9:56 PM   Specimen: BLOOD  Result Value Ref Range Status   Specimen Description   Final    BLOOD LEFT ANTECUBITAL Performed at [facility], [address], [place], [place] [address]    Special Requests   Final    BOTTLES DRAWN AEROBIC AND ANAEROBIC Blood Culture adequate volume Performed at [facility], [address], [place], [place] [address]  Culture   Final    NO GROWTH 2 DAYS Performed at [facility], [address], [place], [place] [address]    Report Status PENDING  Incomplete    Radiology Studies: CT Head Wo Contrast  Result Date: 02/22/2021 CLINICAL DATA:  Fall. EXAM: CT HEAD WITHOUT CONTRAST CT CERVICAL SPINE WITHOUT CONTRAST TECHNIQUE: Multidetector CT imaging of the head and cervical spine was performed following the standard protocol without intravenous contrast. Multiplanar CT image reconstructions of the cervical spine were also generated. COMPARISON:  CT head and cervical spine 09/18/2019. FINDINGS: CT HEAD FINDINGS Brain: No evidence of acute infarction, hemorrhage, hydrocephalus, extra-axial collection or mass lesion/mass effect. There is mild diffuse atrophy and mild periventricular white matter hypodensity which likely represents chronic small vessel ischemic change. Old right thalamic infarct is unchanged. Vascular: Atherosclerotic calcifications are present within the cavernous internal carotid arteries. Skull: Normal. Negative for fracture or focal lesion. Sinuses/Orbits: No acute finding. Other: None. CT CERVICAL SPINE FINDINGS Alignment: Normal. Skull base  and vertebrae: No acute fracture. No primary bone lesion or focal pathologic process. Soft tissues and spinal canal: No prevertebral fluid or swelling. No visible canal hematoma. Disc levels: There are mild degenerative endplate changes at C6-C7 similar to the prior study. No significant central canal or neural foraminal stenosis at any level. Upper chest: Negative. Other: None. IMPRESSION: No acute intracranial process. No acute fracture or traumatic subluxation of the cervical spine. Electronically Signed   By: Darliss Cheney M.D.   On: 02/22/2021 15:10   CT Cervical Spine Wo Contrast  Result Date: 02/22/2021 CLINICAL DATA:  Fall. EXAM: CT HEAD WITHOUT CONTRAST CT CERVICAL SPINE WITHOUT CONTRAST TECHNIQUE: Multidetector CT imaging of the head and cervical spine was performed following the standard protocol without intravenous contrast. Multiplanar CT image reconstructions of the cervical spine were also generated. COMPARISON:  CT head and cervical spine 09/18/2019. FINDINGS: CT HEAD FINDINGS Brain: No evidence of acute infarction, hemorrhage, hydrocephalus, extra-axial collection or mass lesion/mass effect. There is mild diffuse atrophy and mild periventricular white matter hypodensity which likely represents chronic small vessel ischemic change. Old right thalamic infarct is unchanged. Vascular: Atherosclerotic calcifications are present within the cavernous internal carotid arteries. Skull: Normal. Negative for fracture or focal lesion. Sinuses/Orbits: No acute finding. Other: None. CT CERVICAL SPINE FINDINGS Alignment: Normal. Skull base and vertebrae: No acute fracture. No primary bone lesion or focal pathologic process. Soft tissues and spinal canal: No prevertebral fluid or swelling. No visible canal hematoma. Disc levels: There are mild degenerative endplate changes at C6-C7 similar to the prior study. No significant central canal or neural foraminal stenosis at any level. Upper chest: Negative. Other:  None. IMPRESSION: No acute intracranial process. No acute fracture or traumatic subluxation of the cervical spine. Electronically Signed   By: Darliss Cheney M.D.   On: 02/22/2021 15:10   DG Pelvis Portable  Result Date: 02/22/2021 CLINICAL DATA:  Larey Seat. EXAM: PORTABLE PELVIS 1-2 VIEWS COMPARISON:  None. FINDINGS: Both hips are normally located. No acute hip fracture or AVN. The pubic symphysis and SI joints are intact. No pelvic fractures or bone lesions. IMPRESSION: No acute bony findings. Electronically Signed   By: Rudie Meyer M.D.   On: 02/22/2021 14:28   DG Chest Port 1 View  Result Date: 02/22/2021 CLINICAL DATA:  Cough EXAM: PORTABLE CHEST 1 VIEW COMPARISON:  12/23/2020 FINDINGS: The heart size and mediastinal contours are within normal limits. Both lungs are clear. The visualized skeletal structures are unremarkable. IMPRESSION: No  acute abnormality of the lungs in AP portable projection. Electronically Signed   By: Jearld Lesch M.D.   On: 02/22/2021 14:24     Scheduled Meds:  aspirin  81 mg Oral Daily   atorvastatin  20 mg Oral Daily   carbidopa-levodopa  1 tablet Oral TID   divalproex  1,000 mg Oral QHS   enoxaparin (LOVENOX) injection  40 mg Subcutaneous Q24H   levETIRAcetam  500 mg Oral BID   losartan  50 mg Oral Daily   metoprolol succinate  25 mg Oral Daily   phosphorus  250 mg Oral TID   Continuous Infusions:  remdesivir 100 mg in NS 100 mL 100 mg (02/24/21 1004)     LOS: 1 day    Time spent: 25 mins    Dirck Butch, MD [facility]   If 7PM-7AM, please contact night-coverage

## 2021-02-24 NOTE — Telephone Encounter (Signed)
Per previously documented phone call from 02/04/21: The patient's daughter was going to speak to his PCP about all medication management to reduce the number of doctors he is seeing for his care. He does not have a follow up here and has not been seen in over one year now.   I returned the call to [facility] and spoke to [place] who will relay the message to [facility] [place]. They will reach out to the patient's daughter.

## 2021-02-24 NOTE — Telephone Encounter (Signed)
[facility]) request 3 refills for risperiDONE (RISPERDAL) 1 MG tablet ,divalproex (DEPAKOTE ER) 500 MG 24 hr tablet and carbidopa-levodopa (SINEMET IR) 25-100 MG tablet at [facility]

## 2021-02-25 DIAGNOSIS — G9340 Encephalopathy, unspecified: Secondary | ICD-10-CM | POA: Diagnosis not present

## 2021-02-25 NOTE — Progress Notes (Signed)
PROGRESS NOTE    Tyrone Noble  IOM:[id number] DOB: 06-20-1950 DOA: 02/22/2021 PCP: [facility]    Brief Narrative: This 70 years old male with PMH significant for Parkinson's disease, dementia, seizure disorder, CAD, history of CVA presented in the ED for the evaluation of lethargy, cough, not eating or drinking and positive home COVID test.  Patient is accompanied by his daughter who assisted  with the history.  Patient was exposed to COVID-19 recently at daycare , remaining in his usual state until yesterday when he developed  cough and since then he became progressively lethargic and fell at home.  He has decreased p.o. intake for last 2 days despite encouragement of the family.  He normally ambulates independently and was very lethargic since yesterday and fell while trying to get to the bathroom.  Chest x-ray negative for acute abnormality.  CT Head, CT cervical spine no acute findings. Patient is admitted for acute encephalopathy possibly in the setting of acute COVID infection.  Patient has significantly improved now back to his baseline mental status.  Patient is still appears very weak and deconditioned.  She is getting remdesivir for COVID infecton.   Assessment & Plan:   Principal Problem:   Acute encephalopathy Active Problems:   Dementia without behavioral disturbance (HCC)   Parkinsonism (HCC)   COVID-19 virus infection   Essential hypertension   Seizure disorder (HCC)   Acute encephalopathy: > Improved. Presented with lethargy and not eating or drinking since 12/5 in the setting of acute COVID infection and underlying dementia. CT head no acute abnormal findings.  No significant electrolyte abnormalities noted. Continue delirium precautions and supportive care with IV hydration until he is able to tolerate adequate p.o. intake. Further work-up if he fails to improve in the next 48 hours. Patient seems alert and back to his baseline mental  status. Acute encephalopathy has resolved.  COVID-19 infection: Presented with cough, progressive lethargy since 12/5 and found to have COVID. Respiratory rate and oxygenation appears normal.  Chest x-ray unremarkable. Continue remdesivir and general supportive care.  Last dose of remdesivir 12/10. Continue droplet and airborne precautions.  Parkansas disease: Continue Sinemet.  Epilepsy: Continue Keppra and Depakote.  Dementia: Risperdal is held initially in the light of somnolence. Resume if patient is more alert.  Hypertension: Continue losartan and Toprol.  CAD: Continue cardioprotective medications( aspirin Lipitor and Toprol) He denies any chest pain, SOB, dizziness, palpitations.  History of CVA: No acute findings on CT head, Continue aspirin and Lipitor  DVT prophylaxis: Lovenox Code Status: Full code Family Communication: Son-in-law in the room. Disposition Plan:   Status is: Inpatient  Remains inpatient appropriate because:  Admitted for altered mentation probably secondary to the COVID infection.  PT and OT recommended home with home health services.  Anticipated discharge home 12/10.    Consultants:  None  Procedures: CT Head,  C-spine. Antimicrobials:   Anti-infectives (From admission, onward)    Start     Dose/Rate Route Frequency Ordered Stop   02/23/21 1630  sodium chloride 0.9 % with remdesivir ADS Med  Status:  Discontinued       Note to Pharmacy: Macie Burows: cabinet override      02/23/21 1630 02/23/21 1725   02/23/21 1000  remdesivir 100 mg in sodium chloride 0.9 % 100 mL IVPB       See Hyperspace for full Linked Orders Report.   100 mg 200 mL/hr over 30 Minutes Intravenous Daily 02/22/21 2014 02/27/21 0959  02/22/21 2015  remdesivir 200 mg in sodium chloride 0.9% 250 mL IVPB       See Hyperspace for full Linked Orders Report.   200 mg 580 mL/hr over 30 Minutes Intravenous Once 02/22/21 2014 02/23/21 0033         Subjective: Patient was seen and examined at bedside. Overnight events noted.   Patient is alert and oriented and back to his baseline mental status. Patient was seen sitting in the chair,  having breakfast, reports still feeling very weak.   Objective: Vitals:   02/24/21 1027 02/24/21 1350 02/24/21 1954 02/25/21 0546  BP: (!) 143/82 130/80 130/68 129/79  Pulse: 73 78 78 69  Resp: 16 16  14   Temp: 98.2 F (36.8 C) 98.1 F (36.7 C) 98.6 F (37 C) 98.2 F (36.8 C)  TempSrc:  Oral Oral Oral  SpO2: 100% 97% 98% 100%  Weight:      Height:        Intake/Output Summary (Last 24 hours) at 02/25/2021 1214 Last data filed at 02/25/2021 0850 Gross per 24 hour  Intake 840 ml  Output 700 ml  Net 140 ml   Filed Weights   02/23/21 2254  Weight: 70.9 kg    Examination:  General exam: Appears chronically ill looking, deconditioned, not in any distress. Respiratory system: Clear to auscultation bilaterally. Respiratory effort normal. RR15 Cardiovascular system: S1-S2 heard, regular rate and rhythm, no murmur. Gastrointestinal system: Abdomen is soft, nontender, nondistended, BS+ Central nervous system: Alert and oriented x1 . No focal neurological deficits. Extremities: No edema, no cyanosis, no clubbing Skin: No rashes, lesions or ulcers Psychiatry:Mood & affect appropriate.     Data Reviewed: I have personally reviewed following labs and imaging studies  CBC: Recent Labs  Lab 02/22/21 1358 02/23/21 0033 02/24/21 0349  WBC 6.9 6.2 5.3  NEUTROABS 4.2  --   --   HGB 14.1 13.0 12.6*  HCT 42.1 38.9* 37.5*  MCV 89.2 89.0 87.6  PLT 211 196 175   Basic Metabolic Panel: Recent Labs  Lab 02/22/21 1358 02/23/21 0033 02/24/21 0349  NA 137 135 137  K 4.3 4.1 3.4*  CL 101 106 104  CO2 27 22 26   GLUCOSE 117* 121* 86  BUN 14 15 14   CREATININE 1.09 1.03 0.94  CALCIUM 9.0 8.0* 8.2*  MG  --  2.0 2.2  PHOS  --  2.0* 3.3   GFR: Estimated Creatinine Clearance: 65 mL/min  (by C-G formula based on SCr of 0.94 mg/dL). Liver Function Tests: Recent Labs  Lab 02/22/21 1358  AST 28  ALT 11  ALKPHOS 55  BILITOT 0.7  PROT 7.5  ALBUMIN 3.8   No results for input(s): LIPASE, AMYLASE in the last 168 hours. Recent Labs  Lab 02/23/21 0033  AMMONIA 38*   Coagulation Profile: No results for input(s): INR, PROTIME in the last 168 hours. Cardiac Enzymes: No results for input(s): CKTOTAL, CKMB, CKMBINDEX, TROPONINI in the last 168 hours. BNP (last 3 results) No results for input(s): PROBNP in the last 8760 hours. HbA1C: No results for input(s): HGBA1C in the last 72 hours. CBG: No results for input(s): GLUCAP in the last 168 hours. Lipid Profile: Recent Labs    02/22/21 1839  TRIG 45   Thyroid Function Tests: Recent Labs    02/22/21 2100  TSH 1.682   Anemia Panel: Recent Labs    02/22/21 1839  FERRITIN 182   Sepsis Labs: Recent Labs  Lab 02/22/21 1839 02/22/21 2100  PROCALCITON <  0.10  --   LATICACIDVEN  --  1.6    Recent Results (from the past 240 hour(s))  Resp Panel by RT-PCR (Flu A&B, Covid) Nasopharyngeal Swab     Status: Abnormal   Collection Time: 02/22/21  4:00 PM   Specimen: Nasopharyngeal Swab; Nasopharyngeal(NP) swabs in vial transport medium  Result Value Ref Range Status   SARS Coronavirus 2 by RT PCR POSITIVE (A) NEGATIVE Final    Comment: RESULT CALLED TO, READ BACK BY AND VERIFIED WITH: ISAAC, RN @ 1714 ON 02/22/2021 BY LBROOKS, MLT (NOTE) SARS-CoV-2 target nucleic acids are DETECTED.  The SARS-CoV-2 RNA is generally detectable in upper respiratory specimens during the acute phase of infection. Positive results are indicative of the presence of the identified virus, but do not rule out bacterial infection or co-infection with other pathogens not detected by the test. Clinical correlation with patient history and other diagnostic information is necessary to determine patient infection status. The expected result is  Negative.  Fact Sheet for Patients: BloggerCourse.com  Fact Sheet for Healthcare Providers: SeriousBroker.it  This test is not yet approved or cleared by the [place] FDA and  has been authorized for detection and/or diagnosis of SARS-CoV-2 by FDA under an Emergency Use Authorization (EUA).  This EUA will remain in effect (meaning thi s test can be used) for the duration of  the COVID-19 declaration under Section 564(b)(1) of the Act, 21 U.S.C. section 360bbb-3(b)(1), unless the authorization is terminated or revoked sooner.     Influenza A by PCR NEGATIVE NEGATIVE Final   Influenza B by PCR NEGATIVE NEGATIVE Final    Comment: (NOTE) The Xpert Xpress SARS-CoV-2/FLU/RSV plus assay is intended as an aid in the diagnosis of influenza from Nasopharyngeal swab specimens and should not be used as a sole basis for treatment. Nasal washings and aspirates are unacceptable for Xpert Xpress SARS-CoV-2/FLU/RSV testing.  Fact Sheet for Patients: BloggerCourse.com  Fact Sheet for Healthcare Providers: SeriousBroker.it  This test is not yet approved or cleared by the [place] FDA and has been authorized for detection and/or diagnosis of SARS-CoV-2 by FDA under an Emergency Use Authorization (EUA). This EUA will remain in effect (meaning this test can be used) for the duration of the COVID-19 declaration under Section 564(b)(1) of the Act, 21 U.S.C. section 360bbb-3(b)(1), unless the authorization is terminated or revoked.  Performed at [facility], [address], [place], [place] [address]   Blood Culture (routine x 2)     Status: None (Preliminary result)   Collection Time: 02/22/21  6:39 PM   Specimen: BLOOD  Result Value Ref Range Status   Specimen Description   Final    BLOOD LEFT ANTECUBITAL Performed at [facility], [address], [place], [place] [address]    Special Requests   Final    BOTTLES DRAWN AEROBIC AND ANAEROBIC Blood Culture adequate volume Performed at [facility], [address] [place], [place] [address]    Culture   Final    NO GROWTH 3 DAYS Performed at [facility], [address], [place], [place] [address]    Report Status PENDING  Incomplete  Blood Culture (routine x 2)     Status: None (Preliminary result)   Collection Time: 02/22/21  9:56 PM   Specimen: BLOOD  Result Value Ref Range Status   Specimen Description   Final    BLOOD LEFT ANTECUBITAL Performed at [facility], [address]  [place], [place] [address]    Special Requests   Final    BOTTLES DRAWN AEROBIC AND ANAEROBIC Blood Culture adequate volume Performed at [facility], [address] [place], [place] [address]    Culture   Final    NO GROWTH 3 DAYS Performed at [facility], [address] [place], [place] [address]    Report Status PENDING  Incomplete    Radiology Studies: No results found.   Scheduled Meds:  aspirin  81 mg Oral Daily   atorvastatin  20 mg Oral Daily   carbidopa-levodopa  1 tablet Oral TID   divalproex  1,000 mg Oral QHS   enoxaparin (LOVENOX) injection  40 mg Subcutaneous Q24H   levETIRAcetam  500 mg Oral BID   losartan  50 mg Oral Daily   metoprolol succinate  25 mg Oral Daily   Continuous Infusions:  remdesivir 100 mg in NS 100 mL 100 mg (02/25/21 0948)     LOS: 2 days    Time spent: 25 mins    Armonee Bojanowski, MD [facility]   If 7PM-7AM, please contact night-coverage

## 2021-02-25 NOTE — Care Management Important Message (Signed)
Important Message  Patient Details IM Letter given to the Patient. Name: Tyrone Noble MRN: [id number] Date of Birth: 01/20/1951   Medicare Important Message Given:  Yes     Caren Macadam 02/25/2021, 1:37 PM

## 2021-02-25 NOTE — Evaluation (Signed)
Physical Therapy Evaluation Patient Details Name: Tyrone Noble MRN: [id number] DOB: 25-Sep-1950 Today's Date: 02/25/2021  History of Present Illness  70 years old male with PMH significant for Parkinson's disease, dementia, seizure disorder, CAD, history of CVA presented in the ED for the evaluation of lethargy, cough, not eating or drinking and positive home COVID test. Larey Seat at home just prior to admission.  Clinical Impression  Pt admitted with above diagnosis. Pt ambulated ~90' in room, 35' with RW and 30' with hand held assist. At baseline he walks without a device and has not had falls in past 6 months, other than the fall just prior to admission. Pt was more steady with RW and PT is recommending he use it to minimize fall risk as he recovers from covid. Daughter present during PT session and is very supportive. No dyspnea nor loss of balance during PT session. He is ready to DC home with 24* assist from his family from a PT standpoint. Pt currently with functional limitations due to the deficits listed below (see PT Problem List). Pt will benefit from skilled PT to increase their independence and safety with mobility to allow discharge to the venue listed below.          Recommendations for follow up therapy are one component of a multi-disciplinary discharge planning process, led by the attending physician.  Recommendations may be updated based on patient status, additional functional criteria and insurance authorization.  Follow Up Recommendations Home health PT    Assistance Recommended at Discharge Intermittent Supervision/Assistance  Functional Status Assessment    Equipment Recommendations  Rolling walker (2 wheels)    Recommendations for Other Services       Precautions / Restrictions Precautions Precautions: Fall Precaution Comments: fell just prior to admission, daughter reports no other falls in past 6 months Restrictions Weight Bearing Restrictions: No       Mobility  Bed Mobility               General bed mobility comments: up in recliner    Transfers Overall transfer level: Needs assistance Equipment used: None Transfers: Sit to/from Stand Sit to Stand: Min guard;Supervision           General transfer comment: VCs hand placement, able to rise without assist, min/guard safety due to recent fall    Ambulation/Gait Ambulation/Gait assistance: Supervision;Min guard Gait Distance (Feet): 90 Feet Assistive device: Rolling walker (2 wheels);1 person hand held assist Gait Pattern/deviations: Step-through pattern;Decreased stride length;Trunk flexed Gait velocity: decr     General Gait Details: trial of RW in room, pt reports he feels more steady with RW than without, did ambulate short distance without RW and had no loss of balance but was reaching for objects for support. No dyspnea with activity.  Stairs            Wheelchair Mobility    Modified Rankin (Stroke Patients Only)       Balance Overall balance assessment: Needs assistance   Sitting balance-Leahy Scale: Good       Standing balance-Leahy Scale: Good Standing balance comment: tends to reach for UE support when walking without AD                             Pertinent Vitals/Pain Pain Assessment: No/denies pain    Home Living Family/patient expects to be discharged to:: Private residence Living Arrangements: Children;Other relatives Available Help at Discharge: Family;Available PRN/intermittently Type of Home: House  Home Access: Level entry       Home Layout: One level Home Equipment: None Additional Comments: Pt goes to [employer] adult daycare center Monday-Friday. Lives with daughter, son in law and teen grandchild.  Daughter reports no falls other than 1 just prior to admission.    Prior Function Prior Level of Function : Independent/Modified Independent             Mobility Comments: no assistive device        Hand Dominance   Dominant Hand: Right    Extremity/Trunk Assessment   Upper Extremity Assessment Upper Extremity Assessment: Defer to OT evaluation    Lower Extremity Assessment Lower Extremity Assessment: RLE deficits/detail;LLE deficits/detail RLE Deficits / Details: strength BLEs +4/5, pt reports decreased sensation to light touch B feet RLE Sensation: decreased light touch LLE Sensation: decreased light touch    Cervical / Trunk Assessment Cervical / Trunk Assessment: Kyphotic (forward head)  Communication   Communication: No difficulties  Cognition Arousal/Alertness: Awake/alert Behavior During Therapy: WFL for tasks assessed/performed Overall Cognitive Status: Within Functional Limits for tasks assessed                                          General Comments      Exercises     Assessment/Plan    PT Assessment Patient needs continued PT services  PT Problem List Decreased balance;Decreased knowledge of use of DME;Decreased activity tolerance       PT Treatment Interventions      PT Goals (Current goals can be found in the Care Plan section)  Acute Rehab PT Goals Patient Stated Goal: to get strength back PT Goal Formulation: With patient/family Time For Goal Achievement: 03/11/21 Potential to Achieve Goals: Good    Frequency Min 3X/week   Barriers to discharge        Co-evaluation               AM-PAC PT "6 Clicks" Mobility  Outcome Measure Help needed turning from your back to your side while in a flat bed without using bedrails?: None Help needed moving from lying on your back to sitting on the side of a flat bed without using bedrails?: None Help needed moving to and from a bed to a chair (including a wheelchair)?: A Little Help needed standing up from a chair using your arms (e.g., wheelchair or bedside chair)?: A Little Help needed to walk in hospital room?: A Little Help needed climbing 3-5 steps with a railing? :  A Little 6 Click Score: 20    End of Session Equipment Utilized During Treatment: Gait belt Activity Tolerance: Patient tolerated treatment well Patient left: in chair;with call bell/phone within reach;with family/visitor present;with chair alarm set Nurse Communication: Mobility status PT Visit Diagnosis: Difficulty in walking, not elsewhere classified (R26.2);Unsteadiness on feet (R26.81);History of falling (Z91.81)    Time: 7939-0300 PT Time Calculation (min) (ACUTE ONLY): 25 min   Charges:   PT Evaluation $PT Eval Moderate Complexity: 1 Mod PT Treatments $Gait Training: 8-22 mins      Ralene Bathe Kistler PT 02/25/2021  Acute Rehabilitation Services Pager [phone] Office [phone]

## 2021-02-25 NOTE — TOC Initial Note (Signed)
Transition of Care [facility]) - Initial/Assessment Note   Patient Details  Name: Tyrone Noble MRN: [id number] Date of Birth: 08/16/1950  Transition of Care [facility] CM/SW Contact:    Ewing Schlein, LCSW Phone Number: 02/25/2021, 1:20 PM  Clinical Narrative: PT and OT evaluations recommended HH. CSW spoke with daughter, Tyrone Noble, regarding recommendations. Daughter requested [facility] as the patient has used the agency before and they were happy with the services provided. PT also recommended a rolling walker and daughter is agreeable to DME referral.  CSW made referral to Cindie with [facility] for PT and OT, which was accepted. CSW made DME referral to [facility] with [employer]. [employer] to deliver walker to patient's room. CSW updated daughter.  Expected Discharge Plan: Home w Home Health Services Barriers to Discharge: Continued Medical Work up  Patient Goals and CMS Choice Patient states their goals for this hospitalization and ongoing recovery are:: Go home with [facility] through [facility].gov Compare Post Acute Care list provided to:: Patient Represenative (must comment) Tyrone Noble (daughter)) Choice offered to / list presented to : Adult Children  Expected Discharge Plan and Services Expected Discharge Plan: Home w Home Health Services In-house Referral: Clinical Social Work Post Acute Care Choice: Home Health Living arrangements for the past 2 months: Single Family Home           DME Arranged: Walker rolling DME Agency: [employer] Date DME Agency Contacted: 02/25/21 Time DME Agency Contacted: 1315 Representative spoke with at DME Agency: Barbette Or HH Arranged: PT, OT HH Agency: [facility] Date [facility] Agency Contacted: 02/25/21 Time HH Agency Contacted: 1140 Representative spoke with at [facility] Agency: Cindie  Prior Living Arrangements/Services Living arrangements for the past 2 months: Single Family Home Patient language and need for interpreter reviewed:: Yes Do you  feel safe going back to the place where you live?: Yes      Need for Family Participation in Patient Care: Yes (Comment) (Patient has dementia and is oriented x2 at baseline.) Care giver support system in place?: Yes (comment) Criminal Activity/Legal Involvement Pertinent to Current Situation/Hospitalization: No - Comment as needed  Activities of Daily Living Home Assistive Devices/Equipment: Dentures (specify type) (full set dentures) ADL Screening (condition at time of admission) Patient's cognitive ability adequate to safely complete daily activities?: No Is the patient deaf or have difficulty hearing?: No Does the patient have difficulty seeing, even when wearing glasses/contacts?: No Does the patient have difficulty concentrating, remembering, or making decisions?: Yes Patient able to express need for assistance with ADLs?: No Does the patient have difficulty dressing or bathing?: Yes Independently performs ADLs?: No Communication: Independent Dressing (OT): Needs assistance Is this a change from baseline?: Pre-admission baseline Grooming: Needs assistance Is this a change from baseline?: Pre-admission baseline Feeding: Independent Bathing: Needs assistance Is this a change from baseline?: Pre-admission baseline Toileting: Needs assistance Is this a change from baseline?: Pre-admission baseline In/Out Bed: Needs assistance Is this a change from baseline?: Pre-admission baseline Walks in Home: Dependent Is this a change from baseline?: Change from baseline, expected to last >3 days Does the patient have difficulty walking or climbing stairs?: Yes Weakness of Legs: Both Weakness of Arms/Hands: Both  Permission Sought/Granted Permission sought to share information with : Other (comment) Permission granted to share information with : Yes, Verbal Permission Granted Permission granted to share info w AGENCY: [facility], DME agency  Emotional Assessment Attitude/Demeanor/Rapport:  Unable to Assess Affect (typically observed): Unable to Assess Orientation: : Oriented to Place, Oriented to Self  Alcohol / Substance Use: Not Applicable Psych Involvement: No (comment)  Admission diagnosis:  Failure to thrive in adult [R62.7] Acute encephalopathy [G93.40] Acute metabolic encephalopathy [G93.41] COVID-19 [U07.1] Patient Active Problem List   Diagnosis Date Noted   Acute encephalopathy 02/22/2021   Failure to thrive in adult    COVID-19 virus infection 03/19/2020   Generalized weakness 03/19/2020   Essential hypertension 03/19/2020   Seizure disorder (HCC) 03/19/2020   Parkinsonism (HCC) 02/26/2018   Therapeutic drug monitoring 09/12/2017   Seizures (HCC) 05/31/2017   Dementia without behavioral disturbance (HCC) 05/31/2017   New-onset angina (HCC) 02/16/2015   HYPERLIPIDEMIA 12/13/2009   ALCOHOLISM 07/07/2009   CALLUS, TOE 01/03/2007   DEPRESSION, MAJOR, RECURRENT 05/17/2006   TOBACCO DEPENDENCE 05/17/2006   HYPERTENSION, BENIGN SYSTEMIC 05/17/2006   HEMORRHOIDS, NOS 05/17/2006   GASTROESOPHAGEAL REFLUX, NO ESOPHAGITIS 05/17/2006   LEG PAIN OR KNEE PAIN 05/17/2006   PCP:  [facility] Pharmacy:   [id number] [place], [place] - [address] AT [facility] [address] [place] [address] Phone: [phone] Fax: [phone]  [place] [facility] - [facility] [place], [place] [address] Laure[address] Josephs [address] [place] [address] Phone: [phone] Fax: [phone]  [id number] [address] [place] - [address] [facility] [address] [place] [address] Phone: [phone] Fax: [phone]  Readmission Risk Interventions No flowsheet data found.

## 2021-02-25 NOTE — Evaluation (Signed)
Occupational Therapy Evaluation Patient Details Name: Tyrone Noble MRN: [id number] DOB: 02/18/1951 Today's Date: 02/25/2021   History of Present Illness 70 years old male with PMH significant for Parkinson's disease, dementia, seizure disorder, CAD, history of CVA presented in the ED for the evaluation of lethargy, cough, not eating or drinking and positive home COVID test. Larey Seat at home just prior to admission.   Clinical Impression   [name] is a 70 year old man who presents with above medical history. On evaluation he demonstrated impaired balance requiring min guard to min assist for all activities. He also required verbal cues for ADLs for sequencing. Patient able to ambulate in room with device but unsteady due to narrow base of support. Recommend HH OT services to maximize patient's functional abilities as well compensatory strategy education for family. No further acute OT services required.     Recommendations for follow up therapy are one component of a multi-disciplinary discharge planning process, led by the attending physician.  Recommendations may be updated based on patient status, additional functional criteria and insurance authorization.   Follow Up Recommendations  Home health OT    Assistance Recommended at Discharge Frequent or constant Supervision/Assistance  Functional Status Assessment  Patient has had a recent decline in their functional status and demonstrates the ability to make significant improvements in function in a reasonable and predictable amount of time.  Equipment Recommendations  None recommended by OT    Recommendations for Other Services       Precautions / Restrictions Precautions Precautions: Fall Precaution Comments: fell just prior to admission, daughter reports no other falls in past 6 months Restrictions Weight Bearing Restrictions: No      Mobility Bed Mobility               General bed mobility comments: up in  recliner    Transfers Overall transfer level: Needs assistance Equipment used: None Transfers: Sit to/from Stand Sit to Stand: Min guard;Supervision           General transfer comment: VCs hand placement, able to rise without assist, min/guard safety due to recent fall      Balance Overall balance assessment: Mild deficits observed, not formally tested   Sitting balance-Leahy Scale: Good       Standing balance-Leahy Scale: Good Standing balance comment: tends to reach for UE support when walking without AD                           ADL either performed or assessed with clinical judgement   ADL Overall ADL's : Needs assistance/impaired Eating/Feeding: Set up;Sitting   Grooming: Min guard;Cueing for sequencing;Standing;Oral care;Set up Grooming Details (indicate cue type and reason): Patietn stood at sink to brush teeth. He required setup, verbal cues to perform task quality and min guard for standing due to impaired balance Upper Body Bathing: Set up;Cueing for sequencing;Sitting   Lower Body Bathing: Minimal assistance;Sit to/from stand;Cueing for sequencing   Upper Body Dressing : Set up;Sitting;Cueing for sequencing   Lower Body Dressing: Cueing for sequencing;Minimal assistance Lower Body Dressing Details (indicate cue type and reason): able to don socks in seated position but min assist for clothing management with standing due to impaired balance Toilet Transfer: Min guard;Regular Toilet;Grab bars   Toileting- Clothing Manipulation and Hygiene: Minimal assistance;Sit to/from stand   Tub/ Shower Transfer: Min guard   Functional mobility during ADLs: Min guard       Vision  Vision Assessment?: No apparent visual deficits     Perception     Praxis      Pertinent Vitals/Pain Pain Assessment: No/denies pain     Hand Dominance Right   Extremity/Trunk Assessment Upper Extremity Assessment Upper Extremity Assessment: Overall WFL for tasks  assessed;RUE deficits/detail;LUE deficits/detail RUE Deficits / Details: WFL ROM, 5/5 strength - tremors noted RUE Coordination: decreased fine motor LUE Deficits / Details: WFL ROm, 5/5 strength - tremors noted LUE Coordination: decreased fine motor   Lower Extremity Assessment Lower Extremity Assessment: Defer to PT evaluation RLE Deficits / Details: strength BLEs +4/5, pt reports decreased sensation to light touch B feet RLE Sensation: decreased light touch LLE Sensation: decreased light touch   Cervical / Trunk Assessment Cervical / Trunk Assessment: Kyphotic   Communication Communication Communication: No difficulties   Cognition Arousal/Alertness: Awake/alert Behavior During Therapy: WFL for tasks assessed/performed Overall Cognitive Status: History of cognitive impairments - at baseline                                       General Comments       Exercises     Shoulder Instructions      Home Living Family/patient expects to be discharged to:: Private residence Living Arrangements: Children;Other relatives Available Help at Discharge: Family;Available PRN/intermittently Type of Home: House Home Access: Level entry     Home Layout: One level     Bathroom Shower/Tub: Tub/shower unit         Home Equipment: None   Additional Comments: Pt goes to [employer] [facility] day[facility] Monday-Friday. Lives with daughter, son in law and teen grandchild.  Daughter reports no falls other than 1 just prior to admission.      Prior Functioning/Environment Prior Level of Function : Independent/Modified Independent             Mobility Comments: no assistive device          OT Problem List: Impaired balance (sitting and/or standing);Decreased cognition;Decreased coordination;Decreased knowledge of use of DME or AE      OT Treatment/Interventions:      OT Goals(Current goals can be found in the care plan section) Acute Rehab OT Goals OT Goal  Formulation: All assessment and education complete, DC therapy  OT Frequency:     Barriers to D/C:            Co-evaluation              AM-PAC OT "6 Clicks" Daily Activity     Outcome Measure Help from another person eating meals?: A Little Help from another person taking care of personal grooming?: A Little Help from another person toileting, which includes using toliet, bedpan, or urinal?: A Little Help from another person bathing (including washing, rinsing, drying)?: A Little Help from another person to put on and taking off regular upper body clothing?: A Little Help from another person to put on and taking off regular lower body clothing?: A Little 6 Click Score: 18   End of Session Equipment Utilized During Treatment: Gait belt Nurse Communication: Mobility status  Activity Tolerance: Patient tolerated treatment well Patient left: in chair;with call bell/phone within reach;with chair alarm set  OT Visit Diagnosis: Unsteadiness on feet (R26.81)                Time: 5638-7564 OT Time Calculation (min): 13 min Charges:  OT General Charges $OT  Visit: 1 Visit OT Evaluation $OT Eval Low Complexity: 1 Low  Janeliz Prestwood, OTR/L Acute Care Rehab Services  Office [phone] Pager: [phone]   Kelli Churn 02/25/2021, 1:18 PM

## 2021-02-26 DIAGNOSIS — G9340 Encephalopathy, unspecified: Secondary | ICD-10-CM | POA: Diagnosis not present

## 2021-02-26 LAB — BASIC METABOLIC PANEL
Anion gap: 9 (ref 5–15)
BUN: 18 mg/dL (ref 8–23)
CO2: 26 mmol/L (ref 22–32)
Calcium: 8.4 mg/dL — ABNORMAL LOW (ref 8.9–10.3)
Chloride: 102 mmol/L (ref 98–111)
Creatinine, Ser: 1.12 mg/dL (ref 0.61–1.24)
GFR, Estimated: >60 mL/min (ref >60)
Glucose, Bld: 78 mg/dL (ref 70–99)
Potassium: 4 mmol/L (ref 3.5–5.1)
Sodium: 137 mmol/L (ref 135–145)

## 2021-02-26 NOTE — Discharge Summary (Signed)
Physician Discharge Summary  Tyrone Noble MOQ:[id number] DOB: [birth year] DOA: 02/22/2021  PCP: [facility]  Admit date: 02/22/2021  Discharge date: 02/26/2021  Admitted From: Home.  Disposition:  Home health Services.  Recommendations for Outpatient Follow-up:  Follow up with PCP in 1-2 weeks Please obtain BMP/CBC in one week Patient has completed remedesivir therapy for COVID infection.  Home Health: Home PT/OT Equipment/Devices: Rolling walker.  Discharge Condition: Stable CODE STATUS: Full code Diet recommendation: Heart Healthy  Brief Summary / Hospital Course: This 70 years old male with PMH significant for Parkinson's disease, dementia, seizure disorder, CAD, history of CVA presented in the ED for the evaluation of lethargy, cough, not eating or drinking and positive home COVID test.  Patient is accompanied by his daughter who assisted  with the history.  Patient was exposed to COVID-19 recently at [facility]  daycare , remaining in his usual state until yesterday when he developed  cough and since then he became progressively lethargic and fell at home.  He has decreased p.o. intake for last 2 days despite encouragement of the family.  He normally ambulates independently and was very lethargic since yesterday and fell while trying to get to the bathroom.  Chest x-ray negative for acute abnormality.  CT Head, CT cervical spine no acute findings. Patient was admitted for acute encephalopathy possibly in the setting of acute COVID infection.  Patient was started on remdesivir and continued on IV hydration and supportive care.  Patient was not hypoxic and not requiring oxygen.  Patient has significantly improved,  now back to his baseline mental status.  Patient has completed remdesivir therapy for 4 days.  Patient has participated in physical therapy,  recommended home PT and OT.  Rolling walker was arranged.  Patient feels better and wants to be discharged.   Patient is being discharged home.  He was managed for below problems.   Discharge Diagnoses:  Principal Problem:   Acute encephalopathy Active Problems:   Dementia without behavioral disturbance (HCC)   Parkinsonism (HCC)   COVID-19 virus infection   Essential hypertension   Seizure disorder (HCC)  Acute encephalopathy: > Improved. Presented with lethargy and not eating or drinking since 12/5 in the setting of acute COVID infection and underlying dementia. CT head no acute abnormal findings.  No significant electrolyte abnormalities noted. Continue delirium precautions and supportive care with IV hydration until he is able to tolerate adequate p.o. intake. Further work-up if he fails to improve in the next 48 hours. Patient seems alert and back to his baseline mental status. Acute encephalopathy has resolved.   COVID-19 infection: Presented with cough, progressive lethargy since 12/5 and found to have COVID. Respiratory rate and oxygenation appears normal.  Chest x-ray unremarkable. Continue remdesivir and general supportive care.  Last dose of remdesivir 12/10. Continue droplet and airborne precautions.   Park[place] disease: Continue Sinemet.   Epilepsy: Continue Keppra and Depakote.   Dementia: Risperdal is held initially in the light of somnolence. Resume if patient is more alert.   Hypertension: Continue losartan and Toprol.   CAD: Continue cardioprotective medications( aspirin Lipitor and Toprol) He denies any chest pain, SOB, dizziness, palpitations.   History of CVA: No acute findings on CT head, Continue aspirin and Lipitor    Discharge Instructions  Discharge Instructions     Call MD for:  difficulty breathing, headache or visual disturbances   Complete by: As directed    Call MD for:  persistant dizziness or light-headedness  Complete by: As directed    Call MD for:  persistant nausea and vomiting   Complete by: As directed    Diet - low sodium  heart healthy   Complete by: As directed    Diet - low sodium heart healthy   Complete by: As directed    Diet Carb Modified   Complete by: As directed    Discharge instructions   Complete by: As directed    Advised to follow up PCP in one week. Patient has completed remedesivir therapy for COVID infection. .   Increase activity slowly   Complete by: As directed    Increase activity slowly   Complete by: As directed       Allergies as of 02/26/2021       Reactions   Aricept [donepezil Hcl] Nausea Only   Penicillins Nausea And Vomiting   Has patient had a PCN reaction causing immediate rash, facial/tongue/throat swelling, SOB or lightheadedness with hypotension: No  Has patient had a PCN reaction causing severe rash involving mucus membranes or skin necrosis: No  Has patient had a PCN reaction that required hospitalization: No  Has patient had a PCN reaction occurring within the last 10 years: No  If all of the above answers are "NO", then may proceed with Cephalosporin use.        Medication List     STOP taking these medications    guaiFENesin 600 MG 12 hr tablet Commonly known as: MUCINEX       TAKE these medications    aspirin 81 MG chewable tablet Chew 1 tablet (81 mg total) by mouth daily.   atorvastatin 20 MG tablet Commonly known as: LIPITOR Take 20 mg by mouth daily.   carbidopa-levodopa 25-250 MG tablet Commonly known as: SINEMET IR Take 1 tablet by mouth 3 (three) times daily. What changed: Another medication with the same name was removed. Continue taking this medication, and follow the directions you see here.   divalproex 500 MG 24 hr tablet Commonly known as: Depakote ER Take 2 tablets (1,000 mg total) by mouth at bedtime.   levETIRAcetam 500 MG tablet Commonly known as: Keppra Take 2 tablets (1,000 mg total) by mouth 2 (two) times daily. What changed: how much to take   losartan 100 MG tablet Commonly known as: COZAAR Take 50 mg by  mouth daily.   metoprolol succinate 25 MG 24 hr tablet Commonly known as: TOPROL-XL Take 25 mg by mouth daily.   multivitamin with minerals Tabs tablet Take 1 tablet by mouth daily.   risperiDONE 1 MG tablet Commonly known as: RISPERDAL Take 1 tablet (1 mg total) by mouth at bedtime.               Durable Medical Equipment  (From admission, onward)           Start     Ordered   02/25/21 1228  For home use only DME Walker  Once       Question:  Patient needs a walker to treat with the following condition  Answer:  Generalized weakness   02/25/21 1228            Follow-up Information     [facility] Follow up.   Specialty: Home Health Services Why: PT and OT Contact information: [address] [place] [address] [phone]         [facility] Follow up in 1 week(s).   Specialty: Family  Medicine Contact information: [address] [place] [address] [phone]                Allergies  Allergen Reactions   Aricept [Donepezil Hcl] Nausea Only   Penicillins Nausea And Vomiting    Has patient had a PCN reaction causing immediate rash, facial/tongue/throat swelling, SOB or lightheadedness with hypotension: No  Has patient had a PCN reaction causing severe rash involving mucus membranes or skin necrosis: No  Has patient had a PCN reaction that required hospitalization: No  Has patient had a PCN reaction occurring within the last 10 years: No  If all of the above answers are "NO", then may proceed with Cephalosporin use.    Consultations: None   Procedures/Studies: CT Head Wo Contrast  Result Date: 02/22/2021 CLINICAL DATA:  Fall. EXAM: CT HEAD WITHOUT CONTRAST CT CERVICAL SPINE WITHOUT CONTRAST TECHNIQUE: Multidetector CT imaging of the head and cervical spine was performed following the standard protocol without intravenous contrast. Multiplanar CT image  reconstructions of the cervical spine were also generated. COMPARISON:  CT head and cervical spine 09/18/2019. FINDINGS: CT HEAD FINDINGS Brain: No evidence of acute infarction, hemorrhage, hydrocephalus, extra-axial collection or mass lesion/mass effect. There is mild diffuse atrophy and mild periventricular white matter hypodensity which likely represents chronic small vessel ischemic change. Old right thalamic infarct is unchanged. Vascular: Atherosclerotic calcifications are present within the cavernous internal carotid arteries. Skull: Normal. Negative for fracture or focal lesion. Sinuses/Orbits: No acute finding. Other: None. CT CERVICAL SPINE FINDINGS Alignment: Normal. Skull base and vertebrae: No acute fracture. No primary bone lesion or focal pathologic process. Soft tissues and spinal canal: No prevertebral fluid or swelling. No visible canal hematoma. Disc levels: There are mild degenerative endplate changes at C6-C7 similar to the prior study. No significant central canal or neural foraminal stenosis at any level. Upper chest: Negative. Other: None. IMPRESSION: No acute intracranial process. No acute fracture or traumatic subluxation of the cervical spine. Electronically Signed   By: Darliss Cheney M.D.   On: 02/22/2021 15:10   CT Cervical Spine Wo Contrast  Result Date: 02/22/2021 CLINICAL DATA:  Fall. EXAM: CT HEAD WITHOUT CONTRAST CT CERVICAL SPINE WITHOUT CONTRAST TECHNIQUE: Multidetector CT imaging of the head and cervical spine was performed following the standard protocol without intravenous contrast. Multiplanar CT image reconstructions of the cervical spine were also generated. COMPARISON:  CT head and cervical spine 09/18/2019. FINDINGS: CT HEAD FINDINGS Brain: No evidence of acute infarction, hemorrhage, hydrocephalus, extra-axial collection or mass lesion/mass effect. There is mild diffuse atrophy and mild periventricular white matter hypodensity which likely represents chronic small  vessel ischemic change. Old right thalamic infarct is unchanged. Vascular: Atherosclerotic calcifications are present within the cavernous internal carotid arteries. Skull: Normal. Negative for fracture or focal lesion. Sinuses/Orbits: No acute finding. Other: None. CT CERVICAL SPINE FINDINGS Alignment: Normal. Skull base and vertebrae: No acute fracture. No primary bone lesion or focal pathologic process. Soft tissues and spinal canal: No prevertebral fluid or swelling. No visible canal hematoma. Disc levels: There are mild degenerative endplate changes at C6-C7 similar to the prior study. No significant central canal or neural foraminal stenosis at any level. Upper chest: Negative. Other: None. IMPRESSION: No acute intracranial process. No acute fracture or traumatic subluxation of the cervical spine. Electronically Signed   By: Darliss Cheney M.D.   On: 02/22/2021 15:10   DG Pelvis Portable  Result Date: 02/22/2021 CLINICAL DATA:  Larey Seat. EXAM: PORTABLE PELVIS 1-2 VIEWS COMPARISON:  None. FINDINGS: Both hips are normally located. No acute hip fracture or AVN. The pubic symphysis and SI joints are intact. No pelvic fractures or bone lesions. IMPRESSION: No acute bony findings. Electronically Signed   By: Rudie Meyer M.D.   On: 02/22/2021 14:28   DG Chest Port 1 View  Result Date: 02/22/2021 CLINICAL DATA:  Cough EXAM: PORTABLE CHEST 1 VIEW COMPARISON:  12/23/2020 FINDINGS: The heart size and mediastinal contours are within normal limits. Both lungs are clear. The visualized skeletal structures are unremarkable. IMPRESSION: No acute abnormality of the lungs in AP portable projection. Electronically Signed   By: Jearld Lesch M.D.   On: 02/22/2021 14:24     Subjective: Patient was seen and examined at bedside.  Overnight events noted.  Patient reports feeling much improved.  He participated in physical therapy.  Patient is being discharged home today,  Home and services been arranged.  Discharge  Exam: Vitals:   02/26/21 0058 02/26/21 0511  BP: 123/75 114/63  Pulse: 78 73  Resp: 18 16  Temp: 98.6 F (37 C) 99.4 F (37.4 C)  SpO2: 98% 99%   Vitals:   02/25/21 0546 02/25/21 1500 02/26/21 0058 02/26/21 0511  BP: 129/79 138/82 123/75 114/63  Pulse: 69 74 78 73  Resp: Temp: 98.2 F (36.8 C) 97.6 F (36.4 C) 98.6 F (37 C) 99.4 F (37.4 C)  TempSrc: Oral Oral Oral Axillary  SpO2: 100% 98% 98% 99%  Weight:      Height:        General: Pt is alert, awake, not in acute distress Cardiovascular: RRR, S1/S2 +, no rubs, no gallops Respiratory: CTA bilaterally, no wheezing, no rhonchi Abdominal: Soft, NT, ND, bowel sounds + Extremities: no edema, no cyanosis    The results of significant diagnostics from this hospitalization (including imaging, microbiology, ancillary and laboratory) are listed below for reference.     Microbiology: Recent Results (from the past 240 hour(s))  Resp Panel by RT-PCR (Flu A&B, Covid) Nasopharyngeal Swab     Status: Abnormal   Collection Time: 02/22/21  4:00 PM   Specimen: Nasopharyngeal Swab; Nasopharyngeal(NP) swabs in vial transport medium  Result Value Ref Range Status   SARS Coronavirus 2 by RT PCR POSITIVE (A) NEGATIVE Final    Comment: RESULT CALLED TO, READ BACK BY AND VERIFIED WITH: ISAAC, RN @ 1714 ON 02/22/2021 BY LBROOKS, MLT (NOTE) SARS-CoV-2 target nucleic acids are DETECTED.  The SARS-CoV-2 RNA is generally detectable in upper respiratory specimens during the acute phase of infection. Positive results are indicative of the presence of the identified virus, but do not rule out bacterial infection or co-infection with other pathogens not detected by the test. Clinical correlation with patient history and other diagnostic information is necessary to determine patient infection status. The expected result is Negative.  Fact Sheet for Patients: BloggerCourse.com  Fact Sheet for  Healthcare Providers: SeriousBroker.it  This test is not yet approved or cleared by the [place] FDA and  has been authorized for detection and/or diagnosis of SARS-CoV-2 by FDA under an Emergency Use Authorization (EUA).  This EUA will remain in effect (meaning thi s test can be used) for the duration of  the COVID-19 declaration under Section 564(b)(1) of the Act, 21 U.S.C. section 360bbb-3(b)(1), unless the authorization is terminated or revoked sooner.     Influenza A by PCR NEGATIVE NEGATIVE Final   Influenza B by PCR NEGATIVE NEGATIVE Final    Comment: (NOTE) The  Xpert Xpress SARS-CoV-2/FLU/RSV plus assay is intended as an aid in the diagnosis of influenza from Nasopharyngeal swab specimens and should not be used as a sole basis for treatment. Nasal washings and aspirates are unacceptable for Xpert Xpress SARS-CoV-2/FLU/RSV testing.  Fact Sheet for Patients: BloggerCourse.com  Fact Sheet for Healthcare Providers: SeriousBroker.it  This test is not yet approved or cleared by the [place] FDA and has been authorized for detection and/or diagnosis of SARS-CoV-2 by FDA under an Emergency Use Authorization (EUA). This EUA will remain in effect (meaning this test can be used) for the duration of the COVID-19 declaration under Section 564(b)(1) of the Act, 21 U.S.C. section 360bbb-3(b)(1), unless the authorization is terminated or revoked.  Performed at [facility], [address] [place], [place] [address]   Blood Culture (routine x 2)     Status: None (Preliminary result)   Collection Time: 02/22/21  6:39 PM   Specimen: BLOOD  Result Value Ref Range Status   Specimen Description   Final    BLOOD LEFT ANTECUBITAL Performed at [facility], [address] [place], [place] [address]    Special Requests   Final    BOTTLES DRAWN AEROBIC AND ANAEROBIC  Blood Culture adequate volume Performed at [facility], [address], [place], [place] [address]    Culture   Final    NO GROWTH 4 DAYS Performed at [facility], [address], [place], [place] [address]    Report Status PENDING  Incomplete  Blood Culture (routine x 2)     Status: None (Preliminary result)   Collection Time: 02/22/21  9:56 PM   Specimen: BLOOD  Result Value Ref Range Status   Specimen Description   Final    BLOOD LEFT ANTECUBITAL Performed at [facility], [address] [place], [place] [address]    Special Requests   Final    BOTTLES DRAWN AEROBIC AND ANAEROBIC Blood Culture adequate volume Performed at [facility], [address] [place], [place] [address]    Culture   Final    NO GROWTH 4 DAYS Performed at [facility], [address] [place], [place] [address]    Report Status PENDING  Incomplete     Labs: BNP (last 3 results) No results for input(s): BNP in the last 8760 hours. Basic Metabolic Panel: Recent Labs  Lab 02/22/21 1358 02/23/21 0033 02/24/21 0349 02/26/21 0403  NA 137 135 137 137  K 4.3 4.1 3.4* 4.0  CL 101 106 104 102  CO2 27 22 26 26   GLUCOSE 117* 121* 86 78  BUN 14 15 14 18   CREATININE 1.09 1.03 0.94 1.12  CALCIUM 9.0 8.0* 8.2* 8.4*  MG  --  2.0 2.2  --   PHOS  --  2.0* 3.3  --    Liver Function Tests: Recent Labs  Lab 02/22/21 1358  AST 28  ALT 11  ALKPHOS 55  BILITOT 0.7  PROT 7.5  ALBUMIN 3.8   No results for input(s): LIPASE, AMYLASE in the last 168 hours. Recent Labs  Lab 02/23/21 0033  AMMONIA 38*   CBC: Recent Labs  Lab 02/22/21 1358 02/23/21 0033 02/24/21 0349  WBC 6.9 6.2 5.3  NEUTROABS 4.2  --   --   HGB 14.1 13.0 12.6*  HCT 42.1 38.9* 37.5*  MCV 89.2 89.0 87.6  PLT 211 196 175   Cardiac Enzymes: No results for input(s): CKTOTAL, CKMB,  CKMBINDEX, TROPONINI in the last 168 hours. BNP: Invalid input(s):  POCBNP CBG: No results for input(s): GLUCAP in the last 168 hours. D-Dimer No results for input(s): DDIMER in the last 72 hours. Hgb A1c No results for input(s): HGBA1C in the last 72 hours. Lipid Profile No results for input(s): CHOL, HDL, LDLCALC, TRIG, CHOLHDL, LDLDIRECT in the last 72 hours. Thyroid function studies No results for input(s): TSH, T4TOTAL, T3FREE, THYROIDAB in the last 72 hours.  Invalid input(s): FREET3 Anemia work up No results for input(s): VITAMINB12, FOLATE, FERRITIN, TIBC, IRON, RETICCTPCT in the last 72 hours. Urinalysis    Component Value Date/Time   COLORURINE YELLOW 02/23/2021 0000   APPEARANCEUR CLEAR 02/23/2021 0000   LABSPEC 1.010 02/23/2021 0000   PHURINE 6.5 02/23/2021 0000   GLUCOSEU NEGATIVE 02/23/2021 0000   HGBUR NEGATIVE 02/23/2021 0000   BILIRUBINUR NEGATIVE 02/23/2021 0000   KETONESUR TRACE (A) 02/23/2021 0000   PROTEINUR NEGATIVE 02/23/2021 0000   UROBILINOGEN 1.0 03/17/2012 1615   NITRITE NEGATIVE 02/23/2021 0000   LEUKOCYTESUR NEGATIVE 02/23/2021 0000   Sepsis Labs Invalid input(s): PROCALCITONIN,  WBC,  LACTICIDVEN Microbiology Recent Results (from the past 240 hour(s))  Resp Panel by RT-PCR (Flu A&B, Covid) Nasopharyngeal Swab     Status: Abnormal   Collection Time: 02/22/21  4:00 PM   Specimen: Nasopharyngeal Swab; Nasopharyngeal(NP) swabs in vial transport medium  Result Value Ref Range Status   SARS Coronavirus 2 by RT PCR POSITIVE (A) NEGATIVE Final    Comment: RESULT CALLED TO, READ BACK BY AND VERIFIED WITH: ISAAC, RN @ 1714 ON 02/22/2021 BY LBROOKS, MLT (NOTE) SARS-CoV-2 target nucleic acids are DETECTED.  The SARS-CoV-2 RNA is generally detectable in upper respiratory specimens during the acute phase of infection. Positive results are indicative of the presence of the identified virus, but do not rule out bacterial infection or co-infection with other pathogens not detected by the test. Clinical correlation with  patient history and other diagnostic information is necessary to determine patient infection status. The expected result is Negative.  Fact Sheet for Patients: BloggerCourse.com  Fact Sheet for Healthcare Providers: SeriousBroker.it  This test is not yet approved or cleared by the [place] FDA and  has been authorized for detection and/or diagnosis of SARS-CoV-2 by FDA under an Emergency Use Authorization (EUA).  This EUA will remain in effect (meaning thi s test can be used) for the duration of  the COVID-19 declaration under Section 564(b)(1) of the Act, 21 U.S.C. section 360bbb-3(b)(1), unless the authorization is terminated or revoked sooner.     Influenza A by PCR NEGATIVE NEGATIVE Final   Influenza B by PCR NEGATIVE NEGATIVE Final    Comment: (NOTE) The Xpert Xpress SARS-CoV-2/FLU/RSV plus assay is intended as an aid in the diagnosis of influenza from Nasopharyngeal swab specimens and should not be used as a sole basis for treatment. Nasal washings and aspirates are unacceptable for Xpert Xpress SARS-CoV-2/FLU/RSV testing.  Fact Sheet for Patients: BloggerCourse.com  Fact Sheet for Healthcare Providers: SeriousBroker.it  This test is not yet approved or cleared by the [place] FDA and has been authorized for detection and/or diagnosis of SARS-CoV-2 by FDA under an Emergency Use Authorization (EUA). This EUA will remain in effect (meaning this test can be used) for the duration of the COVID-19 declaration under Section 564(b)(1) of the Act, 21 U.S.C. section 360bbb-3(b)(1), unless the authorization is terminated or revoked.  Performed at [facility], [address], [place], [place] [address]   Blood  Culture (routine x 2)     Status: None (Preliminary result)   Collection Time: 02/22/21  6:39 PM   Specimen: BLOOD  Result Value Ref Range  Status   Specimen Description   Final    BLOOD LEFT ANTECUBITAL Performed at [facility], [address] [place], [place] [address]    Special Requests   Final    BOTTLES DRAWN AEROBIC AND ANAEROBIC Blood Culture adequate volume Performed at [facility], [address] [place], [place] [address]    Culture   Final    NO GROWTH 4 DAYS Performed at [facility], [address] [place], [place] [address]    Report Status PENDING  Incomplete  Blood Culture (routine x 2)     Status: None (Preliminary result)   Collection Time: 02/22/21  9:56 PM   Specimen: BLOOD  Result Value Ref Range Status   Specimen Description   Final    BLOOD LEFT ANTECUBITAL Performed at [facility], [address], [place], [place] [address]    Special Requests   Final    BOTTLES DRAWN AEROBIC AND ANAEROBIC Blood Culture adequate volume Performed at [facility], [address] [place], [place] [address]    Culture   Final    NO GROWTH 4 DAYS Performed at [facility], [address] [place], [place] [address]    Report Status PENDING  Incomplete     Time coordinating discharge: Over 30 minutes  SIGNED:   Cipriano Bunker, MD  [facility] 02/26/2021, 11:24 AM Pager   If 7PM-7AM, please contact night-coverage

## 2021-02-26 NOTE — Plan of Care (Signed)
  Problem: Education: Goal: Knowledge of General Education information will improve Description: Including pain rating scale, medication(s)/side effects and non-pharmacologic comfort measures Outcome: Progressing   Problem: Health Behavior/Discharge Planning: Goal: Ability to manage health-related needs will improve Outcome: Progressing   Problem: Pain Managment: Goal: General experience of comfort will improve Outcome: Progressing   

## 2021-02-26 NOTE — Discharge Instructions (Signed)
Patient has completed remedesivir therapy for COVID infection.

## 2021-02-26 NOTE — Progress Notes (Signed)
The patient is alert and oriented and has been seen by his physician. The orders for discharge were written. IV has been removed. Went over discharge instructions with patient and family. He is being discharged via wheelchair with all of his belongings.  

## 2021-02-27 LAB — CULTURE, BLOOD (ROUTINE X 2)
Culture: NO GROWTH
Culture: NO GROWTH
Special Requests: ADEQUATE
Special Requests: ADEQUATE

## 2021-03-02 DIAGNOSIS — I2510 Atherosclerotic heart disease of native coronary artery without angina pectoris: Secondary | ICD-10-CM | POA: Diagnosis not present

## 2021-03-02 DIAGNOSIS — U071 COVID-19: Secondary | ICD-10-CM | POA: Diagnosis not present

## 2021-03-02 DIAGNOSIS — M47812 Spondylosis without myelopathy or radiculopathy, cervical region: Secondary | ICD-10-CM | POA: Diagnosis not present

## 2021-03-02 DIAGNOSIS — F0280 Dementia in other diseases classified elsewhere without behavioral disturbance: Secondary | ICD-10-CM | POA: Diagnosis not present

## 2021-03-02 DIAGNOSIS — Z7982 Long term (current) use of aspirin: Secondary | ICD-10-CM | POA: Diagnosis not present

## 2021-03-02 DIAGNOSIS — Z8673 Personal history of transient ischemic attack (TIA), and cerebral infarction without residual deficits: Secondary | ICD-10-CM | POA: Diagnosis not present

## 2021-03-02 DIAGNOSIS — G40909 Epilepsy, unspecified, not intractable, without status epilepticus: Secondary | ICD-10-CM | POA: Diagnosis not present

## 2021-03-02 DIAGNOSIS — G20 Parkinson's disease: Secondary | ICD-10-CM | POA: Diagnosis not present

## 2021-03-02 DIAGNOSIS — I10 Essential (primary) hypertension: Secondary | ICD-10-CM | POA: Diagnosis not present

## 2021-03-07 DIAGNOSIS — Z79899 Other long term (current) drug therapy: Secondary | ICD-10-CM | POA: Diagnosis not present

## 2021-03-07 DIAGNOSIS — U071 COVID-19: Secondary | ICD-10-CM | POA: Diagnosis not present

## 2021-04-06 DIAGNOSIS — G20 Parkinson's disease: Secondary | ICD-10-CM | POA: Diagnosis not present

## 2021-04-06 DIAGNOSIS — I2510 Atherosclerotic heart disease of native coronary artery without angina pectoris: Secondary | ICD-10-CM | POA: Diagnosis not present

## 2021-04-06 DIAGNOSIS — F321 Major depressive disorder, single episode, moderate: Secondary | ICD-10-CM | POA: Diagnosis not present

## 2021-04-06 DIAGNOSIS — I10 Essential (primary) hypertension: Secondary | ICD-10-CM | POA: Diagnosis not present

## 2021-04-06 DIAGNOSIS — E7800 Pure hypercholesterolemia, unspecified: Secondary | ICD-10-CM | POA: Diagnosis not present

## 2021-04-14 DIAGNOSIS — F02B18 Dementia in other diseases classified elsewhere, moderate, with other behavioral disturbance: Secondary | ICD-10-CM | POA: Diagnosis not present

## 2021-04-14 DIAGNOSIS — G20 Parkinson's disease: Secondary | ICD-10-CM | POA: Diagnosis not present

## 2021-04-14 DIAGNOSIS — R569 Unspecified convulsions: Secondary | ICD-10-CM | POA: Diagnosis not present

## 2021-04-14 DIAGNOSIS — E7800 Pure hypercholesterolemia, unspecified: Secondary | ICD-10-CM | POA: Diagnosis not present

## 2021-04-14 DIAGNOSIS — I10 Essential (primary) hypertension: Secondary | ICD-10-CM | POA: Diagnosis not present

## 2021-05-03 ENCOUNTER — Encounter: Payer: Self-pay | Admitting: Student

## 2021-05-03 ENCOUNTER — Encounter (HOSPITAL_COMMUNITY): Payer: Self-pay | Admitting: Radiology

## 2021-05-03 ENCOUNTER — Emergency Department (HOSPITAL_COMMUNITY): Payer: Medicare HMO

## 2021-05-03 ENCOUNTER — Emergency Department (HOSPITAL_COMMUNITY)
Admission: EM | Admit: 2021-05-03 | Discharge: 2021-05-03 | Disposition: A | Discharge status: Home / Self Care | Payer: Medicare HMO | Attending: Emergency Medicine | Admitting: Emergency Medicine

## 2021-05-03 DIAGNOSIS — I639 Cerebral infarction, unspecified: Secondary | ICD-10-CM

## 2021-05-03 DIAGNOSIS — R29818 Other symptoms and signs involving the nervous system: Secondary | ICD-10-CM | POA: Diagnosis not present

## 2021-05-03 DIAGNOSIS — Z7982 Long term (current) use of aspirin: Secondary | ICD-10-CM | POA: Insufficient documentation

## 2021-05-03 DIAGNOSIS — R531 Weakness: Secondary | ICD-10-CM | POA: Insufficient documentation

## 2021-05-03 DIAGNOSIS — R410 Disorientation, unspecified: Secondary | ICD-10-CM | POA: Diagnosis present

## 2021-05-03 DIAGNOSIS — R2990 Unspecified symptoms and signs involving the nervous system: Secondary | ICD-10-CM

## 2021-05-03 DIAGNOSIS — R9431 Abnormal electrocardiogram [ECG] [EKG]: Secondary | ICD-10-CM | POA: Diagnosis not present

## 2021-05-03 DIAGNOSIS — R4781 Slurred speech: Secondary | ICD-10-CM | POA: Diagnosis not present

## 2021-05-03 DIAGNOSIS — I6523 Occlusion and stenosis of bilateral carotid arteries: Secondary | ICD-10-CM | POA: Diagnosis not present

## 2021-05-03 DIAGNOSIS — G459 Transient cerebral ischemic attack, unspecified: Secondary | ICD-10-CM | POA: Diagnosis not present

## 2021-05-03 DIAGNOSIS — I6502 Occlusion and stenosis of left vertebral artery: Secondary | ICD-10-CM | POA: Diagnosis not present

## 2021-05-03 DIAGNOSIS — G319 Degenerative disease of nervous system, unspecified: Secondary | ICD-10-CM | POA: Diagnosis not present

## 2021-05-03 DIAGNOSIS — R4182 Altered mental status, unspecified: Secondary | ICD-10-CM | POA: Diagnosis not present

## 2021-05-03 DIAGNOSIS — Z79899 Other long term (current) drug therapy: Secondary | ICD-10-CM | POA: Insufficient documentation

## 2021-05-03 DIAGNOSIS — E041 Nontoxic single thyroid nodule: Secondary | ICD-10-CM | POA: Diagnosis not present

## 2021-05-03 DIAGNOSIS — R29810 Facial weakness: Secondary | ICD-10-CM | POA: Diagnosis not present

## 2021-05-03 LAB — CBC
HCT: 41.4 % (ref 39.0–52.0)
Hemoglobin: 13.8 g/dL (ref 13.0–17.0)
MCH: 30.4 pg (ref 26.0–34.0)
MCHC: 33.3 g/dL (ref 30.0–36.0)
MCV: 91.2 fL (ref 80.0–100.0)
Platelets: 189 10*3/uL (ref 150–400)
RBC: 4.54 MIL/uL (ref 4.22–5.81)
RDW: 14.6 % (ref 11.5–15.5)
WBC: 8.2 10*3/uL (ref 4.0–10.5)
nRBC: 0 % (ref 0.0–0.2)

## 2021-05-03 LAB — DIFFERENTIAL
Abs Immature Granulocytes: 0.02 10*3/uL (ref 0.00–0.07)
Basophils Absolute: 0 10*3/uL (ref 0.0–0.1)
Basophils Relative: 0 %
Eosinophils Absolute: 0.1 10*3/uL (ref 0.0–0.5)
Eosinophils Relative: 1 %
Immature Granulocytes: 0 %
Lymphocytes Relative: 18 %
Lymphs Abs: 1.5 10*3/uL (ref 0.7–4.0)
Monocytes Absolute: 0.8 10*3/uL (ref 0.1–1.0)
Monocytes Relative: 10 %
Neutro Abs: 5.8 10*3/uL (ref 1.7–7.7)
Neutrophils Relative %: 71 %

## 2021-05-03 LAB — URINALYSIS, ROUTINE W REFLEX MICROSCOPIC
Bilirubin Urine: NEGATIVE
Glucose, UA: NEGATIVE mg/dL
Hgb urine dipstick: NEGATIVE
Ketones, ur: 5 mg/dL — AB
Leukocytes,Ua: NEGATIVE
Nitrite: NEGATIVE
Protein, ur: NEGATIVE mg/dL
Specific Gravity, Urine: 1.025 (ref 1.005–1.030)
pH: 6 (ref 5.0–8.0)

## 2021-05-03 LAB — COMPREHENSIVE METABOLIC PANEL
ALT: 8 U/L (ref 0–44)
AST: 24 U/L (ref 15–41)
Albumin: 3.7 g/dL (ref 3.5–5.0)
Alkaline Phosphatase: 59 U/L (ref 38–126)
Anion gap: 10 (ref 5–15)
BUN: 15 mg/dL (ref 8–23)
CO2: 23 mmol/L (ref 22–32)
Calcium: 9 mg/dL (ref 8.9–10.3)
Chloride: 105 mmol/L (ref 98–111)
Creatinine, Ser: 1.22 mg/dL (ref 0.61–1.24)
GFR, Estimated: >60 mL/min (ref >60)
Glucose, Bld: 95 mg/dL (ref 70–99)
Potassium: 4 mmol/L (ref 3.5–5.1)
Sodium: 138 mmol/L (ref 135–145)
Total Bilirubin: 0.9 mg/dL (ref 0.3–1.2)
Total Protein: 6.8 g/dL (ref 6.5–8.1)

## 2021-05-03 LAB — I-STAT CHEM 8, ED
BUN: 17 mg/dL (ref 8–23)
Calcium, Ion: 1.14 mmol/L — ABNORMAL LOW (ref 1.15–1.40)
Chloride: 106 mmol/L (ref 98–111)
Creatinine, Ser: 1.2 mg/dL (ref 0.61–1.24)
Glucose, Bld: 93 mg/dL (ref 70–99)
HCT: 43 % (ref 39.0–52.0)
Hemoglobin: 14.6 g/dL (ref 13.0–17.0)
Potassium: 4 mmol/L (ref 3.5–5.1)
Sodium: 140 mmol/L (ref 135–145)
TCO2: 25 mmol/L (ref 22–32)

## 2021-05-03 LAB — APTT: aPTT: 22 seconds — ABNORMAL LOW (ref 24–36)

## 2021-05-03 LAB — PROTIME-INR
INR: 1 (ref 0.8–1.2)
Prothrombin Time: 13.2 seconds (ref 11.4–15.2)

## 2021-05-03 LAB — CBG MONITORING, ED: Glucose-Capillary: 94 mg/dL (ref 70–99)

## 2021-05-03 LAB — ETHANOL: Alcohol, Ethyl (B): <10 mg/dL (ref <10)

## 2021-05-03 MED ORDER — SODIUM CHLORIDE 0.9% FLUSH: 3.0000 mL | Freq: Once | INTRAVENOUS | Status: DC

## 2021-05-03 MED ORDER — IOHEXOL 350 MG/ML SOLN
75.0000 mL | Freq: Once | INTRAVENOUS | Status: AC | PRN
  Administered 2021-05-03: 75 mL via INTRAVENOUS

## 2021-05-03 NOTE — Consult Note (Signed)
Stroke Neurology Consultation Note  Consult Requested by: Dr. Roslynn Amble  Reason for Consult: code stroke.  Consult Date: 05/03/21   The history was obtained from the patient/EMS.  During history and examination, all items were  able to obtain unless otherwise noted.  History of Present Illness:  Tyrone Noble is a 71 y.o. African American male with h/o parkinsons, seizrues and dementia. Lives at home. Had a busy day yesterday due to up coming birthday with multiple phone calls. No missed meds. When he woke up his daughter reports left sided weakness and facial droop. Called EMS.   Met pt at the ER bridge. After quick exam taken to CT scanner.   LSN: [id number] Last night. tPA Given: No: out of window and not likely a CVA. mRS: 2 Not candidate for thrombectomy. CTA neg.  Past Medical History:  Diagnosis Date   Coronary artery disease    Dementia ([place])    Depression    Heart disease    Hypertension    Memory loss    Seizures ([place])    Stroke ([place])    "years ago" (02/16/2015)    Past Surgical History:  Procedure Laterality Date   CARDIAC CATHETERIZATION N/A 02/16/2015   Procedure: Left Heart Cath and Coronary Angiography;  Surgeon: Charolette Forward, MD;  Location: [facility];  Service: Cardiovascular;  Laterality: N/A;   CARDIAC CATHETERIZATION N/A 02/16/2015   Procedure: Coronary Stent Intervention;  Surgeon: Charolette Forward, MD;  Location: [facility];  Service: Cardiovascular;  Laterality: N/A;   CARDIAC CATHETERIZATION N/A 02/16/2015   Procedure: Intravascular Pressure Wire/FFR Study;  Surgeon: Charolette Forward, MD;  Location: [facility];  Service: Cardiovascular;  Laterality: N/A;   CORONARY ANGIOPLASTY     LEFT HEART CATH AND CORONARY ANGIOGRAPHY N/A 03/29/2017   Procedure: LEFT HEART CATH AND CORONARY ANGIOGRAPHY;  Surgeon: Charolette Forward, MD;  Location: [facility];  Service: Cardiovascular;  Laterality: N/A;   LEFT HEART CATHETERIZATION WITH CORONARY  ANGIOGRAM N/A 03/19/2012   Procedure: LEFT HEART CATHETERIZATION WITH CORONARY ANGIOGRAM;  Surgeon: Clent Demark, MD;  Location: [facility];  Service: Cardiovascular;  Laterality: N/A;   WISDOM TOOTH EXTRACTION      Family History  Problem Relation Age of Onset   Diabetes Mother    Hypertension Mother    Stroke Father    Cancer Sister     Social History:  reports that he has quit smoking. He has never used smokeless tobacco. He reports that he does not currently use alcohol. He reports that he does not use drugs.  Allergies:  Allergies  Allergen Reactions   Aricept [Donepezil Hcl] Nausea Only   Penicillins Nausea And Vomiting    Has patient had a PCN reaction causing immediate rash, facial/tongue/throat swelling, SOB or lightheadedness with hypotension: No  Has patient had a PCN reaction causing severe rash involving mucus membranes or skin necrosis: No  Has patient had a PCN reaction that required hospitalization: No  Has patient had a PCN reaction occurring within the last 10 years: No  If all of the above answers are "NO", then may proceed with Cephalosporin use.    No current facility-administered medications on file prior to encounter.   Current Outpatient Medications on File Prior to Encounter  Medication Sig Dispense Refill   aspirin 81 MG chewable tablet Chew 1 tablet (81 mg total) by mouth daily. 30 tablet 3   atorvastatin (LIPITOR) 20 MG tablet Take 20 mg by mouth  daily.   5   carbidopa-levodopa (SINEMET IR) 25-250 MG tablet Take 1 tablet by mouth 3 (three) times daily.     divalproex (DEPAKOTE ER) 500 MG 24 hr tablet Take 2 tablets (1,000 mg total) by mouth at bedtime. 180 tablet 3   levETIRAcetam (KEPPRA) 500 MG tablet Take 2 tablets (1,000 mg total) by mouth 2 (two) times daily. (Patient taking differently: Take 500 mg by mouth 2 (two) times daily.) 360 tablet 3   losartan (COZAAR) 100 MG tablet Take 50 mg by mouth daily.     metoprolol succinate (TOPROL-XL) 25  MG 24 hr tablet Take 25 mg by mouth daily.     Multiple Vitamin (MULTIVITAMIN WITH MINERALS) TABS tablet Take 1 tablet by mouth daily.     risperiDONE (RISPERDAL) 1 MG tablet Take 1 tablet (1 mg total) by mouth at bedtime. 30 tablet 11    Review of Systems: A full ROS was attempted today and was able to be performed.  Systems assessed include - Constitutional, Eyes, HENT, Respiratory, Cardiovascular, Gastrointestinal, Genitourinary, Integument/breast, Hematologic/lymphatic, Musculoskeletal, Neurological, Behavioral/Psych, Endocrine, Allergic/Immunologic - with pertinent responses as per HPI.  Physical Examination: Temp:  [97.7 F (36.5 C)] 97.7 F (36.5 C) (02/14 1110) Pulse Rate:  [70-72] 71 (02/14 1130) Resp:  [12-14] 13 (02/14 1130) BP: (131-142)/(86-88) 138/86 (02/14 1115) SpO2:  [100 %] 100 % (02/14 1130) Weight:  [73.2 kg] 73.2 kg (02/14 1000)  General - well nourished, well developed, in no apparent distress  Ophthalmologic - fundi not visualized due to noncooperation.    Cardiovascular - regular rhythm and rate  Mental Status -  Level of arousal and orientation to  person and DOB, year were intact. Slow to follow commands and answer question but able to do this. No aphasia. Able to name objects.    Cranial Nerves II - XII - II - Vision intact OU III, IV, VI - Extraocular movements intact V - Facial sensation intact bilaterally VII - Facial movement intact bilaterally VIII - Hearing & vestibular intact bilaterally X - Palate elevates symmetrically XI - Chin turning & shoulder shrug intact bilaterally XII - Tongue protrusion intact  Motor Strength - normal UE strength. Initially drift in both LE but this resolved after CT scan completed.   Motor Tone & Bulk - Muscle tone was assessed at the neck and appendages and was normal.  Bulk was normal and fasciculations were absent  Reflexes - The patients reflexes were normal in all extremities and he had no pathological  reflexes  Sensory - Light touch, temperature/pinprick were assessed and were normal  Coordination - The patient had normal movements in the hands and feet with no ataxia or dysmetria.  Tremor was absent  Gait and Station - deferred  NIHSS: 2 initially then 0.  Interval: Initial (02/14 1040) Level of Consciousness (1a.)   : Alert, keenly responsive (02/14 1100) LOC Questions (1b. )   +: Answers both questions correctly (02/14 1100) LOC Commands (1c. )   + : Performs both tasks correctly (02/14 1100) Best Gaze (2. )  +: Normal (02/14 1100) Visual (3. )  +: No visual loss (02/14 1100) Facial Palsy (4. )    : Normal symmetrical movements (02/14 1100) Motor Arm, Left (5a. )   +: No drift (02/14 1100) Motor Arm, Right (5b. )   +: No drift (02/14 1100) Motor Leg, Left (6a. )   +: No drift (02/14 1100) Motor Leg, Right (6b. )   +: No drift (02/14  1100) Limb Ataxia (7. ): Absent (02/14 1100) Sensory (8. )   +: Normal, no sensory loss (02/14 1100) Best Language (9. )   +: No aphasia (02/14 1100) Dysarthria (10. ): Normal (02/14 1100) Extinction/Inattention (11.)   +: No Abnormality (02/14 1100) Modified SS Total  +: 0 (02/14 1100) Complete NIHSS TOTAL: 0 (02/14 1100)   Data Reviewed: CT HEAD CODE STROKE WO CONTRAST  Result Date: 05/03/2021 CLINICAL DATA:  Code stroke. Acute neuro deficit. Right-sided weakness EXAM: CT HEAD WITHOUT CONTRAST TECHNIQUE: Contiguous axial images were obtained from the base of the skull through the vertex without intravenous contrast. RADIATION DOSE REDUCTION: This exam was performed according to the departmental dose-optimization program which includes automated exposure control, adjustment of the mA and/or kV according to patient size and/or use of iterative reconstruction technique. COMPARISON:  CT head 02/22/2021 FINDINGS: Brain: Negative for acute infarct, hemorrhage, mass. Generalized atrophy. Chronic microvascular ischemic change in the white matter and right  thalamus unchanged from the prior study. Vascular: Negative for hyperdense vessel Skull: Negative Sinuses/Orbits: Mild mucosal edema paranasal sinuses. Negative orbit Other: None ASPECTS ([place] Stroke Program Early CT Score) - Ganglionic level infarction (caudate, lentiform nuclei, internal capsule, insula, M1-M3 cortex): 7 - Supraganglionic infarction (M4-M6 cortex): 3 Total score (0-10 with 10 being normal): 10 IMPRESSION: 1. No acute intracranial abnormality 2. ASPECTS is 10 3. Code stroke imaging results were communicated on 05/03/2021 at 10:54 am to provider Raydon Chappuis via text message Electronically Signed   By: Franchot Gallo M.D.   On: 05/03/2021 10:56   CT ANGIO HEAD NECK W WO CM (CODE STROKE)  Result Date: 05/03/2021 CLINICAL DATA:  Right-sided weakness EXAM: CT ANGIOGRAPHY HEAD AND NECK TECHNIQUE: Multidetector CT imaging of the head and neck was performed using the standard protocol during bolus administration of intravenous contrast. Multiplanar CT image reconstructions and MIPs were obtained to evaluate the vascular anatomy. Carotid stenosis measurements (when applicable) are obtained utilizing NASCET criteria, using the distal internal carotid diameter as the denominator. RADIATION DOSE REDUCTION: This exam was performed according to the departmental dose-optimization program which includes automated exposure control, adjustment of the mA and/or kV according to patient size and/or use of iterative reconstruction technique. CONTRAST:  78m OMNIPAQUE IOHEXOL 350 MG/ML SOLN COMPARISON:  No prior CTA, correlation is made with CT head 05/03/2021 and 02/22/2021. FINDINGS: CT HEAD FINDINGS For noncontrast findings, please see same day CT head. CTA NECK FINDINGS Aortic arch: Two-vessel arch with a common origin of the brachiocephalic and left common carotid arteries. Imaged portion shows no evidence of aneurysm or dissection. No significant stenosis of the major arch vessel origins. Right carotid system: No  evidence of dissection, stenosis (50% or greater) or occlusion. Predominantly noncalcified plaque at the bifurcation is not hemodynamically significant. Left carotid system: No evidence of dissection, stenosis (50% or greater) or occlusion. Predominantly noncalcified plaque at the bifurcation is not hemodynamically significant but demonstrates focal ulceration (series 7, image 216). Vertebral arteries: Moderate narrowing at the origin of the left vertebral artery secondary to calcified plaque. No other evidence of dissection, stenosis (50% or greater) or occlusion. Skeleton: No acute osseous abnormality. Other neck: Subcentimeter hypoenhancing nodules in the thyroid. Otherwise negative. Upper chest: No focal pulmonary opacity or pleural effusion. Paraseptal and centrilobular emphysema. Review of the MIP images confirms the above findings CTA HEAD FINDINGS Anterior circulation: Both internal carotid arteries are patent to the termini, with mild calcifications but without significant stenosis. A1 segments patent, with a hypoplastic left A1. Normal  anterior communicating artery. Anterior cerebral arteries are patent to their distal aspects. No M1 stenosis or occlusion. Normal MCA bifurcations. Distal MCA branches perfused and symmetric. Posterior circulation: Vertebral arteries patent to the vertebrobasilar junction without stenosis. Posterior inferior cerebral arteries patent bilaterally. Basilar patent to its distal aspect. Superior cerebellar arteries patent bilaterally. Patent P1 segments. PCAs perfused to their distal aspects without stenosis. The bilateral posterior communicating arteries are not visualized. Venous sinuses: As permitted by contrast timing, patent. Anatomic variants: None significant. Review of the MIP images confirms the above findings IMPRESSION: 1.  No intracranial large vessel occlusion or significant stenosis. 2.  No hemodynamically significant stenosis in the neck. 3. Calcified and  noncalcified plaque at the ICA bifurcations is not hemodynamically significant but does demonstrate focal ulceration in the noncalcified plaque at the left ICA bifurcation. 4. Subcentimeter hypoenhancing nodules in the thyroid, for which no follow-up is recommended, given the size of these lesions. (Reference: J Am Coll Radiol. 2015 Feb;12(2): 143-50). Code stroke imaging results were communicated on 05/03/2021 at 11:15 am to provider [facility] via telephone, who verbally acknowledged these results. Electronically Signed   By: Merilyn Baba M.D.   On: 05/03/2021 11:24    Assessment: 71 y.o. male h/o dementia, seizures and parkinsons. No focal weakness on exam. CT/CTA neg. Code stroke cancelled.   Stroke Risk Factors - hypertension  Plan: - MRI of the brain without contrast. If neg d/c home. If new CVA will complete stroke workup. -Daily aspirin 89m   Discussed with ED.   Thank you for this consultation and allowing [facility] participate in the care of this patient.

## 2021-05-03 NOTE — Code Documentation (Signed)
Stroke Response Nurse Documentation Code Documentation  Tyrone Noble is a 71 y.o. male arriving to [facility] ED via [facility] on 05/03/2021 with past medical hx of dementia, parkinson's. On aspirin 81 mg daily. Code stroke was activated by EMS.   Patient from home where he was LKW on 05/02/18 at [place] when he was normal getting ready for bed. Stated he had a busier than normal day yesterday and was tired. Woke up this morning and daughter reports unilateral weakness and decreased communication. His baseline is oriented x1 and able to assist with ADLs.   Stroke team at the bedside on patient arrival. Labs drawn and patient cleared for CT by Dr. Roslynn Amble. Patient to CT with team. NIHSS 2, but improving to baseline throughout code stroke. See documentation for details and code stroke times. Patient initially with bilateral leg weakness on exam. The following imaging was completed:  CT, CTA head. Patient is not a candidate for IV Thrombolytic due to being out of window. Patient is not a candidate for IR due to  no suspected LVO.   Care/Plan: Code stroke cancelled per Wilfred Lacy, MD.   Bedside handoff with ED RN Carlis Abbott.  Meda Klinefelter  Stroke Response RN

## 2021-05-03 NOTE — ED Provider Notes (Signed)
[facility] EMERGENCY DEPARTMENT Provider Note   CSN: [id number] Arrival date & time: 05/03/21  1038     History  Chief Complaint  Patient presents with   Code Stroke    Tyrone OVERHOLSER is a 71 y.o. male.  Code stroke.  History obtained from EMS report, patient and family.  Patient was apparently in his normal state of health yesterday, last seen well yesterday evening around 7:30 PM.  This morning he seemed to be more confused and generally weak.  Daughter reports that since patient has been in ER his symptoms seem to be steadily improving.  Patient denies any acute complaint at present though he is unable to provide any complex history.  Answers basic questions.  Notable past medical history of Parkinson's, seizures, dementia.  Baseline per daughter ANO x1.  HPI     Home Medications Prior to Admission medications   Medication Sig Start Date End Date Taking? Authorizing Provider  aspirin 81 MG chewable tablet Chew 1 tablet (81 mg total) by mouth daily. 02/17/15  Yes Rinaldo Cloud, MD  atorvastatin (LIPITOR) 20 MG tablet Take 20 mg by mouth daily.  05/08/17  Yes [provider]  carbidopa-levodopa (SINEMET IR) 25-250 MG tablet Take 1 tablet by mouth in the morning, at noon, and at bedtime.   Yes [provider]  divalproex (DEPAKOTE ER) 500 MG 24 hr tablet Take 2 tablets (1,000 mg total) by mouth at bedtime. 01/29/20  Yes Levert Feinstein, MD  levETIRAcetam (KEPPRA) 500 MG tablet Take 2 tablets (1,000 mg total) by mouth 2 (two) times daily. Patient taking differently: Take 500 mg by mouth in the morning and at bedtime. 09/28/20  Yes Levert Feinstein, MD  losartan (COZAAR) 100 MG tablet Take 50 mg by mouth in the morning. 02/21/21  Yes [provider]  metoprolol succinate (TOPROL-XL) 25 MG 24 hr tablet Take 25 mg by mouth daily. 11/19/15  Yes [provider]  Multiple Vitamin (MULTIVITAMIN WITH MINERALS) TABS tablet Take 1 tablet by mouth  daily with breakfast.   Yes [provider]  risperiDONE (RISPERDAL) 1 MG tablet Take 1 tablet (1 mg total) by mouth at bedtime. 01/29/20  Yes Levert Feinstein, MD      Allergies    Aricept Palma Holter hcl] and Penicillins    Review of Systems   Review of Systems  Unable to perform ROS: Dementia   Physical Exam Updated Vital Signs BP 131/82    Pulse 74    Temp (!) 97.3 F (36.3 C) (Oral)    Resp 16    Wt 73.2 kg    SpO2 99%    BMI 26.05 kg/m  Physical Exam Vitals and nursing note reviewed.  Constitutional:      General: He is not in acute distress.    Appearance: He is well-developed.     Comments: Chronically ill but no distress  HENT:     Head: Normocephalic and atraumatic.  Eyes:     Conjunctiva/sclera: Conjunctivae normal.  Cardiovascular:     Rate and Rhythm: Normal rate and regular rhythm.     Heart sounds: No murmur heard. Pulmonary:     Effort: Pulmonary effort is normal. No respiratory distress.     Breath sounds: Normal breath sounds.  Abdominal:     Palpations: Abdomen is soft.     Tenderness: There is no abdominal tenderness.  Musculoskeletal:        General: No swelling.     Cervical back: Neck supple.  Skin:    General: Skin is warm and dry.     Capillary Refill: Capillary refill takes less than 2 seconds.  Neurological:     Mental Status: He is alert.     Comments: Alert, oriented to person, normal strength in his upper and lower extremities, sensation to light touch intact in upper and lower extremities,  Psychiatric:        Mood and Affect: Mood normal.    ED Results / Procedures / Treatments   Labs (all labs ordered are listed, but only abnormal results are displayed) Labs Reviewed  APTT - Abnormal; Notable for the following components:      Result Value   aPTT 22 (*)    All other components within normal limits  URINALYSIS, ROUTINE W REFLEX MICROSCOPIC - Abnormal; Notable for the following components:   Color, Urine AMBER (*)     APPearance HAZY (*)    Ketones, ur 5 (*)    All other components within normal limits  I-STAT CHEM 8, ED - Abnormal; Notable for the following components:   Calcium, Ion 1.14 (*)    All other components within normal limits  PROTIME-INR  CBC  DIFFERENTIAL  COMPREHENSIVE METABOLIC PANEL  ETHANOL  CBG MONITORING, ED    EKG EKG Interpretation  Date/Time:  Tuesday May 03 2021 11:08:28 EST Ventricular Rate:  71 PR Interval:    QRS Duration: 146 QT Interval:  438 QTC Calculation: 476 R Axis:   -71 Text Interpretation: Normal sinus rhythm RBBB and LAFB LVH by voltage Confirmed by Marianna Fuss ([id number]) on 05/03/2021 1:51:49 PM  Radiology MR BRAIN WO CONTRAST  Result Date: 05/03/2021 CLINICAL DATA:  Mental status change, unknown cause EXAM: MRI HEAD WITHOUT CONTRAST TECHNIQUE: Multiplanar, multiecho pulse sequences of the brain and surrounding structures were obtained without intravenous contrast. COMPARISON:  12/23/2020. FINDINGS: Evaluation is somewhat limited by motion artifact. Brain: No restricted diffusion to suggest acute or subacute infarct. No acute hemorrhage, mass, mass effect, or midline shift. Foci of hemosiderin deposition, primarily in the bilateral cerebellar hemispheres, likely sequela of prior hypertensive microhemorrhages. No hydrocephalus or extra-axial collection. Lacunar infarcts in the right centrum semiovale, right thalamus, and bilateral pons. T2 hyperintense signal in the periventricular white matter, likely the sequela of chronic small vessel ischemic disease. Mildly advanced cerebral atrophy for age. Vascular: Normal flow voids. Skull and upper cervical spine: Normal marrow signal. Sinuses/Orbits: Mucosal thickening in the maxillary sinuses and ethmoid air cells. The orbits are unremarkable. Other: None. IMPRESSION: No acute intracranial process. Electronically Signed   By: Wiliam Ke M.D.   On: 05/03/2021 12:40   CT HEAD CODE STROKE WO CONTRAST  Result  Date: 05/03/2021 CLINICAL DATA:  Code stroke. Acute neuro deficit. Right-sided weakness EXAM: CT HEAD WITHOUT CONTRAST TECHNIQUE: Contiguous axial images were obtained from the base of the skull through the vertex without intravenous contrast. RADIATION DOSE REDUCTION: This exam was performed according to the departmental dose-optimization program which includes automated exposure control, adjustment of the mA and/or kV according to patient size and/or use of iterative reconstruction technique. COMPARISON:  CT head 02/22/2021 FINDINGS: Brain: Negative for acute infarct, hemorrhage, mass. Generalized atrophy. Chronic microvascular ischemic change in the white matter and right thalamus unchanged from the prior study. Vascular: Negative for hyperdense vessel Skull: Negative Sinuses/Orbits: Mild mucosal edema paranasal sinuses. Negative orbit Other: None ASPECTS (Alberta Stroke Program Early CT Score) - Ganglionic level infarction (caudate, lentiform nuclei, internal capsule, insula, M1-M3 cortex): 7 - Supraganglionic infarction (M4-M6 cortex):  3 Total score (0-10 with 10 being normal): 10 IMPRESSION: 1. No acute intracranial abnormality 2. ASPECTS is 10 3. Code stroke imaging results were communicated on 05/03/2021 at 10:54 am to provider Palikh via text message Electronically Signed   By: Marlan Palauharles  Clark M.D.   On: 05/03/2021 10:56   CT ANGIO HEAD NECK W WO CM (CODE STROKE)  Result Date: 05/03/2021 CLINICAL DATA:  Right-sided weakness EXAM: CT ANGIOGRAPHY HEAD AND NECK TECHNIQUE: Multidetector CT imaging of the head and neck was performed using the standard protocol during bolus administration of intravenous contrast. Multiplanar CT image reconstructions and MIPs were obtained to evaluate the vascular anatomy. Carotid stenosis measurements (when applicable) are obtained utilizing NASCET criteria, using the distal internal carotid diameter as the denominator. RADIATION DOSE REDUCTION: This exam was performed  according to the departmental dose-optimization program which includes automated exposure control, adjustment of the mA and/or kV according to patient size and/or use of iterative reconstruction technique. CONTRAST:  75mL OMNIPAQUE IOHEXOL 350 MG/ML SOLN COMPARISON:  No prior CTA, correlation is made with CT head 05/03/2021 and 02/22/2021. FINDINGS: CT HEAD FINDINGS For noncontrast findings, please see same day CT head. CTA NECK FINDINGS Aortic arch: Two-vessel arch with a common origin of the brachiocephalic and left common carotid arteries. Imaged portion shows no evidence of aneurysm or dissection. No significant stenosis of the major arch vessel origins. Right carotid system: No evidence of dissection, stenosis (50% or greater) or occlusion. Predominantly noncalcified plaque at the bifurcation is not hemodynamically significant. Left carotid system: No evidence of dissection, stenosis (50% or greater) or occlusion. Predominantly noncalcified plaque at the bifurcation is not hemodynamically significant but demonstrates focal ulceration (series 7, image 216). Vertebral arteries: Moderate narrowing at the origin of the left vertebral artery secondary to calcified plaque. No other evidence of dissection, stenosis (50% or greater) or occlusion. Skeleton: No acute osseous abnormality. Other neck: Subcentimeter hypoenhancing nodules in the thyroid. Otherwise negative. Upper chest: No focal pulmonary opacity or pleural effusion. Paraseptal and centrilobular emphysema. Review of the MIP images confirms the above findings CTA HEAD FINDINGS Anterior circulation: Both internal carotid arteries are patent to the termini, with mild calcifications but without significant stenosis. A1 segments patent, with a hypoplastic left A1. Normal anterior communicating artery. Anterior cerebral arteries are patent to their distal aspects. No M1 stenosis or occlusion. Normal MCA bifurcations. Distal MCA branches perfused and symmetric.  Posterior circulation: Vertebral arteries patent to the vertebrobasilar junction without stenosis. Posterior inferior cerebral arteries patent bilaterally. Basilar patent to its distal aspect. Superior cerebellar arteries patent bilaterally. Patent P1 segments. PCAs perfused to their distal aspects without stenosis. The bilateral posterior communicating arteries are not visualized. Venous sinuses: As permitted by contrast timing, patent. Anatomic variants: None significant. Review of the MIP images confirms the above findings IMPRESSION: 1.  No intracranial large vessel occlusion or significant stenosis. 2.  No hemodynamically significant stenosis in the neck. 3. Calcified and noncalcified plaque at the ICA bifurcations is not hemodynamically significant but does demonstrate focal ulceration in the noncalcified plaque at the left ICA bifurcation. 4. Subcentimeter hypoenhancing nodules in the thyroid, for which no follow-up is recommended, given the size of these lesions. (Reference: J Am Coll Radiol. 2015 Feb;12(2): 143-50). Code stroke imaging results were communicated on 05/03/2021 at 11:15 am to [facility], who verbally acknowledged these results. Electronically Signed   By: Wiliam KeAlison  Vasan M.D.   On: 05/03/2021 11:24    Procedures Procedures    Medications Ordered in  ED Medications  iohexol (OMNIPAQUE) 350 MG/ML injection 75 mL (75 mLs Intravenous Contrast Given 05/03/21 1105)    ED Course/ Medical Decision Making/ A&P                           Medical Decision Making Amount and/or Complexity of Data Reviewed Labs: ordered. Radiology: ordered.  Risk Prescription drug management.   71 year old gentleman presented to ER from home as code stroke.  There was report of possible left-sided deficits as well as increased confusion from baseline.  Last known well was yesterday.  Throughout his ER stay his symptoms were resolving.  When I evaluated him I did not appreciate any focal  weakness, his speech was clear, he had no clear new neurologic deficit.  Was able to ambulate in department.  Back to baseline per family.  Basic lab work was obtained and reviewed, no electrolyte derangement or anemia.  CT head and CTA were negative.  Neurology recommended obtaining MRI and if negative then discharging patient and continuing his previously prescribed daily aspirin (81 mg.  If positive they recommended admission.  Independently reviewed MRI imaging and reviewed radiology report.  No acute stroke identified.  Given patient's current clinical status, recommendations from neurology, return to baseline, reassuring remainder of medical work-up, feel he can be discharged and managed in the outpatient setting.  Recommend follow-up with his neurologist and his primary care doctor.  Family including daughter updated throughout stay, patient will be discharged with family.  History obtained from patient, family, EMS report, chart review, review of discharge summary December 2022 for COVID.    After the discussed management above, the patient was determined to be safe for discharge.  The patient was in agreement with this plan and all questions regarding their care were answered.  ED return precautions were discussed and the patient will return to the ED with any significant worsening of condition.         Final Clinical Impression(s) / ED Diagnoses Final diagnoses:  Episode of transient neurologic symptoms    Rx / DC Orders ED Discharge Orders     None         Milagros Loll, MD 05/04/21 [id number]

## 2021-05-03 NOTE — ED Triage Notes (Incomplete)
Patient BIB [facility] from home for right sided weakness. History of dementia,

## 2021-05-03 NOTE — ED Notes (Signed)
Dc instructoins reviewed with family and pt.  Understanding was verbalized.  Pt Dc

## 2021-05-03 NOTE — Discharge Instructions (Addendum)
Please follow-up with primary care doctor, and your neurologist.    Return to ER if he has any additional episodes of decreased responsiveness, numbness, weakness, speech or vision change.

## 2021-05-05 DIAGNOSIS — L820 Inflamed seborrheic keratosis: Secondary | ICD-10-CM | POA: Diagnosis not present

## 2021-05-05 DIAGNOSIS — Z789 Other specified health status: Secondary | ICD-10-CM | POA: Diagnosis not present

## 2021-05-05 DIAGNOSIS — L538 Other specified erythematous conditions: Secondary | ICD-10-CM | POA: Diagnosis not present

## 2021-05-30 DIAGNOSIS — E7800 Pure hypercholesterolemia, unspecified: Secondary | ICD-10-CM | POA: Diagnosis not present

## 2021-05-30 DIAGNOSIS — I2510 Atherosclerotic heart disease of native coronary artery without angina pectoris: Secondary | ICD-10-CM | POA: Diagnosis not present

## 2021-05-30 DIAGNOSIS — I10 Essential (primary) hypertension: Secondary | ICD-10-CM | POA: Diagnosis not present

## 2021-06-28 DIAGNOSIS — E785 Hyperlipidemia, unspecified: Secondary | ICD-10-CM | POA: Diagnosis not present

## 2021-06-28 DIAGNOSIS — I10 Essential (primary) hypertension: Secondary | ICD-10-CM | POA: Diagnosis not present

## 2021-06-28 DIAGNOSIS — I25118 Atherosclerotic heart disease of native coronary artery with other forms of angina pectoris: Secondary | ICD-10-CM | POA: Diagnosis not present

## 2021-06-28 DIAGNOSIS — F17290 Nicotine dependence, other tobacco product, uncomplicated: Secondary | ICD-10-CM | POA: Diagnosis not present

## 2021-08-17 DIAGNOSIS — F321 Major depressive disorder, single episode, moderate: Secondary | ICD-10-CM | POA: Diagnosis not present

## 2021-08-17 DIAGNOSIS — G20 Parkinson's disease: Secondary | ICD-10-CM | POA: Diagnosis not present

## 2021-08-17 DIAGNOSIS — E7800 Pure hypercholesterolemia, unspecified: Secondary | ICD-10-CM | POA: Diagnosis not present

## 2021-08-17 DIAGNOSIS — I10 Essential (primary) hypertension: Secondary | ICD-10-CM | POA: Diagnosis not present

## 2021-08-17 DIAGNOSIS — I2510 Atherosclerotic heart disease of native coronary artery without angina pectoris: Secondary | ICD-10-CM | POA: Diagnosis not present

## 2021-09-21 ENCOUNTER — Other Ambulatory Visit: Payer: Self-pay | Admitting: Neurology

## 2021-10-04 DIAGNOSIS — I25118 Atherosclerotic heart disease of native coronary artery with other forms of angina pectoris: Secondary | ICD-10-CM | POA: Diagnosis not present

## 2021-10-04 DIAGNOSIS — I10 Essential (primary) hypertension: Secondary | ICD-10-CM | POA: Diagnosis not present

## 2021-10-04 DIAGNOSIS — F17290 Nicotine dependence, other tobacco product, uncomplicated: Secondary | ICD-10-CM | POA: Diagnosis not present

## 2021-10-04 DIAGNOSIS — E785 Hyperlipidemia, unspecified: Secondary | ICD-10-CM | POA: Diagnosis not present

## 2021-10-20 DIAGNOSIS — G20 Parkinson's disease: Secondary | ICD-10-CM | POA: Diagnosis not present

## 2021-10-20 DIAGNOSIS — R569 Unspecified convulsions: Secondary | ICD-10-CM | POA: Diagnosis not present

## 2021-10-31 ENCOUNTER — Encounter: Payer: Self-pay | Admitting: Neurology

## 2021-10-31 ENCOUNTER — Ambulatory Visit: Payer: Medicare HMO | Admitting: Neurology

## 2021-10-31 VITALS — BP 137/85 | HR 62 | Ht 66.0 in | Wt 164.0 lb

## 2021-10-31 DIAGNOSIS — G20 Parkinson's disease: Secondary | ICD-10-CM

## 2021-10-31 DIAGNOSIS — R569 Unspecified convulsions: Secondary | ICD-10-CM | POA: Diagnosis not present

## 2021-10-31 MED ORDER — CARBIDOPA-LEVODOPA 25-250 MG PO TABS: 1.0000 | ORAL_TABLET | Freq: Four times a day (QID) | ORAL | 3 refills | Status: DC

## 2021-10-31 MED ORDER — DIVALPROEX SODIUM ER 500 MG PO TB24: 1000.0000 mg | ORAL_TABLET | Freq: Every day | ORAL | 3 refills | Status: DC

## 2021-10-31 MED ORDER — LEVETIRACETAM 500 MG PO TABS: 500.0000 mg | ORAL_TABLET | Freq: Two times a day (BID) | ORAL | 3 refills | Status: DC

## 2021-10-31 NOTE — Progress Notes (Signed)
HISTORY OF PRESENT ILLNESS: Tyrone Noble is a 70 year old male, seen in refer by his primary care doctor Tyrone Noble for evaluation of seizure, he is accompanied by his son in law Tyrone Noble at today's clinical visit, he has lived with his daughter's family for 5 years.   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 Leta Noble in 2017 for dementia, he did also have a history of alcohol abuse, in remission for many years.   He presented to the emergency room on May 26, 2017 for seizure, he was ready to have breakfast, without warning signs, he began to stare into the space, not responding, whole body shaking, foaming, out of his mouth, postevent confusion last for a few minutes, paramedic was called, he was taken to the emergency room,   MRI of the brain showed no acute abnormality, generalized atrophy, moderate supratentorium small vessel disease.   Laboratory evaluation seen March 2019 showed normal CBC, BMP showed elevated creatinine 1.37, GFR of 51, negative troponin, UA was normal   He is now back to baseline, family denies significant agitations, Mini-Mental Status Examination only 18 out of 30 today   UPDATE Feb 26 2018: He is accompanied by his daughter and son in law at visit. He is overall stable,continue to complains of short term memory loss, slow walking since 2018, sleeps too much, poor appetite,    Last seizure was on Nov 30th 2019, he was awake, not responsive,  Feb 18 2018,  Sudden onset of disorientation confusion   He is taking Depakote DR 500 mg 2 tablets every night   UPDATE Feb 06 2019: Last visit was in October 2020, he had a recurrent seizure on December 19, 2018, his Keppra dose was  increased to 1000 mg twice a day, Depakote ER 200 mg daily   He continues to have seizure, had 2 recurrent seizure since last visit, the most recent one was on January 30, 2019, body shaking, not responsive   Laboratory evaluations December 24, 2018 showed Depakote level of 63, Keppra level of 38, CMP showed elevated creatinine 1.36, UDS was negative   He is taking Sinemet 25/100 mg 3 times a day, which has helped his walking,   In addition, he was noted to have worsening confusion, auditory hallucinations, scared, agitated sometimes,   He continue has mild gait abnormality, taking Sinemet 25/100 mg 3 times a day, which was helpful  UPDATE Jan 29 2020: He is accompanied by his daughter at today's clinical visit, he has been doing well over the past few months, last reported seizure was in July 2021, was found down at bathroom, in the middle of the seizing,  He is now taking Depakote ER 500 mg 2 tablets at bedtime, was on Keppra 500 mg 2 tablets twice a day, but by mistake, since November 2021, he was only getting Keppra 500 mg twice a day, daughter did not notice any difference, there was no recurrent seizure  He continued to [facility] times a [facility], taking Sinemet 25/100 mg 3 times a day, also Risperdal 1 mg every night,  Daughter is overall about his current progress  UPDATE August 14th 2023: He is accompanied by his daughter and son-in-law at today's clinical  visit, lives at home with his daughter, attending day program 5 days a week, overall doing well,  He had a small recurrent seizure in August 2023 after missing his levetiracetam, currently taking 500 mg twice a day along with Depakote ER 500 mg 2 tablets every night, risperidone 1 mg every night for evening time agitations  Overall is stable,  Examination today was performed 2 hours after last dose of Sinemet, significant parkinsonian features, will increase Sinemet 25/100 from 3 to 4 tablets  daily    REVIEW OF SYSTEMS: Out of a complete 14 system review of symptoms, the patient complains only of the following symptoms, and all other reviewed systems are negative.  Seizures, memory loss  ALLERGIES: Allergies  Allergen Reactions   Aricept [Donepezil Hcl] Nausea Only   Penicillins Nausea And Vomiting    Has patient had a PCN reaction causing immediate rash, facial/tongue/throat swelling, SOB or lightheadedness with hypotension: No  Has patient had a PCN reaction causing severe rash involving mucus membranes or skin necrosis: No  Has patient had a PCN reaction that required hospitalization: No  Has patient had a PCN reaction occurring within the last 10 years: No  If all of the above answers are "NO", then may proceed with Cephalosporin use.    HOME MEDICATIONS: Outpatient Medications Prior to Visit  Medication Sig Dispense Refill   aspirin 81 MG chewable tablet Chew 1 tablet (81 mg total) by mouth daily. 30 tablet 3   atorvastatin (LIPITOR) 20 MG tablet Take 20 mg by mouth daily.   5   carbidopa-levodopa (SINEMET IR) 25-250 MG tablet Take 1 tablet by mouth in the morning, at noon, and at bedtime.     divalproex (DEPAKOTE ER) 500 MG 24 hr tablet Take 2 tablets (1,000 mg total) by mouth at bedtime. 180 tablet 3   levETIRAcetam (KEPPRA) 500 MG tablet Take 2 tablets (1,000 mg total) by mouth 2 (two) times daily. (Patient taking differently: Take 500 mg by mouth in the morning and at bedtime.) 360 tablet 3   losartan (COZAAR) 100 MG tablet Take 50 mg by mouth in the morning.     metoprolol succinate (TOPROL-XL) 25 MG 24 hr tablet Take 25 mg by mouth daily.     Multiple Vitamins-Minerals (CENTRUM SILVER PO) Take 1 tablet by mouth daily.     risperiDONE (RISPERDAL) 1 MG tablet Take 1 tablet (1 mg total) by mouth at bedtime. 30 tablet 11   Multiple Vitamin (MULTIVITAMIN WITH MINERALS) TABS tablet Take 1 tablet by mouth daily with breakfast.     No facility-administered medications  prior to visit.    PAST MEDICAL HISTORY: Past Medical History:  Diagnosis Date   Coronary artery disease    Dementia (HCC)    Depression    Heart disease    Hypertension    Memory loss    Seizures (HCC)    Stroke (HCC)    "years ago" (02/16/2015)    PAST SURGICAL HISTORY: Past Surgical History:  Procedure Laterality Date   CARDIAC CATHETERIZATION N/A 02/16/2015   Procedure: Left Heart Cath and Coronary Angiography;  Surgeon: Rinaldo Cloud, MD;  Location: [facility] INVASIVE CV LAB;  Service: Cardiovascular;  Laterality: N/A;   CARDIAC CATHETERIZATION N/A 02/16/2015   Procedure: Coronary Stent Intervention;  Surgeon: Rinaldo Cloud, MD;  Location: [facility] INVASIVE CV LAB;  Service: Cardiovascular;  Laterality: N/A;   CARDIAC CATHETERIZATION N/A 02/16/2015   Procedure: Intravascular Pressure Wire/FFR Study;  Surgeon: Rinaldo Cloud, MD;  Location: [facility] INVASIVE CV  LAB;  Service: Cardiovascular;  Laterality: N/A;   CORONARY ANGIOPLASTY     LEFT HEART CATH AND CORONARY ANGIOGRAPHY N/A 03/29/2017   Procedure: LEFT HEART CATH AND CORONARY ANGIOGRAPHY;  Surgeon: Rinaldo Cloud, MD;  Location: [facility] INVASIVE CV LAB;  Service: Cardiovascular;  Laterality: N/A;   LEFT HEART CATHETERIZATION WITH CORONARY ANGIOGRAM N/A 03/19/2012   Procedure: LEFT HEART CATHETERIZATION WITH CORONARY ANGIOGRAM;  Surgeon: Robynn Pane, MD;  Location: [facility] CATH LAB;  Service: Cardiovascular;  Laterality: N/A;   WISDOM TOOTH EXTRACTION      FAMILY HISTORY: Family History  Problem Relation Age of Onset   Diabetes Mother    Hypertension Mother    Stroke Father    Cancer Sister     SOCIAL HISTORY: Social History   Socioeconomic History   Marital status: Divorced    Spouse name: Not on file   Number of children: 2   Years of education: 12   Highest education level: Not on file  Occupational History    Comment: retired Naval architect  Tobacco Use   Smoking status: Former   Smokeless tobacco: Never   Tobacco comments:     quit many years ago  Substance and Sexual Activity   Alcohol use: Not Currently    Comment: 02/16/2015 "last alcohol was in ~ 2012"   Drug use: No   Sexual activity: Not Currently  Other Topics Concern   Not on file  Social History Narrative   Lives with daughter    caffeine -coffee,  1 cup daily   Social Determinants of Health   Financial Resource Strain: Not on file  Food Insecurity: Not on file  Transportation Needs: Not on file  Physical Activity: Not on file  Stress: Not on file  Social Connections: Not on file  Intimate Partner Violence: Not on file   PHYSICAL EXAM  Vitals:   10/31/21 1453  BP: 137/85  Pulse: 62  Weight: 164 lb (74.4 kg)  Height: 5\' 6"  (1.676 m)   Body mass index is 26.47 kg/m.  Generalized: Well developed, in no acute distress     02/27/2019   12:44 PM 09/12/2017    1:25 PM 05/31/2017    5:00 PM  MMSE - Mini Mental State Exam  Orientation to time 0 1 1  Orientation to Place 3 4 3   Registration 3 3 3   Attention/ Calculation 0 5 3  Recall 0 0 0  Language- name 2 objects 2 2 2   Language- repeat 1 1 1   Language- follow 3 step command 3 3 3   Language- read & follow direction 1 1 1   Write a sentence 0 1 1  Copy design 0 0 0  Total score 13 21 18     Neurological examination  Mentation: Masked face, rely on his daughter to provide history, oriented to his name, but not age, compliant with neurological examination, Cranial nerve II-XII: Pupils were equal round reactive to light. Extraocular movements were full, visual field were full on confrontational test. Facial sensation and strength were normal. Head turning and shoulder shrug  were normal and symmetric. Motor: Left more than right arm and leg rigidity, bradykinesia, there was no significant weakness, Sensory: Intact to light touch Coordination: No dysmetria Reflexes: Hypoactive and symmetric Gait: He needs push-up to get up from seated position, leaning forward, decreased arm swing,  left worse than right, small stride,  DIAGNOSTIC DATA (LABS, IMAGING, TESTING) - I reviewed patient records, labs, notes, testing and imaging myself where available.  Lab Results  Component Value Date   WBC 8.2 05/03/2021   HGB 13.8 05/03/2021   HCT 41.4 05/03/2021   MCV 91.2 05/03/2021   PLT 189 05/03/2021      Component Value Date/Time   NA 138 05/03/2021 1050   NA 144 01/29/2020 0811   K 4.0 05/03/2021 1050   CL 105 05/03/2021 1050   CO2 23 05/03/2021 1050   GLUCOSE 95 05/03/2021 1050   BUN 15 05/03/2021 1050   BUN 12 01/29/2020 0811   CREATININE 1.22 05/03/2021 1050   CALCIUM 9.0 05/03/2021 1050   PROT 6.8 05/03/2021 1050   PROT 7.4 01/29/2020 0811   ALBUMIN 3.7 05/03/2021 1050   ALBUMIN 4.5 01/29/2020 0811   AST 24 05/03/2021 1050   ALT 8 05/03/2021 1050   ALKPHOS 59 05/03/2021 1050   BILITOT 0.9 05/03/2021 1050   BILITOT 0.4 01/29/2020 0811   GFRNONAA >60 05/03/2021 1050   GFRAA 77 01/29/2020 0811   Lab Results  Component Value Date   CHOL 186 03/18/2012   HDL 72 03/18/2012   LDLCALC 105 (H) 03/18/2012   TRIG 45 02/22/2021   CHOLHDL 2.6 03/18/2012   Lab Results  Component Value Date   HGBA1C 5.6 02/16/2016   Lab Results  Component Value Date   VITAMINB12 402 02/16/2016   Lab Results  Component Value Date   TSH 1.682 02/22/2021      ASSESSMENT AND PLAN 71 y.o. year old male  Parkinson's disease Dementia with behavioral issues Epilepsy  Most recurrent seizure was in August 2023, in the setting of missing his Keppra,  Keep current dose of Keppra 500 mg twice a day instead of 2 tablets twice a day, Depakote ER 500 mg 2 tablets every night  Will check level, keep Keppra 500 mg twice a day now, may adjust the dosage depending on the level  Increased Sinemet 25/100 mg to 4 times daily  Risperidone 1 mg every night as works well for him, no longer have significant sundowning agitation, also refilled risperidone  Return to clinic with nurse  practitioner in 6 months  Levert Feinstein, M.D. Ph.D.  [facility] [address] [place], [place] [address] Phone: [phone] Fax:      [phone]

## 2021-11-02 ENCOUNTER — Other Ambulatory Visit: Payer: Self-pay | Admitting: *Deleted

## 2021-11-03 ENCOUNTER — Telehealth: Payer: Self-pay | Admitting: Neurology

## 2021-11-03 NOTE — Telephone Encounter (Signed)
[facility] faxed over a form for Dr. Terrace Arabia to sign. They need just the his daytime meds listed (only the ones he will take while at the program - midday doses). This would only be his carbidopa-levodopa from our office.   Form update, signed by MD, faxed and confirmed back to the facility.

## 2021-11-03 NOTE — Telephone Encounter (Signed)
[facility] from [facility] called needing to speak to the RN regarding the pt's carbidopa-levodopa (SINEMET IR) 25-250 MG tablet instructions. Please advise.

## 2021-11-03 NOTE — Telephone Encounter (Addendum)
Left message for a return call.  The patient was seen in the office on 10/31/21. He attends [facility]. They have a current medication form that was completed this week. _____________________________________ Plan from visit this week w/ Dr Terrace Arabia:              Most recurrent seizure was in August 2023, in the setting of missing his Keppra,             Keep current dose of Keppra 500 mg twice a day instead of 2 tablets twice a day, Depakote ER 500 mg 2 tablets every night             Will check level, keep Keppra 500 mg twice a day now, may adjust the dosage depending on the level             Increased Sinemet 25/100 mg to 4 times daily             Risperidone 1 mg every night as works well for him, no longer have significant sundowning agitation, also refilled risperidone             Return to clinic with nurse practitioner in 6 months

## 2021-11-11 DIAGNOSIS — G20 Parkinson's disease: Secondary | ICD-10-CM | POA: Diagnosis not present

## 2021-11-11 DIAGNOSIS — I2510 Atherosclerotic heart disease of native coronary artery without angina pectoris: Secondary | ICD-10-CM | POA: Diagnosis not present

## 2021-11-11 DIAGNOSIS — E7800 Pure hypercholesterolemia, unspecified: Secondary | ICD-10-CM | POA: Diagnosis not present

## 2021-11-11 DIAGNOSIS — I10 Essential (primary) hypertension: Secondary | ICD-10-CM | POA: Diagnosis not present

## 2021-11-11 DIAGNOSIS — F321 Major depressive disorder, single episode, moderate: Secondary | ICD-10-CM | POA: Diagnosis not present

## 2021-11-18 DIAGNOSIS — G20 Parkinson's disease: Secondary | ICD-10-CM | POA: Diagnosis not present

## 2021-11-18 DIAGNOSIS — R7301 Impaired fasting glucose: Secondary | ICD-10-CM | POA: Diagnosis not present

## 2021-11-18 DIAGNOSIS — Z79899 Other long term (current) drug therapy: Secondary | ICD-10-CM | POA: Diagnosis not present

## 2021-11-18 DIAGNOSIS — I10 Essential (primary) hypertension: Secondary | ICD-10-CM | POA: Diagnosis not present

## 2021-11-18 DIAGNOSIS — E7800 Pure hypercholesterolemia, unspecified: Secondary | ICD-10-CM | POA: Diagnosis not present

## 2021-11-18 DIAGNOSIS — Z23 Encounter for immunization: Secondary | ICD-10-CM | POA: Diagnosis not present

## 2021-11-18 DIAGNOSIS — Z0000 Encounter for general adult medical examination without abnormal findings: Secondary | ICD-10-CM | POA: Diagnosis not present

## 2021-11-18 DIAGNOSIS — F0392 Unspecified dementia, unspecified severity, with psychotic disturbance: Secondary | ICD-10-CM | POA: Diagnosis not present

## 2021-11-18 DIAGNOSIS — I2510 Atherosclerotic heart disease of native coronary artery without angina pectoris: Secondary | ICD-10-CM | POA: Diagnosis not present

## 2021-11-18 DIAGNOSIS — F321 Major depressive disorder, single episode, moderate: Secondary | ICD-10-CM | POA: Diagnosis not present

## 2021-11-23 DIAGNOSIS — S91209A Unspecified open wound of unspecified toe(s) with damage to nail, initial encounter: Secondary | ICD-10-CM | POA: Diagnosis not present

## 2022-01-10 DIAGNOSIS — F17290 Nicotine dependence, other tobacco product, uncomplicated: Secondary | ICD-10-CM | POA: Diagnosis not present

## 2022-01-10 DIAGNOSIS — E785 Hyperlipidemia, unspecified: Secondary | ICD-10-CM | POA: Diagnosis not present

## 2022-01-10 DIAGNOSIS — I25118 Atherosclerotic heart disease of native coronary artery with other forms of angina pectoris: Secondary | ICD-10-CM | POA: Diagnosis not present

## 2022-01-10 DIAGNOSIS — I10 Essential (primary) hypertension: Secondary | ICD-10-CM | POA: Diagnosis not present

## 2022-01-17 ENCOUNTER — Telehealth: Payer: Self-pay | Admitting: *Deleted

## 2022-01-17 NOTE — Patient Outreach (Signed)
  Care Coordination   01/17/2022 Name: Tyrone Noble MRN: [id number] DOB: 1951-02-05   Care Coordination Outreach Attempts:  An unsuccessful telephone outreach was attempted today to offer the patient information about available care coordination services as a benefit of their health plan.   Follow Up Plan:  Additional outreach attempts will be made to offer the patient care coordination information and services.   Encounter Outcome:  No Answer  Care Coordination Interventions Activated:  No   Care Coordination Interventions:  No, not indicated    Raina Mina, RN Care Management Coordinator [place] Office [phone]

## 2022-05-05 DIAGNOSIS — I2510 Atherosclerotic heart disease of native coronary artery without angina pectoris: Secondary | ICD-10-CM | POA: Diagnosis not present

## 2022-05-05 DIAGNOSIS — E7800 Pure hypercholesterolemia, unspecified: Secondary | ICD-10-CM | POA: Diagnosis not present

## 2022-05-05 DIAGNOSIS — F331 Major depressive disorder, recurrent, moderate: Secondary | ICD-10-CM | POA: Diagnosis not present

## 2022-05-05 DIAGNOSIS — I10 Essential (primary) hypertension: Secondary | ICD-10-CM | POA: Diagnosis not present

## 2022-05-09 ENCOUNTER — Encounter: Payer: Self-pay | Admitting: Neurology

## 2022-05-09 ENCOUNTER — Ambulatory Visit: Payer: Medicare HMO | Admitting: Neurology

## 2022-05-09 VITALS — BP 146/72 | HR 59 | Ht 66.0 in | Wt 164.0 lb

## 2022-05-09 DIAGNOSIS — F0390 Unspecified dementia without behavioral disturbance: Secondary | ICD-10-CM

## 2022-05-09 DIAGNOSIS — R569 Unspecified convulsions: Secondary | ICD-10-CM

## 2022-05-09 DIAGNOSIS — G40909 Epilepsy, unspecified, not intractable, without status epilepticus: Secondary | ICD-10-CM

## 2022-05-09 MED ORDER — CARBIDOPA-LEVODOPA 25-250 MG PO TABS: 1.0000 | ORAL_TABLET | Freq: Four times a day (QID) | ORAL | 3 refills | Status: DC

## 2022-05-09 MED ORDER — DIVALPROEX SODIUM ER 500 MG PO TB24: 1000.0000 mg | ORAL_TABLET | Freq: Every day | ORAL | 3 refills | Status: DC

## 2022-05-09 MED ORDER — LEVETIRACETAM 750 MG PO TABS: 750.0000 mg | ORAL_TABLET | Freq: Two times a day (BID) | ORAL | 11 refills | Status: DC

## 2022-05-09 MED ORDER — RISPERIDONE 1 MG PO TABS: 1.0000 mg | ORAL_TABLET | Freq: Every day | ORAL | 11 refills | Status: DC

## 2022-05-09 NOTE — Patient Instructions (Signed)
We will increase Keppra to 750 mg twice daily, continue the Depakote   Check labs today   Call for seizures, watch for any worsening mood with higher dose Keppra  See you back in 6 months

## 2022-05-09 NOTE — Progress Notes (Signed)
HISTORY OF PRESENT ILLNESS: Tyrone Noble is a 72 year old male, seen in refer by his primary care doctor [name], [name] for evaluation of seizure, he is accompanied by his son in law [name] at today's clinical visit, he has lived with his daughter's family for 5 years.   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 Leta Baptist in 2017 for dementia, he did also have a history of alcohol abuse, in remission for many years.   He presented to the emergency room on May 26, 2017 for seizure, he was ready to have breakfast, without warning signs, he began to stare into the space, not responding, whole body shaking, foaming, out of his mouth, postevent confusion last for a few minutes, paramedic was called, he was taken to the emergency room,   MRI of the brain showed no acute abnormality, generalized atrophy, moderate supratentorium small vessel disease.   Laboratory evaluation seen March 2019 showed normal CBC, BMP showed elevated creatinine 1.37, GFR of 51, negative troponin, UA was normal   He is now back to baseline, family denies significant agitations, Mini-Mental Status Examination only 18 out of 30 today   UPDATE Feb 26 2018: He is accompanied by his daughter and son in law at visit. He is overall stable,continue to complains of short term memory loss, slow walking since 2018, sleeps too much, poor appetite,    Last seizure was on Nov 30th 2019, he was awake, not responsive,  Feb 18 2018,  Sudden onset of disorientation confusion   He is taking Depakote DR 500 mg 2 tablets every night   UPDATE Feb 06 2019: Last visit was in October 2020, he had a recurrent seizure on December 19, 2018, his Keppra dose was  increased to 1000 mg twice a day, Depakote ER 200 mg daily   He continues to have seizure, had 2 recurrent seizure since last visit, the most recent one was on January 30, 2019, body shaking, not responsive   Laboratory evaluations December 24, 2018 showed Depakote level of 63, Keppra level of 38, CMP showed elevated creatinine 1.36, UDS was negative   He is taking Sinemet 25/100 mg 3 times a day, which has helped his walking,   In addition, he was noted to have worsening confusion, auditory hallucinations, scared, agitated sometimes,   He continue has mild gait abnormality, taking Sinemet 25/100 mg 3 times a day, which was helpful  UPDATE Jan 29 2020: He is accompanied by his daughter at today's clinical visit, he has been doing well over the past few months, last reported seizure was in July 2021, was found down at bathroom, in the middle of the seizing,  He is now taking Depakote ER 500 mg 2 tablets at bedtime, was on Keppra 500 mg 2 tablets twice a day, but by mistake, since November 2021, he was only getting Keppra 500 mg twice a day, daughter did not notice any difference, there was no recurrent seizure  He continued to [facility] 5 times a week, taking Sinemet 25/100 mg 3 times a day, also Risperdal 1 mg every night,  Daughter is overall about his current progress  UPDATE August 14th 2023: He is accompanied by his daughter and son-in-law at today's clinical  visit, lives at home with his daughter, attending day program 5 days a week, overall doing well,  He had a small recurrent seizure in August 2023 after missing his levetiracetam, currently taking 500 mg twice a day along with Depakote ER 500 mg 2 tablets every night, risperidone 1 mg every night for evening time agitations  Overall is stable,  Examination today was performed 2 hours after last dose of Sinemet, significant parkinsonian features, will increase Sinemet 25/100 from 3 to 4 tablets daily  Update  May 09, 2022 SS: Here with son in [employer]. He is doing well. Last increased Sinemet 25/100 to 4 times daily, now taking 7 AM, 10 AM, 2 PM, 8 PM. Hasn't seen much change. Getting around okay, slow in the morning. Sleeping well at night, gets up 2-3 times at night. No falls. Takes 1 mg Risperdal at night keeps him calm. still on Keppra and Depakote. Reports 1 seizure in Dec while getting AM shower, eyes rolled back, he was shaking, confused for about 10 minutes.   REVIEW OF SYSTEMS: Out of a complete 14 system review of symptoms, the patient complains only of the following symptoms, and all other reviewed systems are negative.  See HPI  ALLERGIES: Allergies  Allergen Reactions   Aricept [Donepezil Hcl] Nausea Only   Penicillins Nausea And Vomiting    Has patient had a PCN reaction causing immediate rash, facial/tongue/throat swelling, SOB or lightheadedness with hypotension: No  Has patient had a PCN reaction causing severe rash involving mucus membranes or skin necrosis: No  Has patient had a PCN reaction that required hospitalization: No  Has patient had a PCN reaction occurring within the last 10 years: No  If all of the above answers are "NO", then may proceed with Cephalosporin use.    HOME MEDICATIONS: Outpatient Medications Prior to Visit  Medication Sig Dispense Refill   aspirin 81 MG chewable tablet Chew 1 tablet (81 mg total) by mouth daily. 30 tablet 3   atorvastatin (LIPITOR) 20 MG tablet Take 20 mg by mouth daily.   5   carbidopa-levodopa (SINEMET IR) 25-250 MG tablet Take 1 tablet by mouth 4 (four) times daily. 120 tablet 3   divalproex (DEPAKOTE ER) 500 MG 24 hr tablet Take 2 tablets (1,000 mg total) by mouth at bedtime. 180 tablet 3   levETIRAcetam (KEPPRA) 500 MG tablet Take 1 tablet (500 mg total) by mouth in the morning and at bedtime. 180 tablet 3   losartan (COZAAR) 100 MG tablet Take 50 mg by mouth in the morning.     metoprolol succinate (TOPROL-XL) 25 MG 24 hr tablet  Take 25 mg by mouth daily.     Multiple Vitamins-Minerals (CENTRUM SILVER PO) Take 1 tablet by mouth daily.     risperiDONE (RISPERDAL) 1 MG tablet Take 1 tablet (1 mg total) by mouth at bedtime. 30 tablet 11   No facility-administered medications prior to visit.    PAST MEDICAL HISTORY: Past Medical History:  Diagnosis Date   Coronary artery disease    Dementia ([place])    Depression    Heart disease    Hypertension    Memory loss    Seizures ([place])    Stroke ([place])    "years ago" (02/16/2015)    PAST SURGICAL HISTORY: Past Surgical History:  Procedure Laterality Date   CARDIAC CATHETERIZATION N/A 02/16/2015   Procedure: Left Heart Cath and Coronary Angiography;  Surgeon: Charolette Forward, MD;  Location: [facility];  Service: Cardiovascular;  Laterality: N/A;   CARDIAC CATHETERIZATION N/A 02/16/2015   Procedure: Coronary Stent Intervention;  Surgeon: Charolette Forward, MD;  Location: [facility];  Service: Cardiovascular;  Laterality: N/A;   CARDIAC CATHETERIZATION N/A 02/16/2015   Procedure: Intravascular Pressure Wire/FFR Study;  Surgeon: Charolette Forward, MD;  Location: [facility];  Service: Cardiovascular;  Laterality: N/A;   CORONARY ANGIOPLASTY     LEFT HEART CATH AND CORONARY ANGIOGRAPHY N/A 03/29/2017   Procedure: LEFT HEART CATH AND CORONARY ANGIOGRAPHY;  Surgeon: Charolette Forward, MD;  Location: [facility];  Service: Cardiovascular;  Laterality: N/A;   LEFT HEART CATHETERIZATION WITH CORONARY ANGIOGRAM N/A 03/19/2012   Procedure: LEFT HEART CATHETERIZATION WITH CORONARY ANGIOGRAM;  Surgeon: Clent Demark, MD;  Location: [facility];  Service: Cardiovascular;  Laterality: N/A;   WISDOM TOOTH EXTRACTION      FAMILY HISTORY: Family History  Problem Relation Age of Onset   Diabetes Mother    Hypertension Mother    Stroke Father    Cancer Sister     SOCIAL HISTORY: Social History   Socioeconomic History   Marital status: Divorced    Spouse  name: Not on file   Number of children: 2   Years of education: 12   Highest education level: Not on file  Occupational History    Comment: retired Administrator  Tobacco Use   Smoking status: Former   Smokeless tobacco: Never   Tobacco comments:    quit many years ago  Substance and Sexual Activity   Alcohol use: Not Currently    Comment: 02/16/2015 "last alcohol was in ~ 2012"   Drug use: No   Sexual activity: Not Currently  Other Topics Concern   Not on file  Social History Narrative   Lives with daughter    caffeine -coffee,  1 cup daily   Social Determinants of Health   Financial Resource Strain: Not on file  Food Insecurity: Not on file  Transportation Needs: Not on file  Physical Activity: Not on file  Stress: Not on file  Social Connections: Not on file  Intimate Partner Violence: Not on file   PHYSICAL EXAM  Vitals:   05/09/22 1439  BP: (!) 146/72  Pulse: (!) 59  Weight: 164 lb (74.4 kg)  Height: 5' 6"$  (1.676 m)    Body mass index is 26.47 kg/m.  Generalized: Well developed, in no acute distress     02/27/2019   12:44 PM 09/12/2017    1:25 PM 05/31/2017    5:00 PM  MMSE - Mini Mental State Exam  Orientation to time 0 1 1  Orientation to Place 3 4 3  $ Registration 3 3 3  $ Attention/ Calculation 0 5 3  Recall 0 0 0  Language- name 2 objects 2 2 2  $ Language- repeat 1 1 1  $ Language- follow 3 step command 3 3 3  $ Language- read & follow direction 1 1 1  $ Write a sentence 0 1 1  Copy design 0 0 0  Total score 13 21 18    $ Neurological examination  Mentation: Masked face, rely on son in law to provide history, oriented to his name, but not age, compliant with neurological examination, very nice cooperative  Cranial nerve II-XII: Pupils were equal round reactive to light. Extraocular movements were full, visual field were full on confrontational test. Facial sensation and strength were normal. Head turning and shoulder shrug  were normal and  symmetric. Motor: Left more than right arm  and leg rigidity, bradykinesia, there was no significant weakness Sensory: Intact to light touch Coordination: No dysmetria Reflexes: Hypoactive and symmetric Gait: He needs push-up to get up from seated position, leaning forward, decreased arm swing, left worse than right, small stride  DIAGNOSTIC DATA (LABS, IMAGING, TESTING) - I reviewed patient records, labs, notes, testing and imaging myself where available.  Lab Results  Component Value Date   WBC 8.2 05/03/2021   HGB 13.8 05/03/2021   HCT 41.4 05/03/2021   MCV 91.2 05/03/2021   PLT 189 05/03/2021      Component Value Date/Time   NA 138 05/03/2021 1050   NA 144 01/29/2020 0811   K 4.0 05/03/2021 1050   CL 105 05/03/2021 1050   CO2 23 05/03/2021 1050   GLUCOSE 95 05/03/2021 1050   BUN 15 05/03/2021 1050   BUN 12 01/29/2020 0811   CREATININE 1.22 05/03/2021 1050   CALCIUM 9.0 05/03/2021 1050   PROT 6.8 05/03/2021 1050   PROT 7.4 01/29/2020 0811   ALBUMIN 3.7 05/03/2021 1050   ALBUMIN 4.5 01/29/2020 0811   AST 24 05/03/2021 1050   ALT 8 05/03/2021 1050   ALKPHOS 59 05/03/2021 1050   BILITOT 0.9 05/03/2021 1050   BILITOT 0.4 01/29/2020 0811   GFRNONAA >60 05/03/2021 1050   GFRAA 77 01/29/2020 0811   Lab Results  Component Value Date   CHOL 186 03/18/2012   HDL 72 03/18/2012   LDLCALC 105 (H) 03/18/2012   TRIG 45 02/22/2021   CHOLHDL 2.6 03/18/2012   Lab Results  Component Value Date   HGBA1C 5.6 02/16/2016   Lab Results  Component Value Date   VITAMINB12 402 02/16/2016   Lab Results  Component Value Date   TSH 1.682 02/22/2021    ASSESSMENT AND PLAN 72 y.o. year old male  1.  Parkinson's disease 2.  Dementia with behavioral issues 3.  Epilepsy  -Increase Keppra 750 mg twice a day due to breakthrough seizure in December 2023 (on Keppra 500 twice a day, Depakote ER 500 mg 2 tablets at night), prior seizure in August 2023 missed Keppra dose  -Check routine  labs today including Keppra and Depakote level -Continue Sinemet 25/100 mg 1 tablet 4 times a day -Continue Risperdal 1 mg at night, no longer significant agitation -May start with increasing Keppra 500/750 x 1 week then 750/750 thereafter, watch for any mood changes  -Call for seizure years, follow-up in 6 months or sooner if needed  Butler Denmark, Laqueta Jean, [facility] [address], [place], [place] [address] [phone]

## 2022-05-11 LAB — CBC WITH DIFFERENTIAL/PLATELET
Basophils Absolute: 0 10*3/uL (ref 0.0–0.2)
Basos: 0 %
EOS (ABSOLUTE): 0.1 10*3/uL (ref 0.0–0.4)
Eos: 1 %
Hematocrit: 42.1 % (ref 37.5–51.0)
Hemoglobin: 14.2 g/dL (ref 13.0–17.7)
Immature Grans (Abs): 0 10*3/uL (ref 0.0–0.1)
Immature Granulocytes: 0 %
Lymphocytes Absolute: 2.6 10*3/uL (ref 0.7–3.1)
Lymphs: 47 %
MCH: 29.8 pg (ref 26.6–33.0)
MCHC: 33.7 g/dL (ref 31.5–35.7)
MCV: 88 fL (ref 79–97)
Monocytes Absolute: 0.5 10*3/uL (ref 0.1–0.9)
Monocytes: 9 %
Neutrophils Absolute: 2.5 10*3/uL (ref 1.4–7.0)
Neutrophils: 43 %
Platelets: 197 10*3/uL (ref 150–450)
RBC: 4.77 x10E6/uL (ref 4.14–5.80)
RDW: 13.2 % (ref 11.6–15.4)
WBC: 5.7 10*3/uL (ref 3.4–10.8)

## 2022-05-11 LAB — COMPREHENSIVE METABOLIC PANEL
ALT: 14 IU/L (ref 0–44)
AST: 20 IU/L (ref 0–40)
Albumin/Globulin Ratio: 1.9 (ref 1.2–2.2)
Albumin: 4.5 g/dL (ref 3.8–4.8)
Alkaline Phosphatase: 78 IU/L (ref 44–121)
BUN/Creatinine Ratio: 10 (ref 10–24)
BUN: 12 mg/dL (ref 8–27)
Bilirubin Total: 0.3 mg/dL (ref 0.0–1.2)
CO2: 27 mmol/L (ref 20–29)
Calcium: 9.7 mg/dL (ref 8.6–10.2)
Chloride: 104 mmol/L (ref 96–106)
Creatinine, Ser: 1.26 mg/dL (ref 0.76–1.27)
Globulin, Total: 2.4 g/dL (ref 1.5–4.5)
Glucose: 88 mg/dL (ref 70–99)
Potassium: 4.3 mmol/L (ref 3.5–5.2)
Sodium: 143 mmol/L (ref 134–144)
Total Protein: 6.9 g/dL (ref 6.0–8.5)
eGFR: 60 mL/min/{1.73_m2} (ref >59)

## 2022-05-11 LAB — LEVETIRACETAM LEVEL: Levetiracetam Lvl: 18.3 ug/mL (ref 10.0–40.0)

## 2022-05-11 LAB — VALPROIC ACID LEVEL: Valproic Acid Lvl: 72 ug/mL (ref 50–100)

## 2022-05-17 ENCOUNTER — Telehealth: Payer: Self-pay

## 2022-05-17 NOTE — Telephone Encounter (Signed)
Called and spoke with patient about note per sarah, who states he will keep [place] updated on the keppra medication and let us know any concerns/ Pt verbalized understanding.

## 2022-05-17 NOTE — Telephone Encounter (Signed)
-----   Message from Suzzanne Cloud, NP sent at 05/11/2022 12:33 PM EST ----- Please let him know blood work is normal. We have room to increase the Keppra, the dose was increased when I saw him this week due to a seizure in Dec. Thanks

## 2022-05-19 DIAGNOSIS — F02C2 Dementia in other diseases classified elsewhere, severe, with psychotic disturbance: Secondary | ICD-10-CM | POA: Diagnosis not present

## 2022-05-19 DIAGNOSIS — E7800 Pure hypercholesterolemia, unspecified: Secondary | ICD-10-CM | POA: Diagnosis not present

## 2022-05-19 DIAGNOSIS — G20A1 Parkinson's disease without dyskinesia, without mention of fluctuations: Secondary | ICD-10-CM | POA: Diagnosis not present

## 2022-05-19 DIAGNOSIS — I2510 Atherosclerotic heart disease of native coronary artery without angina pectoris: Secondary | ICD-10-CM | POA: Diagnosis not present

## 2022-05-19 DIAGNOSIS — F331 Major depressive disorder, recurrent, moderate: Secondary | ICD-10-CM | POA: Diagnosis not present

## 2022-05-19 DIAGNOSIS — R569 Unspecified convulsions: Secondary | ICD-10-CM | POA: Diagnosis not present

## 2022-05-19 DIAGNOSIS — I10 Essential (primary) hypertension: Secondary | ICD-10-CM | POA: Diagnosis not present

## 2022-06-15 ENCOUNTER — Inpatient Hospital Stay (HOSPITAL_COMMUNITY)
Admission: EM | Admit: 2022-06-15 | Discharge: 2022-06-18 | DRG: 177 | Disposition: A | Discharge status: Home Health | Payer: Medicare HMO | Attending: Internal Medicine | Admitting: Internal Medicine

## 2022-06-15 ENCOUNTER — Encounter (HOSPITAL_COMMUNITY): Payer: Self-pay | Admitting: Emergency Medicine

## 2022-06-15 ENCOUNTER — Emergency Department (HOSPITAL_COMMUNITY): Payer: Medicare HMO

## 2022-06-15 DIAGNOSIS — G9341 Metabolic encephalopathy: Secondary | ICD-10-CM | POA: Diagnosis present

## 2022-06-15 DIAGNOSIS — F02818 Dementia in other diseases classified elsewhere, unspecified severity, with other behavioral disturbance: Secondary | ICD-10-CM | POA: Diagnosis present

## 2022-06-15 DIAGNOSIS — R000 Tachycardia, unspecified: Secondary | ICD-10-CM | POA: Diagnosis not present

## 2022-06-15 DIAGNOSIS — Z823 Family history of stroke: Secondary | ICD-10-CM

## 2022-06-15 DIAGNOSIS — R0902 Hypoxemia: Secondary | ICD-10-CM | POA: Diagnosis not present

## 2022-06-15 DIAGNOSIS — Z8673 Personal history of transient ischemic attack (TIA), and cerebral infarction without residual deficits: Secondary | ICD-10-CM

## 2022-06-15 DIAGNOSIS — E440 Moderate protein-calorie malnutrition: Secondary | ICD-10-CM | POA: Diagnosis not present

## 2022-06-15 DIAGNOSIS — U071 COVID-19: Secondary | ICD-10-CM | POA: Diagnosis not present

## 2022-06-15 DIAGNOSIS — Z8616 Personal history of COVID-19: Secondary | ICD-10-CM

## 2022-06-15 DIAGNOSIS — Z6824 Body mass index (BMI) 24.0-24.9, adult: Secondary | ICD-10-CM | POA: Diagnosis not present

## 2022-06-15 DIAGNOSIS — R410 Disorientation, unspecified: Principal | ICD-10-CM

## 2022-06-15 DIAGNOSIS — Z7982 Long term (current) use of aspirin: Secondary | ICD-10-CM

## 2022-06-15 DIAGNOSIS — R32 Unspecified urinary incontinence: Secondary | ICD-10-CM | POA: Diagnosis present

## 2022-06-15 DIAGNOSIS — I2510 Atherosclerotic heart disease of native coronary artery without angina pectoris: Secondary | ICD-10-CM | POA: Diagnosis present

## 2022-06-15 DIAGNOSIS — I10 Essential (primary) hypertension: Secondary | ICD-10-CM | POA: Diagnosis present

## 2022-06-15 DIAGNOSIS — F0283 Dementia in other diseases classified elsewhere, unspecified severity, with mood disturbance: Secondary | ICD-10-CM | POA: Diagnosis present

## 2022-06-15 DIAGNOSIS — F32A Depression, unspecified: Secondary | ICD-10-CM | POA: Diagnosis not present

## 2022-06-15 DIAGNOSIS — Z880 Allergy status to penicillin: Secondary | ICD-10-CM

## 2022-06-15 DIAGNOSIS — R4182 Altered mental status, unspecified: Secondary | ICD-10-CM | POA: Diagnosis not present

## 2022-06-15 DIAGNOSIS — G20A1 Parkinson's disease without dyskinesia, without mention of fluctuations: Secondary | ICD-10-CM | POA: Diagnosis present

## 2022-06-15 DIAGNOSIS — Z833 Family history of diabetes mellitus: Secondary | ICD-10-CM | POA: Diagnosis not present

## 2022-06-15 DIAGNOSIS — Z79899 Other long term (current) drug therapy: Secondary | ICD-10-CM

## 2022-06-15 DIAGNOSIS — Z87891 Personal history of nicotine dependence: Secondary | ICD-10-CM

## 2022-06-15 DIAGNOSIS — R509 Fever, unspecified: Secondary | ICD-10-CM | POA: Diagnosis not present

## 2022-06-15 DIAGNOSIS — Z9861 Coronary angioplasty status: Secondary | ICD-10-CM

## 2022-06-15 DIAGNOSIS — R55 Syncope and collapse: Secondary | ICD-10-CM | POA: Diagnosis not present

## 2022-06-15 DIAGNOSIS — Z8249 Family history of ischemic heart disease and other diseases of the circulatory system: Secondary | ICD-10-CM

## 2022-06-15 DIAGNOSIS — G40909 Epilepsy, unspecified, not intractable, without status epilepticus: Secondary | ICD-10-CM | POA: Diagnosis not present

## 2022-06-15 DIAGNOSIS — R5381 Other malaise: Secondary | ICD-10-CM | POA: Diagnosis present

## 2022-06-15 DIAGNOSIS — R531 Weakness: Secondary | ICD-10-CM | POA: Diagnosis not present

## 2022-06-15 DIAGNOSIS — J9811 Atelectasis: Secondary | ICD-10-CM | POA: Diagnosis not present

## 2022-06-15 DIAGNOSIS — E785 Hyperlipidemia, unspecified: Secondary | ICD-10-CM | POA: Diagnosis present

## 2022-06-15 DIAGNOSIS — R519 Headache, unspecified: Secondary | ICD-10-CM | POA: Diagnosis not present

## 2022-06-15 DIAGNOSIS — F05 Delirium due to known physiological condition: Secondary | ICD-10-CM | POA: Diagnosis present

## 2022-06-15 DIAGNOSIS — F419 Anxiety disorder, unspecified: Secondary | ICD-10-CM | POA: Diagnosis present

## 2022-06-15 DIAGNOSIS — Z888 Allergy status to other drugs, medicaments and biological substances status: Secondary | ICD-10-CM

## 2022-06-15 DIAGNOSIS — I4510 Unspecified right bundle-branch block: Secondary | ICD-10-CM | POA: Diagnosis present

## 2022-06-15 LAB — COMPREHENSIVE METABOLIC PANEL
ALT: 9 U/L (ref 0–44)
AST: 70 U/L — ABNORMAL HIGH (ref 15–41)
Albumin: 3.3 g/dL — ABNORMAL LOW (ref 3.5–5.0)
Alkaline Phosphatase: 83 U/L (ref 38–126)
Anion gap: 10 (ref 5–15)
BUN: 12 mg/dL (ref 8–23)
CO2: 21 mmol/L — ABNORMAL LOW (ref 22–32)
Calcium: 8.6 mg/dL — ABNORMAL LOW (ref 8.9–10.3)
Chloride: 104 mmol/L (ref 98–111)
Creatinine, Ser: 1.16 mg/dL (ref 0.61–1.24)
GFR, Estimated: >60 mL/min (ref >60)
Glucose, Bld: 131 mg/dL — ABNORMAL HIGH (ref 70–99)
Potassium: 3.8 mmol/L (ref 3.5–5.1)
Sodium: 135 mmol/L (ref 135–145)
Total Bilirubin: 1 mg/dL (ref 0.3–1.2)
Total Protein: 6.2 g/dL — ABNORMAL LOW (ref 6.5–8.1)

## 2022-06-15 LAB — URINALYSIS, W/ REFLEX TO CULTURE (INFECTION SUSPECTED)
Bacteria, UA: NONE SEEN
Bilirubin Urine: NEGATIVE
Glucose, UA: NEGATIVE mg/dL
Hgb urine dipstick: NEGATIVE
Ketones, ur: 5 mg/dL — AB
Leukocytes,Ua: NEGATIVE
Nitrite: NEGATIVE
Protein, ur: NEGATIVE mg/dL
Specific Gravity, Urine: 1.009 (ref 1.005–1.030)
pH: 6 (ref 5.0–8.0)

## 2022-06-15 LAB — CBC WITH DIFFERENTIAL/PLATELET
Abs Immature Granulocytes: 0.01 10*3/uL (ref 0.00–0.07)
Basophils Absolute: 0 10*3/uL (ref 0.0–0.1)
Basophils Relative: 0 %
Eosinophils Absolute: 0 10*3/uL (ref 0.0–0.5)
Eosinophils Relative: 0 %
HCT: 38.7 % — ABNORMAL LOW (ref 39.0–52.0)
Hemoglobin: 13.3 g/dL (ref 13.0–17.0)
Immature Granulocytes: 0 %
Lymphocytes Relative: 11 %
Lymphs Abs: 0.8 10*3/uL (ref 0.7–4.0)
MCH: 30.4 pg (ref 26.0–34.0)
MCHC: 34.4 g/dL (ref 30.0–36.0)
MCV: 88.6 fL (ref 80.0–100.0)
Monocytes Absolute: 0.8 10*3/uL (ref 0.1–1.0)
Monocytes Relative: 11 %
Neutro Abs: 5.2 10*3/uL (ref 1.7–7.7)
Neutrophils Relative %: 78 %
Platelets: 160 10*3/uL (ref 150–400)
RBC: 4.37 MIL/uL (ref 4.22–5.81)
RDW: 13.7 % (ref 11.5–15.5)
WBC: 6.8 10*3/uL (ref 4.0–10.5)
nRBC: 0 % (ref 0.0–0.2)

## 2022-06-15 LAB — MAGNESIUM: Magnesium: 1.9 mg/dL (ref 1.7–2.4)

## 2022-06-15 LAB — TSH: TSH: 3.153 u[IU]/mL (ref 0.350–4.500)

## 2022-06-15 LAB — CBC
HCT: 39.3 % (ref 39.0–52.0)
Hemoglobin: 13.2 g/dL (ref 13.0–17.0)
MCH: 30.1 pg (ref 26.0–34.0)
MCHC: 33.6 g/dL (ref 30.0–36.0)
MCV: 89.7 fL (ref 80.0–100.0)
Platelets: 173 10*3/uL (ref 150–400)
RBC: 4.38 MIL/uL (ref 4.22–5.81)
RDW: 13.9 % (ref 11.5–15.5)
WBC: 6.9 10*3/uL (ref 4.0–10.5)
nRBC: 0 % (ref 0.0–0.2)

## 2022-06-15 LAB — PROTIME-INR
INR: 1.1 (ref 0.8–1.2)
Prothrombin Time: 13.8 seconds (ref 11.4–15.2)

## 2022-06-15 LAB — LACTIC ACID, PLASMA
Lactic Acid, Venous: 2 mmol/L (ref 0.5–1.9)
Lactic Acid, Venous: 1.4 mmol/L (ref 0.5–1.9)

## 2022-06-15 LAB — CREATININE, SERUM
Creatinine, Ser: 1.13 mg/dL (ref 0.61–1.24)
GFR, Estimated: >60 mL/min (ref >60)

## 2022-06-15 LAB — C-REACTIVE PROTEIN: CRP: 4.3 mg/dL — ABNORMAL HIGH (ref <1.0)

## 2022-06-15 LAB — APTT: aPTT: 26 seconds (ref 24–36)

## 2022-06-15 LAB — SARS CORONAVIRUS 2 BY RT PCR: SARS Coronavirus 2 by RT PCR: POSITIVE — AB

## 2022-06-15 LAB — PHOSPHORUS: Phosphorus: 3.6 mg/dL (ref 2.5–4.6)

## 2022-06-15 LAB — D-DIMER, QUANTITATIVE: D-Dimer, Quant: 0.72 ug/mL-FEU — ABNORMAL HIGH (ref 0.00–0.50)

## 2022-06-15 LAB — VALPROIC ACID LEVEL: Valproic Acid Lvl: 77 ug/mL (ref 50.0–100.0)

## 2022-06-15 LAB — TROPONIN I (HIGH SENSITIVITY)
Troponin I (High Sensitivity): 34 ng/L — ABNORMAL HIGH (ref <18)
Troponin I (High Sensitivity): 49 ng/L — ABNORMAL HIGH (ref <18)

## 2022-06-15 LAB — ETHANOL: Alcohol, Ethyl (B): <10 mg/dL (ref <10)

## 2022-06-15 MED ORDER — CARBIDOPA-LEVODOPA 25-250 MG PO TABS
1.0000 | ORAL_TABLET | Freq: Four times a day (QID) | ORAL | Status: DC
  Administered 2022-06-15 – 2022-06-18 (×13): 1 via ORAL
  Filled 2022-06-15 (×15): qty 1

## 2022-06-15 MED ORDER — HEPARIN SODIUM (PORCINE) 5000 UNIT/ML IJ SOLN
5000.0000 [IU] | Freq: Three times a day (TID) | INTRAMUSCULAR | Status: DC
  Administered 2022-06-15 – 2022-06-18 (×9): 5000 [IU] via SUBCUTANEOUS
  Filled 2022-06-15 (×9): qty 1

## 2022-06-15 MED ORDER — ACETAMINOPHEN 325 MG PO TABS
650.0000 mg | ORAL_TABLET | Freq: Once | ORAL | Status: AC
  Administered 2022-06-15: 650 mg via ORAL
  Filled 2022-06-15: qty 2

## 2022-06-15 MED ORDER — RISPERIDONE 1 MG PO TABS
1.0000 mg | ORAL_TABLET | Freq: Every day | ORAL | Status: DC
  Administered 2022-06-15 – 2022-06-17 (×3): 1 mg via ORAL
  Filled 2022-06-15 (×4): qty 1

## 2022-06-15 MED ORDER — LOSARTAN POTASSIUM 50 MG PO TABS
50.0000 mg | ORAL_TABLET | Freq: Every day | ORAL | Status: DC
  Administered 2022-06-15 – 2022-06-18 (×4): 50 mg via ORAL
  Filled 2022-06-15 (×4): qty 1

## 2022-06-15 MED ORDER — ATORVASTATIN CALCIUM 10 MG PO TABS: 20.0000 mg | ORAL_TABLET | Freq: Every day | ORAL | Status: DC

## 2022-06-15 MED ORDER — ADULT MULTIVITAMIN W/MINERALS CH
1.0000 | ORAL_TABLET | Freq: Every day | ORAL | Status: DC
  Administered 2022-06-15 – 2022-06-18 (×4): 1 via ORAL
  Filled 2022-06-15 (×4): qty 1

## 2022-06-15 MED ORDER — LEVETIRACETAM 500 MG PO TABS
750.0000 mg | ORAL_TABLET | Freq: Two times a day (BID) | ORAL | Status: DC
  Administered 2022-06-15 – 2022-06-18 (×7): 750 mg via ORAL
  Filled 2022-06-15 (×7): qty 1

## 2022-06-15 MED ORDER — ACETAMINOPHEN 650 MG RE SUPP: 650.0000 mg | Freq: Four times a day (QID) | RECTAL | Status: DC | PRN

## 2022-06-15 MED ORDER — DIVALPROEX SODIUM ER 500 MG PO TB24
1000.0000 mg | ORAL_TABLET | Freq: Every day | ORAL | Status: DC
  Administered 2022-06-15 – 2022-06-17 (×3): 1000 mg via ORAL
  Filled 2022-06-15 (×4): qty 2

## 2022-06-15 MED ORDER — ALBUTEROL SULFATE (2.5 MG/3ML) 0.083% IN NEBU: 2.5000 mg | INHALATION_SOLUTION | RESPIRATORY_TRACT | Status: DC | PRN

## 2022-06-15 MED ORDER — NIRMATRELVIR/RITONAVIR (PAXLOVID)TABLET
3.0000 | ORAL_TABLET | Freq: Two times a day (BID) | ORAL | Status: DC
  Administered 2022-06-15 – 2022-06-18 (×7): 3 via ORAL
  Filled 2022-06-15 (×2): qty 30

## 2022-06-15 MED ORDER — METOPROLOL SUCCINATE ER 25 MG PO TB24
25.0000 mg | ORAL_TABLET | Freq: Every day | ORAL | Status: DC
  Administered 2022-06-15 – 2022-06-18 (×4): 25 mg via ORAL
  Filled 2022-06-15 (×4): qty 1

## 2022-06-15 MED ORDER — ONDANSETRON HCL 4 MG PO TABS: 4.0000 mg | ORAL_TABLET | Freq: Four times a day (QID) | ORAL | Status: DC | PRN

## 2022-06-15 MED ORDER — ONDANSETRON HCL 4 MG/2ML IJ SOLN: 4.0000 mg | Freq: Four times a day (QID) | INTRAMUSCULAR | Status: DC | PRN

## 2022-06-15 MED ORDER — ACETAMINOPHEN 325 MG PO TABS
650.0000 mg | ORAL_TABLET | Freq: Four times a day (QID) | ORAL | Status: DC | PRN
  Administered 2022-06-15 – 2022-06-18 (×2): 650 mg via ORAL
  Filled 2022-06-15 (×2): qty 2

## 2022-06-15 MED ORDER — ASPIRIN 81 MG PO CHEW
81.0000 mg | CHEWABLE_TABLET | Freq: Every day | ORAL | Status: DC
  Administered 2022-06-15 – 2022-06-18 (×4): 81 mg via ORAL
  Filled 2022-06-15 (×4): qty 1

## 2022-06-15 NOTE — ED Notes (Signed)
ED TO INPATIENT HANDOFF REPORT  ED Nurse Name and Phone #: [phone]  S Name/Age/Gender Tyrone Noble 72 y.o. male Room/Bed: 037C/037C  Code Status   Code Status: Full Code  Home/SNF/Other Home Patient oriented to: self and place Is this baseline? Yes   Triage Complete: Triage complete  Chief Complaint COVID-19 [U07.1]  Triage Note Pt is complaining of right sided weakness and R sided head pain. He is leaning to that side now. Family called 911 due to "not himself" meaning he is talking less and moving less. They found him lying down on his bed with 1 episode of incontinence. Hx of seizures and Parkinsons. Pt has become more talkative with EMS. LSN was 0830.    Allergies Allergies  Allergen Reactions   Aricept [Donepezil Hcl] Nausea Only   Penicillins Nausea And Vomiting    Has patient had a PCN reaction causing immediate rash, facial/tongue/throat swelling, SOB or lightheadedness with hypotension: No  Has patient had a PCN reaction causing severe rash involving mucus membranes or skin necrosis: No  Has patient had a PCN reaction that required hospitalization: No  Has patient had a PCN reaction occurring within the last 10 years: No  If all of the above answers are "NO", then may proceed with Cephalosporin use.    Level of Care/Admitting Diagnosis ED Disposition     ED Disposition  Admit   Condition  --   [place]: [place] [[id number]]  Level of Care: Telemetry Medical [[id number]]  May admit patient to [facility] or [facility] if equivalent level of care is available:: No  Covid Evaluation: Confirmed COVID Positive  Diagnosis: COVID-19 [id number]  Admitting Physician: Clance Boll [id number]  Attending Physician: Clance Boll [id number]  Certification:: I certify this patient will need inpatient services for at least 2 midnights  Estimated Length of Stay: 3          B Medical/Surgery History Past Medical  History:  Diagnosis Date   Coronary artery disease    Dementia ([place])    Depression    Heart disease    Hypertension    Memory loss    Seizures ([place])    Stroke ([place])    "years ago" (02/16/2015)   Past Surgical History:  Procedure Laterality Date   CARDIAC CATHETERIZATION N/A 02/16/2015   Procedure: Left Heart Cath and Coronary Angiography;  Surgeon: Charolette Forward, MD;  Location: [facility] [facility];  Service: Cardiovascular;  Laterality: N/A;   CARDIAC CATHETERIZATION N/A 02/16/2015   Procedure: Coronary Stent Intervention;  Surgeon: Charolette Forward, MD;  Location: [facility];  Service: Cardiovascular;  Laterality: N/A;   CARDIAC CATHETERIZATION N/A 02/16/2015   Procedure: Intravascular Pressure Wire/FFR Study;  Surgeon: Charolette Forward, MD;  Location: [facility];  Service: Cardiovascular;  Laterality: N/A;   CORONARY ANGIOPLASTY     LEFT HEART CATH AND CORONARY ANGIOGRAPHY N/A 03/29/2017   Procedure: LEFT HEART CATH AND CORONARY ANGIOGRAPHY;  Surgeon: Charolette Forward, MD;  Location: [facility];  Service: Cardiovascular;  Laterality: N/A;   LEFT HEART CATHETERIZATION WITH CORONARY ANGIOGRAM N/A 03/19/2012   Procedure: LEFT HEART CATHETERIZATION WITH CORONARY ANGIOGRAM;  Surgeon: Clent Demark, MD;  Location: [facility];  Service: Cardiovascular;  Laterality: N/A;   WISDOM TOOTH EXTRACTION       A IV Location/Drains/Wounds Patient Lines/Drains/Airways Status     Active Line/Drains/Airways     Name Placement date Placement time Site Days  Peripheral IV 06/15/22 18 G 1.16" Right Antecubital 06/15/22  0650  Antecubital  less than 1            Intake/Output Last 24 hours No intake or output data in the 24 hours ending 06/15/22 1143  Labs/Imaging Results for orders placed or performed during the hospital encounter of 06/15/22 (from the past 48 hour(s))  CBC with Differential/Platelet     Status: Abnormal   Collection Time: 06/15/22  1:17 AM  Result  Value Ref Range   WBC 6.8 4.0 - 10.5 K/uL   RBC 4.37 4.22 - 5.81 MIL/uL   Hemoglobin 13.3 13.0 - 17.0 g/dL   HCT 38.7 (L) 39.0 - 52.0 %   MCV 88.6 80.0 - 100.0 fL   MCH 30.4 26.0 - 34.0 pg   MCHC 34.4 30.0 - 36.0 g/dL   RDW 13.7 11.5 - 15.5 %   Platelets 160 150 - 400 K/uL   nRBC 0.0 0.0 - 0.2 %   Neutrophils Relative % 78 %   Neutro Abs 5.2 1.7 - 7.7 K/uL   Lymphocytes Relative 11 %   Lymphs Abs 0.8 0.7 - 4.0 K/uL   Monocytes Relative 11 %   Monocytes Absolute 0.8 0.1 - 1.0 K/uL   Eosinophils Relative 0 %   Eosinophils Absolute 0.0 0.0 - 0.5 K/uL   Basophils Relative 0 %   Basophils Absolute 0.0 0.0 - 0.1 K/uL   Immature Granulocytes 0 %   Abs Immature Granulocytes 0.01 0.00 - 0.07 K/uL    Comment: Performed at [facility], [address] [place], [place] [address]  Comprehensive metabolic panel     Status: Abnormal   Collection Time: 06/15/22  1:17 AM  Result Value Ref Range   Sodium 135 135 - 145 mmol/L   Potassium 3.8 3.5 - 5.1 mmol/L   Chloride 104 98 - 111 mmol/L   CO2 21 (L) 22 - 32 mmol/L   Glucose, Bld 131 (H) 70 - 99 mg/dL    Comment: Glucose reference range applies only to samples taken after fasting for at least 8 hours.   BUN 12 8 - 23 mg/dL   Creatinine, Ser 1.16 0.61 - 1.24 mg/dL   Calcium 8.6 (L) 8.9 - 10.3 mg/dL   Total Protein 6.2 (L) 6.5 - 8.1 g/dL   Albumin 3.3 (L) 3.5 - 5.0 g/dL   AST 70 (H) 15 - 41 U/L   ALT 9 0 - 44 U/L   Alkaline Phosphatase 83 38 - 126 U/L   Total Bilirubin 1.0 0.3 - 1.2 mg/dL   GFR, Estimated >60 >60 mL/min    Comment: (NOTE) Calculated using the CKD-EPI Creatinine Equation (2021)    Anion gap 10 5 - 15    Comment: Performed at [facility], [place] [address], [place], [place] [address]  Troponin I (High Sensitivity)     Status: Abnormal   Collection Time: 06/15/22  1:17 AM  Result Value Ref Range   Troponin I (High Sensitivity) 34 (H) <18 ng/L    Comment: (NOTE) Elevated high sensitivity troponin I (hsTnI)  values and significant  changes across serial measurements may suggest ACS but many other  chronic and acute conditions are known to elevate hsTnI results.  Refer to the "Links" section for chest pain algorithms and additional  guidance. Performed at [facility], [place] [address], [place], [place] [address]   Ethanol     Status: None   Collection Time: 06/15/22  1:17 AM  Result Value Ref Range   Alcohol, Ethyl (B) <10 <10 mg/dL    Comment: (NOTE) Lowest detectable limit for serum alcohol is 10 mg/dL.  For medical purposes only. Performed at [facility], [place] [address], [place], [place] [address]   Valproic acid level     Status: None   Collection Time: 06/15/22  1:17 AM  Result Value Ref Range   Valproic Acid Lvl 77 50.0 - 100.0 ug/mL    Comment: Performed at [facility] [address], [place], [place] [address]  Lactic acid, plasma     Status: Abnormal   Collection Time: 06/15/22  1:17 AM  Result Value Ref Range   Lactic Acid, Venous 2.0 (HH) 0.5 - 1.9 mmol/L    Comment: CRITICAL RESULT CALLED TO, READ BACK BY AND VERIFIED WITH M. LAMBERT RN 06/15/22 @0225  BY J. WHITE Performed at [facility] [address], [place], [place] [address]   Protime-INR     Status: None   Collection Time: 06/15/22  1:17 AM  Result Value Ref Range   Prothrombin Time 13.8 11.4 - 15.2 seconds   INR 1.1 0.8 - 1.2    Comment: (NOTE) INR goal varies based on device and disease states. Performed at [facility], [place] [address] [place], [place] [address]   APTT     Status: None   Collection Time: 06/15/22  1:17 AM  Result Value Ref Range   aPTT 26 24 - 36 seconds    Comment: Performed at [facility] [address], [place], [place] [address]  SARS Coronavirus 2 by RT PCR (hospital order, performed in [facility] hospital lab) *cepheid single result test* Anterior Nasal Swab     Status: Abnormal   Collection Time: 06/15/22  3:02 AM   Specimen: Anterior  Nasal Swab  Result Value Ref Range   SARS Coronavirus 2 by RT PCR POSITIVE (A) NEGATIVE    Comment: Performed at [facility], [place] [address] [place], [place] [address]  Troponin I (High Sensitivity)     Status: Abnormal   Collection Time: 06/15/22  3:09 AM  Result Value Ref Range   Troponin I (High Sensitivity) 49 (H) <18 ng/L    Comment: (NOTE) Elevated high sensitivity troponin I (hsTnI) values and significant  changes across serial measurements may suggest ACS but many other  chronic and acute conditions are known to elevate hsTnI results.  Refer to the "Links" section for chest pain algorithms and additional  guidance. Performed at [facility], [place] [address], [place], [place] [address]   Lactic acid, plasma     Status: None   Collection Time: 06/15/22  3:18 AM  Result Value Ref Range   Lactic Acid, Venous 1.4 0.5 - 1.9 mmol/L    Comment: Performed at [facility] [address] [place], [place] [address]  Urinalysis, w/ Reflex to Culture (Infection Suspected) -Urine, Clean Catch     Status: Abnormal   Collection Time: 06/15/22  3:55 AM  Result Value Ref Range   Specimen Source URINE, CLEAN CATCH    Color, Urine YELLOW YELLOW   APPearance CLEAR CLEAR   Specific Gravity, Urine 1.009 1.005 - 1.030   pH 6.0 5.0 - 8.0   Glucose, UA NEGATIVE NEGATIVE mg/dL   Hgb urine dipstick NEGATIVE NEGATIVE   Bilirubin Urine NEGATIVE NEGATIVE   Ketones, ur 5 (A) NEGATIVE mg/dL   Protein, ur  NEGATIVE NEGATIVE mg/dL   Nitrite NEGATIVE NEGATIVE   Leukocytes,Ua NEGATIVE NEGATIVE   RBC / HPF 0-5 0 - 5 RBC/hpf   WBC, UA 0-5 0 - 5 WBC/hpf    Comment:        Reflex urine culture not performed if WBC <=10, OR if Squamous epithelial cells >5. If Squamous epithelial cells >5 suggest recollection.    Bacteria, UA NONE SEEN NONE SEEN   Squamous Epithelial / HPF 0-5 0 - 5 /HPF    Comment: Performed at [facility], [place] [address], [place] [address]  CBC      Status: None   Collection Time: 06/15/22  6:55 AM  Result Value Ref Range   WBC 6.9 4.0 - 10.5 K/uL   RBC 4.38 4.22 - 5.81 MIL/uL   Hemoglobin 13.2 13.0 - 17.0 g/dL   HCT 39.3 39.0 - 52.0 %   MCV 89.7 80.0 - 100.0 fL   MCH 30.1 26.0 - 34.0 pg   MCHC 33.6 30.0 - 36.0 g/dL   RDW 13.9 11.5 - 15.5 %   Platelets 173 150 - 400 K/uL   nRBC 0.0 0.0 - 0.2 %    Comment: Performed at [facility], [place] [address], [place], [place] [address]  Creatinine, serum     Status: None   Collection Time: 06/15/22  6:55 AM  Result Value Ref Range   Creatinine, Ser 1.13 0.61 - 1.24 mg/dL   GFR, Estimated >60 >60 mL/min    Comment: (NOTE) Calculated using the CKD-EPI Creatinine Equation (2021) Performed at [facility] [address], [place], [place] [address]   Magnesium     Status: None   Collection Time: 06/15/22  6:55 AM  Result Value Ref Range   Magnesium 1.9 1.7 - 2.4 mg/dL    Comment: Performed at [facility] [address] [place], [place] [address]  Phosphorus     Status: None   Collection Time: 06/15/22  6:55 AM  Result Value Ref Range   Phosphorus 3.6 2.5 - 4.6 mg/dL    Comment: Performed at [facility] [address] [place], [place] [address]  D-dimer, quantitative     Status: Abnormal   Collection Time: 06/15/22  6:55 AM  Result Value Ref Range   D-Dimer, Quant 0.72 (H) 0.00 - 0.50 ug/mL-FEU    Comment: (NOTE) At the manufacturer cut-off value of 0.5 g/mL FEU, this assay has a negative predictive value of 95-100%.This assay is intended for use in conjunction with a clinical pretest probability (PTP) assessment model to exclude pulmonary embolism (PE) and deep venous thrombosis (DVT) in outpatients suspected of PE or DVT. Results should be correlated with clinical presentation. Performed at [facility], [place] [address], [place], [place] [address]   C-reactive protein     Status: Abnormal   Collection Time: 06/15/22  6:55 AM  Result Value  Ref Range   CRP 4.3 (H) <1.0 mg/dL    Comment: Performed at [facility] [address] [place], [place] [address]  TSH     Status: None   Collection Time: 06/15/22  6:55 AM  Result Value Ref Range   TSH 3.153 0.350 - 4.500 uIU/mL    Comment: Performed by a 3rd Generation assay with a functional sensitivity of <=0.01 uIU/mL. Performed at [facility], [place] [address], [place], [place] [address]    CT HEAD WO CONTRAST (5MM)  Result  Date: 06/15/2022 CLINICAL DATA:  Altered mental status and right-sided weakness, initial encounter EXAM: CT HEAD WITHOUT CONTRAST TECHNIQUE: Contiguous axial images were obtained from the base of the skull through the vertex without intravenous contrast. RADIATION DOSE REDUCTION: This exam was performed according to the departmental dose-optimization program which includes automated exposure control, adjustment of the mA and/or kV according to patient size and/or use of iterative reconstruction technique. COMPARISON:  05/03/2021 FINDINGS: Brain: No evidence of acute infarction, hemorrhage, hydrocephalus, extra-axial collection or mass lesion/mass effect. Chronic atrophic and ischemic changes are noted. Vascular: No hyperdense vessel or unexpected calcification. Skull: Normal. Negative for fracture or focal lesion. Sinuses/Orbits: No acute finding. Other: None. IMPRESSION: Chronic atrophic and ischemic changes without acute abnormality. Electronically Signed   By: Inez Catalina M.D.   On: 06/15/2022 02:20   DG Chest Port 1 View  Result Date: 06/15/2022 CLINICAL DATA:  Mental status change and fever. EXAM: PORTABLE CHEST 1 VIEW COMPARISON:  02/22/2021 FINDINGS: Shallow inspiration with probable linear atelectasis in the lung bases. Heart size and pulmonary vascularity are normal. No focal consolidation in the lungs. No pleural effusions. No pneumothorax. Mediastinal contours appear intact. IMPRESSION: Shallow inspiration with linear atelectasis in the lung bases.  No focal consolidation. Electronically Signed   By: Lucienne Capers M.D.   On: 06/15/2022 01:26    Pending Labs Unresulted Labs (From admission, onward)     Start     Ordered   06/16/22 0500  Comprehensive metabolic panel  Tomorrow morning,   R        06/15/22 0605   06/16/22 0500  CBC  Tomorrow morning,   R        06/15/22 0605   06/15/22 0643  Urinalysis, Routine w reflex microscopic -Urine, Clean Catch  Once,   R       Question:  Specimen Source  Answer:  Urine, Clean Catch   06/15/22 0642   06/15/22 0105  Blood Culture (routine x 2)  (Undifferentiated presentation (screening labs and basic nursing orders))  BLOOD CULTURE X 2,   STAT      06/15/22 0105            Vitals/Pain Today's Vitals   06/15/22 1000 06/15/22 1100 06/15/22 1104 06/15/22 1140  BP: (!) 158/73 (!) 153/80    Pulse: 91 92    Resp: (!) 22 18    Temp:    100.3 F (37.9 C)  TempSrc:    Oral  SpO2: 98% 97%    PainSc:   Asleep     Isolation Precautions Airborne and Contact precautions  Medications Medications  aspirin chewable tablet 81 mg (81 mg Oral Given 06/15/22 0918)  losartan (COZAAR) tablet 50 mg (50 mg Oral Given 06/15/22 0919)  metoprolol succinate (TOPROL-XL) 24 hr tablet 25 mg (25 mg Oral Given 06/15/22 0919)  risperiDONE (RISPERDAL) tablet 1 mg (has no administration in time range)  carbidopa-levodopa (SINEMET IR) 25-250 MG per tablet immediate release 1 tablet (1 tablet Oral Given 06/15/22 0920)  divalproex (DEPAKOTE ER) 24 hr tablet 1,000 mg (has no administration in time range)  levETIRAcetam (KEPPRA) tablet 750 mg (750 mg Oral Given 06/15/22 0919)  multivitamin with minerals tablet 1 tablet (1 tablet Oral Given 06/15/22 0920)  nirmatrelvir/ritonavir (PAXLOVID) 3 tablet (3 tablets Oral Given 06/15/22 0954)  heparin injection 5,000 Units (5,000 Units Subcutaneous Given 06/15/22 0656)  acetaminophen (TYLENOL) tablet 650 mg (650 mg Oral Given 06/15/22 1141)    Or  acetaminophen (TYLENOL)  suppository 650  mg ( Rectal See Alternative 06/15/22 1141)  ondansetron (ZOFRAN) tablet 4 mg (has no administration in time range)    Or  ondansetron (ZOFRAN) injection 4 mg (has no administration in time range)  albuterol (PROVENTIL) (2.5 MG/3ML) 0.083% nebulizer solution 2.5 mg (has no administration in time range)  acetaminophen (TYLENOL) tablet 650 mg (650 mg Oral Given 06/15/22 0115)    Mobility walks     Focused Assessments Neuro Assessment Handoff:  Swallow screen pass? Yes  Cardiac Rhythm: Sinus tachycardia       Neuro Assessment: Exceptions to [facility] Neuro Checks:      Has TPA been given? No If patient is a Neuro Trauma and patient is going to OR before floor call report to [place] nurse: [phone] or [phone]  , Pulmonary Assessment Handoff:  Lung sounds: Bilateral Breath Sounds: Diminished O2 Device: Room Air      R Recommendations: See Admitting Provider Note  Report given to:   Additional Notes: pt covid +, normally toilets himself, but has been incontinent x 2 days. He has a history of dementia, is oriented x 1-2 (can give you name and birth month and day (no birth year), knows he is in a hospital. Patient also has parkinson's, so baseline tremor/ataxia.

## 2022-06-15 NOTE — Progress Notes (Addendum)
PROGRESS NOTE  PUAL CHEADLE [id number] DOB: [birth year] DOA: 06/15/2022 PCP: Lujean Amel, MD  HPI/Recap of past 2 hours: 72 year old male with past medical history significant for hypertension, hyperlipidemia, coronary renal disease, chronic anxiety/depression, CVA, seizure disorder, Parkinson's disease, dementia, who presented to ED with confusion and agitation from his baseline.  Workup in the ED reveals COVID-19 viral infection with acute metabolic encephalopathy.  Not hypoxic.  Started on Paxlovid.  06/15/2022: The patient was seen and examined at his bedside in the emergency department.  He had no new complaints.  He was calm and resting in his room.  Assessment/Plan: Principal Problem:   COVID-19  COVID -19 infection with Acute metabolic encephalopathy  Not hypoxic. Reorient as needed. Continue Paxlovid and DVT prophylaxis Continue to monitor O2 saturation   Hypertension  -continue on metoprolol , losartan Closely monitor vital signs   Seizure disorder Resume home regimen Continue seizure precautions.   Hyperlipidemia Continue home atorvastatin    Coronary artery disease Denies any anginal symptoms -continue on metoprolol ,asa, statin    Depression Dementia with behavioral changes -on depakote /risperidone   CVA -continue secondary ppx wth asa     Parkinson's disease  -continue on sinemet    Time: 35 minutes.     DVT prophylaxis: Heparin sq 3 times daily            Code Status:FULL Family Communication:   Kandace Blitz (Daughter) [phone] (Mobile)    Disposition Plan: patient  expected to be admitted greater than 2 midnights  Consults called: n/a Admission status: med tele      Code Status: Full code  Family Communication: None at bedside  Disposition Plan: Likely will discharge to home in the next 48 hours.   Consultants: None none  Procedures:    Status is: Inpatient The patient requires at least 2 midnights for  further evaluation and treatment of present condition.    Objective: Vitals:   06/15/22 1140 06/15/22 1200 06/15/22 1212 06/15/22 1307  BP:  (!) 151/94  137/72  Pulse:  (!) 112 99 77  Resp:  15 (!) 21 18  Temp: 100.3 F (37.9 C)     TempSrc: Oral     SpO2:  98% 97% 97%   No intake or output data in the 24 hours ending 06/15/22 1345 There were no vitals filed for this visit.  Exam:  General: 72 y.o. year-old male well developed well nourished in no acute distress.  Alert and oriented x3. Cardiovascular: Regular rate and rhythm with no rubs or gallops.  No thyromegaly or JVD noted.   Respiratory: Clear to auscultation with no wheezes or rales. Good inspiratory effort. Abdomen: Soft nontender nondistended with normal bowel sounds x4 quadrants. Musculoskeletal: No lower extremity edema. 2/4 pulses in all 4 extremities. Skin: No ulcerative lesions noted or rashes, Psychiatry: Mood is appropriate for condition and setting   Data Reviewed: CBC: Recent Labs  Lab 06/15/22 0117 06/15/22 0655  WBC 6.8 6.9  NEUTROABS 5.2  --   HGB 13.3 13.2  HCT 38.7* 39.3  MCV 88.6 89.7  PLT 160 [id number]   Basic Metabolic Panel: Recent Labs  Lab 06/15/22 0117 06/15/22 0655  NA 135  --   K 3.8  --   CL 104  --   CO2 21*  --   GLUCOSE 131*  --   BUN 12  --   CREATININE 1.16 1.13  CALCIUM 8.6*  --   MG  --  1.9  PHOS  --  3.6   GFR: CrCl cannot be calculated (Unknown ideal weight.). Liver Function Tests: Recent Labs  Lab 06/15/22 0117  AST 70*  ALT 9  ALKPHOS 83  BILITOT 1.0  PROT 6.2*  ALBUMIN 3.3*   No results for input(s): "LIPASE", "AMYLASE" in the last 168 hours. No results for input(s): "AMMONIA" in the last 168 hours. Coagulation Profile: Recent Labs  Lab 06/15/22 0117  INR 1.1   Cardiac Enzymes: No results for input(s): "CKTOTAL", "CKMB", "CKMBINDEX", "TROPONINI" in the last 168 hours. BNP (last 3 results) No results for input(s): "PROBNP" in the last 8760  hours. HbA1C: No results for input(s): "HGBA1C" in the last 72 hours. CBG: No results for input(s): "GLUCAP" in the last 168 hours. Lipid Profile: No results for input(s): "CHOL", "HDL", "LDLCALC", "TRIG", "CHOLHDL", "LDLDIRECT" in the last 72 hours. Thyroid Function Tests: Recent Labs    06/15/22 0655  TSH 3.153   Anemia Panel: No results for input(s): "VITAMINB12", "FOLATE", "FERRITIN", "TIBC", "IRON", "RETICCTPCT" in the last 72 hours. Urine analysis:    Component Value Date/Time   COLORURINE YELLOW 06/15/2022 [place] 06/15/2022 0355   LABSPEC 1.009 06/15/2022 0355   PHURINE 6.0 06/15/2022 0355   GLUCOSEU NEGATIVE 06/15/2022 0355   HGBUR NEGATIVE 06/15/2022 0355   BILIRUBINUR NEGATIVE 06/15/2022 0355   KETONESUR 5 (A) 06/15/2022 0355   PROTEINUR NEGATIVE 06/15/2022 0355   UROBILINOGEN 1.0 03/17/2012 1615   NITRITE NEGATIVE 06/15/2022 0355   LEUKOCYTESUR NEGATIVE 06/15/2022 0355   Sepsis Labs: @LABRCNTIP (procalcitonin:4,lacticidven:4)  ) Recent Results (from the past 240 hour(s))  SARS Coronavirus 2 by RT PCR (hospital order, performed in [facility] hospital lab) *cepheid single result test* Anterior Nasal Swab     Status: Abnormal   Collection Time: 06/15/22  3:02 AM   Specimen: Anterior Nasal Swab  Result Value Ref Range Status   SARS Coronavirus 2 by RT PCR POSITIVE (A) NEGATIVE Final    Comment: Performed at [facility], [place] [address], [place], [place] [address]      Studies: CT HEAD WO CONTRAST (5MM)  Result Date: 06/15/2022 CLINICAL DATA:  Altered mental status and right-sided weakness, initial encounter EXAM: CT HEAD WITHOUT CONTRAST TECHNIQUE: Contiguous axial images were obtained from the base of the skull through the vertex without intravenous contrast. RADIATION DOSE REDUCTION: This exam was performed according to the departmental dose-optimization program which includes automated exposure control, adjustment of the mA and/or kV  according to patient size and/or use of iterative reconstruction technique. COMPARISON:  05/03/2021 FINDINGS: Brain: No evidence of acute infarction, hemorrhage, hydrocephalus, extra-axial collection or mass lesion/mass effect. Chronic atrophic and ischemic changes are noted. Vascular: No hyperdense vessel or unexpected calcification. Skull: Normal. Negative for fracture or focal lesion. Sinuses/Orbits: No acute finding. Other: None. IMPRESSION: Chronic atrophic and ischemic changes without acute abnormality. Electronically Signed   By: Inez Catalina M.D.   On: 06/15/2022 02:20   DG Chest Port 1 View  Result Date: 06/15/2022 CLINICAL DATA:  Mental status change and fever. EXAM: PORTABLE CHEST 1 VIEW COMPARISON:  02/22/2021 FINDINGS: Shallow inspiration with probable linear atelectasis in the lung bases. Heart size and pulmonary vascularity are normal. No focal consolidation in the lungs. No pleural effusions. No pneumothorax. Mediastinal contours appear intact. IMPRESSION: Shallow inspiration with linear atelectasis in the lung bases. No focal consolidation. Electronically Signed   By: Lucienne Capers M.D.   On: 06/15/2022 01:26    Scheduled Meds:  aspirin  81 mg Oral Daily  carbidopa-levodopa  1 tablet Oral QID   divalproex  1,000 mg Oral QHS   heparin  5,000 Units Subcutaneous Q8H   levETIRAcetam  750 mg Oral BID   losartan  50 mg Oral Daily   metoprolol succinate  25 mg Oral Daily   multivitamin with minerals  1 tablet Oral Daily   nirmatrelvir/ritonavir  3 tablet Oral BID   risperiDONE  1 mg Oral QHS    Continuous Infusions:   LOS: 0 days     Kayleen Memos, MD [facility] Pager [phone]  If 7PM-7AM, please contact night-coverage [email or web address] Password [email or web address] 06/15/2022, 1:45 PM

## 2022-06-15 NOTE — ED Triage Notes (Signed)
Pt is complaining of right sided weakness and R sided head pain. He is leaning to that side now. Family called 911 due to "not himself" meaning he is talking less and moving less. They found him lying down on his bed with 1 episode of incontinence. Hx of seizures and Parkinsons. Pt has become more talkative with EMS. LSN was 0830.

## 2022-06-15 NOTE — ED Provider Notes (Signed)
[facility] Provider Note   CSN: [id number] Arrival date & time: 06/15/22  0055     History  Chief Complaint  Patient presents with   Altered Mental Status    Tyrone Noble is a 72 y.o. male.  Patient brought to the emerged department for evaluation of altered mental status.  Patient comes to the emergency department by ambulance from home.  Family told EMS that they found him lying on the bed around 8:30 PM and he has not been himself.  He was incontinent of urine.  Patient does have a history of seizure disorder, family was not with him and cannot stay definitively if he had a seizure.  Patient has a history of baseline dementia, cannot add much to his history.       Home Medications Prior to Admission medications   Medication Sig Start Date End Date Taking? Authorizing Provider  aspirin 81 MG chewable tablet Chew 1 tablet (81 mg total) by mouth daily. 02/17/15  Yes Charolette Forward, MD  atorvastatin (LIPITOR) 20 MG tablet Take 20 mg by mouth daily.  05/08/17  Yes [provider]  carbidopa-levodopa (SINEMET IR) 25-250 MG tablet Take 1 tablet by mouth 4 (four) times daily. 05/09/22  Yes Suzzanne Cloud, NP  divalproex (DEPAKOTE ER) 500 MG 24 hr tablet Take 2 tablets (1,000 mg total) by mouth at bedtime. 05/09/22  Yes Suzzanne Cloud, NP  levETIRAcetam (KEPPRA) 750 MG tablet Take 1 tablet (750 mg total) by mouth 2 (two) times daily. 05/09/22  Yes Suzzanne Cloud, NP  losartan (COZAAR) 100 MG tablet Take 50 mg by mouth in the morning. 02/21/21  Yes [provider]  metoprolol succinate (TOPROL-XL) 25 MG 24 hr tablet Take 25 mg by mouth daily. 11/19/15  Yes [provider]  Multiple Vitamins-Minerals (CENTRUM SILVER PO) Take 1 tablet by mouth daily.   Yes [provider]  risperiDONE (RISPERDAL) 1 MG tablet Take 1 tablet (1 mg total) by mouth at bedtime. 05/09/22  Yes Suzzanne Cloud, NP      Allergies     Aricept Reather Littler hcl] and Penicillins    Review of Systems   Review of Systems  Physical Exam Updated Vital Signs BP (!) 145/79   Pulse 87   Temp 99.9 F (37.7 C) (Oral)   Resp 17   SpO2 100%  Physical Exam Vitals and nursing note reviewed.  Constitutional:      General: He is not in acute distress.    Appearance: He is well-developed.  HENT:     Head: Normocephalic and atraumatic.     Mouth/Throat:     Mouth: Mucous membranes are moist.  Eyes:     General: Vision grossly intact. Gaze aligned appropriately.     Extraocular Movements: Extraocular movements intact.     Conjunctiva/sclera: Conjunctivae normal.  Cardiovascular:     Rate and Rhythm: Normal rate and regular rhythm.     Pulses: Normal pulses.     Heart sounds: Normal heart sounds, S1 normal and S2 normal. No murmur heard.    No friction rub. No gallop.  Pulmonary:     Effort: Pulmonary effort is normal. No respiratory distress.     Breath sounds: Normal breath sounds.  Abdominal:     Palpations: Abdomen is soft.     Tenderness: There is no abdominal tenderness. There is no guarding or rebound.     Hernia: No hernia is present.  Musculoskeletal:  General: No swelling.     Cervical back: Full passive range of motion without pain, normal range of motion and neck supple. No pain with movement, spinous process tenderness or muscular tenderness. Normal range of motion.     Right lower leg: No edema.     Left lower leg: No edema.  Skin:    General: Skin is warm and dry.     Capillary Refill: Capillary refill takes less than 2 seconds.     Findings: No ecchymosis, erythema, lesion or wound.  Neurological:     Mental Status: He is alert. He is confused.     GCS: GCS eye subscore is 4. GCS verbal subscore is 4. GCS motor subscore is 6.     Cranial Nerves: Cranial nerves 2-12 are intact.     Sensory: Sensation is intact.     Motor: Motor function is intact. No weakness or abnormal muscle tone.      Coordination: Coordination is intact.  Psychiatric:        Mood and Affect: Mood normal.        Speech: Speech normal.        Behavior: Behavior normal.     ED Results / Procedures / Treatments   Labs (all labs ordered are listed, but only abnormal results are displayed) Labs Reviewed  SARS CORONAVIRUS 2 BY RT PCR - Abnormal; Notable for the following components:      Result Value   SARS Coronavirus 2 by RT PCR POSITIVE (*)    All other components within normal limits  CBC WITH DIFFERENTIAL/PLATELET - Abnormal; Notable for the following components:   HCT 38.7 (*)    All other components within normal limits  COMPREHENSIVE METABOLIC PANEL - Abnormal; Notable for the following components:   CO2 21 (*)    Glucose, Bld 131 (*)    Calcium 8.6 (*)    Total Protein 6.2 (*)    Albumin 3.3 (*)    AST 70 (*)    All other components within normal limits  LACTIC ACID, PLASMA - Abnormal; Notable for the following components:   Lactic Acid, Venous 2.0 (*)    All other components within normal limits  URINALYSIS, W/ REFLEX TO CULTURE (INFECTION SUSPECTED) - Abnormal; Notable for the following components:   Ketones, ur 5 (*)    All other components within normal limits  TROPONIN I (HIGH SENSITIVITY) - Abnormal; Notable for the following components:   Troponin I (High Sensitivity) 34 (*)    All other components within normal limits  TROPONIN I (HIGH SENSITIVITY) - Abnormal; Notable for the following components:   Troponin I (High Sensitivity) 49 (*)    All other components within normal limits  CULTURE, BLOOD (ROUTINE X 2)  CULTURE, BLOOD (ROUTINE X 2)  ETHANOL  VALPROIC ACID LEVEL  LACTIC ACID, PLASMA  PROTIME-INR  APTT    EKG EKG Interpretation  Date/Time:  Thursday June 15 2022 01:00:20 EDT Ventricular Rate:  116 PR Interval:  167 QRS Duration: 143 QT Interval:  341 QTC Calculation: 474 R Axis:   254 Text Interpretation: Sinus tachycardia RBBB and LAFB Nonspecific ST  abnormality Confirmed by Orpah Greek [phone]) on 06/15/2022 1:11:35 AM  Radiology CT HEAD WO CONTRAST (5MM)  Result Date: 06/15/2022 CLINICAL DATA:  Altered mental status and right-sided weakness, initial encounter EXAM: CT HEAD WITHOUT CONTRAST TECHNIQUE: Contiguous axial images were obtained from the base of the skull through the vertex without intravenous contrast. RADIATION DOSE REDUCTION: This exam was  performed according to the departmental dose-optimization program which includes automated exposure control, adjustment of the mA and/or kV according to patient size and/or use of iterative reconstruction technique. COMPARISON:  05/03/2021 FINDINGS: Brain: No evidence of acute infarction, hemorrhage, hydrocephalus, extra-axial collection or mass lesion/mass effect. Chronic atrophic and ischemic changes are noted. Vascular: No hyperdense vessel or unexpected calcification. Skull: Normal. Negative for fracture or focal lesion. Sinuses/Orbits: No acute finding. Other: None. IMPRESSION: Chronic atrophic and ischemic changes without acute abnormality. Electronically Signed   By: Inez Catalina M.D.   On: 06/15/2022 02:20   DG Chest Port 1 View  Result Date: 06/15/2022 CLINICAL DATA:  Mental status change and fever. EXAM: PORTABLE CHEST 1 VIEW COMPARISON:  02/22/2021 FINDINGS: Shallow inspiration with probable linear atelectasis in the lung bases. Heart size and pulmonary vascularity are normal. No focal consolidation in the lungs. No pleural effusions. No pneumothorax. Mediastinal contours appear intact. IMPRESSION: Shallow inspiration with linear atelectasis in the lung bases. No focal consolidation. Electronically Signed   By: Lucienne Capers M.D.   On: 06/15/2022 01:26    Procedures Procedures    Medications Ordered in ED Medications  acetaminophen (TYLENOL) tablet 650 mg (650 mg Oral Given 06/15/22 0115)    ED Course/ Medical Decision Making/ A&P                             Medical  Decision Making Amount and/or Complexity of Data Reviewed Independent Historian: guardian and EMS External Data Reviewed: labs, ECG and notes. Labs: ordered. Decision-making details documented in ED Course. Radiology: ordered and independent interpretation performed. Decision-making details documented in ED Course. ECG/medicine tests: ordered and independent interpretation performed. Decision-making details documented in ED Course.  Risk OTC drugs. Decision regarding hospitalization.   Differential diagnosis considered includes, but not limited to: TIA; Stroke; ICH; Seizure; electrolyte abnormality; toxic/pharmacologic causes; CNS infection; psychiatric disorder; acute delirium; sepsis  Patient brought to the emergency department from home.  Family checked on him tonight and he has been incontinent of urine, was more acutely confused, could not get up off the bed on his own.  EMS report that the patient has been slumped over to the right side and he mentioned to them that he felt like he was weak on the right side.  Patient does have a known history of seizure.  He was by himself, unclear if he had a seizure prior to family checking on him.  Arrival to the ER, however, his neuroexam is nonfocal.  Patient does not have any unilateral weakness or sensory deficit.  Patient noted to be tachycardic at arrival.  This was explained by his fever.  Sepsis workup was performed.  Initial lactic acid was 2.0, repeat was 1.4.  He does not have a leukocytosis.  Chest x-ray without evidence of pneumonia.  Urinalysis without evidence of infection.  He does not have any evidence of bacterial infection, however, did test positive for COVID which would explain his fever and symptoms.  No hypoxia or respiratory distress.  Family reports that when he had COVID previously he had a similar presentation including severe weakness, ability to ambulate.  Do not feel that he would be strong enough to go  home.        Final Clinical Impression(s) / ED Diagnoses Final diagnoses:  Delirium  COVID-19    Rx / DC Orders ED Discharge Orders     None  Orpah Greek, MD 06/15/22 [phone]

## 2022-06-15 NOTE — Progress Notes (Signed)
Due to heavy patient order volume, EEG will be done on day shift.

## 2022-06-15 NOTE — H&P (Signed)
History and Physical    Tyrone Noble [id number] DOB: [birth year] DOA: 06/15/2022  PCP: Lujean Amel, MD  Patient coming from: home  I have personally briefly reviewed patient's old medical records in [place]  Chief Complaint: change in mental status   HPI: Tyrone Noble is a 72 y.o. male with medical history significant of  hypertension, hyperlipidemia, coronary artery disease ,depression, CVA, SZ d/o,Parkinson's disease , Dementia with behavioral issues  who presents to ED BIB EMS after family noted patient was more confused/ and agitated from baseline. IN the field there was some concern for possible sz as family noted patient was incontinent of urine. There was also concern for a possible cva  as well. Of note patient in able to assist with history. History is taken from chart and family accounts.  ROS patient notes not pain no n/v/d further ros  unable to be performed at this time due to patient lethargy.  ED Course:  Temp 101.1 bp 145/70, hr 118, rr 20 sat 100%  EKG_snus tachycardia  RBBB/LAFB non specific st abn Wbc 6.8, hgb 13.3,  plt 160 ETOH <10 Valproic acid 77 Na 135, k 3.8, bicarb 21, cr 1.16 ast 70 Trop 34,49 Cxr: Shallow inspiration with linear atelectasis in the lung bases. No focal consolidation. CTH Chronic atrophic and ischemic changes without acute abnormality.  respiratory panel +covid UA , neg Lact 2 Tx tylenol  Review of Systems: As per HPI otherwise 10 point review of systems negative.   Past Medical History:  Diagnosis Date   Coronary artery disease    Dementia ([place])    Depression    Heart disease    Hypertension    Memory loss    Seizures ([place])    Stroke ([place])    "years ago" (02/16/2015)    Past Surgical History:  Procedure Laterality Date   CARDIAC CATHETERIZATION N/A 02/16/2015   Procedure: Left Heart Cath and Coronary Angiography;  Surgeon: Charolette Forward, MD;  Location: [facility];  Service: Cardiovascular;   Laterality: N/A;   CARDIAC CATHETERIZATION N/A 02/16/2015   Procedure: Coronary Stent Intervention;  Surgeon: Charolette Forward, MD;  Location: [facility];  Service: Cardiovascular;  Laterality: N/A;   CARDIAC CATHETERIZATION N/A 02/16/2015   Procedure: Intravascular Pressure Wire/FFR Study;  Surgeon: Charolette Forward, MD;  Location: [facility];  Service: Cardiovascular;  Laterality: N/A;   CORONARY ANGIOPLASTY     LEFT HEART CATH AND CORONARY ANGIOGRAPHY N/A 03/29/2017   Procedure: LEFT HEART CATH AND CORONARY ANGIOGRAPHY;  Surgeon: Charolette Forward, MD;  Location: [facility];  Service: Cardiovascular;  Laterality: N/A;   LEFT HEART CATHETERIZATION WITH CORONARY ANGIOGRAM N/A 03/19/2012   Procedure: LEFT HEART CATHETERIZATION WITH CORONARY ANGIOGRAM;  Surgeon: Clent Demark, MD;  Location: [facility];  Service: Cardiovascular;  Laterality: N/A;   WISDOM TOOTH EXTRACTION       reports that he has quit smoking. He has never used smokeless tobacco. He reports that he does not currently use alcohol. He reports that he does not use drugs.  Allergies  Allergen Reactions   Aricept [Donepezil Hcl] Nausea Only   Penicillins Nausea And Vomiting    Has patient had a PCN reaction causing immediate rash, facial/tongue/throat swelling, SOB or lightheadedness with hypotension: No  Has patient had a PCN reaction causing severe rash involving mucus membranes or skin necrosis: No  Has patient had a PCN reaction that required hospitalization: No  Has patient had a  PCN reaction occurring within the last 10 years: No  If all of the above answers are "NO", then may proceed with Cephalosporin use.    Family History  Problem Relation Age of Onset   Diabetes Mother    Hypertension Mother    Stroke Father    Cancer Sister     Prior to Admission medications   Medication Sig Start Date End Date Taking? Authorizing Provider  aspirin 81 MG chewable tablet Chew 1 tablet (81 mg total) by mouth  daily. 02/17/15  Yes Charolette Forward, MD  atorvastatin (LIPITOR) 20 MG tablet Take 20 mg by mouth daily.  05/08/17  Yes [provider]  carbidopa-levodopa (SINEMET IR) 25-250 MG tablet Take 1 tablet by mouth 4 (four) times daily. 05/09/22  Yes Suzzanne Cloud, NP  divalproex (DEPAKOTE ER) 500 MG 24 hr tablet Take 2 tablets (1,000 mg total) by mouth at bedtime. 05/09/22  Yes Suzzanne Cloud, NP  levETIRAcetam (KEPPRA) 750 MG tablet Take 1 tablet (750 mg total) by mouth 2 (two) times daily. 05/09/22  Yes Suzzanne Cloud, NP  losartan (COZAAR) 100 MG tablet Take 50 mg by mouth in the morning. 02/21/21  Yes [provider]  metoprolol succinate (TOPROL-XL) 25 MG 24 hr tablet Take 25 mg by mouth daily. 11/19/15  Yes [provider]  Multiple Vitamins-Minerals (CENTRUM SILVER PO) Take 1 tablet by mouth daily.   Yes [provider]  risperiDONE (RISPERDAL) 1 MG tablet Take 1 tablet (1 mg total) by mouth at bedtime. 05/09/22  Yes Suzzanne Cloud, NP    Physical Exam: Vitals:   06/15/22 0200 06/15/22 0300 06/15/22 0400 06/15/22 0430  BP: 125/75 137/75 (!) 145/79   Pulse: (!) 103 93 94 87  Resp: 20 18 17 17   Temp:    99.9 F (37.7 C)  TempSrc:    Oral  SpO2: 100% 100% 100% 100%    Constitutional: NAD, calm, comfortable Vitals:   06/15/22 0200 06/15/22 0300 06/15/22 0400 06/15/22 0430  BP: 125/75 137/75 (!) 145/79   Pulse: (!) 103 93 94 87  Resp: 20 18 17 17   Temp:    99.9 F (37.7 C)  TempSrc:    Oral  SpO2: 100% 100% 100% 100%   Eyes: PERRL, lids and conjunctivae normal ENMT: Mucous membranes are dry. Posterior pharynx clear of any exudate or lesions.Normal dentition.  Neck: normal, supple, no masses, no thyromegaly Respiratory: clear to auscultation bilaterally, no wheezing, no crackles. Normal respiratory effort. No accessory muscle use.  Cardiovascular: Regular rate and rhythm, no murmurs / rubs / gallops. No extremity edema. 2+ pedal pulses.  Abdomen: no  tenderness, no masses palpated. No hepatosplenomegaly. Bowel sounds positive.  Musculoskeletal: no clubbing / cyanosis. No joint deformity upper and lower extremities. Good ROM, no contractures. Normal muscle tone.  Skin: no rashes, lesions, ulcers. No induration Neurologic: CN 2-12 grossly intact. Sensation intact,   Strength 5/5 in all 4.  Psychiatric: t. Alert and oriented x 2. Normal mood. Not agitated currently   Labs on Admission: I have personally reviewed following labs and imaging studies  CBC: Recent Labs  Lab 06/15/22 0117  WBC 6.8  NEUTROABS 5.2  HGB 13.3  HCT 38.7*  MCV 88.6  PLT 0000000   Basic Metabolic Panel: Recent Labs  Lab 06/15/22 0117  NA 135  K 3.8  CL 104  CO2 21*  GLUCOSE 131*  BUN 12  CREATININE 1.16  CALCIUM 8.6*   GFR: CrCl cannot be calculated (  Unknown ideal weight.). Liver Function Tests: Recent Labs  Lab 06/15/22 0117  AST 70*  ALT 9  ALKPHOS 83  BILITOT 1.0  PROT 6.2*  ALBUMIN 3.3*   No results for input(s): "LIPASE", "AMYLASE" in the last 168 hours. No results for input(s): "AMMONIA" in the last 168 hours. Coagulation Profile: Recent Labs  Lab 06/15/22 0117  INR 1.1   Cardiac Enzymes: No results for input(s): "CKTOTAL", "CKMB", "CKMBINDEX", "TROPONINI" in the last 168 hours. BNP (last 3 results) No results for input(s): "PROBNP" in the last 8760 hours. HbA1C: No results for input(s): "HGBA1C" in the last 72 hours. CBG: No results for input(s): "GLUCAP" in the last 168 hours. Lipid Profile: No results for input(s): "CHOL", "HDL", "LDLCALC", "TRIG", "CHOLHDL", "LDLDIRECT" in the last 72 hours. Thyroid Function Tests: No results for input(s): "TSH", "T4TOTAL", "FREET4", "T3FREE", "THYROIDAB" in the last 72 hours. Anemia Panel: No results for input(s): "VITAMINB12", "FOLATE", "FERRITIN", "TIBC", "IRON", "RETICCTPCT" in the last 72 hours. Urine analysis:    Component Value Date/Time   COLORURINE YELLOW 06/15/2022 [place] 06/15/2022 0355   LABSPEC 1.009 06/15/2022 0355   PHURINE 6.0 06/15/2022 0355   GLUCOSEU NEGATIVE 06/15/2022 0355   HGBUR NEGATIVE 06/15/2022 0355   BILIRUBINUR NEGATIVE 06/15/2022 0355   KETONESUR 5 (A) 06/15/2022 0355   PROTEINUR NEGATIVE 06/15/2022 0355   UROBILINOGEN 1.0 03/17/2012 1615   NITRITE NEGATIVE 06/15/2022 0355   LEUKOCYTESUR NEGATIVE 06/15/2022 0355    Radiological Exams on Admission: CT HEAD WO CONTRAST (5MM)  Result Date: 06/15/2022 CLINICAL DATA:  Altered mental status and right-sided weakness, initial encounter EXAM: CT HEAD WITHOUT CONTRAST TECHNIQUE: Contiguous axial images were obtained from the base of the skull through the vertex without intravenous contrast. RADIATION DOSE REDUCTION: This exam was performed according to the departmental dose-optimization program which includes automated exposure control, adjustment of the mA and/or kV according to patient size and/or use of iterative reconstruction technique. COMPARISON:  05/03/2021 FINDINGS: Brain: No evidence of acute infarction, hemorrhage, hydrocephalus, extra-axial collection or mass lesion/mass effect. Chronic atrophic and ischemic changes are noted. Vascular: No hyperdense vessel or unexpected calcification. Skull: Normal. Negative for fracture or focal lesion. Sinuses/Orbits: No acute finding. Other: None. IMPRESSION: Chronic atrophic and ischemic changes without acute abnormality. Electronically Signed   By: Inez Catalina M.D.   On: 06/15/2022 02:20   DG Chest Port 1 View  Result Date: 06/15/2022 CLINICAL DATA:  Mental status change and fever. EXAM: PORTABLE CHEST 1 VIEW COMPARISON:  02/22/2021 FINDINGS: Shallow inspiration with probable linear atelectasis in the lung bases. Heart size and pulmonary vascularity are normal. No focal consolidation in the lungs. No pleural effusions. No pneumothorax. Mediastinal contours appear intact. IMPRESSION: Shallow inspiration with linear atelectasis in the  lung bases. No focal consolidation. Electronically Signed   By: Lucienne Capers M.D.   On: 06/15/2022 01:26    EKG: Independently reviewed. See above  Assessment/Plan  COVID -19 infection with Acute metabolic encephalopathy  -no noted hypoxemia -per family patient has similar behavior /symptoms with prior COVID infection ( confusion/weakness) - plaxovid   per protocol  --encourage po fluid intake  -daily monitoring of COVID labs  --dvt ppx per protocol    -monitor on continuous pulse ox   Hypertension  -continue on metoprolol , losartan  SZ d/o  - unclear if patient also has sz and current symptoms also associated with post ictal period -eeg to be complete  -sz precautions  -continue on home keppra dosing  Hyperlipidemia -continue on atorvastatin   Coronary artery disease -continue on metoprolol ,asa, statin   Depression Dementia with behavioral changes -on depakote /risperidone  CVA -continue secondary ppx wth asa    Parkinson's disease  -continue on sinemet    DVT prophylaxis: Heparin sq   Code Status:FULL Family Communication:   Kandace Blitz (Daughter) [phone] (Mobile)   Disposition Plan: patient  expected to be admitted greater than 2 midnights  Consults called: n/a Admission status: med tele   Clance Boll MD [facility]   If 7PM-7AM, please contact night-coverage [email or web address] Password [email or web address]  06/15/2022, 5:14 AM

## 2022-06-15 NOTE — ED Notes (Signed)
Daughter called and updated to patient status and plan of care.

## 2022-06-16 ENCOUNTER — Inpatient Hospital Stay (HOSPITAL_COMMUNITY): Payer: Medicare HMO

## 2022-06-16 DIAGNOSIS — R4182 Altered mental status, unspecified: Secondary | ICD-10-CM

## 2022-06-16 DIAGNOSIS — U071 COVID-19: Secondary | ICD-10-CM | POA: Diagnosis not present

## 2022-06-16 LAB — COMPREHENSIVE METABOLIC PANEL
ALT: 39 U/L (ref 0–44)
AST: 47 U/L — ABNORMAL HIGH (ref 15–41)
Albumin: 3.4 g/dL — ABNORMAL LOW (ref 3.5–5.0)
Alkaline Phosphatase: 62 U/L (ref 38–126)
Anion gap: 12 (ref 5–15)
BUN: 14 mg/dL (ref 8–23)
CO2: 22 mmol/L (ref 22–32)
Calcium: 8.8 mg/dL — ABNORMAL LOW (ref 8.9–10.3)
Chloride: 100 mmol/L (ref 98–111)
Creatinine, Ser: 1.16 mg/dL (ref 0.61–1.24)
GFR, Estimated: >60 mL/min (ref >60)
Glucose, Bld: 79 mg/dL (ref 70–99)
Potassium: 4.1 mmol/L (ref 3.5–5.1)
Sodium: 134 mmol/L — ABNORMAL LOW (ref 135–145)
Total Bilirubin: 0.7 mg/dL (ref 0.3–1.2)
Total Protein: 6.7 g/dL (ref 6.5–8.1)

## 2022-06-16 LAB — CBC
HCT: 42.5 % (ref 39.0–52.0)
Hemoglobin: 14.1 g/dL (ref 13.0–17.0)
MCH: 29.7 pg (ref 26.0–34.0)
MCHC: 33.2 g/dL (ref 30.0–36.0)
MCV: 89.7 fL (ref 80.0–100.0)
Platelets: 171 10*3/uL (ref 150–400)
RBC: 4.74 MIL/uL (ref 4.22–5.81)
RDW: 13.7 % (ref 11.5–15.5)
WBC: 8.8 10*3/uL (ref 4.0–10.5)
nRBC: 0 % (ref 0.0–0.2)

## 2022-06-16 NOTE — Plan of Care (Signed)

## 2022-06-16 NOTE — Procedures (Signed)
Patient Name: Tyrone Noble  MRN: [id number]  Epilepsy Attending: Lora Havens  Referring Physician/Provider: Clance Boll, MD  Date: 06/16/2022 Duration: 23.49 mins  Patient history: 72yo M with ams. EEG to evaluate for seizure.  Level of alertness: Awake  AEDs during EEG study: LEV, VPA  Technical aspects: This EEG study was done with scalp electrodes positioned according to the 10-20 International system of electrode placement. Electrical activity was reviewed with band pass filter of 1-70Hz , sensitivity of 7 uV/mm, display speed of 27mm/sec with a 60Hz  notched filter applied as appropriate. EEG data were recorded continuously and digitally stored.  Video monitoring was available and reviewed as appropriate.  Description: The posterior dominant rhythm consists of 7 Hz activity of moderate voltage (25-35 uV) seen predominantly in posterior head regions, symmetric and reactive to eye opening and eye closing. EEG showed continuous generalized 5 to 7 Hz theta slowing. Hyperventilation and photic stimulation were not performed.     ABNORMALITY - Continuous slow, generalized  IMPRESSION: This study is suggestive of moderate diffuse encephalopathy, nonspecific etiology. No seizures or epileptiform discharges were seen throughout the recording.  Lorie Melichar Barbra Sarks

## 2022-06-16 NOTE — Evaluation (Signed)
Physical Therapy Evaluation Patient Details Name: Tyrone Noble MRN: [id number] DOB: 1950/07/26 Today's Date: 06/16/2022  History of Present Illness  72 y.o. male presents to [facility] hospital on [facility]/15/2022 with AMS and agitation. Concern for seizure vs CVA in the field. Pt tested positive for COVID-19. PMH includes HTN, HLD, CAD, depression, CVA, Parkinson's disease, dementia.  Clinical Impression  Pt presents to PT with deficits in gait, balance, cognition, endurance, strength. Pt is able to ambulate at this time but reports weakness and experiences 2 posterior losses of balance during session. Pt will benefit from aggressive mobilization in an effort to improve balance an to reduce falls risk. PT recommends discharge home with HHPT and continued assistance from family when medically appropriate.       Recommendations for follow up therapy are one component of a multi-disciplinary discharge planning process, led by the attending physician.  Recommendations may be updated based on patient status, additional functional criteria and insurance authorization.  Follow Up Recommendations       Assistance Recommended at Discharge Frequent or constant Supervision/Assistance  Patient can return home with the following  A little help with walking and/or transfers;A little help with bathing/dressing/bathroom;Assistance with cooking/housework;Direct supervision/assist for medications management;Direct supervision/assist for financial management;Assist for transportation;Help with stairs or ramp for entrance    Equipment Recommendations  (TBD, hopeful for progression to no needs)  Recommendations for Other Services       Functional Status Assessment Patient has had a recent decline in their functional status and demonstrates the ability to make significant improvements in function in a reasonable and predictable amount of time.     Precautions / Restrictions Precautions Precautions: Fall Precaution  Comments: dementia, parkinson's disease Restrictions Weight Bearing Restrictions: No      Mobility  Bed Mobility Overal bed mobility: Modified Independent                  Transfers Overall transfer level: Needs assistance Equipment used: None Transfers: Sit to/from Stand Sit to Stand: Min guard                Ambulation/Gait Ambulation/Gait assistance: Min assist Gait Distance (Feet): 60 Feet Assistive device: None Gait Pattern/deviations: Step-to pattern, Shuffle       General Gait Details: pt with 2 posterior losses of balance, requiring minA to correct initial loss of balance and correcting 2nd with stepping strategy  Stairs            Wheelchair Mobility    Modified Rankin (Stroke Patients Only)       Balance Overall balance assessment: Needs assistance Sitting-balance support: No upper extremity supported, Feet supported Sitting balance-Leahy Scale: Good     Standing balance support: No upper extremity supported, During functional activity Standing balance-Leahy Scale: Fair                               Pertinent Vitals/Pain Pain Assessment Pain Assessment: No/denies pain    Home Living Family/patient expects to be discharged to:: Private residence Living Arrangements: Children Available Help at Discharge: Family;Available 24 hours/day (family is with patient at all times when home, pt goes to [place] during the day) Type of Home: House Home Access: Stairs to enter Entrance Stairs-Rails: None Entrance Stairs-Number of Steps: 1   Home Layout: One level Home Equipment: None Additional Comments: daughter provides history via phone. Pt reported he lived alone and that his Son-in-law checked in on him  Prior Function Prior Level of Function : Independent/Modified Independent             Mobility Comments: supervision at all times due to cognitive deficits but mobilizes without physical  assistance       Hand Dominance        Extremity/Trunk Assessment   Upper Extremity Assessment Upper Extremity Assessment: Overall WFL for tasks assessed    Lower Extremity Assessment Lower Extremity Assessment: Generalized weakness    Cervical / Trunk Assessment Cervical / Trunk Assessment: Normal  Communication   Communication: No difficulties  Cognition Arousal/Alertness: Awake/alert Behavior During Therapy: Flat affect Overall Cognitive Status: History of cognitive impairments - at baseline                                 General Comments: pt with dementia at baseline, follows commands well, poor memory, is oriented to person and place        General Comments General comments (skin integrity, edema, etc.): VSS on RA    Exercises     Assessment/Plan    PT Assessment Patient needs continued PT services  PT Problem List Decreased strength;Decreased activity tolerance;Decreased balance;Decreased mobility;Decreased cognition;Decreased knowledge of use of DME;Decreased safety awareness;Decreased knowledge of precautions;Cardiopulmonary status limiting activity       PT Treatment Interventions DME instruction;Stair training;Gait training;Functional mobility training;Therapeutic activities;Therapeutic exercise;Balance training;Neuromuscular re-education;Cognitive remediation;Patient/family education    PT Goals (Current goals can be found in the Care Plan section)  Acute Rehab PT Goals Patient Stated Goal: to return home PT Goal Formulation: With patient Time For Goal Achievement: 06/30/22 Potential to Achieve Goals: Fair Additional Goals Additional Goal #1: Pt will score >19/24 on the DGI to indicate a reduced risk for falls    Frequency Min 3X/week     Co-evaluation               AM-PAC PT "6 Clicks" Mobility  Outcome Measure Help needed turning from your back to your side while in a flat bed without using bedrails?: None Help needed  moving from lying on your back to sitting on the side of a flat bed without using bedrails?: None Help needed moving to and from a bed to a chair (including a wheelchair)?: A Little Help needed standing up from a chair using your arms (e.g., wheelchair or bedside chair)?: A Little Help needed to walk in hospital room?: A Little Help needed climbing 3-5 steps with a railing? : A Lot 6 Click Score: 19    End of Session   Activity Tolerance: Patient tolerated treatment well Patient left: in bed;with call bell/phone within reach;with bed alarm set Nurse Communication: Mobility status PT Visit Diagnosis: Other abnormalities of gait and mobility (R26.89);Muscle weakness (generalized) (M62.81)    Time: [id number] PT Time Calculation (min) (ACUTE ONLY): 18 min   Charges:   PT Evaluation $PT Eval Low Complexity: 1 Low          Zenaida Niece, PT, DPT Acute Rehabilitation Office [phone]   Zenaida Niece 06/16/2022, 4:39 PM

## 2022-06-16 NOTE — Progress Notes (Signed)
EEG complete - results pending 

## 2022-06-16 NOTE — Progress Notes (Addendum)
PROGRESS NOTE  Tyrone Noble [id number] DOB: 01-22-1951 DOA: 06/15/2022 PCP: Lujean Amel, MD  HPI/Recap of past 39 hours: 72 year old male with past medical history significant for hypertension, hyperlipidemia, coronary renal disease, chronic anxiety/depression, CVA, seizure disorder, Parkinson's disease, dementia, who presented to ED with confusion and agitation from his baseline.  Workup in the ED reveals COVID-19 viral infection with acute metabolic encephalopathy.  Not hypoxic.  Started on Paxlovid.  06/16/22: The patient was seen and examined at his bedside.  Somnolent but easily arousable to voices.  Weak appearing.  PT consulted.  Assessment/Plan: Principal Problem:   COVID-19  COVID -19 infection with Acute metabolic encephalopathy  Not hypoxic. Reorient as needed. Continue Paxlovid and DVT prophylaxis Continue to monitor O2 saturation   Hypertension  -continue on metoprolol , losartan Closely monitor vital signs   Seizure disorder Resume home regimen Continue seizure precautions.   Hyperlipidemia Continue home atorvastatin    Coronary artery disease Denies any anginal symptoms -continue on metoprolol ,asa, statin    Depression Dementia with behavioral changes -on depakote /risperidone   CVA -continue secondary ppx wth asa     Parkinson's disease  -continue on sinemet   Physical debility PT assessment Fall precautions       DVT prophylaxis: Heparin sq 3 times daily            Code Status:FULL Family Communication:   Kandace Blitz (Daughter) [phone] (Mobile)    Disposition Plan: patient  expected to be admitted greater than 2 midnights  Consults called: n/a Admission status: med tele      Code Status: Full code  Family Communication: None at bedside  Disposition Plan: Likely will discharge to home in the next 48 hours.   Consultants: None none  Procedures:    Status is: Inpatient The patient requires at least 2  midnights for further evaluation and treatment of present condition.    Objective: Vitals:   06/16/22 1016 06/16/22 1018 06/16/22 1200 06/16/22 1608  BP: 139/85  (!) 140/87 (!) 140/83  Pulse:  70 65 68  Resp:      Temp:   (!) 97.5 F (36.4 C) (!) 97.2 F (36.2 C)  TempSrc:   Oral Axillary  SpO2:  97% 98% 99%  Weight:        Intake/Output Summary (Last 24 hours) at 06/16/2022 1746 Last data filed at 06/15/2022 1800 Gross per 24 hour  Intake --  Output 800 ml  Net -800 ml   Filed Weights   06/16/22 0500  Weight: 74 kg    Exam:  General: 72 y.o. year-old male frail-appearing no acute distress.  He is somnolent but easily arousable to voices Cardiovascular: Regular rate and rhythm no rubs or gallops.  No thyromegaly or JVD noted.   Respiratory: Clear to auscultation no wheezes or rales. Abdomen: Soft nontender nondistended with normal bowel sounds x4 quadrants. Musculoskeletal: No lower extremity edema.   Skin: No ulcerative lesions noted or rashes, Psychiatry: Mood is appropriate.   Data Reviewed: CBC: Recent Labs  Lab 06/15/22 0117 06/15/22 0655 06/16/22 0642  WBC 6.8 6.9 8.8  NEUTROABS 5.2  --   --   HGB 13.3 13.2 14.1  HCT 38.7* 39.3 42.5  MCV 88.6 89.7 89.7  PLT 160 173 [id number]   Basic Metabolic Panel: Recent Labs  Lab 06/15/22 0117 06/15/22 0655 06/16/22 0642  NA 135  --  134*  K 3.8  --  4.1  CL 104  --  100  CO2 21*  --  22  GLUCOSE 131*  --  79  BUN 12  --  14  CREATININE 1.16 1.13 1.16  CALCIUM 8.6*  --  8.8*  MG  --  1.9  --   PHOS  --  3.6  --    GFR: Estimated Creatinine Clearance: 51.2 mL/min (by C-G formula based on SCr of 1.16 mg/dL). Liver Function Tests: Recent Labs  Lab 06/15/22 0117 06/16/22 0642  AST 70* 47*  ALT 9 39  ALKPHOS 83 62  BILITOT 1.0 0.7  PROT 6.2* 6.7  ALBUMIN 3.3* 3.4*   No results for input(s): "LIPASE", "AMYLASE" in the last 168 hours. No results for input(s): "AMMONIA" in the last 168  hours. Coagulation Profile: Recent Labs  Lab 06/15/22 0117  INR 1.1   Cardiac Enzymes: No results for input(s): "CKTOTAL", "CKMB", "CKMBINDEX", "TROPONINI" in the last 168 hours. BNP (last 3 results) No results for input(s): "PROBNP" in the last 8760 hours. HbA1C: No results for input(s): "HGBA1C" in the last 72 hours. CBG: No results for input(s): "GLUCAP" in the last 168 hours. Lipid Profile: No results for input(s): "CHOL", "HDL", "LDLCALC", "TRIG", "CHOLHDL", "LDLDIRECT" in the last 72 hours. Thyroid Function Tests: Recent Labs    06/15/22 0655  TSH 3.153   Anemia Panel: No results for input(s): "VITAMINB12", "FOLATE", "FERRITIN", "TIBC", "IRON", "RETICCTPCT" in the last 72 hours. Urine analysis:    Component Value Date/Time   COLORURINE YELLOW 06/15/2022 [place] 06/15/2022 0355   LABSPEC 1.009 06/15/2022 0355   PHURINE 6.0 06/15/2022 0355   GLUCOSEU NEGATIVE 06/15/2022 0355   HGBUR NEGATIVE 06/15/2022 0355   BILIRUBINUR NEGATIVE 06/15/2022 0355   KETONESUR 5 (A) 06/15/2022 0355   PROTEINUR NEGATIVE 06/15/2022 0355   UROBILINOGEN 1.0 03/17/2012 1615   NITRITE NEGATIVE 06/15/2022 0355   LEUKOCYTESUR NEGATIVE 06/15/2022 0355   Sepsis Labs: @LABRCNTIP (procalcitonin:4,lacticidven:4)  ) Recent Results (from the past 240 hour(s))  Blood Culture (routine x 2)     Status: None (Preliminary result)   Collection Time: 06/15/22  1:17 AM   Specimen: BLOOD RIGHT FOREARM  Result Value Ref Range Status   Specimen Description BLOOD RIGHT FOREARM  Final   Special Requests   Final    BOTTLES DRAWN AEROBIC AND ANAEROBIC Blood Culture results may not be optimal due to an excessive volume of blood received in culture bottles   Culture   Final    NO GROWTH 1 DAY Performed at [facility], [place] [address], [place], [place] [address]    Report Status PENDING  Incomplete  Blood Culture (routine x 2)     Status: None (Preliminary result)   Collection Time:  06/15/22  1:40 AM   Specimen: BLOOD  Result Value Ref Range Status   Specimen Description BLOOD LEFT ANTECUBITAL  Final   Special Requests   Final    BOTTLES DRAWN AEROBIC AND ANAEROBIC Blood Culture results may not be optimal due to an excessive volume of blood received in culture bottles   Culture   Final    NO GROWTH 1 DAY Performed at [facility], [place] [address], [place], [place] [address]    Report Status PENDING  Incomplete  SARS Coronavirus 2 by RT PCR (hospital order, performed in [facility] hospital lab) *cepheid single result test* Anterior Nasal Swab     Status: Abnormal   Collection Time: 06/15/22  3:02 AM   Specimen: Anterior Nasal Swab  Result Value Ref Range Status   SARS Coronavirus  2 by RT PCR POSITIVE (A) NEGATIVE Final    Comment: Performed at [facility], [place] [address] [place], [place] [address]      Studies: EEG adult  Result Date: 07/10/2022 Lora Havens, MD     07-10-22 12:23 PM Patient Name: Tyrone Noble MRN: [id number] Epilepsy Attending: Lora Havens Referring Physician/Provider: Clance Boll, MD Date: 2022/07/10 Duration: 23.49 mins Patient history: 72yo M with ams. EEG to evaluate for seizure. Level of alertness: Awake AEDs during EEG study: LEV, VPA Technical aspects: This EEG study was done with scalp electrodes positioned according to the 10-20 International system of electrode placement. Electrical activity was reviewed with band pass filter of 1-70Hz , sensitivity of 7 uV/mm, display speed of 77mm/sec with a 60Hz  notched filter applied as appropriate. EEG data were recorded continuously and digitally stored.  Video monitoring was available and reviewed as appropriate. Description: The posterior dominant rhythm consists of 7 Hz activity of moderate voltage (25-35 uV) seen predominantly in posterior head regions, symmetric and reactive to eye opening and eye closing. EEG showed continuous generalized 5 to 7 Hz theta slowing.  Hyperventilation and photic stimulation were not performed.   ABNORMALITY - Continuous slow, generalized IMPRESSION: This study is suggestive of moderate diffuse encephalopathy, nonspecific etiology. No seizures or epileptiform discharges were seen throughout the recording. Priyanka Barbra Sarks    Scheduled Meds:  aspirin  81 mg Oral Daily   carbidopa-levodopa  1 tablet Oral QID   divalproex  1,000 mg Oral QHS   heparin  5,000 Units Subcutaneous Q8H   levETIRAcetam  750 mg Oral BID   losartan  50 mg Oral Daily   metoprolol succinate  25 mg Oral Daily   multivitamin with minerals  1 tablet Oral Daily   nirmatrelvir/ritonavir  3 tablet Oral BID   risperiDONE  1 mg Oral QHS    Continuous Infusions:   LOS: 1 day     Kayleen Memos, MD [facility] Pager [phone]  If 7PM-7AM, please contact night-coverage [email or web address] Password [facility] 2022-07-10, 5:46 PM

## 2022-06-17 DIAGNOSIS — U071 COVID-19: Secondary | ICD-10-CM | POA: Diagnosis not present

## 2022-06-17 NOTE — Evaluation (Signed)
Occupational Therapy Evaluation Patient Details Name: Tyrone Noble MRN: [id number] DOB: 1950/07/31 Today's Date: 06/17/2022   History of Present Illness 72 y.o. male presents to [facility] hospital on 06/15/2022 with AMS and agitation. Concern for seizure vs CVA in the field. Pt tested positive for COVID-19. PMH includes HTN, HLD, CAD, depression, CVA, Parkinson's disease, dementia.   Clinical Impression   PTA, pt lived with family who provided 24/7 supervision and pt went to [employer] day program 5 days per week. Upon eval, pt with decreased cognition, safety, awareness, orientation, and strength. Pt performing ADL with up to min guard on eval and requiring frequent cues for problem solving and intermittently for sequencing of ADL. Will continue to follow acutely and recommending continued OT in natural environment to optimize safety during ADL.      Recommendations for follow up therapy are one component of a multi-disciplinary discharge planning process, led by the attending physician.  Recommendations may be updated based on patient status, additional functional criteria and insurance authorization.   Assistance Recommended at Discharge Frequent or constant Supervision/Assistance  Patient can return home with the following A little help with walking and/or transfers;A little help with bathing/dressing/bathroom;Help with stairs or ramp for entrance;Direct supervision/assist for financial management;Assist for transportation;Direct supervision/assist for medications management;Assistance with cooking/housework    Functional Status Assessment  Patient has had a recent decline in their functional status and/or demonstrates limited ability to make significant improvements in function in a reasonable and predictable amount of time  Equipment Recommendations  None recommended by OT    Recommendations for Other Services       Precautions / Restrictions Precautions Precautions: Fall Precaution  Comments: dementia, parkinson's disease Restrictions Weight Bearing Restrictions: No      Mobility Bed Mobility Overal bed mobility: Needs Assistance Bed Mobility: Supine to Sit, Sit to Supine     Supine to sit: Supervision Sit to supine: Supervision   General bed mobility comments: min cues for problem solving to bring BLE back into bed    Transfers Overall transfer level: Needs assistance Equipment used: None Transfers: Sit to/from Stand Sit to Stand: Min guard           General transfer comment: for safety, slow but steady rise      Balance Overall balance assessment: Needs assistance Sitting-balance support: No upper extremity supported, Feet supported Sitting balance-Leahy Scale: Good     Standing balance support: No upper extremity supported, During functional activity Standing balance-Leahy Scale: Fair                             ADL either performed or assessed with clinical judgement   ADL Overall ADL's : Needs assistance/impaired Eating/Feeding: Set up   Grooming: Min guard;Standing;Cueing for sequencing   Upper Body Bathing: Sitting;Set up;Cueing for sequencing   Lower Body Bathing: Min guard;Cueing for sequencing;Sitting/lateral leans   Upper Body Dressing : Set up;Cueing for safety;Sitting;Supervision/safety   Lower Body Dressing: Min guard;Sit to/from stand;Cueing for sequencing;Cueing for compensatory techniques Lower Body Dressing Details (indicate cue type and reason): difficulty doffing/donning socks; asking for help and able to perform with cues for different technique. Poor carryover when attempting second foot again requiring cues Toilet Transfer: Min guard;Ambulation;Regular Glass blower/designer Details (indicate cue type and reason): one instance of festinating gait followed by complete pause in movement when attempting to turn out of the restroom. Cues facilitated return to movement. No hands on assist required [place]  Manipulation and Hygiene: Min guard;Sit to/from stand Toileting - Clothing Manipulation Details (indicate cue type and reason): to urinate at toilet     Functional mobility during ADLs: Min guard General ADL Comments: frequent cueing during ADL and mobility     Vision Patient Visual Report: No change from baseline Additional Comments: Pt denies visual changes. Able to read "occupational therapy" on name tag.     Perception     Praxis      Pertinent Vitals/Pain Pain Assessment Pain Assessment: No/denies pain     Hand Dominance Right   Extremity/Trunk Assessment Upper Extremity Assessment Upper Extremity Assessment: Generalized weakness   Lower Extremity Assessment Lower Extremity Assessment: Defer to PT evaluation   Cervical / Trunk Assessment Cervical / Trunk Assessment: Normal   Communication Communication Communication: No difficulties   Cognition Arousal/Alertness: Awake/alert Behavior During Therapy: Flat affect Overall Cognitive Status: History of cognitive impairments - at baseline                                 General Comments: pt with dementia at baseline, follows one step commands well, poor memory, is oriented to person and place. Attempting errorless learning "push the red button to get in touch with your nurse, but pt Unable to tell OT how to utilize call bell or identify when asked "which button do you push to get in touch with your nurse". Attempted 4x and pt with correct answer twice. RN, however, reporting that pt has been able to utilize call bell when he has had needs today. Pt with difficutly doffing socks due to poor problem solving, but good command following to attempt different method; poor carryover, as required the same cues for second LE     General Comments  VSS on RA. HR max of 108    Exercises     Shoulder Instructions      Home Living Family/patient expects to be discharged to:: Private residence Living  Arrangements: Children Available Help at Discharge: Family;Available 24 hours/day (family is with patient at all times when home, pt goes to [place] during the day) Type of Home: House Home Access: Stairs to enter [employer] of Steps: 1 Entrance Stairs-Rails: None Home Layout: One level     Bathroom Shower/Tub: Tub/shower unit         Home Equipment: None   Additional Comments: Information gleaned from PT chart review in which PT states "daughter provides history via phone. Pt reported he lived alone and that his Son-in-law checked in on him"      Prior Functioning/Environment Prior Level of Function : Independent/Modified Independent             Mobility Comments: supervision at all times due to cognitive deficits but mobilizes without physical assistance ADLs Comments: supervision at all times        OT Problem List: Decreased strength;Decreased activity tolerance;Impaired balance (sitting and/or standing);Decreased cognition;Decreased safety awareness;Decreased knowledge of use of DME or AE      OT Treatment/Interventions: Self-care/ADL training;Therapeutic exercise;DME and/or AE instruction;Patient/family education;Therapeutic activities;Cognitive remediation/compensation    OT Goals(Current goals can be found in the care plan section) Acute Rehab OT Goals Patient Stated Goal: go to the bathroom OT Goal Formulation: With patient Time For Goal Achievement: 07/01/22 Potential to Achieve Goals: Good  OT Frequency: Min 2X/week    Co-evaluation              AM-PAC OT "6  Clicks" Daily Activity     Outcome Measure Help from another person eating meals?: A Little Help from another person taking care of personal grooming?: A Little Help from another person toileting, which includes using toliet, bedpan, or urinal?: A Little Help from another person bathing (including washing, rinsing, drying)?: A Little Help from another person to put  on and taking off regular upper body clothing?: A Little Help from another person to put on and taking off regular lower body clothing?: A Little 6 Click Score: 18   End of Session Nurse Communication: Mobility status;Other (comment) (call bell (see cog section))  Activity Tolerance: Patient tolerated treatment well Patient left: in bed;with call bell/phone within reach;with bed alarm set  OT Visit Diagnosis: Unsteadiness on feet (R26.81);Muscle weakness (generalized) (M62.81);Other abnormalities of gait and mobility (R26.89);Other symptoms and signs involving cognitive function                Time: [id number] OT Time Calculation (min): 22 min Charges:  OT General Charges $OT Visit: 1 Visit OT Evaluation $OT Eval Low Complexity: 1 Low  Elder Cyphers, OTR/L [facility] Acute Rehabilitation Office: [phone]   Magnus Ivan 06/17/2022, 5:16 PM

## 2022-06-17 NOTE — Progress Notes (Signed)
Updated the patient's daughter Kandace Blitz via phone all questions answered to the best of my ability.  Hopeful for discharge to home tomorrow.

## 2022-06-17 NOTE — Progress Notes (Signed)
PROGRESS NOTE  Tyrone Noble [id number] DOB: 07/19/1950 DOA: 06/15/2022 PCP: Lujean Amel, MD  HPI/Recap of past 37 hours: 72 year old male with past medical history significant for hypertension, hyperlipidemia, coronary renal disease, chronic anxiety/depression, CVA, seizure disorder, Parkinson's disease, dementia, who presented to ED with confusion and agitation from his baseline.  Workup in the ED reveals COVID-19 viral infection with acute metabolic encephalopathy.  Not hypoxic.  Started on Paxlovid.  Hospital course complicated by poor oral intake.  Drinking fine but not eating much at all.  Feeling assistance initiated.  06/17/2022: The patient was seen and examined at his bedside.  Somnolent but is arousable to voices.  He has no new complaints  Assessment/Plan: Principal Problem:   COVID-19  COVID -19 infection with Acute metabolic encephalopathy  Not hypoxic. Continue to reorient as needed. Continue Paxlovid and DVT prophylaxis Continue to monitor O2 saturation  Moderate protein calorie malnutrition Abdomen 3.4 Moderate muscle mass loss Encourage oral protein calorie intake Feeding assistance initiated.   Hypertension  BP is at goal. -continue on metoprolol , losartan Closely monitor vital signs   Seizure disorder Continue home regimen Continue seizure precautions.   Hyperlipidemia Continue home atorvastatin    Coronary artery disease Denies any anginal symptoms Continue home metoprolol ,asa, statin    Depression Dementia with behavioral changes -on depakote /risperidone   CVA Continue secondary ppx wth asa     Parkinson's disease  Continue on sinemet   Physical debility PT assessment Continue fall precautions       DVT prophylaxis: Heparin sq 3 times daily            Code Status:FULL Family Communication:   Kandace Blitz (Daughter) [phone] (Mobile)    Disposition Plan: patient  expected to be admitted greater than 2  midnights  Consults called: n/a Admission status: med tele      Code Status: Full code  Family Communication: None at bedside.  Disposition Plan: Possible discharge within the next 24 to 48 hours.  Encourage patient to eat.   Consultants: None.  Procedures:    Status is: Inpatient The patient requires at least 2 midnights for further evaluation and treatment of present condition.    Objective: Vitals:   06/17/22 0808 06/17/22 0817 06/17/22 1100 06/17/22 1541  BP: 133/81 133/81 111/75 107/86  Pulse: 86   74  Resp:   19 18  Temp:  99.5 F (37.5 C) 99.6 F (37.6 C) 98.1 F (36.7 C)  TempSrc:  Axillary Axillary Oral  SpO2:    97%  Weight:        Intake/Output Summary (Last 24 hours) at 06/17/2022 1547 Last data filed at 06/17/2022 0400 Gross per 24 hour  Intake 360 ml  Output 500 ml  Net -140 ml   Filed Weights   06/16/22 0500 06/17/22 0500  Weight: 74 kg 74.5 kg    Exam:  General: 72 y.o. year-old male frail-appearing no acute distress.  Cardiovascular: Regular rate and rhythm Respiratory: Clear to Auscultation No Wheezes or Rales. Abdomen: Soft nontender bowel sounds present. Musculoskeletal: No lower extremity edema.   Skin: No ulcerative lesions noted or rashes, Psychiatry: Mood is appropriate   Data Reviewed: CBC: Recent Labs  Lab 06/15/22 0117 06/15/22 0655 06/16/22 0642  WBC 6.8 6.9 8.8  NEUTROABS 5.2  --   --   HGB 13.3 13.2 14.1  HCT 38.7* 39.3 42.5  MCV 88.6 89.7 89.7  PLT 160 173 [id number]   Basic Metabolic Panel: Recent Labs  Lab 06/15/22  0117 06/15/22 0655 06/16/22 0642  NA 135  --  134*  K 3.8  --  4.1  CL 104  --  100  CO2 21*  --  22  GLUCOSE 131*  --  79  BUN 12  --  14  CREATININE 1.16 1.13 1.16  CALCIUM 8.6*  --  8.8*  MG  --  1.9  --   PHOS  --  3.6  --    GFR: Estimated Creatinine Clearance: 51.2 mL/min (by C-G formula based on SCr of 1.16 mg/dL). Liver Function Tests: Recent Labs  Lab 06/15/22 0117  06/16/22 0642  AST 70* 47*  ALT 9 39  ALKPHOS 83 62  BILITOT 1.0 0.7  PROT 6.2* 6.7  ALBUMIN 3.3* 3.4*   No results for input(s): "LIPASE", "AMYLASE" in the last 168 hours. No results for input(s): "AMMONIA" in the last 168 hours. Coagulation Profile: Recent Labs  Lab 06/15/22 0117  INR 1.1   Cardiac Enzymes: No results for input(s): "CKTOTAL", "CKMB", "CKMBINDEX", "TROPONINI" in the last 168 hours. BNP (last 3 results) No results for input(s): "PROBNP" in the last 8760 hours. HbA1C: No results for input(s): "HGBA1C" in the last 72 hours. CBG: No results for input(s): "GLUCAP" in the last 168 hours. Lipid Profile: No results for input(s): "CHOL", "HDL", "LDLCALC", "TRIG", "CHOLHDL", "LDLDIRECT" in the last 72 hours. Thyroid Function Tests: Recent Labs    06/15/22 0655  TSH 3.153   Anemia Panel: No results for input(s): "VITAMINB12", "FOLATE", "FERRITIN", "TIBC", "IRON", "RETICCTPCT" in the last 72 hours. Urine analysis:    Component Value Date/Time   COLORURINE YELLOW 06/15/2022 [place] 06/15/2022 0355   LABSPEC 1.009 06/15/2022 0355   PHURINE 6.0 06/15/2022 0355   GLUCOSEU NEGATIVE 06/15/2022 0355   HGBUR NEGATIVE 06/15/2022 0355   BILIRUBINUR NEGATIVE 06/15/2022 0355   KETONESUR 5 (A) 06/15/2022 0355   PROTEINUR NEGATIVE 06/15/2022 0355   UROBILINOGEN 1.0 03/17/2012 1615   NITRITE NEGATIVE 06/15/2022 0355   LEUKOCYTESUR NEGATIVE 06/15/2022 0355   Sepsis Labs: @LABRCNTIP (procalcitonin:4,lacticidven:4)  ) Recent Results (from the past 240 hour(s))  Blood Culture (routine x 2)     Status: None (Preliminary result)   Collection Time: 06/15/22  1:17 AM   Specimen: BLOOD RIGHT FOREARM  Result Value Ref Range Status   Specimen Description BLOOD RIGHT FOREARM  Final   Special Requests   Final    BOTTLES DRAWN AEROBIC AND ANAEROBIC Blood Culture results may not be optimal due to an excessive volume of blood received in culture bottles    Culture   Final    NO GROWTH 2 DAYS Performed at [facility], [place] [address] [place], [place] [address]    Report Status PENDING  Incomplete  Blood Culture (routine x 2)     Status: None (Preliminary result)   Collection Time: 06/15/22  1:40 AM   Specimen: BLOOD  Result Value Ref Range Status   Specimen Description BLOOD LEFT ANTECUBITAL  Final   Special Requests   Final    BOTTLES DRAWN AEROBIC AND ANAEROBIC Blood Culture results may not be optimal due to an excessive volume of blood received in culture bottles   Culture   Final    NO GROWTH 2 DAYS Performed at [facility], [place] [address] [place], [place] [address]    Report Status PENDING  Incomplete  SARS Coronavirus 2 by RT PCR (hospital order, performed in [facility] hospital lab) *cepheid single result test* Anterior  Nasal Swab     Status: Abnormal   Collection Time: 06/15/22  3:02 AM   Specimen: Anterior Nasal Swab  Result Value Ref Range Status   SARS Coronavirus 2 by RT PCR POSITIVE (A) NEGATIVE Final    Comment: Performed at [facility], [place] [address] [place],  [address]      Studies: No results found.  Scheduled Meds:  aspirin  81 mg Oral Daily   carbidopa-levodopa  1 tablet Oral QID   divalproex  1,000 mg Oral QHS   heparin  5,000 Units Subcutaneous Q8H   levETIRAcetam  750 mg Oral BID   losartan  50 mg Oral Daily   metoprolol succinate  25 mg Oral Daily   multivitamin with minerals  1 tablet Oral Daily   nirmatrelvir/ritonavir  3 tablet Oral BID   risperiDONE  1 mg Oral QHS    Continuous Infusions:   LOS: 2 days     Kayleen Memos, MD [facility] Pager [phone]  If 7PM-7AM, please contact night-coverage [email or web address] Password [facility] 06/17/2022, 3:47 PM

## 2022-06-18 DIAGNOSIS — U071 COVID-19: Secondary | ICD-10-CM | POA: Diagnosis not present

## 2022-06-18 DIAGNOSIS — R410 Disorientation, unspecified: Secondary | ICD-10-CM | POA: Diagnosis not present

## 2022-06-18 MED ORDER — NIRMATRELVIR/RITONAVIR (PAXLOVID)TABLET: 3.0000 | ORAL_TABLET | Freq: Two times a day (BID) | ORAL | 0 refills | Status: AC

## 2022-06-18 NOTE — Discharge Summary (Signed)
PATIENT DETAILS Name: Tyrone Noble Age: 72 y.o. Sex: male Date of Birth: Aug 05, 1950 MRN: [id number]. Admitting Physician: Clance Boll, MD [id number], [name], MD  Admit Date: 06/15/2022 Discharge date: 06/18/2022  Recommendations for Outpatient Follow-up:  Follow up with PCP in 1-2 weeks Please obtain CMP/CBC in one week  Admitted From:  Home  Disposition: Home health   Discharge Condition: fair  CODE STATUS:   Code Status: Full Code   Diet recommendation:  Diet Order             Diet - low sodium heart healthy           Diet Heart Room service appropriate? Yes; Fluid consistency: Thin  Diet effective now                    Brief Summary: 72 year old male with history of HTN, HLD, seizure disorder, Parkinson disease, dementia who presented with acute metabolic encephalopathy in the setting of COVID-19 infection.  Brief Hospital Course: Acute metabolic encephalopathy superimposed on dementia Etiology of encephalopathy likely due to COVID-19 infection CT head with no acute changes-EEG without any seizures Encephalopathy better-suspect not far from baseline Easily redirectable No major issues overnight per RN Dr. [place] Crane had spoken with family yesterday-plans are for discharge home today  COVID-19 infection No pneumonia on CXR On room air Continue Paxlovid on discharge  Moderate protein calorie malnutrition Supportive care Continue to encourage oral intake/supplements on discharge  HTN BP stable Continue metoprolol/losartan  HLD Statin  CAD No anginal symptoms Continue aspirin/statin/beta-blocker  Seizure disorder Keppra  Parkinson's disease Sinemet  Dementia with behavioral changes Depression Expected to have delirium when acutely ill/hospitalized But relatively stable overnight Depakote/risperidone to be continued on discharge  BMI: Estimated body mass index is 24.41 kg/m as calculated from the following:   Height as of  05/09/22: 5\' 6"  (1.676 m).   Weight as of this encounter: 68.6 kg.    Discharge Diagnoses:  Principal Problem:   COVID-19  Discharge Instructions:  Activity:  As tolerated with Full fall precautions use walker/cane & assistance as needed  Discharge Instructions     Call MD for:  extreme fatigue   Complete by: As directed    Call MD for:  persistant dizziness or light-headedness   Complete by: As directed    Diet - low sodium heart healthy   Complete by: As directed    Discharge instructions   Complete by: As directed    Follow with Primary MD  [name], [name], MD in 1-2 weeks  Please get a complete blood count and chemistry panel checked by your Primary MD at your next visit, and again as instructed by your Primary MD.  Get Medicines reviewed and adjusted: Please take all your medications with you for your next visit with your Primary MD  Laboratory/radiological data: Please request your Primary MD to go over all hospital tests and procedure/radiological results at the follow up, please ask your Primary MD to get all Hospital records sent to his/her office.  In some cases, they will be blood work, cultures and biopsy results pending at the time of your discharge. Please request that your primary care M.D. follows up on these results.  Also Note the following: If you experience worsening of your admission symptoms, develop shortness of breath, life threatening emergency, suicidal or homicidal thoughts you must seek medical attention immediately by calling 911 or calling your MD immediately  if symptoms less severe.  You must read complete instructions/literature  along with all the possible adverse reactions/side effects for all the Medicines you take and that have been prescribed to you. Take any new Medicines after you have completely understood and accpet all the possible adverse reactions/side effects.   Do not drive when taking Pain medications or sleeping medications  (Benzodaizepines)  Do not take more than prescribed Pain, Sleep and Anxiety Medications. It is not advisable to combine anxiety,sleep and pain medications without talking with your primary care practitioner  Special Instructions: If you have smoked or chewed Tobacco  in the last 2 yrs please stop smoking, stop any regular Alcohol  and or any Recreational drug use.  Wear Seat belts while driving.  Please note: You were cared for by a hospitalist during your hospital stay. Once you are discharged, your primary care physician will handle any further medical issues. Please note that NO REFILLS for any discharge medications will be authorized once you are discharged, as it is imperative that you return to your primary care physician (or establish a relationship with a primary care physician if you do not have one) for your post hospital discharge needs so that they can reassess your need for medications and monitor your lab values.   Increase activity slowly   Complete by: As directed       Allergies as of 06/18/2022       Reactions   Aricept [donepezil Hcl] Nausea Only   Penicillins Nausea And Vomiting   Has patient had a PCN reaction causing immediate rash, facial/tongue/throat swelling, SOB or lightheadedness with hypotension: No  Has patient had a PCN reaction causing severe rash involving mucus membranes or skin necrosis: No  Has patient had a PCN reaction that required hospitalization: No  Has patient had a PCN reaction occurring within the last 10 years: No  If all of the above answers are "NO", then may proceed with Cephalosporin use.        Medication List     TAKE these medications    aspirin 81 MG chewable tablet Chew 1 tablet (81 mg total) by mouth daily.   atorvastatin 20 MG tablet Commonly known as: LIPITOR Take 20 mg by mouth daily.   carbidopa-levodopa 25-250 MG tablet Commonly known as: SINEMET IR Take 1 tablet by mouth 4 (four) times daily.   CENTRUM SILVER  PO Take 1 tablet by mouth daily.   divalproex 500 MG 24 hr tablet Commonly known as: Depakote ER Take 2 tablets (1,000 mg total) by mouth at bedtime.   levETIRAcetam 750 MG tablet Commonly known as: KEPPRA Take 1 tablet (750 mg total) by mouth 2 (two) times daily.   losartan 100 MG tablet Commonly known as: COZAAR Take 50 mg by mouth in the morning.   metoprolol succinate 25 MG 24 hr tablet Commonly known as: TOPROL-XL Take 25 mg by mouth daily.   nirmatrelvir/ritonavir 20 x 150 MG & 10 x 100MG  Tabs Commonly known as: PAXLOVID Take 3 tablets by mouth 2 (two) times daily for 5 days. Patient GFR is >60. Take nirmatrelvir (150 mg) two tablets twice daily for 5 days and ritonavir (100 mg) one tablet twice daily for 5 days.   risperiDONE 1 MG tablet Commonly known as: RISPERDAL Take 1 tablet (1 mg total) by mouth at bedtime.        Allergies  Allergen Reactions   Aricept [Donepezil Hcl] Nausea Only   Penicillins Nausea And Vomiting    Has patient had a PCN reaction causing immediate rash, facial/tongue/throat swelling,  SOB or lightheadedness with hypotension: No  Has patient had a PCN reaction causing severe rash involving mucus membranes or skin necrosis: No  Has patient had a PCN reaction that required hospitalization: No  Has patient had a PCN reaction occurring within the last 10 years: No  If all of the above answers are "NO", then may proceed with Cephalosporin use.     Other Procedures/Studies: EEG adult  Result Date: 2022/07/11 Lora Havens, MD     2022-07-11 12:23 PM Patient Name: Tyrone Noble MRN: [id number] Epilepsy Attending: Lora Havens Referring Physician/Provider: Clance Boll, MD Date: Jul 11, 2022 Duration: 23.49 mins Patient history: 72yo M with ams. EEG to evaluate for seizure. Level of alertness: Awake AEDs during EEG study: LEV, VPA Technical aspects: This EEG study was done with scalp electrodes positioned according to the 10-20  International system of electrode placement. Electrical activity was reviewed with band pass filter of 1-70Hz , sensitivity of 7 uV/mm, display speed of 63mm/sec with a 60Hz  notched filter applied as appropriate. EEG data were recorded continuously and digitally stored.  Video monitoring was available and reviewed as appropriate. Description: The posterior dominant rhythm consists of 7 Hz activity of moderate voltage (25-35 uV) seen predominantly in posterior head regions, symmetric and reactive to eye opening and eye closing. EEG showed continuous generalized 5 to 7 Hz theta slowing. Hyperventilation and photic stimulation were not performed.   ABNORMALITY - Continuous slow, generalized IMPRESSION: This study is suggestive of moderate diffuse encephalopathy, nonspecific etiology. No seizures or epileptiform discharges were seen throughout the recording. Priyanka Barbra Sarks   CT HEAD WO CONTRAST (5MM)  Result Date: 06/15/2022 CLINICAL DATA:  Altered mental status and right-sided weakness, initial encounter EXAM: CT HEAD WITHOUT CONTRAST TECHNIQUE: Contiguous axial images were obtained from the base of the skull through the vertex without intravenous contrast. RADIATION DOSE REDUCTION: This exam was performed according to the departmental dose-optimization program which includes automated exposure control, adjustment of the mA and/or kV according to patient size and/or use of iterative reconstruction technique. COMPARISON:  05/03/2021 FINDINGS: Brain: No evidence of acute infarction, hemorrhage, hydrocephalus, extra-axial collection or mass lesion/mass effect. Chronic atrophic and ischemic changes are noted. Vascular: No hyperdense vessel or unexpected calcification. Skull: Normal. Negative for fracture or focal lesion. Sinuses/Orbits: No acute finding. Other: None. IMPRESSION: Chronic atrophic and ischemic changes without acute abnormality. Electronically Signed   By: Inez Catalina M.D.   On: 06/15/2022 02:20   DG  Chest Port 1 View  Result Date: 06/15/2022 CLINICAL DATA:  Mental status change and fever. EXAM: PORTABLE CHEST 1 VIEW COMPARISON:  02/22/2021 FINDINGS: Shallow inspiration with probable linear atelectasis in the lung bases. Heart size and pulmonary vascularity are normal. No focal consolidation in the lungs. No pleural effusions. No pneumothorax. Mediastinal contours appear intact. IMPRESSION: Shallow inspiration with linear atelectasis in the lung bases. No focal consolidation. Electronically Signed   By: Lucienne Capers M.D.   On: 06/15/2022 01:26     TODAY-DAY OF DISCHARGE:  Subjective:   Tyrone Noble today has no headache,no chest abdominal pain,no new weakness tingling or numbness, feels much better wants to go home today.   Objective:   Blood pressure 111/72, pulse 75, temperature 98.3 F (36.8 C), temperature source Axillary, resp. rate 19, weight 68.6 kg, SpO2 96 %.  Intake/Output Summary (Last 24 hours) at 06/18/2022 [id number] Last data filed at 06/17/2022 2004 Gross per 24 hour  Intake 100 ml  Output --  Net 100 ml  Filed Weights   07-15-2022 0500 06/17/22 0500 06/18/22 0646  Weight: 74 kg 74.5 kg 68.6 kg    Exam: Awake Alert, Oriented *3, No new F.N deficits, Normal affect [place].AT,PERRAL Supple Neck,No JVD, No cervical lymphadenopathy appriciated.  Symmetrical Chest wall movement, Good air movement bilaterally, CTAB RRR,No Gallops,Rubs or new Murmurs, No Parasternal Heave +ve B.Sounds, Abd Soft, Non tender, No organomegaly appriciated, No rebound -guarding or rigidity. No Cyanosis, Clubbing or edema, No new Rash or bruise   PERTINENT RADIOLOGIC STUDIES: EEG adult  Result Date: 07-15-22 Lora Havens, MD     2022-07-15 12:23 PM Patient Name: Tyrone Noble MRN: [id number] Epilepsy Attending: Lora Havens Referring Physician/Provider: Clance Boll, MD Date: 07/15/22 Duration: 23.49 mins Patient history: 72yo M with ams. EEG to evaluate for seizure.  Level of alertness: Awake AEDs during EEG study: LEV, VPA Technical aspects: This EEG study was done with scalp electrodes positioned according to the 10-20 International system of electrode placement. Electrical activity was reviewed with band pass filter of 1-70Hz , sensitivity of 7 uV/mm, display speed of 56mm/sec with a 60Hz  notched filter applied as appropriate. EEG data were recorded continuously and digitally stored.  Video monitoring was available and reviewed as appropriate. Description: The posterior dominant rhythm consists of 7 Hz activity of moderate voltage (25-35 uV) seen predominantly in posterior head regions, symmetric and reactive to eye opening and eye closing. EEG showed continuous generalized 5 to 7 Hz theta slowing. Hyperventilation and photic stimulation were not performed.   ABNORMALITY - Continuous slow, generalized IMPRESSION: This study is suggestive of moderate diffuse encephalopathy, nonspecific etiology. No seizures or epileptiform discharges were seen throughout the recording. Priyanka Barbra Sarks     PERTINENT LAB RESULTS: CBC: Recent Labs    Jul 15, 2022 0642  WBC 8.8  HGB 14.1  HCT 42.5  PLT 171   CMET CMP     Component Value Date/Time   NA 134 (L) 07-15-2022 0642   NA 143 05/09/2022 1512   K 4.1 July 15, 2022 0642   CL 100 07/15/22 0642   CO2 22 Jul 15, 2022 0642   GLUCOSE 79 07/15/22 0642   BUN 14 2022/07/15 0642   BUN 12 05/09/2022 1512   CREATININE 1.16 2022/07/15 0642   CALCIUM 8.8 (L) Jul 15, 2022 0642   PROT 6.7 07/15/2022 0642   PROT 6.9 05/09/2022 1512   ALBUMIN 3.4 (L) 2022-07-15 0642   ALBUMIN 4.5 05/09/2022 1512   AST 47 (H) 2022/07/15 0642   ALT 39 July 15, 2022 0642   ALKPHOS 62 15-Jul-2022 0642   BILITOT 0.7 07-15-22 0642   BILITOT 0.3 05/09/2022 1512   GFRNONAA >60 July 15, 2022 0642   GFRAA 77 01/29/2020 0811    GFR Estimated Creatinine Clearance: 51.2 mL/min (by C-G formula based on SCr of 1.16 mg/dL). No results for input(s): "LIPASE",  "AMYLASE" in the last 72 hours. No results for input(s): "CKTOTAL", "CKMB", "CKMBINDEX", "TROPONINI" in the last 72 hours. Invalid input(s): "POCBNP" No results for input(s): "DDIMER" in the last 72 hours. No results for input(s): "HGBA1C" in the last 72 hours. No results for input(s): "CHOL", "HDL", "LDLCALC", "TRIG", "CHOLHDL", "LDLDIRECT" in the last 72 hours. No results for input(s): "TSH", "T4TOTAL", "T3FREE", "THYROIDAB" in the last 72 hours.  Invalid input(s): "FREET3" No results for input(s): "VITAMINB12", "FOLATE", "FERRITIN", "TIBC", "IRON", "RETICCTPCT" in the last 72 hours. Coags: No results for input(s): "INR" in the last 72 hours.  Invalid input(s): "PT" Microbiology: Recent Results (from the past 240 hour(s))  Blood Culture (routine x 2)  Status: None (Preliminary result)   Collection Time: 06/15/22  1:17 AM   Specimen: BLOOD RIGHT FOREARM  Result Value Ref Range Status   Specimen Description BLOOD RIGHT FOREARM  Final   Special Requests   Final    BOTTLES DRAWN AEROBIC AND ANAEROBIC Blood Culture results may not be optimal due to an excessive volume of blood received in culture bottles   Culture   Final    NO GROWTH 2 DAYS Performed at [facility], [place] [address], [place], [place] [address]    Report Status PENDING  Incomplete  Blood Culture (routine x 2)     Status: None (Preliminary result)   Collection Time: 06/15/22  1:40 AM   Specimen: BLOOD  Result Value Ref Range Status   Specimen Description BLOOD LEFT ANTECUBITAL  Final   Special Requests   Final    BOTTLES DRAWN AEROBIC AND ANAEROBIC Blood Culture results may not be optimal due to an excessive volume of blood received in culture bottles   Culture   Final    NO GROWTH 2 DAYS Performed at [facility], [place] [address] [place], [place] [address]    Report Status PENDING  Incomplete  SARS Coronavirus 2 by RT PCR (hospital order, performed in [facility] hospital lab) *cepheid single result  test* Anterior Nasal Swab     Status: Abnormal   Collection Time: 06/15/22  3:02 AM   Specimen: Anterior Nasal Swab  Result Value Ref Range Status   SARS Coronavirus 2 by RT PCR POSITIVE (A) NEGATIVE Final    Comment: Performed at [facility], [place] [address] [place] [address], [place], [place] [address]    FURTHER DISCHARGE INSTRUCTIONS:  Get Medicines reviewed and adjusted: Please take all your medications with you for your next visit with your Primary MD  Laboratory/radiological data: Please request your Primary MD to go over all hospital tests and procedure/radiological results at the follow up, please ask your Primary MD to get all Hospital records sent to his/her office.  In some cases, they will be blood work, cultures and biopsy results pending at the time of your discharge. Please request that your primary care M.D. goes through all the records of your hospital data and follows up on these results.  Also Note the following: If you experience worsening of your admission symptoms, develop shortness of breath, life threatening emergency, suicidal or homicidal thoughts you must seek medical attention immediately by calling 911 or calling your MD immediately  if symptoms less severe.  You must read complete instructions/literature along with all the possible adverse reactions/side effects for all the Medicines you take and that have been prescribed to you. Take any new Medicines after you have completely understood and accpet all the possible adverse reactions/side effects.   Do not drive when taking Pain medications or sleeping medications (Benzodaizepines)  Do not take more than prescribed Pain, Sleep and Anxiety Medications. It is not advisable to combine anxiety,sleep and pain medications without talking with your primary care practitioner  Special Instructions: If you have smoked or chewed Tobacco  in the last 2 yrs please stop smoking, stop any regular Alcohol  and or any Recreational drug  use.  Wear Seat belts while driving.  Please note: You were cared for by a hospitalist during your hospital stay. Once you are discharged, your primary care physician will handle any further medical issues. Please note that NO REFILLS for any discharge medications will be authorized once you are discharged,  as it is imperative that you return to your primary care physician (or establish a relationship with a primary care physician if you do not have one) for your post hospital discharge needs so that they can reassess your need for medications and monitor your lab values.  Total Time spent coordinating discharge including counseling, education and face to face time equals greater than 30 minutes.  SignedOren Binet 06/18/2022 9:06 AM

## 2022-06-18 NOTE — TOC Transition Note (Signed)
Transition of Care [facility]) - CM/SW Discharge Note   Patient Details  Name: Tyrone Noble MRN: [id number] Date of Birth: October 29, 1950  [facility] CM/SW Contact:  Carles Collet, RN Phone Number: 06/18/2022, 9:19 AM   Clinical Narrative:      Damaris Schooner w patient's daughter, Davy Pique. She agrees to [facility] services. Would liketo use [facility] again, referral accepted. Confirmed w daughter, no DME needs.  She will provide transportation home   Barriers to Discharge: No Barriers Identified   Patient Goals and CMS Choice CMS Medicare.gov Compare Post Acute Care list provided to:: Other (Comment Required) Choice offered to / list presented to : Adult Children  Discharge Placement                         Discharge Plan and Services Additional resources added to the After Visit Summary for     Discharge Planning Services: CM Consult Post Acute Care Choice: Home Health                    HH Arranged: PT, OT [facility] Agency: [place] Date [facility] Agency Contacted: 06/18/22 Time HH Agency Contacted: 0919 Representative spoke with at [place]: Ardelle Anton  Social Determinants of Health ([place]) Interventions SDOH Screenings   Tobacco Use: Medium Risk (06/15/2022)     Readmission Risk Interventions     No data to display

## 2022-06-18 NOTE — Progress Notes (Addendum)
1009 Attempted to call daughter Davy Pique) regarding discharge. No answer at this time.   1030 Attempted to call daughter Davy Pique) again without answer.   [place] with daughter Davy Pique). Pt to be picked up around 1300.

## 2022-06-19 ENCOUNTER — Emergency Department (HOSPITAL_COMMUNITY): Payer: Medicare HMO

## 2022-06-19 ENCOUNTER — Emergency Department (HOSPITAL_COMMUNITY)
Admission: EM | Admit: 2022-06-19 | Discharge: 2022-06-19 | Disposition: A | Discharge status: Home / Self Care | Payer: Medicare HMO | Attending: Emergency Medicine | Admitting: Emergency Medicine

## 2022-06-19 ENCOUNTER — Encounter (HOSPITAL_COMMUNITY): Payer: Self-pay

## 2022-06-19 DIAGNOSIS — W19XXXA Unspecified fall, initial encounter: Secondary | ICD-10-CM | POA: Diagnosis not present

## 2022-06-19 DIAGNOSIS — Z8616 Personal history of COVID-19: Secondary | ICD-10-CM | POA: Insufficient documentation

## 2022-06-19 DIAGNOSIS — I10 Essential (primary) hypertension: Secondary | ICD-10-CM | POA: Diagnosis not present

## 2022-06-19 DIAGNOSIS — Y92009 Unspecified place in unspecified non-institutional (private) residence as the place of occurrence of the external cause: Secondary | ICD-10-CM | POA: Insufficient documentation

## 2022-06-19 DIAGNOSIS — G20A1 Parkinson's disease without dyskinesia, without mention of fluctuations: Secondary | ICD-10-CM | POA: Diagnosis not present

## 2022-06-19 DIAGNOSIS — I2510 Atherosclerotic heart disease of native coronary artery without angina pectoris: Secondary | ICD-10-CM | POA: Insufficient documentation

## 2022-06-19 DIAGNOSIS — U071 COVID-19: Secondary | ICD-10-CM | POA: Diagnosis not present

## 2022-06-19 DIAGNOSIS — R531 Weakness: Secondary | ICD-10-CM | POA: Diagnosis not present

## 2022-06-19 DIAGNOSIS — F0390 Unspecified dementia without behavioral disturbance: Secondary | ICD-10-CM | POA: Insufficient documentation

## 2022-06-19 DIAGNOSIS — G319 Degenerative disease of nervous system, unspecified: Secondary | ICD-10-CM | POA: Diagnosis not present

## 2022-06-19 DIAGNOSIS — Z87891 Personal history of nicotine dependence: Secondary | ICD-10-CM | POA: Diagnosis not present

## 2022-06-19 DIAGNOSIS — Z043 Encounter for examination and observation following other accident: Secondary | ICD-10-CM | POA: Diagnosis not present

## 2022-06-19 LAB — CBC
HCT: 43.6 % (ref 39.0–52.0)
Hemoglobin: 14.9 g/dL (ref 13.0–17.0)
MCH: 30.5 pg (ref 26.0–34.0)
MCHC: 34.2 g/dL (ref 30.0–36.0)
MCV: 89.2 fL (ref 80.0–100.0)
Platelets: 204 10*3/uL (ref 150–400)
RBC: 4.89 MIL/uL (ref 4.22–5.81)
RDW: 13.7 % (ref 11.5–15.5)
WBC: 5 10*3/uL (ref 4.0–10.5)
nRBC: 0 % (ref 0.0–0.2)

## 2022-06-19 LAB — COMPREHENSIVE METABOLIC PANEL
ALT: 28 U/L (ref 0–44)
AST: 35 U/L (ref 15–41)
Albumin: 3.3 g/dL — ABNORMAL LOW (ref 3.5–5.0)
Alkaline Phosphatase: 53 U/L (ref 38–126)
Anion gap: 9 (ref 5–15)
BUN: 27 mg/dL — ABNORMAL HIGH (ref 8–23)
CO2: 24 mmol/L (ref 22–32)
Calcium: 8.7 mg/dL — ABNORMAL LOW (ref 8.9–10.3)
Chloride: 99 mmol/L (ref 98–111)
Creatinine, Ser: 1.58 mg/dL — ABNORMAL HIGH (ref 0.61–1.24)
GFR, Estimated: 46 mL/min — ABNORMAL LOW (ref >60)
Glucose, Bld: 96 mg/dL (ref 70–99)
Potassium: 4.2 mmol/L (ref 3.5–5.1)
Sodium: 132 mmol/L — ABNORMAL LOW (ref 135–145)
Total Bilirubin: 0.5 mg/dL (ref 0.3–1.2)
Total Protein: 7 g/dL (ref 6.5–8.1)

## 2022-06-19 MED ORDER — RISPERIDONE 1 MG PO TABS
1.0000 mg | ORAL_TABLET | Freq: Every day | ORAL | Status: DC
  Filled 2022-06-19: qty 1

## 2022-06-19 MED ORDER — CARBIDOPA-LEVODOPA 25-250 MG PO TABS
1.0000 | ORAL_TABLET | Freq: Four times a day (QID) | ORAL | Status: DC
  Administered 2022-06-19 (×2): 1 via ORAL
  Filled 2022-06-19 (×3): qty 1

## 2022-06-19 MED ORDER — LEVETIRACETAM 500 MG PO TABS
750.0000 mg | ORAL_TABLET | Freq: Two times a day (BID) | ORAL | Status: DC
  Administered 2022-06-19 (×2): 750 mg via ORAL
  Filled 2022-06-19 (×2): qty 1

## 2022-06-19 MED ORDER — SODIUM CHLORIDE 0.9 % IV BOLUS
500.0000 mL | Freq: Once | INTRAVENOUS | Status: AC
  Administered 2022-06-19: 500 mL via INTRAVENOUS

## 2022-06-19 MED ORDER — ASPIRIN 81 MG PO CHEW
81.0000 mg | CHEWABLE_TABLET | Freq: Every day | ORAL | Status: DC
  Administered 2022-06-19: 81 mg via ORAL
  Filled 2022-06-19: qty 1

## 2022-06-19 MED ORDER — DIVALPROEX SODIUM ER 500 MG PO TB24
1000.0000 mg | ORAL_TABLET | Freq: Every day | ORAL | Status: DC
  Filled 2022-06-19: qty 2

## 2022-06-19 MED ORDER — METOPROLOL SUCCINATE ER 25 MG PO TB24
25.0000 mg | ORAL_TABLET | Freq: Every day | ORAL | Status: DC
  Administered 2022-06-19: 25 mg via ORAL
  Filled 2022-06-19: qty 1

## 2022-06-19 MED ORDER — ATORVASTATIN CALCIUM 10 MG PO TABS
20.0000 mg | ORAL_TABLET | Freq: Every day | ORAL | Status: DC
  Administered 2022-06-19: 20 mg via ORAL
  Filled 2022-06-19: qty 2

## 2022-06-19 MED ORDER — LOSARTAN POTASSIUM 50 MG PO TABS
50.0000 mg | ORAL_TABLET | Freq: Every morning | ORAL | Status: DC
  Administered 2022-06-19: 50 mg via ORAL
  Filled 2022-06-19: qty 1

## 2022-06-19 NOTE — NC FL2 (Signed)
[place] LEVEL OF CARE FORM     IDENTIFICATION  Patient Name: Tyrone Noble Birthdate: 04-Jul-1950 Sex: male Admission Date (Current Location): 06/19/2022  [facility] and [place] Number:  Herbalist and Address:  [facility], [address] [place], [place] [address]      Provider Number: [id number]  Attending Physician Name and Address:  Regan Lemming, MD  Relative Name and Phone Number:  Kandace Blitz (Daughter)  [phone]    Current Level of Care: Hospital Recommended Level of Care: [place] Prior Approval Number:    Date Approved/Denied:   PASRR Number: Pending  Discharge Plan: SNF    Current Diagnoses: Patient Active Problem List   Diagnosis Date Noted   COVID-19 06/15/2022   Acute encephalopathy 02/22/2021   Failure to thrive in adult    COVID-19 virus infection 03/19/2020   Generalized weakness 03/19/2020   Essential hypertension 03/19/2020   Seizure disorder 03/19/2020   Parkinsonism 02/26/2018   Therapeutic drug monitoring 09/12/2017   Seizures 05/31/2017   Dementia without behavioral disturbance 05/31/2017   New-onset angina 02/16/2015   HYPERLIPIDEMIA 12/13/2009   ALCOHOLISM 07/07/2009   CALLUS, TOE 01/03/2007   DEPRESSION, MAJOR, RECURRENT 05/17/2006   TOBACCO DEPENDENCE 05/17/2006   HYPERTENSION, BENIGN SYSTEMIC 05/17/2006   HEMORRHOIDS, NOS 05/17/2006   GASTROESOPHAGEAL REFLUX, NO ESOPHAGITIS 05/17/2006   LEG PAIN OR KNEE PAIN 05/17/2006    Orientation RESPIRATION BLADDER Height & Weight     Self, Time, Situation, Place  Normal Continent Weight: 151 lb 3.8 oz (68.6 kg) Height:  5\' 6"  (167.6 cm)  BEHAVIORAL SYMPTOMS/MOOD NEUROLOGICAL BOWEL NUTRITION STATUS    Convulsions/Seizures Continent Diet (Heart Healthy)  AMBULATORY STATUS COMMUNICATION OF NEEDS Skin   Limited Assist Verbally Normal                       Personal Care Assistance Level of Assistance   Bathing, Feeding, Dressing Bathing Assistance: Limited assistance Feeding assistance: Independent Dressing Assistance: Limited assistance     Functional Limitations Info  Sight, Hearing, Speech Sight Info: Adequate Hearing Info: Adequate Speech Info: Adequate    SPECIAL CARE FACTORS FREQUENCY                       Contractures Contractures Info: Not present    Additional Factors Info  Code Status, Allergies Code Status Info: FULL Allergies Info: Aricept (donepezil Hcl), Penicillins           Current Medications (06/19/2022):  This is the current hospital active medication list Current Facility-Administered Medications  Medication Dose Route Frequency Provider Last Rate Last Admin   aspirin chewable tablet 81 mg  81 mg Oral Daily Maudie Flakes, MD   81 mg at 06/19/22 1046   atorvastatin (LIPITOR) tablet 20 mg  20 mg Oral Daily Maudie Flakes, MD   20 mg at 06/19/22 1047   carbidopa-levodopa (SINEMET IR) 25-250 MG per tablet immediate release 1 tablet  1 tablet Oral QID Maudie Flakes, MD   1 tablet at 06/19/22 1048   divalproex (DEPAKOTE ER) 24 hr tablet 1,000 mg  1,000 mg Oral QHS Maudie Flakes, MD       levETIRAcetam (KEPPRA) tablet 750 mg  750 mg Oral BID Maudie Flakes, MD   750 mg at 06/19/22 1054   losartan (COZAAR) tablet 50 mg  50 mg Oral q AM Maudie Flakes, MD   50 mg at 06/19/22 [phone]  metoprolol succinate (TOPROL-XL) 24 hr tablet 25 mg  25 mg Oral Daily Maudie Flakes, MD   25 mg at 06/19/22 1047   risperiDONE (RISPERDAL) tablet 1 mg  1 mg Oral QHS Maudie Flakes, MD       Current Outpatient Medications  Medication Sig Dispense Refill   aspirin 81 MG chewable tablet Chew 1 tablet (81 mg total) by mouth daily. 30 tablet 3   atorvastatin (LIPITOR) 20 MG tablet Take 20 mg by mouth daily.   5   carbidopa-levodopa (SINEMET IR) 25-250 MG tablet Take 1 tablet by mouth 4 (four) times daily. 120 tablet 3   divalproex (DEPAKOTE ER) 500 MG 24 hr tablet Take  2 tablets (1,000 mg total) by mouth at bedtime. 180 tablet 3   levETIRAcetam (KEPPRA) 750 MG tablet Take 1 tablet (750 mg total) by mouth 2 (two) times daily. 60 tablet 11   losartan (COZAAR) 100 MG tablet Take 50 mg by mouth in the morning.     metoprolol succinate (TOPROL-XL) 25 MG 24 hr tablet Take 25 mg by mouth daily.     Multiple Vitamins-Minerals (CENTRUM SILVER PO) Take 1 tablet by mouth daily.     nirmatrelvir/ritonavir (PAXLOVID) 20 x 150 MG & 10 x 100MG  TABS Take 3 tablets by mouth 2 (two) times daily for 5 days. Patient GFR is >60. Take nirmatrelvir (150 mg) two tablets twice daily for 5 days and ritonavir (100 mg) one tablet twice daily for 5 days. 30 tablet 0   risperiDONE (RISPERDAL) 1 MG tablet Take 1 tablet (1 mg total) by mouth at bedtime. 30 tablet 11     Discharge Medications: Please see discharge summary for a list of discharge medications.  Relevant Imaging Results:  Relevant Lab Results:   Additional Information SSN# [id number]  Raina Mina, LCSWA

## 2022-06-19 NOTE — Progress Notes (Signed)
Per discussion with Raina Mina, LCSWA, the patient's daughter is electing to take the patient home. The patient will benefit from home health therapies to aide in improving balance and reducing falls risk.  Zenaida Niece, PT, DPT Acute Rehabilitation Office [phone]

## 2022-06-19 NOTE — TOC Initial Note (Signed)
Transition of Care [facility]) - Initial/Assessment Note    Patient Details  Name: Tyrone Noble MRN: [id number] Date of Birth: 07/18/1950  Transition of Care [facility]) CM/SW Contact:    Raina Mina, [employer] Phone Number: 06/19/2022, 12:22 PM  Clinical Narrative: CSW spoke with patients daughter, Kandace Blitz [phone]. Patients daughter wants patient to go to [facility] or [facility] for [place]. CSW told patients daughter the barrier will be that patient is covid positive and if the facilities are requesting a 10 day quarantine period that patient will not be able to board that long in the ED. Patients daughter stated that she understands. CSW spoke with [place] in admissions at [facility] and they have a 10 day quarantine period before they can take covid positive patients. [facility] is not in network with patients insurance.                Expected Discharge Plan: Skilled Nursing Facility Barriers to Discharge: Continued Medical Work up   Patient Goals and CMS Choice Patient states their goals for this hospitalization and ongoing recovery are:: Return Home   Choice offered to / list presented to : Adult Children      Expected Discharge Plan and Services     Post Acute Care Choice: Home Health                     DME Agency: [facility]       HH Arranged: PT, OT [facility] Agency: [facility] Date [facility] Agency Contacted: 06/18/22      Prior Living Arrangements/Services     Patient language and need for interpreter reviewed:: Yes Do you feel safe going back to the place where you live?: Yes      Need for Family Participation in Patient Care: Yes (Comment) Care giver support system in place?: Yes (comment)   Criminal Activity/Legal Involvement Pertinent to Current Situation/Hospitalization: No - Comment as needed  Activities of Daily Living      Permission Sought/Granted                  Emotional Assessment       Orientation: :  Oriented to Self, Oriented to Place, Oriented to  Time, Oriented to Situation Alcohol / Substance Use: Not Applicable Psych Involvement: No (comment)  Admission diagnosis:  weakness Patient Active Problem List   Diagnosis Date Noted   COVID-19 06/15/2022   Acute encephalopathy 02/22/2021   Failure to thrive in adult    COVID-19 virus infection 03/19/2020   Generalized weakness 03/19/2020   Essential hypertension 03/19/2020   Seizure disorder 03/19/2020   Parkinsonism 02/26/2018   Therapeutic drug monitoring 09/12/2017   Seizures 05/31/2017   Dementia without behavioral disturbance 05/31/2017   New-onset angina 02/16/2015   HYPERLIPIDEMIA 12/13/2009   ALCOHOLISM 07/07/2009   CALLUS, TOE 01/03/2007   DEPRESSION, MAJOR, RECURRENT 05/17/2006   TOBACCO DEPENDENCE 05/17/2006   HYPERTENSION, BENIGN SYSTEMIC 05/17/2006   HEMORRHOIDS, NOS 05/17/2006   GASTROESOPHAGEAL REFLUX, NO ESOPHAGITIS 05/17/2006   LEG PAIN OR KNEE PAIN 05/17/2006   PCP:  Lujean Amel, MD Pharmacy:   [facility] [place], [place] - [address] [place] [address] Phone: [phone] Fax: [phone]  [id number] - [place], [place] [address] Phone: [phone] Fax: [phone]     Social Determinants of Health (SDOH) Social History: SDOH Screenings  Tobacco Use: Medium Risk (06/19/2022)   SDOH Interventions:     Readmission Risk Interventions     No data to display

## 2022-06-19 NOTE — ED Provider Notes (Signed)
Emergency Medicine Observation Re-evaluation Note  Tyrone Noble is a 72 y.o. male, seen on rounds today.  Pt initially presented to the ED for complaints of Weakness Currently, the patient is resting.  Physical Exam  BP 126/76   Pulse 80   Temp (!) 97.5 F (36.4 C) (Oral)   Resp 18   Ht 5\' 6"  (1.676 m)   Wt 68.6 kg   SpO2 99%   BMI 24.41 kg/m  Physical Exam General: NAD   ED Course / MDM  EKG:EKG Interpretation  Date/Time:  Monday June 19 2022 00:39:59 EDT Ventricular Rate:  86 PR Interval:  160 QRS Duration: 148 QT Interval:  395 QTC Calculation: 473 R Axis:   262 Text Interpretation: Sinus rhythm RBBB and LAFB Confirmed by Gerlene Fee [phone]) on 06/19/2022 12:45:08 AM  I have reviewed the labs performed to date as well as medications administered while in observation.  Recent changes in the last 24 hours include Pt considered for placement, daughter has changed her mind and wishes to take the patient home.  Plan  Current plan is for DC.    Regan Lemming, MD 06/19/22 [phone]

## 2022-06-19 NOTE — ED Notes (Signed)
RN ordered pt food tray.

## 2022-06-19 NOTE — ED Notes (Signed)
Pt daughter spoke with the DON Rita at [facility] and was informed placement is available and paperwork needs to be sent. RN notified case mgmt and CSW

## 2022-06-19 NOTE — ED Notes (Signed)
Patient attempted to come get out of bed  bed alarm on , educated patient again,

## 2022-06-19 NOTE — ED Provider Notes (Signed)
[facility] Emergency Department Provider Note MRN:  [id number]  Arrival date & time: 06/19/22     Chief Complaint   Fall History of Present Illness   Tyrone Noble is a 72 y.o. year-old male with a history of dementia, Parkinson's disease presenting to the ED with chief complaint of fall.  Patient discharged from the hospital earlier today, was admitted for COVID and encephalopathy, weakness.  Functionally still struggling, required a lot of assistance at home.  Still seemed very weak and unsteady.  Had a fall this evening.  Family does not think he passed out.  Patient denies any pain.  History obtained from daughter at bedside.  Review of Systems  A thorough review of systems was obtained and all systems are negative except as noted in the HPI and PMH.   Patient's Health History    Past Medical History:  Diagnosis Date   Coronary artery disease    Dementia    Depression    Heart disease    Hypertension    Memory loss    Seizures    Stroke    "years ago" (02/16/2015)    Past Surgical History:  Procedure Laterality Date   CARDIAC CATHETERIZATION N/A 02/16/2015   Procedure: Left Heart Cath and Coronary Angiography;  Surgeon: Charolette Forward, MD;  Location: [facility] CV [facility];  Service: Cardiovascular;  Laterality: N/A;   CARDIAC CATHETERIZATION N/A 02/16/2015   Procedure: Coronary Stent Intervention;  Surgeon: Charolette Forward, MD;  Location: [facility];  Service: Cardiovascular;  Laterality: N/A;   CARDIAC CATHETERIZATION N/A 02/16/2015   Procedure: Intravascular Pressure Wire/FFR Study;  Surgeon: Charolette Forward, MD;  Location: [facility];  Service: Cardiovascular;  Laterality: N/A;   CORONARY ANGIOPLASTY     LEFT HEART CATH AND CORONARY ANGIOGRAPHY N/A 03/29/2017   Procedure: LEFT HEART CATH AND CORONARY ANGIOGRAPHY;  Surgeon: Charolette Forward, MD;  Location: [facility] CV [facility];  Service: Cardiovascular;  Laterality: N/A;   LEFT HEART  CATHETERIZATION WITH CORONARY ANGIOGRAM N/A 03/19/2012   Procedure: LEFT HEART CATHETERIZATION WITH CORONARY ANGIOGRAM;  Surgeon: Clent Demark, MD;  Location: [facility] CATH LAB;  Service: Cardiovascular;  Laterality: N/A;   WISDOM TOOTH EXTRACTION      Family History  Problem Relation Age of Onset   Diabetes Mother    Hypertension Mother    Stroke Father    Cancer Sister     Social History   Socioeconomic History   Marital status: Divorced    Spouse name: Not on file   Number of children: 2   Years of education: 12   Highest education level: Not on file  Occupational History    Comment: retired Administrator  Tobacco Use   Smoking status: Former   Smokeless tobacco: Never   Tobacco comments:    quit many years ago  Substance and Sexual Activity   Alcohol use: Not Currently    Comment: 02/16/2015 "last alcohol was in ~ 2012"   Drug use: No   Sexual activity: Not Currently  Other Topics Concern   Not on file  Social History Narrative   Lives with daughter    caffeine -coffee,  1 cup daily   Social Determinants of Health   Financial Resource Strain: Not on file  Food Insecurity: Not on file  Transportation Needs: Not on file  Physical Activity: Not on file  Stress: Not on file  Social Connections: Not on file  Intimate Partner Violence: Not on file  Physical Exam   Vitals:   06/19/22 0045 06/19/22 0245  BP: 122/78   Pulse: 84 74  Resp: 18 11  Temp:    SpO2: 98% 99%    CONSTITUTIONAL: Well-appearing, NAD NEURO/PSYCH:  Alert oriented to name, moves all extremities, quiet speech EYES:  eyes equal and reactive ENT/NECK:  no LAD, no JVD CARDIO: Regular rate, well-perfused, normal S1 and S2 PULM:  CTAB no wheezing or rhonchi GI/GU:  non-distended, non-tender MSK/SPINE:  No gross deformities, no edema SKIN:  no rash, atraumatic   *Additional and/or pertinent findings included in MDM below  Diagnostic and Interventional Summary    EKG  Interpretation  Date/Time:  Monday June 19 2022 00:39:59 EDT Ventricular Rate:  86 PR Interval:  160 QRS Duration: 148 QT Interval:  395 QTC Calculation: 473 R Axis:   262 Text Interpretation: Sinus rhythm RBBB and LAFB Confirmed by Gerlene Fee [phone]) on 06/19/2022 12:45:08 AM       Labs Reviewed  COMPREHENSIVE METABOLIC PANEL - Abnormal; Notable for the following components:      Result Value   Sodium 132 (*)    BUN 27 (*)    Creatinine, Ser 1.58 (*)    Calcium 8.7 (*)    Albumin 3.3 (*)    GFR, Estimated 46 (*)    All other components within normal limits  CBC    CT HEAD WO CONTRAST (5MM)  Final Result    DG Chest Port 1 View  Final Result      Medications  sodium chloride 0.9 % bolus 500 mL (has no administration in time range)  aspirin chewable tablet 81 mg (has no administration in time range)  metoprolol succinate (TOPROL-XL) 24 hr tablet 25 mg (has no administration in time range)  atorvastatin (LIPITOR) tablet 20 mg (has no administration in time range)  losartan (COZAAR) tablet 50 mg (has no administration in time range)  carbidopa-levodopa (SINEMET IR) 25-250 MG per tablet immediate release 1 tablet (has no administration in time range)  divalproex (DEPAKOTE ER) 24 hr tablet 1,000 mg (has no administration in time range)  risperiDONE (RISPERDAL) tablet 1 mg (has no administration in time range)  levETIRAcetam (KEPPRA) tablet 750 mg (has no administration in time range)     Procedures  /  Critical Care Procedures  ED Course and Medical Decision Making  Initial Impression and Ddx Initial report of syncope with loss of consciousness today but daughter at bedside explains that she thinks it was just a fall.  Weak and unsteady.  Suspect continued weakness in the setting of recent COVID, encephalopathy, underlying Parkinson's and dementia.  Mostly here because of poor functional status and inability to care for him at home.  Past medical/surgical history that  increases complexity of ED encounter: Parkinson's, dementia  Interpretation of Diagnostics I personally reviewed the EKG and my interpretation is as follows: Sinus rhythm, bundle branch block  Labs revealing mild elevation in creatinine and BUN, otherwise no significant blood count or electrolyte disturbance  Patient Reassessment and Ultimate Disposition/Management     Patient continues to sit comfortably with normal vital signs, no indication for admission.  Family interested in placement.  Consulting TOC.  Signed out to default provider.  Patient management required discussion with the following services or consulting groups:  None  Complexity of Problems Addressed Acute illness or injury that poses threat of life of bodily function  Additional Data Reviewed and Analyzed Further history obtained from: Further history from spouse/family member  Additional Factors  Impacting ED Encounter Risk Consideration of hospitalization  Barth Kirks. [place] Small, [place] [email or web address]  Final Clinical Impressions(s) / ED Diagnoses     ICD-10-CM   1. Fall, initial encounter  W19.XXXA     2. Weakness  R53.1       ED Discharge Orders     None        Discharge Instructions Discussed with and Provided to Patient:   Discharge Instructions   None      Maudie Flakes, MD 06/19/22 [phone]

## 2022-06-19 NOTE — ED Notes (Signed)
Pt daughter stated she would like to take father home since pt cannot stay until SNF placement. RN notified CSW and EDP.  NT assisting pt with getting dressed and placed in wheel  chair.   RN reviewed d/c paperwork with daughter

## 2022-06-19 NOTE — Evaluation (Signed)
Physical Therapy Evaluation Patient Details Name: Tyrone Noble MRN: [id number] DOB: [birth year] Today's Date: 06/19/2022  History of Present Illness  72 y.o. male presents to [facility] hospital on 06/18/2022 after a fall. Pt discharged earlier in the day, admitted for management of AMS and found to be COVID+. PMH includes HTN, HLD, CAD, depression, CVA, Parkinson's disease, dementia.  Clinical Impression  Pt presents to PT with deficits in cognition, gait, balance, functional mobility, endurance, power, strength. Pt is limited by impaired cognition due to dementia, with poor awareness of his deficits. Pt is incontinent of urine during session, and remains at a high risk for falls due to shuffling gait and intermittent posterior lean. Pt will benefit from continued acute PT services in an effort to reduce falls risk. Pt will benefit from increased supervision and inpatient PT services at the time of discharge..       Recommendations for follow up therapy are one component of a multi-disciplinary discharge planning process, led by the attending physician.  Recommendations may be updated based on patient status, additional functional criteria and insurance authorization.  Follow Up Recommendations Can patient physically be transported by private vehicle: Yes     Assistance Recommended at Discharge Frequent or constant Supervision/Assistance  Patient can return home with the following  A little help with walking and/or transfers;A lot of help with bathing/dressing/bathroom;Assistance with cooking/housework;Direct supervision/assist for medications management;Direct supervision/assist for financial management;Assist for transportation;Help with stairs or ramp for entrance    Equipment Recommendations Rollator (4 wheels)  Recommendations for Other Services       Functional Status Assessment Patient has had a recent decline in their functional status and demonstrates the ability to make significant  improvements in function in a reasonable and predictable amount of time.     Precautions / Restrictions Precautions Precautions: Fall Precaution Comments: dementia, parkinson's disease fell day of presentation to ED Restrictions Weight Bearing Restrictions: No      Mobility  Bed Mobility Overal bed mobility: Needs Assistance Bed Mobility: Supine to Sit, Sit to Supine     Supine to sit: Min assist, HOB elevated Sit to supine: Min assist, HOB elevated        Transfers Overall transfer level: Needs assistance Equipment used: None Transfers: Sit to/from Stand Sit to Stand: Min guard           General transfer comment: posterior lean    Ambulation/Gait Ambulation/Gait assistance: Min assist Gait Distance (Feet): 15 Feet Assistive device: None Gait Pattern/deviations: Shuffle Gait velocity: reduced Gait velocity interpretation: <1.31 ft/sec, indicative of household ambulator   General Gait Details: pt with slowed shuffling gait, increased lateral and AP sway. Pt with poor tolerance for standing or ambulation at this time  Stairs            Wheelchair Mobility    Modified Rankin (Stroke Patients Only)       Balance Overall balance assessment: Needs assistance Sitting-balance support: No upper extremity supported, Feet supported Sitting balance-Leahy Scale: Fair     Standing balance support: No upper extremity supported, During functional activity Standing balance-Leahy Scale: Poor Standing balance comment: minG-minA, posterior lean                             Pertinent Vitals/Pain Pain Assessment Pain Assessment: No/denies pain    Home Living Family/patient expects to be discharged to:: Private residence Living Arrangements: Children Available Help at Discharge: Family;Available 24 hours/day Type of Home: [place]  Access: Stairs to enter Entrance Stairs-Rails: None Entrance Stairs-Number of Steps: 1   Home Layout: One  level Home Equipment: None      Prior Function Prior Level of Function : Needs assist             Mobility Comments: supervision at all times due to cognitive deficits but mobilizes without physical assistance ADLs Comments: supervision at all times     Hand Dominance   Dominant Hand: Right    Extremity/Trunk Assessment   Upper Extremity Assessment Upper Extremity Assessment: Generalized weakness    Lower Extremity Assessment Lower Extremity Assessment: Generalized weakness (BUE intention tremor, LUE worse than RUE)    Cervical / Trunk Assessment Cervical / Trunk Assessment: Normal  Communication   Communication: No difficulties  Cognition Arousal/Alertness: Awake/alert Behavior During Therapy: Flat affect Overall Cognitive Status: History of cognitive impairments - at baseline                                 General Comments: dementia at baseline, pt is incontinent of urine during session. Pt's daughter reports pt is typically continent and cites this as a more acute change        General Comments General comments (skin integrity, edema, etc.): VSS on RA, pt is incontinent of urine during session    Exercises     Assessment/Plan    PT Assessment Patient needs continued PT services  PT Problem List Decreased strength;Decreased activity tolerance;Decreased balance;Decreased mobility;Decreased cognition;Decreased knowledge of use of DME;Decreased safety awareness;Decreased knowledge of precautions;Cardiopulmonary status limiting activity       PT Treatment Interventions DME instruction;Gait training;Functional mobility training;Stair training;Therapeutic activities;Therapeutic exercise;Balance training;Neuromuscular re-education;Patient/family education;Cognitive remediation    PT Goals (Current goals can be found in the Care Plan section)  Acute Rehab PT Goals Patient Stated Goal: to reduce risk for falls, have increased supervision PT Goal  Formulation: With family Time For Goal Achievement: 07/03/22 Potential to Achieve Goals: Fair Additional Goals Additional Goal #1: Pt will score >19/24 on the DGI to indicate a reduced risk for falls    Frequency Min 2X/week     Co-evaluation               AM-PAC PT "6 Clicks" Mobility  Outcome Measure Help needed turning from your back to your side while in a flat bed without using bedrails?: A Little Help needed moving from lying on your back to sitting on the side of a flat bed without using bedrails?: A Little Help needed moving to and from a bed to a chair (including a wheelchair)?: A Little Help needed standing up from a chair using your arms (e.g., wheelchair or bedside chair)?: A Little Help needed to walk in hospital room?: A Little Help needed climbing 3-5 steps with a railing? : Total 6 Click Score: 16    End of Session   Activity Tolerance: Patient limited by fatigue Patient left: in bed;with call bell/phone within reach;with bed alarm set;with family/visitor present Nurse Communication: Mobility status PT Visit Diagnosis: Other abnormalities of gait and mobility (R26.89);Muscle weakness (generalized) (M62.81)    Time: [id number] PT Time Calculation (min) (ACUTE ONLY): 26 min   Charges:   PT Evaluation $PT Eval Low Complexity: [place], PT, DPT Acute Rehabilitation Office [phone]   Zenaida Niece 06/19/2022, 9:10 AM

## 2022-06-19 NOTE — ED Triage Notes (Signed)
Pt to ED via EMS from home. Pt c/o syncopal episode today. +LOC. Unknown how long pt was unresponsive for. Pt c/o weakness x a week and seen recently for same. Pt AAOx4 upon arrival to ED.   EMS Vitals: 150/80 88 HR 97% RA 110 CBG 18 RR 20 L hand

## 2022-06-19 NOTE — Discharge Planning (Signed)
Pt currently active with [facility] for [place] services RN as confirmed by [facility] with Patient Ping.

## 2022-06-19 NOTE — Progress Notes (Signed)
Patients daughter is now taking patient home. Home health PT/OT was setup on 06/18/22.

## 2022-06-19 NOTE — ED Notes (Signed)
Patient was trying to get up, instructed patient to use call light when he needed something, Assisted patient with urinal , repositioned in bed call light with patient.

## 2022-06-20 LAB — CULTURE, BLOOD (ROUTINE X 2)
Culture: NO GROWTH
Culture: NO GROWTH

## 2022-06-22 DIAGNOSIS — G20A1 Parkinson's disease without dyskinesia, without mention of fluctuations: Secondary | ICD-10-CM | POA: Diagnosis not present

## 2022-06-22 DIAGNOSIS — E785 Hyperlipidemia, unspecified: Secondary | ICD-10-CM | POA: Diagnosis not present

## 2022-06-22 DIAGNOSIS — F0283 Dementia in other diseases classified elsewhere, unspecified severity, with mood disturbance: Secondary | ICD-10-CM | POA: Diagnosis not present

## 2022-06-22 DIAGNOSIS — I2510 Atherosclerotic heart disease of native coronary artery without angina pectoris: Secondary | ICD-10-CM | POA: Diagnosis not present

## 2022-06-22 DIAGNOSIS — U071 COVID-19: Secondary | ICD-10-CM | POA: Diagnosis not present

## 2022-06-22 DIAGNOSIS — F32A Depression, unspecified: Secondary | ICD-10-CM | POA: Diagnosis not present

## 2022-06-22 DIAGNOSIS — G40909 Epilepsy, unspecified, not intractable, without status epilepticus: Secondary | ICD-10-CM | POA: Diagnosis not present

## 2022-06-22 DIAGNOSIS — G9341 Metabolic encephalopathy: Secondary | ICD-10-CM | POA: Diagnosis not present

## 2022-06-22 DIAGNOSIS — I10 Essential (primary) hypertension: Secondary | ICD-10-CM | POA: Diagnosis not present

## 2022-06-23 DIAGNOSIS — I2510 Atherosclerotic heart disease of native coronary artery without angina pectoris: Secondary | ICD-10-CM | POA: Diagnosis not present

## 2022-06-23 DIAGNOSIS — F32A Depression, unspecified: Secondary | ICD-10-CM | POA: Diagnosis not present

## 2022-06-23 DIAGNOSIS — I10 Essential (primary) hypertension: Secondary | ICD-10-CM | POA: Diagnosis not present

## 2022-06-23 DIAGNOSIS — G20A1 Parkinson's disease without dyskinesia, without mention of fluctuations: Secondary | ICD-10-CM | POA: Diagnosis not present

## 2022-06-23 DIAGNOSIS — E785 Hyperlipidemia, unspecified: Secondary | ICD-10-CM | POA: Diagnosis not present

## 2022-06-23 DIAGNOSIS — F0283 Dementia in other diseases classified elsewhere, unspecified severity, with mood disturbance: Secondary | ICD-10-CM | POA: Diagnosis not present

## 2022-06-23 DIAGNOSIS — G40909 Epilepsy, unspecified, not intractable, without status epilepticus: Secondary | ICD-10-CM | POA: Diagnosis not present

## 2022-06-23 DIAGNOSIS — G9341 Metabolic encephalopathy: Secondary | ICD-10-CM | POA: Diagnosis not present

## 2022-06-23 DIAGNOSIS — U071 COVID-19: Secondary | ICD-10-CM | POA: Diagnosis not present

## 2022-06-27 DIAGNOSIS — U071 COVID-19: Secondary | ICD-10-CM | POA: Diagnosis not present

## 2022-11-08 ENCOUNTER — Ambulatory Visit: Payer: Medicare HMO | Admitting: Neurology

## 2022-11-08 ENCOUNTER — Encounter: Payer: Self-pay | Admitting: Neurology

## 2022-11-08 VITALS — BP 141/85 | HR 68 | Ht 67.0 in | Wt 165.5 lb

## 2022-11-08 DIAGNOSIS — F0390 Unspecified dementia without behavioral disturbance: Secondary | ICD-10-CM | POA: Diagnosis not present

## 2022-11-08 DIAGNOSIS — G40909 Epilepsy, unspecified, not intractable, without status epilepticus: Secondary | ICD-10-CM | POA: Diagnosis not present

## 2022-11-08 DIAGNOSIS — G20C Parkinsonism, unspecified: Secondary | ICD-10-CM | POA: Diagnosis not present

## 2022-11-08 MED ORDER — DIVALPROEX SODIUM ER 500 MG PO TB24: 1000.0000 mg | ORAL_TABLET | Freq: Every day | ORAL | 3 refills | Status: DC

## 2022-11-08 MED ORDER — RISPERIDONE 1 MG PO TABS: 1.0000 mg | ORAL_TABLET | Freq: Every day | ORAL | 11 refills | Status: AC

## 2022-11-08 MED ORDER — LEVETIRACETAM 750 MG PO TABS: 750.0000 mg | ORAL_TABLET | Freq: Two times a day (BID) | ORAL | 11 refills | Status: DC

## 2022-11-08 MED ORDER — CARBIDOPA-LEVODOPA 25-250 MG PO TABS: 1.0000 | ORAL_TABLET | Freq: Four times a day (QID) | ORAL | 11 refills | Status: DC

## 2022-11-08 NOTE — Patient Instructions (Signed)
Continue current medications.  Call for seizures.  Will see you back in 8 months.  Thanks!!

## 2022-11-08 NOTE — Progress Notes (Signed)
HISTORY OF PRESENT ILLNESS: Tyrone Noble is a 72 year old male, seen in refer by his primary care doctor [name], [name] for evaluation of seizure, he is accompanied by his son in law Alcario Drought at today's clinical visit, he has lived with his daughter's family for 5 years.   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 Marjory Lies in 2017 for dementia, he did also have a history of alcohol abuse, in remission for many years.   He presented to the emergency room on May 26, 2017 for seizure, he was ready to have breakfast, without warning signs, he began to stare into the space, not responding, whole body shaking, foaming, out of his mouth, postevent confusion last for a few minutes, paramedic was called, he was taken to the emergency room,   MRI of the brain showed no acute abnormality, generalized atrophy, moderate supratentorium small vessel disease.   Laboratory evaluation seen March 2019 showed normal CBC, BMP showed elevated creatinine 1.37, GFR of 51, negative troponin, UA was normal   He is now back to baseline, family denies significant agitations, Mini-Mental Status Examination only 18 out of 30 today   UPDATE Feb 26 2018: He is accompanied by his daughter and son in law at visit. He is overall stable,continue to complains of short term memory loss, slow walking since 2018, sleeps too much, poor appetite,    Last seizure was on Nov 30th 2019, he was awake, not responsive,  Feb 18 2018,  Sudden onset of disorientation confusion   He is taking Depakote DR 500 mg 2 tablets every night   UPDATE Feb 06 2019: Last visit was in October 2020, he had a recurrent seizure on December 19, 2018, his Keppra dose was  increased to 1000 mg twice a day, Depakote ER 200 mg daily   He continues to have seizure, had 2 recurrent seizure since last visit, the most recent one was on January 30, 2019, body shaking, not responsive   Laboratory evaluations December 24, 2018 showed Depakote level of 63, Keppra level of 38, CMP showed elevated creatinine 1.36, UDS was negative   He is taking Sinemet 25/100 mg 3 times a day, which has helped his walking,   In addition, he was noted to have worsening confusion, auditory hallucinations, scared, agitated sometimes,   He continue has mild gait abnormality, taking Sinemet 25/100 mg 3 times a day, which was helpful  UPDATE Jan 29 2020: He is accompanied by his daughter at today's clinical visit, he has been doing well over the past few months, last reported seizure was in July 2021, was found down at bathroom, in the middle of the seizing,  He is now taking Depakote ER 500 mg 2 tablets at bedtime, was on Keppra 500 mg 2 tablets twice a day, but by mistake, since November 2021, he was only getting Keppra 500 mg twice a day, daughter did not notice any difference, there was no recurrent seizure  He continued to attend [facility] day program 5 times a week, taking Sinemet 25/100 mg 3 times a day, also Risperdal 1 mg every night,  Daughter is overall about his current progress  UPDATE August 14th 2023: He is accompanied by his daughter and son-in-law at today's clinical  visit, lives at home with his daughter, attending day program 5 days a week, overall doing well,  He had a small recurrent seizure in August 2023 after missing his levetiracetam, currently taking 500 mg twice a day along with Depakote ER 500 mg 2 tablets every night, risperidone 1 mg every night for evening time agitations  Overall is stable,  Examination today was performed 2 hours after last dose of Sinemet, significant parkinsonian features, will increase Sinemet 25/100 from 3 to 4 tablets daily  Update  May 09, 2022 SS: Here with son in Social worker. He is doing well. Last increased Sinemet 25/100 to 4 times daily, now taking 7 AM, 10 AM, 2 PM, 8 PM. Hasn't seen much change. Getting around okay, slow in the morning. Sleeping well at night, gets up 2-3 times at night. No falls. Takes 1 mg Risperdal at night keeps him calm. still on Keppra and Depakote. Reports 1 seizure in Dec while getting AM shower, eyes rolled back, he was shaking, confused for about 10 minutes.   Update November 08, 2022 SS: Labs February 2024 Depakote level 72, Keppra level 18.3.  Keppra was increased to 750 mg twice daily due to seizure.  Hospitalized March 2024 for COVID, acute encephalopathy, EEG showed no seizures. No seizures since Dec 2023. Still shuffles, no falls. Mood is upbeat. Sometimes gets confusion, hallucinations, not scary, not agitated. Sleeps well, agreeable to get along with, eats well. Goes to [facility] 5 days a week.    REVIEW OF SYSTEMS: Out of a complete 14 system review of symptoms, the patient complains only of the following symptoms, and all other reviewed systems are negative.  See HPI  ALLERGIES: Allergies  Allergen Reactions   Aricept [Donepezil Hcl] Nausea Only   Penicillins Nausea And Vomiting    Has patient had a PCN reaction causing immediate rash, facial/tongue/throat swelling, SOB or lightheadedness with hypotension: No  Has patient had a PCN reaction causing severe rash involving mucus membranes or skin necrosis: No  Has patient had a PCN reaction that required hospitalization: No  Has patient had a PCN reaction occurring within the last 10 years: No  If all of the above answers are "NO", then may proceed with Cephalosporin use.    HOME MEDICATIONS: Outpatient Medications Prior to Visit  Medication Sig Dispense Refill   aspirin 81 MG chewable tablet Chew 1 tablet (81 mg total) by mouth daily. 30 tablet 3   atorvastatin (LIPITOR) 20 MG tablet Take 20 mg by mouth daily.   5    carbidopa-levodopa (SINEMET IR) 25-250 MG tablet Take 1 tablet by mouth 4 (four) times daily. 120 tablet 3   divalproex (DEPAKOTE ER) 500 MG 24 hr tablet Take 2 tablets (1,000 mg total) by mouth at bedtime. 180 tablet 3   levETIRAcetam (KEPPRA) 750 MG tablet Take 1 tablet (750 mg total) by mouth 2 (two) times daily. 60 tablet 11   losartan (COZAAR) 100 MG tablet Take 50 mg by mouth in the morning.     metoprolol succinate (TOPROL-XL) 25 MG 24 hr tablet Take 25 mg by mouth daily.     Multiple Vitamins-Minerals (CENTRUM SILVER PO) Take 1 tablet by mouth daily.     risperiDONE (RISPERDAL) 1 MG tablet Take 1 tablet (1 mg total) by mouth at bedtime. 30 tablet 11   No facility-administered medications prior to visit.    PAST MEDICAL HISTORY: Past Medical History:  Diagnosis Date   Coronary artery disease    Dementia (HCC)  Depression    Heart disease    Hypertension    Memory loss    Seizures (HCC)    Stroke (HCC)    "years ago" (02/16/2015)    PAST SURGICAL HISTORY: Past Surgical History:  Procedure Laterality Date   CARDIAC CATHETERIZATION N/A 02/16/2015   Procedure: Left Heart Cath and Coronary Angiography;  Surgeon: Rinaldo Cloud, MD;  Location: [facility] INVASIVE CV LAB;  Service: Cardiovascular;  Laterality: N/A;   CARDIAC CATHETERIZATION N/A 02/16/2015   Procedure: Coronary Stent Intervention;  Surgeon: Rinaldo Cloud, MD;  Location: [facility] INVASIVE CV LAB;  Service: Cardiovascular;  Laterality: N/A;   CARDIAC CATHETERIZATION N/A 02/16/2015   Procedure: Intravascular Pressure Wire/FFR Study;  Surgeon: Rinaldo Cloud, MD;  Location[facility] INVASIVE CV LAB;  Service: Cardiovascular;  Laterality: N/A;   CORONARY ANGIOPLASTY     LEFT HEART CATH AND CORONARY ANGIOGRAPHY N/A 03/29/2017   Procedure: LEFT HEART CATH AND CORONARY ANGIOGRAPHY;  Surgeon: Rinaldo Cloud, MD;  Location: [facility] INVASIVE CV LAB;  Service: Cardiovascular;  Laterality: N/A;   LEFT HEART CATHETERIZATION WITH CORONARY ANGIOGRAM N/A  03/19/2012   Procedure: LEFT HEART CATHETERIZATION WITH CORONARY ANGIOGRAM;  Surgeon: Robynn Pane, MD;  Location: [facility] CATH LAB;  Service: Cardiovascular;  Laterality: N/A;   WISDOM TOOTH EXTRACTION      FAMILY HISTORY: Family History  Problem Relation Age of Onset   Diabetes Mother    Hypertension Mother    Stroke Father    Cancer Sister     SOCIAL HISTORY: Social History   Socioeconomic History   Marital status: Divorced    Spouse name: Not on file   Number of children: 2   Years of education: 12   Highest education level: Not on file  Occupational History    Comment: retired Naval architect  Tobacco Use   Smoking status: Former   Smokeless tobacco: Never   Tobacco comments:    quit many years ago  Substance and Sexual Activity   Alcohol use: Not Currently    Comment: 02/16/2015 "last alcohol was in ~ 2012"   Drug use: No   Sexual activity: Not Currently  Other Topics Concern   Not on file  Social History Narrative   Lives with daughter    caffeine -coffee,  1 cup daily   Social Determinants of Health   Financial Resource Strain: Not on file  Food Insecurity: Not on file  Transportation Needs: Not on file  Physical Activity: Not on file  Stress: Not on file  Social Connections: Not on file  Intimate Partner Violence: Not on file   PHYSICAL EXAM  There were no vitals filed for this visit.   There is no height or weight on file to calculate BMI.  Generalized: Well developed, in no acute distress     02/27/2019   12:44 PM 09/12/2017    1:25 PM 05/31/2017    5:00 PM  MMSE - Mini Mental State Exam  Orientation to time 0 1 1  Orientation to Place 3 4 3   Registration 3 3 3   Attention/ Calculation 0 5 3  Recall 0 0 0  Language- name 2 objects 2 2 2   Language- repeat 1 1 1   Language- follow 3 step command 3 3 3   Language- read & follow direction 1 1 1   Write a sentence 0 1 1  Copy design 0 0 0  Total score 13 21 18     Neurological examination   Mentation: Masked face, rely on son in law  to provide history, oriented to his name, but not age, compliant with neurological examination, very nice cooperative, calm Cranial nerve II-XII: Pupils were equal round reactive to light. Extraocular movements were full, visual field were full on confrontational test. Facial sensation and strength were normal. Head turning and shoulder shrug  were normal and symmetric. Motor: Left more than right arm and leg rigidity, bradykinesia, there was no significant weakness Sensory: Intact to light touch Coordination: No dysmetria Reflexes: Hypoactive and symmetric Gait: He needs push-up to get up from seated position stands rather quickly, leaning forward, decreased arm swing, left worse than right, small stride  DIAGNOSTIC DATA (LABS, IMAGING, TESTING) - I reviewed patient records, labs, notes, testing and imaging myself where available.  Lab Results  Component Value Date   WBC 5.0 06/19/2022   HGB 14.9 06/19/2022   HCT 43.6 06/19/2022   MCV 89.2 06/19/2022   PLT 204 06/19/2022      Component Value Date/Time   NA 132 (L) 06/19/2022 0058   NA 143 05/09/2022 1512   K 4.2 06/19/2022 0058   CL 99 06/19/2022 0058   CO2 24 06/19/2022 0058   GLUCOSE 96 06/19/2022 0058   BUN 27 (H) 06/19/2022 0058   BUN 12 05/09/2022 1512   CREATININE 1.58 (H) 06/19/2022 0058   CALCIUM 8.7 (L) 06/19/2022 0058   PROT 7.0 06/19/2022 0058   PROT 6.9 05/09/2022 1512   ALBUMIN 3.3 (L) 06/19/2022 0058   ALBUMIN 4.5 05/09/2022 1512   AST 35 06/19/2022 0058   ALT 28 06/19/2022 0058   ALKPHOS 53 06/19/2022 0058   BILITOT 0.5 06/19/2022 0058   BILITOT 0.3 05/09/2022 1512   GFRNONAA 46 (L) 06/19/2022 0058   GFRAA 77 01/29/2020 0811   Lab Results  Component Value Date   CHOL 186 03/18/2012   HDL 72 03/18/2012   LDLCALC 105 (H) 03/18/2012   TRIG 45 02/22/2021   CHOLHDL 2.6 03/18/2012   Lab Results  Component Value Date   HGBA1C 5.6 02/16/2016   Lab Results   Component Value Date   VITAMINB12 402 02/16/2016   Lab Results  Component Value Date   TSH 3.153 06/15/2022    ASSESSMENT AND PLAN 72 y.o. year old male  1.  Parkinson's disease 2.  Dementia with behavioral issues 3.  Epilepsy  -Overall, doing fairly well, mood is under control with medication, no agitation, he does have occasional hallucinations that are mild.  He is cooperative.  Last seizure in December 2023. -Continue Keppra 750 mg twice daily, Depakote ER 500 mg, 2 tablets at bedtime for seizure prevention -Continue Sinemet 25/100 mg 1 tablet 4 times a day -Continue Risperdal 1 mg at night, no longer significant agitation -Call for seizure years, follow-up in 8 months or sooner if needed  Meds ordered this encounter  Medications   risperiDONE (RISPERDAL) 1 MG tablet    Sig: Take 1 tablet (1 mg total) by mouth at bedtime.    Dispense:  30 tablet    Refill:  11   levETIRAcetam (KEPPRA) 750 MG tablet    Sig: Take 1 tablet (750 mg total) by mouth 2 (two) times daily.    Dispense:  60 tablet    Refill:  11   divalproex (DEPAKOTE ER) 500 MG 24 hr tablet    Sig: Take 2 tablets (1,000 mg total) by mouth at bedtime.    Dispense:  180 tablet    Refill:  3   carbidopa-levodopa (SINEMET IR) 25-250 MG tablet  Sig: Take 1 tablet by mouth 4 (four) times daily.    Dispense:  120 tablet    Refill:  11   Margie Ege, Edrick Oh, [facility]  [facility] [address], [address] [place], [place] [address] [phone]

## 2022-11-13 DIAGNOSIS — F17200 Nicotine dependence, unspecified, uncomplicated: Secondary | ICD-10-CM | POA: Diagnosis not present

## 2022-11-13 DIAGNOSIS — E782 Mixed hyperlipidemia: Secondary | ICD-10-CM | POA: Diagnosis not present

## 2022-11-13 DIAGNOSIS — I25118 Atherosclerotic heart disease of native coronary artery with other forms of angina pectoris: Secondary | ICD-10-CM | POA: Diagnosis not present

## 2022-11-13 DIAGNOSIS — I10 Essential (primary) hypertension: Secondary | ICD-10-CM | POA: Diagnosis not present

## 2022-11-28 DIAGNOSIS — Z23 Encounter for immunization: Secondary | ICD-10-CM | POA: Diagnosis not present

## 2022-11-28 DIAGNOSIS — R7301 Impaired fasting glucose: Secondary | ICD-10-CM | POA: Diagnosis not present

## 2022-11-28 DIAGNOSIS — I10 Essential (primary) hypertension: Secondary | ICD-10-CM | POA: Diagnosis not present

## 2022-11-28 DIAGNOSIS — Z0000 Encounter for general adult medical examination without abnormal findings: Secondary | ICD-10-CM | POA: Diagnosis not present

## 2022-11-28 DIAGNOSIS — Z79899 Other long term (current) drug therapy: Secondary | ICD-10-CM | POA: Diagnosis not present

## 2022-11-28 DIAGNOSIS — E7800 Pure hypercholesterolemia, unspecified: Secondary | ICD-10-CM | POA: Diagnosis not present

## 2022-11-28 DIAGNOSIS — F03918 Unspecified dementia, unspecified severity, with other behavioral disturbance: Secondary | ICD-10-CM | POA: Diagnosis not present

## 2022-11-28 DIAGNOSIS — G20A1 Parkinson's disease without dyskinesia, without mention of fluctuations: Secondary | ICD-10-CM | POA: Diagnosis not present

## 2022-11-28 DIAGNOSIS — I2510 Atherosclerotic heart disease of native coronary artery without angina pectoris: Secondary | ICD-10-CM | POA: Diagnosis not present

## 2022-11-28 DIAGNOSIS — F331 Major depressive disorder, recurrent, moderate: Secondary | ICD-10-CM | POA: Diagnosis not present

## 2023-02-04 ENCOUNTER — Encounter (HOSPITAL_COMMUNITY): Payer: Self-pay

## 2023-02-04 ENCOUNTER — Other Ambulatory Visit: Payer: Self-pay

## 2023-02-04 ENCOUNTER — Emergency Department (HOSPITAL_COMMUNITY)
Admission: EM | Admit: 2023-02-04 | Discharge: 2023-02-04 | Disposition: A | Discharge status: Home / Self Care | Payer: Medicare HMO | Attending: Emergency Medicine | Admitting: Emergency Medicine

## 2023-02-04 ENCOUNTER — Emergency Department (HOSPITAL_COMMUNITY): Payer: Medicare HMO

## 2023-02-04 DIAGNOSIS — I2510 Atherosclerotic heart disease of native coronary artery without angina pectoris: Secondary | ICD-10-CM | POA: Diagnosis not present

## 2023-02-04 DIAGNOSIS — G20C Parkinsonism, unspecified: Secondary | ICD-10-CM | POA: Diagnosis not present

## 2023-02-04 DIAGNOSIS — Z8673 Personal history of transient ischemic attack (TIA), and cerebral infarction without residual deficits: Secondary | ICD-10-CM | POA: Insufficient documentation

## 2023-02-04 DIAGNOSIS — F0390 Unspecified dementia without behavioral disturbance: Secondary | ICD-10-CM | POA: Insufficient documentation

## 2023-02-04 DIAGNOSIS — I6782 Cerebral ischemia: Secondary | ICD-10-CM | POA: Diagnosis not present

## 2023-02-04 DIAGNOSIS — R55 Syncope and collapse: Secondary | ICD-10-CM | POA: Diagnosis not present

## 2023-02-04 DIAGNOSIS — R569 Unspecified convulsions: Secondary | ICD-10-CM | POA: Diagnosis not present

## 2023-02-04 DIAGNOSIS — I10 Essential (primary) hypertension: Secondary | ICD-10-CM | POA: Diagnosis not present

## 2023-02-04 DIAGNOSIS — Z7982 Long term (current) use of aspirin: Secondary | ICD-10-CM | POA: Diagnosis not present

## 2023-02-04 DIAGNOSIS — Z79899 Other long term (current) drug therapy: Secondary | ICD-10-CM | POA: Diagnosis not present

## 2023-02-04 DIAGNOSIS — G40909 Epilepsy, unspecified, not intractable, without status epilepticus: Secondary | ICD-10-CM | POA: Diagnosis not present

## 2023-02-04 DIAGNOSIS — R4182 Altered mental status, unspecified: Secondary | ICD-10-CM | POA: Diagnosis not present

## 2023-02-04 LAB — CBC WITH DIFFERENTIAL/PLATELET
Abs Immature Granulocytes: 0.01 10*3/uL (ref 0.00–0.07)
Basophils Absolute: 0 10*3/uL (ref 0.0–0.1)
Basophils Relative: 0 %
Eosinophils Absolute: 0.1 10*3/uL (ref 0.0–0.5)
Eosinophils Relative: 2 %
HCT: 44.4 % (ref 39.0–52.0)
Hemoglobin: 14.7 g/dL (ref 13.0–17.0)
Immature Granulocytes: 0 %
Lymphocytes Relative: 26 %
Lymphs Abs: 1.3 10*3/uL (ref 0.7–4.0)
MCH: 29.9 pg (ref 26.0–34.0)
MCHC: 33.1 g/dL (ref 30.0–36.0)
MCV: 90.4 fL (ref 80.0–100.0)
Monocytes Absolute: 0.5 10*3/uL (ref 0.1–1.0)
Monocytes Relative: 9 %
Neutro Abs: 3.2 10*3/uL (ref 1.7–7.7)
Neutrophils Relative %: 63 %
Platelets: 175 10*3/uL (ref 150–400)
RBC: 4.91 MIL/uL (ref 4.22–5.81)
RDW: 13.8 % (ref 11.5–15.5)
WBC: 5.1 10*3/uL (ref 4.0–10.5)
nRBC: 0 % (ref 0.0–0.2)

## 2023-02-04 LAB — BASIC METABOLIC PANEL
Anion gap: 9 (ref 5–15)
BUN: 13 mg/dL (ref 8–23)
CO2: 25 mmol/L (ref 22–32)
Calcium: 9 mg/dL (ref 8.9–10.3)
Chloride: 106 mmol/L (ref 98–111)
Creatinine, Ser: 1.27 mg/dL — ABNORMAL HIGH (ref 0.61–1.24)
GFR, Estimated: 60 mL/min — ABNORMAL LOW (ref >60)
Glucose, Bld: 118 mg/dL — ABNORMAL HIGH (ref 70–99)
Potassium: 3.9 mmol/L (ref 3.5–5.1)
Sodium: 140 mmol/L (ref 135–145)

## 2023-02-04 LAB — URINALYSIS, ROUTINE W REFLEX MICROSCOPIC
Bilirubin Urine: NEGATIVE
Glucose, UA: NEGATIVE mg/dL
Hgb urine dipstick: NEGATIVE
Ketones, ur: NEGATIVE mg/dL
Leukocytes,Ua: NEGATIVE
Nitrite: NEGATIVE
Protein, ur: NEGATIVE mg/dL
Specific Gravity, Urine: 1.017 (ref 1.005–1.030)
pH: 7 (ref 5.0–8.0)

## 2023-02-04 LAB — TROPONIN I (HIGH SENSITIVITY)
Troponin I (High Sensitivity): 5 ng/L (ref <18)
Troponin I (High Sensitivity): 5 ng/L (ref <18)

## 2023-02-04 NOTE — ED Triage Notes (Addendum)
PT BIB EMS for a syncopal episode this morning at the table, was out for a few minutes, was ambulatory on scene, per family, the patient is at baseline now. No complaints at this time.  BP 110/60 HR 72 CBG 145 Resp 18 96% RA 22 g Left hand

## 2023-02-04 NOTE — ED Provider Notes (Signed)
[place] EMERGENCY DEPARTMENT AT [facility] Provider Note   CSN: [id number] Arrival date & time: 02/04/23  4098     History  Chief Complaint  Patient presents with   Loss of Consciousness    Tyrone Noble is a 72 y.o. male with past medical history of hypertension, stroke, heart disease, dementia, coronary artery disease, seizures presenting to emergency room after he had a witnessed seizure by family while eating dinner at the table.  Patient reports reportedly lost consciousness and remained seated in the chair without falling.  Patient was like this for approximately 15 minutes with some residual confusion.  Family reports that he had some confusion and generalized weakness as he was getting into the ambulance but now they feel he is back to his normal baseline.  Patient denies any pain at this time.   Loss of Consciousness      Home Medications Prior to Admission medications   Medication Sig Start Date End Date Taking? Authorizing Provider  aspirin 81 MG chewable tablet Chew 1 tablet (81 mg total) by mouth daily. 02/17/15   Rinaldo Cloud, MD  atorvastatin (LIPITOR) 20 MG tablet Take 20 mg by mouth daily.  05/08/17   [provider]  carbidopa-levodopa (SINEMET IR) 25-250 MG tablet Take 1 tablet by mouth 4 (four) times daily. 11/08/22   Glean Salvo, NP  divalproex (DEPAKOTE ER) 500 MG 24 hr tablet Take 2 tablets (1,000 mg total) by mouth at bedtime. 11/08/22   Glean Salvo, NP  levETIRAcetam (KEPPRA) 750 MG tablet Take 1 tablet (750 mg total) by mouth 2 (two) times daily. 11/08/22   Glean Salvo, NP  losartan (COZAAR) 100 MG tablet Take 50 mg by mouth in the morning. 02/21/21   [provider]  metoprolol succinate (TOPROL-XL) 25 MG 24 hr tablet Take 25 mg by mouth daily. 11/19/15   [provider]  Multiple Vitamins-Minerals (CENTRUM SILVER PO) Take 1 tablet by mouth daily.    [provider]  risperiDONE (RISPERDAL) 1 MG  tablet Take 1 tablet (1 mg total) by mouth at bedtime. 11/08/22   Glean Salvo, NP      Allergies    Aricept Palma Holter hcl] and Penicillins    Review of Systems   Review of Systems  Cardiovascular:  Positive for syncope.    Physical Exam Updated Vital Signs BP 123/67   Pulse 67   Temp 97.7 F (36.5 C)   Resp 13   SpO2 100%  Physical Exam Vitals and nursing note reviewed.  Constitutional:      General: He is not in acute distress.    Appearance: He is not ill-appearing, toxic-appearing or diaphoretic.     Comments: Patient is overall well-appearing.  Patient is alert to self.  Able to follow basic commands.  Appears pleasantly demented.  HENT:     Head: Normocephalic and atraumatic.  Eyes:     General: No scleral icterus.    Conjunctiva/sclera: Conjunctivae normal.  Neck:     Comments: No midline tenderness of cervical spine, no step-off deformity. Able to move extremities without difficulty. Cardiovascular:     Rate and Rhythm: Normal rate and regular rhythm.     Pulses: Normal pulses.     Heart sounds: Normal heart sounds.  Pulmonary:     Effort: Pulmonary effort is normal. No respiratory distress.     Breath sounds: Normal breath sounds.  Abdominal:     General: Abdomen is flat. Bowel sounds are  normal.     Palpations: Abdomen is soft.     Tenderness: There is no abdominal tenderness.  Musculoskeletal:     Right lower leg: No edema.     Left lower leg: No edema.  Skin:    General: Skin is warm.     Findings: No lesion.  Neurological:     General: No focal deficit present.     Mental Status: He is alert and oriented to person, place, and time. Mental status is at baseline.     Cranial Nerves: No cranial nerve deficit.     Sensory: No sensory deficit.     Motor: No weakness.     Coordination: Coordination normal.     Gait: Gait normal.     Comments: Patient able to ambulate.     ED Results / Procedures / Treatments   Labs (all labs ordered are listed,  but only abnormal results are displayed) Labs Reviewed  BASIC METABOLIC PANEL - Abnormal; Notable for the following components:      Result Value   Glucose, Bld 118 (*)    Creatinine, Ser 1.27 (*)    GFR, Estimated 60 (*)    All other components within normal limits  CBC WITH DIFFERENTIAL/PLATELET  URINALYSIS, ROUTINE W REFLEX MICROSCOPIC  TROPONIN I (HIGH SENSITIVITY)  TROPONIN I (HIGH SENSITIVITY)    EKG EKG Interpretation Date/Time:  Sunday February 04 2023 09:34:47 EST Ventricular Rate:  68 PR Interval:  187 QRS Duration:  155 QT Interval:  439 QTC Calculation: 467 R Axis:   -82  Text Interpretation: Sinus rhythm RBBB and LAFB LVH by voltage Confirmed by Estelle June [phone]) on 02/04/2023 9:44:03 AM  Radiology CT Head Wo Contrast  Result Date: 02/04/2023 CLINICAL DATA:  Mental status change, unknown cause.  Syncope. EXAM: CT HEAD WITHOUT CONTRAST TECHNIQUE: Contiguous axial images were obtained from the base of the skull through the vertex without intravenous contrast. RADIATION DOSE REDUCTION: This exam was performed according to the departmental dose-optimization program which includes automated exposure control, adjustment of the mA and/or kV according to patient size and/or use of iterative reconstruction technique. COMPARISON:  Head CT 06/19/2022 and MRI 05/03/2021 FINDINGS: Brain: There is no evidence of an acute infarct, intracranial hemorrhage, mass, midline shift, or extra-axial fluid collection. There is mild cerebral atrophy. Hypodensities in the cerebral white matter are similar to the prior CT and are nonspecific but compatible with mild chronic small vessel ischemic disease. Chronic lacunar infarcts are again seen in the right centrum semiovale, right thalamus, and pons. Vascular: Calcified atherosclerosis at the skull base. No hyperdense vessel. Skull: No acute fracture or suspicious osseous lesion. Sinuses/Orbits: Visualized paranasal sinuses and mastoid air cells  are clear. Unremarkable orbits. Other: None. IMPRESSION: 1. No evidence of acute intracranial abnormality. 2. Chronic small vessel ischemic disease. Electronically Signed   By: Sebastian Ache M.D.   On: 02/04/2023 10:40    Procedures Procedures    Medications Ordered in ED Medications - No data to display  ED Course/ Medical Decision Making/ A&P                                 Medical Decision Making Amount and/or Complexity of Data Reviewed Labs: ordered. Radiology: ordered.   Jyl Heinz 72 y.o. presented today for seizure. Working DDx that I considered at this time includes, but not limited to, CVA/TIA, arrhythmia, vertigo, medication s/e, orthostatic hypotension, electrolyte abnormalities, dehydration,  URI, ACS, UTI, anemia   R/o DDx: These are considered less likely than current impression due to history of present illness, physical exam, lab/imaging findings   Review of prior external notes: 11/08/2022  Pmhx: Alcoholism, seizure, Parkinson's, hyperlipidemia, hypertension  Unique Tests and My Interpretation:  CBC with differential: No leukocytosis, no anemia BMP without obvious electrolyte abnormality, creatinine 1.27 Trop: Within normal limits, repeat within normal limits  UA: Negative nitrates negative leukocytes   EKG: Rate, rhythm, axis, intervals all examined: sinus, RBBB   Imaging:  CT of head: no acute intracranial abnormality noted   Problem List / ED Course / Critical interventions / Medication management  Patient reporting to emergency room after having witnessed seizure by family this morning, some residual confusion after the seizure.  Family is unsure of how long this lasted as he was brought to emergency room by EMS however they report by the time they arrived to emergency room he was acting back at his baseline.  No witnessed fall during seizure.  Patient has no focal deficits on exam.  Appears atraumatic.  Head CT does not show any acute  intercranial abnormality.  Labs obtained without any significant electrolyte abnormality, no leukocytosis and no elevated troponin. Concern for breakthrough seizure, family at bedside reports patient has close follow-up with neurology, report no change in medications, report no missed doses.  Spoke with ED attending provider about patient's workup and presentation prior to discharge.  I will discharge patient without changes to home medications, encouraged close neurology follow-up, family agree.  Patient stable for discharge. Reevaluation of this patient, family reports he continues to act at baseline, hemodynamically stable. Patients vitals assessed. Upon arrival patient is  hemodynamically stable.  I have reviewed the patients home medicines and have made adjustments as needed  Consult: None   Plan:  F/u w/ PCP in 2-3d to ensure resolution of sx, family agrees to call neurology for follow up.  Patient was given return precautions. Patient stable for discharge at this time.  Patient educated on current sx/dx and verbalized understanding of plan. Return to ER w/ new or worsening sx.          Final Clinical Impression(s) / ED Diagnoses Final diagnoses:  Seizure [facility])    Rx / DC Orders ED Discharge Orders     None         Smitty Knudsen, PA-C 02/08/23 1524    Royanne Foots, DO 02/09/23 [phone]

## 2023-02-04 NOTE — Discharge Instructions (Addendum)
You were seen in the emergency room for seizure.  Your lab work and imaging came back reassuring.  Please continue to take your seizure medications and follow-up with neurology for breakthrough seizure.  Please return to emergency room with any new or worsening symptoms.

## 2023-02-05 ENCOUNTER — Telehealth: Payer: Self-pay | Admitting: Neurology

## 2023-02-05 DIAGNOSIS — G40909 Epilepsy, unspecified, not intractable, without status epilepticus: Secondary | ICD-10-CM

## 2023-02-05 NOTE — Telephone Encounter (Signed)
Returned call to patients daughter, no answer. Left message to return call.

## 2023-02-05 NOTE — Telephone Encounter (Signed)
Daughter reports pt was recently in the hospital for a seizure.  She reports the hospital made no med changes.  Daughter would like a call to discuss if there would be any suggested changes by Dr Sharol Roussel, NP

## 2023-02-06 NOTE — Telephone Encounter (Signed)
ER note is not completed for review yet.  Please discuss with family if they felt he truly had a real seizure.  Please make sure he has medication compliance.  CT head was unrevealing.  Labs are unremarkable, negative for UTI.  Supposed to be taking Depakote ER 500 mg 2 tablets at bedtime, Keppra 750 mg twice daily.  If possible, lets check blood levels of the medications, can they bring him this week for labs?  Was he late for any of his medications? How has his mood been? Thanks

## 2023-02-06 NOTE — Telephone Encounter (Signed)
Pt daughter returned phone call, would like a call back.

## 2023-02-06 NOTE — Telephone Encounter (Signed)
Called and spoke with Daughter, who confirmed they were in the ER recently and had imaging completed that confirmed no stroke happened, but stated ER dr. Edilia Bo it was a seizure. She confirmed prescribed medications and dosing for depakote and keppra. She states he has not and did not miss any doses of either medications, no noticeable changes in mood but some slight changes in memory over time. I spoke with her about labs and she reports they will come by in the morning to have them drawn. She had no further questions at this time and was grateful for the call.

## 2023-02-06 NOTE — Addendum Note (Signed)
Addended by: Glean Salvo on: 02/06/2023 03:06 PM   Modules accepted: Orders

## 2023-02-08 ENCOUNTER — Other Ambulatory Visit (INDEPENDENT_AMBULATORY_CARE_PROVIDER_SITE_OTHER): Payer: Self-pay

## 2023-02-08 DIAGNOSIS — G40909 Epilepsy, unspecified, not intractable, without status epilepticus: Secondary | ICD-10-CM | POA: Diagnosis not present

## 2023-02-08 DIAGNOSIS — Z0289 Encounter for other administrative examinations: Secondary | ICD-10-CM

## 2023-02-09 LAB — VALPROIC ACID LEVEL: Valproic Acid Lvl: 83 ug/mL (ref 50–100)

## 2023-02-09 LAB — LEVETIRACETAM LEVEL: Levetiracetam Lvl: 43.3 ug/mL — ABNORMAL HIGH (ref 10.0–40.0)

## 2023-02-10 ENCOUNTER — Telehealth: Payer: Self-pay | Admitting: Neurology

## 2023-02-10 NOTE — Telephone Encounter (Signed)
I tried to call the patient's daughter twice (on Saturday) to review results. I left her a message. Keppra level was mildly high 43.3, suspect this may be due to timing of the dosing. Depakote level was 83. We can increase Keppra to max of 1000 mg twice daily based on kidney function. Other options we may increase Depakote by adding 250 mg dosing onto evening. Can we try to reach her on Monday?

## 2023-02-12 ENCOUNTER — Telehealth: Payer: Self-pay | Admitting: Neurology

## 2023-02-12 DIAGNOSIS — G40909 Epilepsy, unspecified, not intractable, without status epilepticus: Secondary | ICD-10-CM

## 2023-02-12 MED ORDER — LEVETIRACETAM 1000 MG PO TABS: 1000.0000 mg | ORAL_TABLET | Freq: Two times a day (BID) | ORAL | 1 refills | Status: DC

## 2023-02-12 NOTE — Telephone Encounter (Signed)
Paperwork placed in providers office for her return

## 2023-02-12 NOTE — Addendum Note (Signed)
Addended by: Glean Salvo on: 02/12/2023 09:41 AM   Modules accepted: Orders

## 2023-02-12 NOTE — Telephone Encounter (Signed)
Called and LVM for Daughter

## 2023-02-12 NOTE — Telephone Encounter (Signed)
I called his son in law. Reports last week episode he was sitting at table, getting ready to eat, slumped over at the table for about 15 minutes, he went limp.  Describes this is typical seizure event.  Reports his blood sugar was fine. His mood was been good.   Came for labs last week, had just taken seizure medication, Keppra level was slightly high at 43.4, suspect this was due to timing of medication and blood draw.  Will increase Keppra 1000 mg twice daily.  This will be max dosing according to creatinine clearance (55) (500 mg-1 gram every 12 hours).  He will continue Depakote ER 500 mg 2 tablets at bedtime.  I will order 48-hour ambulatory EEG to check for subclinical seizures.  He had an EEG in March 2024 routine showing diffuse encephalopathy, no seizures were seen.  Meds ordered this encounter  Medications   levETIRAcetam (KEPPRA) 1000 MG tablet    Sig: Take 1 tablet (1,000 mg total) by mouth 2 (two) times daily.    Dispense:  180 tablet    Refill:  1

## 2023-02-26 DIAGNOSIS — E782 Mixed hyperlipidemia: Secondary | ICD-10-CM | POA: Diagnosis not present

## 2023-02-26 DIAGNOSIS — I10 Essential (primary) hypertension: Secondary | ICD-10-CM | POA: Diagnosis not present

## 2023-02-26 DIAGNOSIS — G40909 Epilepsy, unspecified, not intractable, without status epilepticus: Secondary | ICD-10-CM | POA: Diagnosis not present

## 2023-02-26 DIAGNOSIS — I25118 Atherosclerotic heart disease of native coronary artery with other forms of angina pectoris: Secondary | ICD-10-CM | POA: Diagnosis not present

## 2023-03-30 DIAGNOSIS — G40909 Epilepsy, unspecified, not intractable, without status epilepticus: Secondary | ICD-10-CM | POA: Diagnosis not present

## 2023-03-30 DIAGNOSIS — R410 Disorientation, unspecified: Secondary | ICD-10-CM | POA: Diagnosis not present

## 2023-03-30 DIAGNOSIS — Z8669 Personal history of other diseases of the nervous system and sense organs: Secondary | ICD-10-CM | POA: Diagnosis not present

## 2023-03-30 DIAGNOSIS — R55 Syncope and collapse: Secondary | ICD-10-CM | POA: Diagnosis not present

## 2023-04-04 ENCOUNTER — Emergency Department (HOSPITAL_COMMUNITY): Payer: Medicare HMO

## 2023-04-04 ENCOUNTER — Encounter (HOSPITAL_COMMUNITY): Payer: Self-pay

## 2023-04-04 ENCOUNTER — Other Ambulatory Visit: Payer: Self-pay

## 2023-04-04 ENCOUNTER — Emergency Department (HOSPITAL_COMMUNITY)
Admission: EM | Admit: 2023-04-04 | Discharge: 2023-04-04 | Disposition: A | Discharge status: Home / Self Care | Payer: Medicare HMO | Attending: Emergency Medicine | Admitting: Emergency Medicine

## 2023-04-04 DIAGNOSIS — Z7982 Long term (current) use of aspirin: Secondary | ICD-10-CM | POA: Insufficient documentation

## 2023-04-04 DIAGNOSIS — I2510 Atherosclerotic heart disease of native coronary artery without angina pectoris: Secondary | ICD-10-CM | POA: Insufficient documentation

## 2023-04-04 DIAGNOSIS — I10 Essential (primary) hypertension: Secondary | ICD-10-CM | POA: Insufficient documentation

## 2023-04-04 DIAGNOSIS — Z8673 Personal history of transient ischemic attack (TIA), and cerebral infarction without residual deficits: Secondary | ICD-10-CM | POA: Insufficient documentation

## 2023-04-04 DIAGNOSIS — G20A1 Parkinson's disease without dyskinesia, without mention of fluctuations: Secondary | ICD-10-CM | POA: Insufficient documentation

## 2023-04-04 DIAGNOSIS — E860 Dehydration: Secondary | ICD-10-CM | POA: Diagnosis not present

## 2023-04-04 DIAGNOSIS — I6782 Cerebral ischemia: Secondary | ICD-10-CM | POA: Diagnosis not present

## 2023-04-04 DIAGNOSIS — Z1152 Encounter for screening for COVID-19: Secondary | ICD-10-CM | POA: Diagnosis not present

## 2023-04-04 DIAGNOSIS — R531 Weakness: Secondary | ICD-10-CM | POA: Diagnosis not present

## 2023-04-04 DIAGNOSIS — R4182 Altered mental status, unspecified: Secondary | ICD-10-CM | POA: Diagnosis present

## 2023-04-04 DIAGNOSIS — F0390 Unspecified dementia without behavioral disturbance: Secondary | ICD-10-CM | POA: Diagnosis not present

## 2023-04-04 DIAGNOSIS — F918 Other conduct disorders: Secondary | ICD-10-CM | POA: Diagnosis not present

## 2023-04-04 DIAGNOSIS — F0280 Dementia in other diseases classified elsewhere without behavioral disturbance: Secondary | ICD-10-CM | POA: Diagnosis not present

## 2023-04-04 DIAGNOSIS — Z79899 Other long term (current) drug therapy: Secondary | ICD-10-CM | POA: Diagnosis not present

## 2023-04-04 LAB — URINALYSIS, ROUTINE W REFLEX MICROSCOPIC
Bilirubin Urine: NEGATIVE
Glucose, UA: NEGATIVE mg/dL
Hgb urine dipstick: NEGATIVE
Ketones, ur: 5 mg/dL — AB
Leukocytes,Ua: NEGATIVE
Nitrite: NEGATIVE
Protein, ur: NEGATIVE mg/dL
Specific Gravity, Urine: 1.009 (ref 1.005–1.030)
pH: 6 (ref 5.0–8.0)

## 2023-04-04 LAB — CBC WITH DIFFERENTIAL/PLATELET
Abs Immature Granulocytes: 0.02 10*3/uL (ref 0.00–0.07)
Basophils Absolute: 0 10*3/uL (ref 0.0–0.1)
Basophils Relative: 0 %
Eosinophils Absolute: 0.1 10*3/uL (ref 0.0–0.5)
Eosinophils Relative: 1 %
HCT: 43.4 % (ref 39.0–52.0)
Hemoglobin: 14.2 g/dL (ref 13.0–17.0)
Immature Granulocytes: 0 %
Lymphocytes Relative: 25 %
Lymphs Abs: 1.8 10*3/uL (ref 0.7–4.0)
MCH: 29.9 pg (ref 26.0–34.0)
MCHC: 32.7 g/dL (ref 30.0–36.0)
MCV: 91.4 fL (ref 80.0–100.0)
Monocytes Absolute: 0.7 10*3/uL (ref 0.1–1.0)
Monocytes Relative: 10 %
Neutro Abs: 4.6 10*3/uL (ref 1.7–7.7)
Neutrophils Relative %: 64 %
Platelets: 168 10*3/uL (ref 150–400)
RBC: 4.75 MIL/uL (ref 4.22–5.81)
RDW: 14.4 % (ref 11.5–15.5)
WBC: 7.2 10*3/uL (ref 4.0–10.5)
nRBC: 0 % (ref 0.0–0.2)

## 2023-04-04 LAB — RESP PANEL BY RT-PCR (RSV, FLU A&B, COVID)  RVPGX2
Influenza A by PCR: NEGATIVE
Influenza B by PCR: NEGATIVE
Resp Syncytial Virus by PCR: NEGATIVE
SARS Coronavirus 2 by RT PCR: NEGATIVE

## 2023-04-04 LAB — COMPREHENSIVE METABOLIC PANEL
ALT: 7 U/L (ref 0–44)
AST: 16 U/L (ref 15–41)
Albumin: 3.4 g/dL — ABNORMAL LOW (ref 3.5–5.0)
Alkaline Phosphatase: 66 U/L (ref 38–126)
Anion gap: 8 (ref 5–15)
BUN: 15 mg/dL (ref 8–23)
CO2: 24 mmol/L (ref 22–32)
Calcium: 8.9 mg/dL (ref 8.9–10.3)
Chloride: 108 mmol/L (ref 98–111)
Creatinine, Ser: 1.4 mg/dL — ABNORMAL HIGH (ref 0.61–1.24)
GFR, Estimated: 53 mL/min — ABNORMAL LOW (ref >60)
Glucose, Bld: 96 mg/dL (ref 70–99)
Potassium: 4.4 mmol/L (ref 3.5–5.1)
Sodium: 140 mmol/L (ref 135–145)
Total Bilirubin: 0.7 mg/dL (ref 0.0–1.2)
Total Protein: 6.4 g/dL — ABNORMAL LOW (ref 6.5–8.1)

## 2023-04-04 LAB — AMMONIA: Ammonia: 11 umol/L (ref 9–35)

## 2023-04-04 NOTE — ED Provider Notes (Signed)
[place] EMERGENCY DEPARTMENT AT [facility] Provider Note   CSN: [id number] Arrival date & time: 04/04/23  0957     History  Chief Complaint  Patient presents with   Altered Mental Status    Tyrone Noble is a 73 y.o. male.   Altered Mental Status    Patient presented to the ED for evaluation of altered mental status.  Patient has history of hypertension, coronary artery disease, stroke, depression, memory loss, dementia, seizures.  Patient was at the adult daycare today and apparently not acting like himself.  Patient was not speaking much.  Patient complained of facility of having pain on his sides however patient in the ED denies any complaints of pain to me.  He states he is just not feeling well.  He denies anything hurting.  He denies feeling short of breath.  He denies any cough.  No known vomiting or diarrhea  Home Medications Prior to Admission medications   Medication Sig Start Date End Date Taking? Authorizing Provider  aspirin 81 MG chewable tablet Chew 1 tablet (81 mg total) by mouth daily. 02/17/15   Rinaldo Cloud, MD  atorvastatin (LIPITOR) 20 MG tablet Take 20 mg by mouth daily.  05/08/17   [provider]  carbidopa-levodopa (SINEMET IR) 25-250 MG tablet Take 1 tablet by mouth 4 (four) times daily. 11/08/22   Glean Salvo, NP  divalproex (DEPAKOTE ER) 500 MG 24 hr tablet Take 2 tablets (1,000 mg total) by mouth at bedtime. 11/08/22   Glean Salvo, NP  levETIRAcetam (KEPPRA) 1000 MG tablet Take 1 tablet (1,000 mg total) by mouth 2 (two) times daily. 02/12/23   Glean Salvo, NP  losartan (COZAAR) 100 MG tablet Take 50 mg by mouth in the morning. 02/21/21   [provider]  metoprolol succinate (TOPROL-XL) 25 MG 24 hr tablet Take 25 mg by mouth daily. 11/19/15   [provider]  Multiple Vitamins-Minerals (CENTRUM SILVER PO) Take 1 tablet by mouth daily.    [provider]  risperiDONE (RISPERDAL) 1 MG tablet Take  1 tablet (1 mg total) by mouth at bedtime. 11/08/22   Glean Salvo, NP      Allergies    Aricept Palma Holter hcl] and Penicillins    Review of Systems   Review of Systems  Physical Exam Updated Vital Signs Ht 1.702 m (5\' 7" )   Wt 75 kg   BMI 25.90 kg/m  Physical Exam Vitals and nursing note reviewed.  Constitutional:      Appearance: He is well-developed. He is not diaphoretic.  HENT:     Head: Normocephalic and atraumatic.     Right Ear: External ear normal.     Left Ear: External ear normal.  Eyes:     General: No scleral icterus.       Right eye: No discharge.        Left eye: No discharge.     Conjunctiva/sclera: Conjunctivae normal.  Neck:     Trachea: No tracheal deviation.  Cardiovascular:     Rate and Rhythm: Normal rate and regular rhythm.  Pulmonary:     Effort: Pulmonary effort is normal. No respiratory distress.     Breath sounds: Normal breath sounds. No stridor. No wheezing or rales.  Abdominal:     General: Bowel sounds are normal. There is no distension.     Palpations: Abdomen is soft.     Tenderness: There is no abdominal tenderness. There is no guarding or rebound.  Musculoskeletal:        General: No tenderness or deformity.     Cervical back: Neck supple.  Skin:    General: Skin is warm and dry.     Findings: No rash.  Neurological:     General: No focal deficit present.     Mental Status: He is alert.     Cranial Nerves: No cranial nerve deficit, dysarthria or facial asymmetry.     Sensory: No sensory deficit.     Motor: Weakness present. No abnormal muscle tone or seizure activity.     Coordination: Coordination normal.     Comments: Patient answers questions follows commands, with equal grip strength bilaterally, able to lift his legs off the bed bilaterally, generalized weakness  Psychiatric:        Mood and Affect: Mood normal.     ED Results / Procedures / Treatments   Labs (all labs ordered are listed, but only abnormal results  are displayed) Labs Reviewed - No data to display  EKG None  Radiology No results found.  Procedures Procedures    Medications Ordered in ED Medications - No data to display  ED Course/ Medical Decision Making/ A&P Clinical Course as of 04/05/23 0705  Wed Apr 04, 2023  1305 Ammonia level normal.  Metabolic panel shows increased creatinine similar to previous.  CBC normal. [JK]  1306 CT without acute finding [JK]  1306 X-ray without acute finding [JK]  1605 Pt answering questions appropriately [JK]    Clinical Course User Index [JK] Linwood Dibbles, MD                                 Medical Decision Making Amount and/or Complexity of Data Reviewed Labs: ordered. Decision-making details documented in ED Course. Radiology: ordered and independent interpretation performed.   Patient presented to the ED for evaluation of change in mental status.  Patient known to have significant dementia at baseline.  ED workup ordered to rule out any signs of acute infection or metabolic abnormality.  ED workup overall reassuring.  Patient appears [place] at his baseline.  UA was pending at shift change.  Anticipate discharge.  Case turned over to Dr. Wallace Cullens        Final Clinical Impression(s) / ED Diagnoses Final diagnoses:  None    Rx / DC Orders ED Discharge Orders     None         Linwood Dibbles, MD 04/05/23 [phone]

## 2023-04-04 NOTE — Discharge Instructions (Addendum)
 It was a pleasure caring for you today in the emergency department.  Please return to the emergency department for any worsening or worrisome symptoms.

## 2023-04-04 NOTE — ED Provider Notes (Signed)
  Provider Note MRN:  [id number]  Arrival date & time: 04/04/23    ED Course and Medical Decision Making  Assumed care from [place] at shift change.  See note from prior team for complete details, in brief:  74 yo male Here w/ ams W/u stable RVP pending  Plan per prior physician f/u swab and then plan for dc  RVP was negative.  Patient feels better, back to baseline per family.  Stable for discharge back to facility per plan from outgoing team.   His creatinine is mildly elevated compared to prior but very similar.  Encourage rehydration.  He is tolerant p.o. without difficulty.  The patient improved significantly and was discharged in stable condition. Detailed discussions were had with the patient regarding current findings, and need for close f/u with PCP or on call doctor. The patient has been instructed to return immediately if the symptoms worsen in any way for re-evaluation. Patient verbalized understanding and is in agreement with current care plan. All questions answered prior to discharge.    Procedures  Final Clinical Impressions(s) / ED Diagnoses     ICD-10-CM   1. Dementia due to Parkinson's disease, unspecified dementia severity, unspecified whether behavioral, psychotic, or mood disturbance or anxiety (HCC)  G20.A1    F02.80     2. Mild dehydration  E86.0       ED Discharge Orders     None         Discharge Instructions      It was a pleasure caring for you today in the emergency department.  Please return to the emergency department for any worsening or worrisome symptoms.          Teddi Favors, DO 04/04/23 1705

## 2023-04-04 NOTE — ED Triage Notes (Signed)
 Pt coming in from adult daycare, adult enrichment center. Pt is not acting like himself . Pt is reluctant to speak. Pt complaining of pain in both sides. Pt has dementia at baseline. Pt has a history of parkinson. Pt family reports that this is not abnormal for him to become altered in this way then come back to himself.   Ems vitals  154/88  Hr 70  98% ra Rr 16  Cbg 138

## 2023-04-05 ENCOUNTER — Telehealth: Payer: Self-pay | Admitting: Neurology

## 2023-04-05 DIAGNOSIS — F0390 Unspecified dementia without behavioral disturbance: Secondary | ICD-10-CM | POA: Diagnosis not present

## 2023-04-05 NOTE — Telephone Encounter (Signed)
Call to patients son in law, Beckie Busing, who reports the last 2 days patient has had seizures, no convulsions, just starring off and slumped over and unresponsive. He reports medication compliance and denies current seizure triggers. Over stimulation is a trigger but he has not experienced it. Advised I would send to Dr. Teresa Coombs for review. Medication list reviewed and accurate

## 2023-04-05 NOTE — Telephone Encounter (Signed)
Pt's son in law(on DPR) is asking for a call to discuss an increase in recent seizure activity.  He says there has been 2 recent seizures at the memory care center, he would like a call to discuss what can be suggested.  Pt has been to urgent care, has been told there is nothing they can do.

## 2023-04-06 ENCOUNTER — Emergency Department (HOSPITAL_COMMUNITY)
Admission: EM | Admit: 2023-04-06 | Discharge: 2023-04-12 | Disposition: A | Discharge status: Skilled Nursing Facility | Payer: Medicare HMO | Attending: Emergency Medicine | Admitting: Emergency Medicine

## 2023-04-06 ENCOUNTER — Other Ambulatory Visit: Payer: Self-pay

## 2023-04-06 ENCOUNTER — Emergency Department (HOSPITAL_COMMUNITY): Payer: Medicare HMO

## 2023-04-06 ENCOUNTER — Encounter (HOSPITAL_COMMUNITY): Payer: Self-pay

## 2023-04-06 DIAGNOSIS — Z79899 Other long term (current) drug therapy: Secondary | ICD-10-CM | POA: Insufficient documentation

## 2023-04-06 DIAGNOSIS — R531 Weakness: Secondary | ICD-10-CM | POA: Diagnosis present

## 2023-04-06 DIAGNOSIS — F0280 Dementia in other diseases classified elsewhere without behavioral disturbance: Secondary | ICD-10-CM | POA: Diagnosis not present

## 2023-04-06 DIAGNOSIS — Z8673 Personal history of transient ischemic attack (TIA), and cerebral infarction without residual deficits: Secondary | ICD-10-CM | POA: Diagnosis not present

## 2023-04-06 DIAGNOSIS — Z20822 Contact with and (suspected) exposure to covid-19: Secondary | ICD-10-CM | POA: Insufficient documentation

## 2023-04-06 DIAGNOSIS — I6789 Other cerebrovascular disease: Secondary | ICD-10-CM | POA: Diagnosis not present

## 2023-04-06 DIAGNOSIS — I10 Essential (primary) hypertension: Secondary | ICD-10-CM | POA: Diagnosis not present

## 2023-04-06 DIAGNOSIS — G20A1 Parkinson's disease without dyskinesia, without mention of fluctuations: Secondary | ICD-10-CM | POA: Diagnosis not present

## 2023-04-06 DIAGNOSIS — I6782 Cerebral ischemia: Secondary | ICD-10-CM | POA: Diagnosis not present

## 2023-04-06 DIAGNOSIS — R059 Cough, unspecified: Secondary | ICD-10-CM | POA: Diagnosis present

## 2023-04-06 DIAGNOSIS — I2510 Atherosclerotic heart disease of native coronary artery without angina pectoris: Secondary | ICD-10-CM | POA: Insufficient documentation

## 2023-04-06 DIAGNOSIS — G40909 Epilepsy, unspecified, not intractable, without status epilepticus: Secondary | ICD-10-CM | POA: Insufficient documentation

## 2023-04-06 DIAGNOSIS — R569 Unspecified convulsions: Secondary | ICD-10-CM | POA: Diagnosis present

## 2023-04-06 LAB — RESP PANEL BY RT-PCR (RSV, FLU A&B, COVID)  RVPGX2
Influenza A by PCR: NEGATIVE
Influenza B by PCR: NEGATIVE
Resp Syncytial Virus by PCR: NEGATIVE
SARS Coronavirus 2 by RT PCR: NEGATIVE

## 2023-04-06 LAB — URINALYSIS, W/ REFLEX TO CULTURE (INFECTION SUSPECTED)
Bacteria, UA: NONE SEEN
Bilirubin Urine: NEGATIVE
Glucose, UA: NEGATIVE mg/dL
Hgb urine dipstick: NEGATIVE
Ketones, ur: 5 mg/dL — AB
Leukocytes,Ua: NEGATIVE
Nitrite: NEGATIVE
Protein, ur: NEGATIVE mg/dL
Specific Gravity, Urine: 1.02 (ref 1.005–1.030)
pH: 6 (ref 5.0–8.0)

## 2023-04-06 LAB — AMMONIA: Ammonia: 13 umol/L (ref 9–35)

## 2023-04-06 LAB — CBC WITH DIFFERENTIAL/PLATELET
Abs Immature Granulocytes: 0.01 10*3/uL (ref 0.00–0.07)
Basophils Absolute: 0 10*3/uL (ref 0.0–0.1)
Basophils Relative: 0 %
Eosinophils Absolute: 0.1 10*3/uL (ref 0.0–0.5)
Eosinophils Relative: 1 %
HCT: 41.5 % (ref 39.0–52.0)
Hemoglobin: 13.7 g/dL (ref 13.0–17.0)
Immature Granulocytes: 0 %
Lymphocytes Relative: 39 %
Lymphs Abs: 2.8 10*3/uL (ref 0.7–4.0)
MCH: 29.9 pg (ref 26.0–34.0)
MCHC: 33 g/dL (ref 30.0–36.0)
MCV: 90.6 fL (ref 80.0–100.0)
Monocytes Absolute: 0.8 10*3/uL (ref 0.1–1.0)
Monocytes Relative: 11 %
Neutro Abs: 3.4 10*3/uL (ref 1.7–7.7)
Neutrophils Relative %: 49 %
Platelets: 188 10*3/uL (ref 150–400)
RBC: 4.58 MIL/uL (ref 4.22–5.81)
RDW: 14.4 % (ref 11.5–15.5)
WBC: 7.2 10*3/uL (ref 4.0–10.5)
nRBC: 0 % (ref 0.0–0.2)

## 2023-04-06 LAB — COMPREHENSIVE METABOLIC PANEL
ALT: 15 U/L (ref 0–44)
AST: 14 U/L — ABNORMAL LOW (ref 15–41)
Albumin: 3.2 g/dL — ABNORMAL LOW (ref 3.5–5.0)
Alkaline Phosphatase: 54 U/L (ref 38–126)
Anion gap: 7 (ref 5–15)
BUN: 14 mg/dL (ref 8–23)
CO2: 26 mmol/L (ref 22–32)
Calcium: 8.7 mg/dL — ABNORMAL LOW (ref 8.9–10.3)
Chloride: 106 mmol/L (ref 98–111)
Creatinine, Ser: 1.29 mg/dL — ABNORMAL HIGH (ref 0.61–1.24)
GFR, Estimated: 59 mL/min — ABNORMAL LOW (ref >60)
Glucose, Bld: 93 mg/dL (ref 70–99)
Potassium: 4.3 mmol/L (ref 3.5–5.1)
Sodium: 139 mmol/L (ref 135–145)
Total Bilirubin: 0.6 mg/dL (ref 0.0–1.2)
Total Protein: 6.3 g/dL — ABNORMAL LOW (ref 6.5–8.1)

## 2023-04-06 LAB — TSH: TSH: 1.526 u[IU]/mL (ref 0.350–4.500)

## 2023-04-06 LAB — MAGNESIUM: Magnesium: 2.2 mg/dL (ref 1.7–2.4)

## 2023-04-06 LAB — VALPROIC ACID LEVEL: Valproic Acid Lvl: 68 ug/mL (ref 50.0–100.0)

## 2023-04-06 MED ORDER — ATORVASTATIN CALCIUM 10 MG PO TABS
20.0000 mg | ORAL_TABLET | Freq: Every day | ORAL | Status: DC
  Administered 2023-04-07 – 2023-04-11 (×5): 20 mg via ORAL
  Filled 2023-04-06 (×6): qty 2

## 2023-04-06 MED ORDER — METOPROLOL SUCCINATE ER 25 MG PO TB24
25.0000 mg | ORAL_TABLET | Freq: Every day | ORAL | Status: DC
  Administered 2023-04-07 – 2023-04-11 (×5): 25 mg via ORAL
  Filled 2023-04-06 (×6): qty 1

## 2023-04-06 MED ORDER — LOSARTAN POTASSIUM 50 MG PO TABS
50.0000 mg | ORAL_TABLET | Freq: Every morning | ORAL | Status: DC
  Administered 2023-04-07 – 2023-04-11 (×5): 50 mg via ORAL
  Filled 2023-04-06 (×6): qty 1

## 2023-04-06 MED ORDER — RISPERIDONE 1 MG PO TABS
1.0000 mg | ORAL_TABLET | Freq: Every day | ORAL | Status: DC
  Administered 2023-04-07 – 2023-04-11 (×6): 1 mg via ORAL
  Filled 2023-04-06 (×6): qty 1

## 2023-04-06 MED ORDER — LEVETIRACETAM 750 MG PO TABS
1250.0000 mg | ORAL_TABLET | Freq: Two times a day (BID) | ORAL | Status: DC
  Administered 2023-04-07 – 2023-04-11 (×11): 1250 mg via ORAL
  Filled 2023-04-06 (×8): qty 1
  Filled 2023-04-06: qty 3
  Filled 2023-04-06 (×4): qty 1

## 2023-04-06 MED ORDER — DIVALPROEX SODIUM ER 500 MG PO TB24
1000.0000 mg | ORAL_TABLET | Freq: Every day | ORAL | Status: DC
  Administered 2023-04-07 – 2023-04-11 (×6): 1000 mg via ORAL
  Filled 2023-04-06 (×6): qty 2

## 2023-04-06 MED ORDER — CARBIDOPA-LEVODOPA 25-250 MG PO TABS
1.0000 | ORAL_TABLET | Freq: Four times a day (QID) | ORAL | Status: DC
  Administered 2023-04-07 – 2023-04-11 (×20): 1 via ORAL
  Filled 2023-04-06 (×28): qty 1

## 2023-04-06 NOTE — TOC Initial Note (Signed)
Transition of Care [facility]) - Initial/Assessment Note    Patient Details  Name: Tyrone Noble MRN: [id number] Date of Birth: 24-Jul-1950  [facility] CM/SW Contact:    Carmina Miller, LCSWA Phone Number: 04/06/2023, 10:50 PM  Clinical Narrative:                  CSW received consult for SNF, will await PT recs.        Patient Goals and CMS Choice            Expected Discharge Plan and Services                                              Prior Living Arrangements/Services                       Activities of Daily Living      Permission Sought/Granted                  Emotional Assessment              Admission diagnosis:  Altered mental status Patient Active Problem List   Diagnosis Date Noted   COVID-19 06/15/2022   Acute encephalopathy 02/22/2021   Failure to thrive in adult    COVID-19 virus infection 03/19/2020   Generalized weakness 03/19/2020   Essential hypertension 03/19/2020   Seizure disorder (HCC) 03/19/2020   Parkinsonism (HCC) 02/26/2018   Therapeutic drug monitoring 09/12/2017   Seizures (HCC) 05/31/2017   Dementia without behavioral disturbance (HCC) 05/31/2017   New-onset angina (HCC) 02/16/2015   HYPERLIPIDEMIA 12/13/2009   ALCOHOLISM 07/07/2009   CALLUS, TOE 01/03/2007   DEPRESSION, MAJOR, RECURRENT 05/17/2006   TOBACCO DEPENDENCE 05/17/2006   HYPERTENSION, BENIGN SYSTEMIC 05/17/2006   HEMORRHOIDS, NOS 05/17/2006   GASTROESOPHAGEAL REFLUX, NO ESOPHAGITIS 05/17/2006   LEG PAIN OR KNEE PAIN 05/17/2006   PCP:  Darrow Bussing, MD Pharmacy:   [facility] [phone] [place], [place] - [address] [place] [address] Phone: [phone] Fax: [phone]     Social Drivers of Health (SDOH) Social History: SDOH Screenings   Tobacco Use: Medium Risk (04/06/2023)   SDOH Interventions:     Readmission Risk Interventions     No data to display

## 2023-04-06 NOTE — ED Triage Notes (Signed)
PT BIB [facility] from home after son states he witnessed an absence seizure from his father around 1500. PT has Hx of seizures, Dementia and parkinsons. Aox2 baseline, currently oriented to self and situation. Denies pain, follows commands. [facility] VSS, last pressure 164/87 CBG 105

## 2023-04-06 NOTE — ED Provider Notes (Signed)
[place] EMERGENCY DEPARTMENT AT [facility] Provider Note  CSN: [id number] Arrival date & time: 04/06/23 1618  Chief Complaint(s) Seizures  HPI Tyrone Noble is a 73 y.o. male history of coronary artery disease, dementia, prior stroke, Parkinson's disease, seizure disorder, presenting to the emergency department with possible seizure.  Apparently son witnessed possible absence seizure and called EMS.  EMS report transport was uneventful without seizures.  History is limited due to patient's underlying dementia, however he denies any pain, headaches, nausea vomiting, fevers, weakness.  He reports he feels okay.   Past Medical History Past Medical History:  Diagnosis Date   Coronary artery disease    Dementia (HCC)    Depression    Heart disease    Hypertension    Memory loss    Seizures (HCC)    Stroke (HCC)    "years ago" (02/16/2015)   Patient Active Problem List   Diagnosis Date Noted   COVID-19 06/15/2022   Acute encephalopathy 02/22/2021   Failure to thrive in adult    COVID-19 virus infection 03/19/2020   Generalized weakness 03/19/2020   Essential hypertension 03/19/2020   Seizure disorder (HCC) 03/19/2020   Parkinsonism (HCC) 02/26/2018   Therapeutic drug monitoring 09/12/2017   Seizures (HCC) 05/31/2017   Dementia without behavioral disturbance (HCC) 05/31/2017   New-onset angina (HCC) 02/16/2015   HYPERLIPIDEMIA 12/13/2009   ALCOHOLISM 07/07/2009   CALLUS, TOE 01/03/2007   DEPRESSION, MAJOR, RECURRENT 05/17/2006   TOBACCO DEPENDENCE 05/17/2006   HYPERTENSION, BENIGN SYSTEMIC 05/17/2006   HEMORRHOIDS, NOS 05/17/2006   GASTROESOPHAGEAL REFLUX, NO ESOPHAGITIS 05/17/2006   LEG PAIN OR KNEE PAIN 05/17/2006   Home Medication(s) Prior to Admission medications   Medication Sig Start Date End Date Taking? Authorizing Provider  aspirin 81 MG chewable tablet Chew 1 tablet (81 mg total) by mouth daily. 02/17/15   Rinaldo Cloud, MD  atorvastatin  (LIPITOR) 20 MG tablet Take 20 mg by mouth daily.  05/08/17   [provider]  carbidopa-levodopa (SINEMET IR) 25-250 MG tablet Take 1 tablet by mouth 4 (four) times daily. 11/08/22   Glean Salvo, NP  divalproex (DEPAKOTE ER) 500 MG 24 hr tablet Take 2 tablets (1,000 mg total) by mouth at bedtime. 11/08/22   Glean Salvo, NP  levETIRAcetam (KEPPRA) 1000 MG tablet Take 1 tablet (1,000 mg total) by mouth 2 (two) times daily. 02/12/23   Glean Salvo, NP  losartan (COZAAR) 100 MG tablet Take 50 mg by mouth in the morning. 02/21/21   [provider]  metoprolol succinate (TOPROL-XL) 25 MG 24 hr tablet Take 25 mg by mouth daily. 11/19/15   [provider]  Multiple Vitamins-Minerals (CENTRUM SILVER PO) Take 1 tablet by mouth daily.    [provider]  risperiDONE (RISPERDAL) 1 MG tablet Take 1 tablet (1 mg total) by mouth at bedtime. 11/08/22   Glean Salvo, NP  Past Surgical History Past Surgical History:  Procedure Laterality Date   CARDIAC CATHETERIZATION N/A 02/16/2015   Procedure: Left Heart Cath and Coronary Angiography;  Surgeon: Rinaldo Cloud, MD;  Location: [facility] INVASIVE CV LAB;  Service: Cardiovascular;  Laterality: N/A;   CARDIAC CATHETERIZATION N/A 02/16/2015   Procedure: Coronary Stent Intervention;  Surgeon: Rinaldo Cloud, MD;  Location: [facility] INVASIVE CV LAB;  Service: Cardiovascular;  Laterality: N/A;   CARDIAC CATHETERIZATION N/A 02/16/2015   Procedure: Intravascular Pressure Wire/FFR Study;  Surgeon: Rinaldo Cloud, MD;  Location: [facility] INVASIVE CV LAB;  Service: Cardiovascular;  Laterality: N/A;   CORONARY ANGIOPLASTY     LEFT HEART CATH AND CORONARY ANGIOGRAPHY N/A 03/29/2017   Procedure: LEFT HEART CATH AND CORONARY ANGIOGRAPHY;  Surgeon: Rinaldo Cloud, MD;  Location: [facility] INVASIVE CV LAB;  Service: Cardiovascular;  Laterality:  N/A;   LEFT HEART CATHETERIZATION WITH CORONARY ANGIOGRAM N/A 03/19/2012   Procedure: LEFT HEART CATHETERIZATION WITH CORONARY ANGIOGRAM;  Surgeon: Robynn Pane, MD;  Location: [facility] CATH LAB;  Service: Cardiovascular;  Laterality: N/A;   WISDOM TOOTH EXTRACTION     Family History Family History  Problem Relation Age of Onset   Diabetes Mother    Hypertension Mother    Stroke Father    Cancer Sister     Social History Social History   Tobacco Use   Smoking status: Former   Smokeless tobacco: Never   Tobacco comments:    quit many years ago  Substance Use Topics   Alcohol use: Not Currently    Comment: 02/16/2015 "last alcohol was in ~ 2012"   Drug use: No   Allergies Aricept [donepezil hcl] and Penicillins  Review of Systems Review of Systems  Unable to perform ROS: Dementia (limited)    Physical Exam Vital Signs  I have reviewed the triage vital signs BP (!) 162/68 (BP Location: Right Arm)   Pulse 65   Temp 98.7 F (37.1 C) (Oral)   Resp 14   Ht 5\' 7"  (1.702 m)   Wt 81.6 kg   SpO2 100%   BMI 28.19 kg/m  Physical Exam Vitals and nursing note reviewed.  Constitutional:      General: He is not in acute distress.    Appearance: Normal appearance.  HENT:     Mouth/Throat:     Mouth: Mucous membranes are moist.  Eyes:     Conjunctiva/sclera: Conjunctivae normal.  Cardiovascular:     Rate and Rhythm: Normal rate and regular rhythm.  Pulmonary:     Effort: Pulmonary effort is normal. No respiratory distress.     Breath sounds: Normal breath sounds.  Abdominal:     General: Abdomen is flat.     Palpations: Abdomen is soft.     Tenderness: There is no abdominal tenderness.  Musculoskeletal:     Right lower leg: No edema.     Left lower leg: No edema.  Skin:    General: Skin is warm and dry.     Capillary Refill: Capillary refill takes less than 2 seconds.  Neurological:     Mental Status: He is alert and oriented to person, place, and time. Mental  status is at baseline.     Comments: Moves all 4 extremities equally.  No facial droop.  Follows commands.  Able to state his name, knows that he is in the hospital, does not know why he is here or what year it is  Psychiatric:        Mood and Affect: Mood normal.  Behavior: Behavior normal.     ED Results and Treatments Labs (all labs ordered are listed, but only abnormal results are displayed) Labs Reviewed  URINALYSIS, W/ REFLEX TO CULTURE (INFECTION SUSPECTED) - Abnormal; Notable for the following components:      Result Value   Ketones, ur 5 (*)    All other components within normal limits  COMPREHENSIVE METABOLIC PANEL - Abnormal; Notable for the following components:   Creatinine, Ser 1.29 (*)    Calcium 8.7 (*)    Total Protein 6.3 (*)    Albumin 3.2 (*)    AST 14 (*)    GFR, Estimated 59 (*)    All other components within normal limits  RESP PANEL BY RT-PCR (RSV, FLU A&B, COVID)  RVPGX2  CBC WITH DIFFERENTIAL/PLATELET  VALPROIC ACID LEVEL  TSH  MAGNESIUM  AMMONIA                                                                                                                          Radiology CT Head Wo Contrast Result Date: 04/06/2023 CLINICAL DATA:  Provided history: Mental status change, unknown cause. EXAM: CT HEAD WITHOUT CONTRAST TECHNIQUE: Contiguous axial images were obtained from the base of the skull through the vertex without intravenous contrast. RADIATION DOSE REDUCTION: This exam was performed according to the departmental dose-optimization program which includes automated exposure control, adjustment of the mA and/or kV according to patient size and/or use of iterative reconstruction technique. COMPARISON:  Head CT 04/04/2023. FINDINGS: Brain: Generalized cerebral atrophy. Chronic lacunar infarct within the right centrum semiovale. Mild patchy and ill-defined hypoattenuation elsewhere within the cerebral white matter, nonspecific but compatible with  chronic small vessel ischemic disease. Chronic lacunar infarcts within the right thalamus. There is no acute intracranial hemorrhage. No demarcated cortical infarct. No extra-axial fluid collection. No evidence of an intracranial mass. No midline shift. Vascular: No hyperdense vessel.  Atherosclerotic calcifications. Skull: No calvarial fracture or aggressive osseous lesion. Sinuses/Orbits: No mass or acute finding within the imaged orbits. Mild mucosal thickening within the right maxillary sinus. Other: Partial opacification of the right middle ear cavity and right mastoid air cells. IMPRESSION: 1.  No evidence of an acute intracranial abnormality. 2. Parenchymal atrophy, chronic small vessel ischemic disease and chronic lacunar infarcts, as outlined within the body of the report. Findings are stable from the recent prior head CT of 04/04/2023. 3. Mild right maxillary sinus mucosal thickening. 4. Partial opacification of the right middle ear cavity and right mastoid air cells. Electronically Signed   By: Jackey Loge D.O.   On: 04/06/2023 18:30   DG Chest Portable 1 View Result Date: 04/06/2023 CLINICAL DATA:  Cough EXAM: PORTABLE CHEST 1 VIEW COMPARISON:  04/04/2023 chest radiograph. FINDINGS: Stable cardiomediastinal silhouette with normal heart size. No pneumothorax. No pleural effusion. Lungs appear clear, with no acute consolidative airspace disease and no pulmonary edema. IMPRESSION: No active disease. Electronically Signed   By: Delbert Phenix M.D.   On: 04/06/2023  17:44    Pertinent labs & imaging results that were available during my care of the patient were reviewed by me and considered in my medical decision making (see MDM for details).  Medications Ordered in ED Medications - No data to display                                                                                                                                   Procedures Procedures  (including critical care time)  Medical  Decision Making / ED Course   MDM:  73 year old male presenting to the emergency department with possible seizure.  On arrival to the emergency department, patient is overall well-appearing, oriented to self and knows that he is in the hospital, follows commands.  He did have recent ER evaluation for altered mental status only a couple of days ago which was reassuring.  Will attempt to obtain further history from the patient's son.  Will obtain toxic, metabolic, infectious workup, as well as CT head to evaluate for potential cause of his increased seizure frequency.  Extremely low concern for nonconvulsive status epilepticus given reassuring exam.  Will discuss with son.  They discussed with neurology whether the patient can have any seizure medications adjusted  Clinical Course as of 04/06/23 2228  Fri Apr 06, 2023  2147 Discussed with the patient's family, patient's daughter is actually a Child psychotherapist.  They have been trying to take care of him at home but he has just been weaker and they are worried that he will have a fall and I think that at this time that he needs to be placed.  They already have a place that they would like him to go to and they have talked to them and they would be willing to accept.  Will place him as a TOC border.  His medical workup is overall reassuring without significant abnormality.  His Depakote level is therapeutic.  No seizure activity has been observed in the emergency department. [WS]  2227 Given possible increase seizures, discussed with Dr. Zerita Boers with neurology who reviewed the patient's chart.  He recommends that we increase Keppra to 1250 twice daily.  Will reorder home meds including increased Keppra dose. [WS]    Clinical Course User Index [WS] [name], MD     Additional history obtained: -Additional history obtained from family and ems -External records from outside source obtained and reviewed including: Chart review including  previous notes, labs, imaging, consultation notes including recent ER visit for same    Lab Tests: -I ordered, reviewed, and interpreted labs.   The pertinent results include:   Labs Reviewed  URINALYSIS, W/ REFLEX TO CULTURE (INFECTION SUSPECTED) - Abnormal; Notable for the following components:      Result Value   Ketones, ur 5 (*)    All other components within normal limits  COMPREHENSIVE METABOLIC PANEL - Abnormal; Notable for the following components:  Creatinine, Ser 1.29 (*)    Calcium 8.7 (*)    Total Protein 6.3 (*)    Albumin 3.2 (*)    AST 14 (*)    GFR, Estimated 59 (*)    All other components within normal limits  RESP PANEL BY RT-PCR (RSV, FLU A&B, COVID)  RVPGX2  CBC WITH DIFFERENTIAL/PLATELET  VALPROIC ACID LEVEL  TSH  MAGNESIUM  AMMONIA    Notable for CKD at recent baseline      Imaging Studies ordered: I ordered imaging studies including CXR, CT head  On my interpretation imaging demonstrates no acute process I independently visualized and interpreted imaging. I agree with the radiologist interpretation   Medicines ordered and prescription drug management: No orders of the defined types were placed in this encounter.   -I have reviewed the patients home medicines and have made adjustments as needed    Reevaluation: After the interventions noted above, I reevaluated the patient and found that their symptoms have stayed the same  Co morbidities that complicate the patient evaluation  Past Medical History:  Diagnosis Date   Coronary artery disease    Dementia (HCC)    Depression    Heart disease    Hypertension    Memory loss    Seizures (HCC)    Stroke (HCC)    "years ago" (02/16/2015)      Dispostion: Disposition decision including need for hospitalization was considered, and patient boarding for social work/TOC evaluation     Final Clinical Impression(s) / ED Diagnoses Final diagnoses:  Generalized weakness     This chart  was dictated using voice recognition software.  Despite best efforts to proofread,  errors can occur which can change the documentation meaning.    [name], MD 04/06/23 2229

## 2023-04-07 DIAGNOSIS — R531 Weakness: Secondary | ICD-10-CM | POA: Diagnosis not present

## 2023-04-07 NOTE — Progress Notes (Signed)
CSW spoke with patient's daughter Lamar Laundry who states she is looking for LTC for the patient. Lamar Laundry states used to work at [employer] and has spoken with DON at [employer] who states there is a memory care bed available. Lamar Laundry states she and her husband are the patient's primary caregivers. Lamar Laundry states patient was attending memory care daycare at [facility] and that staff had to call 911 there twice this week. Lamar Laundry states she only wants patient's clinicals sent to [facility] for review. Lamar Laundry states that per Larey Brick, administrator at [facility], patient can be accepted with Medicaid pending.  CSW will complete SNF workup and will initiate insurance authorization once therapy notes are available.  Edwin Dada, MSW, LCSW Transitions of Care  Clinical Social Worker II [phone]

## 2023-04-07 NOTE — Evaluation (Signed)
Physical Therapy Evaluation Patient Details Name: Tyrone Noble MRN: [id number] DOB: October 30, 1950 Today's Date: 04/07/2023  History of Present Illness  Pt is a 73 y.o. male who presented 04/06/23 with concerns for an absence seizure. PMH includes HTN, HLD, CAD, depression, CVA, Parkinson's disease, dementia.   Clinical Impression  Pt presents with condition above and deficits mentioned below, see PT Problem List. No family is currently present to provide PLOF or home info. The pt is an unreliable historian as he is only oriented to himself and has a hx of dementia, but he reports he was independent for functional mobility without an AD PTA. Currently, the pt is needing CGA-minA for bed mobility, CGA for transfers, and CGA-minA to ambulate up to ~70 ft with a RW. He displays poor self awareness of when he is fatiguing and needs cues to turn around to return to room when he begins to demonstrate signs of fatigue, like his knees flexing further in stance phase. He is at high risk for falls, demonstrating deficits in functional strength, balance, activity tolerance, cognition, and core ROM. He could benefit from inpatient rehab, < 3 hours/day, or LTC at a memory care facility if family is unable to provide the level of care he needs. Per chart, plan is for pt to go [facility]'s memory care facility. Will continue to follow acutely.        If plan is discharge home, recommend the following: A little help with walking and/or transfers;Assistance with cooking/housework;A lot of help with bathing/dressing/bathroom;Direct supervision/assist for medications management;Direct supervision/assist for financial management;Assist for transportation;Help with stairs or ramp for entrance;Supervision due to cognitive status   Can travel by private vehicle   Yes    Equipment Recommendations Other (comment);Rolling walker (2 wheels);BSC/3in1;Wheelchair cushion (measurements PT);Wheelchair (measurements PT) (defer  to next venue of care)  Recommendations for Other Services       Functional Status Assessment Patient has had a recent decline in their functional status and demonstrates the ability to make significant improvements in function in a reasonable and predictable amount of time.     Precautions / Restrictions Precautions Precautions: Fall Restrictions Weight Bearing Restrictions Per Provider Order: No      Mobility  Bed Mobility Overal bed mobility: Needs Assistance Bed Mobility: Supine to Sit, Sit to Supine     Supine to sit: Contact guard, HOB elevated, Used rails Sit to supine: Min assist   General bed mobility comments: CGA and extra time needed to sit up L EOB using bed features. MinA needed to bring legs medially in bed to return to supine    Transfers Overall transfer level: Needs assistance Equipment used: Rolling walker (2 wheels) Transfers: Sit to/from Stand Sit to Stand: Contact guard assist           General transfer comment: Extra time and effort to stand from EOB, extra time to extend trunk and gain balance, CGA for safety    Ambulation/Gait Ambulation/Gait assistance: Contact guard assist, Min assist Gait Distance (Feet): 70 Feet Assistive device: Rolling walker (2 wheels) Gait Pattern/deviations: Step-through pattern, Decreased step length - right, Decreased step length - left, Decreased stride length, Knee flexed in stance - right, Knee flexed in stance - left, Shuffle, Trunk flexed, Narrow base of support Gait velocity: reduced Gait velocity interpretation: <1.31 ft/sec, indicative of household ambulator   General Gait Details: Pt ambulates with slow, small, narrow, shuffling steps and a flexed posture, demonstrating a Parkinsonian gait pattern and posture. Pt needed cues to sequence  feet and widen stance at times. Intermittent minA needed to guide RW. No LOB, CGA for balance, but pt did not seem aware of his fatigue enough to indicate when he needed to  turn around, needing cues to turn to return to room when his knees were noted to fatigue by flexing more.  Stairs            Wheelchair Mobility     Tilt Bed    Modified Rankin (Stroke Patients Only)       Balance Overall balance assessment: Needs assistance Sitting-balance support: No upper extremity supported, Feet supported Sitting balance-Leahy Scale: Good Sitting balance - Comments: able to sit and reach min off COG to feed self, > 10 min   Standing balance support: Bilateral upper extremity supported, During functional activity, Reliant on assistive device for balance Standing balance-Leahy Scale: Poor Standing balance comment: Reliant on RW                             Pertinent Vitals/Pain Pain Assessment Pain Assessment: Faces Faces Pain Scale: No hurt Pain Intervention(s): Monitored during session    Home Living Family/patient expects to be discharged to:: Skilled nursing facility                   Additional Comments: per chart, plan is for pt to find LTC memory care facility    Prior Function Prior Level of Function : Needs assist;Patient poor historian/Family not available             Mobility Comments: Pt reports he is independent without AD at baseline, no family present to confirm, likely needed supervision at least for safety considering cognitive and balance deficits       Extremity/Trunk Assessment   Upper Extremity Assessment Upper Extremity Assessment: Generalized weakness    Lower Extremity Assessment Lower Extremity Assessment: Generalized weakness (MMT 4+ to 5 bil quads, but noted weakness with functional mobility)    Cervical / Trunk Assessment Cervical / Trunk Assessment: Kyphotic  Communication   Communication Communication: Hearing impairment  Cognition Arousal: Alert Behavior During Therapy: Flat affect Overall Cognitive Status: History of cognitive impairments - at baseline                                  General Comments: hx of dementia per chart, pt oriented to self but not location or current year, follows simple cues with extra time        General Comments      Exercises     Assessment/Plan    PT Assessment Patient needs continued PT services  PT Problem List Decreased strength;Decreased range of motion;Decreased activity tolerance;Decreased balance;Decreased mobility;Decreased cognition;Decreased coordination;Decreased safety awareness       PT Treatment Interventions DME instruction;Gait training;Functional mobility training;Therapeutic activities;Therapeutic exercise;Balance training;Neuromuscular re-education;Cognitive remediation;Patient/family education;Wheelchair mobility training    PT Goals (Current goals can be found in the Care Plan section)  Acute Rehab PT Goals Patient Stated Goal: did not state PT Goal Formulation: With patient Time For Goal Achievement: 04/21/23 Potential to Achieve Goals: Fair    Frequency Min 1X/week     Co-evaluation               AM-PAC PT "6 Clicks" Mobility  Outcome Measure Help needed turning from your back to your side while in a flat bed without using bedrails?: A Little Help needed  moving from lying on your back to sitting on the side of a flat bed without using bedrails?: A Little Help needed moving to and from a bed to a chair (including a wheelchair)?: A Little Help needed standing up from a chair using your arms (e.g., wheelchair or bedside chair)?: A Little Help needed to walk in hospital room?: A Little Help needed climbing 3-5 steps with a railing? : Total 6 Click Score: 16    End of Session Equipment Utilized During Treatment: Gait belt Activity Tolerance: Patient tolerated treatment well Patient left: in bed;with call bell/phone within reach;with bed alarm set Nurse Communication: Mobility status PT Visit Diagnosis: Unsteadiness on feet (R26.81);Other abnormalities of gait and mobility  (R26.89);Muscle weakness (generalized) (M62.81);Difficulty in walking, not elsewhere classified (R26.2);Other symptoms and signs involving the nervous system (R29.898)    Time: 3474-2595 PT Time Calculation (min) (ACUTE ONLY): 24 min   Charges:   PT Evaluation $PT Eval Moderate Complexity: 1 Mod PT Treatments $Gait Training: 8-22 mins PT General Charges $$ ACUTE PT VISIT: 1 Visit         Virgil Benedict, PT, DPT Acute Rehabilitation Services  Office: [phone]   Bettina Gavia 04/07/2023, 1:24 PM

## 2023-04-07 NOTE — NC FL2 (Cosign Needed Addendum)
[place] MEDICAID FL2 LEVEL OF CARE FORM     IDENTIFICATION  Patient Name: Tyrone Noble Birthdate: 1950/06/07 Sex: male Admission Date (Current Location): 04/06/2023  [facility] and [place] Number:  Producer, television/film/video and Address:  [facility] [place], [address], [place], [place] [address]      Provider Number: [id number]  Attending Physician Name and Address:  Eber Hong, MD  Relative Name and Phone Number:       Current Level of Care: Hospital Recommended Level of Care: Skilled Nursing Facility Prior Approval Number:    Date Approved/Denied:   PASRR Number: Pending  Discharge Plan: SNF    Current Diagnoses: Patient Active Problem List   Diagnosis Date Noted   COVID-19 06/15/2022   Acute encephalopathy 02/22/2021   Failure to thrive in adult    COVID-19 virus infection 03/19/2020   Generalized weakness 03/19/2020   Essential hypertension 03/19/2020   Seizure disorder (HCC) 03/19/2020   Parkinsonism (HCC) 02/26/2018   Therapeutic drug monitoring 09/12/2017   Seizures (HCC) 05/31/2017   Dementia without behavioral disturbance (HCC) 05/31/2017   New-onset angina (HCC) 02/16/2015   HYPERLIPIDEMIA 12/13/2009   ALCOHOLISM 07/07/2009   CALLUS, TOE 01/03/2007   DEPRESSION, MAJOR, RECURRENT 05/17/2006   TOBACCO DEPENDENCE 05/17/2006   HYPERTENSION, BENIGN SYSTEMIC 05/17/2006   HEMORRHOIDS, NOS 05/17/2006   GASTROESOPHAGEAL REFLUX, NO ESOPHAGITIS 05/17/2006   LEG PAIN OR KNEE PAIN 05/17/2006    Orientation RESPIRATION BLADDER Height & Weight     Self, Situation  Normal Continent Weight: 180 lb (81.6 kg) Height:  5\' 7"  (170.2 cm)  BEHAVIORAL SYMPTOMS/MOOD NEUROLOGICAL BOWEL NUTRITION STATUS      Continent Diet (See discharge summary)  AMBULATORY STATUS COMMUNICATION OF NEEDS Skin   Extensive Assist Verbally Normal                       Personal Care Assistance Level of Assistance  Bathing, Dressing, Feeding Bathing  Assistance: Limited assistance Feeding assistance: Limited assistance Dressing Assistance: Limited assistance     Functional Limitations Info  Sight, Hearing, Speech Sight Info: Adequate Hearing Info: Adequate Speech Info: Adequate    SPECIAL CARE FACTORS FREQUENCY  PT (By licensed PT), OT (By licensed OT)     PT Frequency: 5x weekly OT Frequency: 5x weekly            Contractures Contractures Info: Not present    Additional Factors Info  Code Status, Allergies Code Status Info: Full Code Allergies Info: Aricept (Donepezil Hcl)  Penicillins           Current Medications (04/07/2023):  This is the current hospital active medication list Current Facility-Administered Medications  Medication Dose Route Frequency Provider Last Rate Last Admin   atorvastatin (LIPITOR) tablet 20 mg  20 mg Oral Daily [name], MD   20 mg at 04/07/23 1046   carbidopa-levodopa (SINEMET IR) 25-250 MG per tablet immediate release 1 tablet  1 tablet Oral QID [name], MD   1 tablet at 04/07/23 1046   divalproex (DEPAKOTE ER) 24 hr tablet 1,000 mg  1,000 mg Oral QHS [name], MD   1,000 mg at 04/07/23 0209   levETIRAcetam (KEPPRA) tablet 1,250 mg  1,250 mg Oral BID [name], MD   1,250 mg at 04/07/23 1045   losartan (COZAAR) tablet 50 mg  50 mg Oral q AM [name], MD   50 mg at 04/07/23 1045   metoprolol succinate (  TOPROL-XL) 24 hr tablet 25 mg  25 mg Oral Daily [name], MD   25 mg at 04/07/23 1046   risperiDONE (RISPERDAL) tablet 1 mg  1 mg Oral QHS [name], MD   1 mg at 04/07/23 0208   Current Outpatient Medications  Medication Sig Dispense Refill   aspirin 81 MG chewable tablet Chew 1 tablet (81 mg total) by mouth daily. 30 tablet 3   atorvastatin (LIPITOR) 20 MG tablet Take 20 mg by mouth daily.   5   carbidopa-levodopa (SINEMET IR) 25-250 MG tablet Take 1 tablet by mouth 4 (four) times daily. 120 tablet 11    divalproex (DEPAKOTE ER) 500 MG 24 hr tablet Take 2 tablets (1,000 mg total) by mouth at bedtime. 180 tablet 3   levETIRAcetam (KEPPRA) 1000 MG tablet Take 1 tablet (1,000 mg total) by mouth 2 (two) times daily. 180 tablet 1   losartan (COZAAR) 100 MG tablet Take 50 mg by mouth in the morning.     metoprolol succinate (TOPROL-XL) 25 MG 24 hr tablet Take 25 mg by mouth daily.     risperiDONE (RISPERDAL) 1 MG tablet Take 1 tablet (1 mg total) by mouth at bedtime. 30 tablet 11     Discharge Medications: Please see discharge summary for a list of discharge medications.  Relevant Imaging Results:  Relevant Lab Results:   Additional Information SSN# [id number]  Inis Sizer, LCSW

## 2023-04-07 NOTE — Progress Notes (Cosign Needed Addendum)
Name: Tyrone Noble DOB: 10/10/1950  Please be advised that the above-named patient has a primary diagnosis of dementia which supersedes any psychiatric diagnosis. Patient needs 30 days or less of rehab at [facility].  Edwin Dada, MSW, LCSW Transitions of Care  Clinical Social Worker II [phone]

## 2023-04-08 DIAGNOSIS — R531 Weakness: Secondary | ICD-10-CM | POA: Diagnosis not present

## 2023-04-08 NOTE — Progress Notes (Addendum)
CSW received a denial from [facility].  CSW spoke with Whitney Post in admissions to inform of information obtained from patient's daughter Lamar Laundry. Whitney Post states she will speak with administrator at the facility and will notify CSW with any updates.  CSW submitted documentation to [facility] to obtain a PASRR.  Edwin Dada, MSW, LCSW Transitions of Care  Clinical Social Worker II [phone]

## 2023-04-08 NOTE — Telephone Encounter (Signed)
Primary neurologist is Dr. Terrace Arabia. I would complete an EEG (possible amb eeg) prior to increasing additional meds.

## 2023-04-09 DIAGNOSIS — R531 Weakness: Secondary | ICD-10-CM | POA: Diagnosis not present

## 2023-04-09 NOTE — ED Notes (Signed)
Checked brief. Brief clean and dry

## 2023-04-09 NOTE — Progress Notes (Addendum)
12:40pm: CSW received call from [facility] at [facility] stating the facility has offered patient a bed.  CSW will initiate insurance authorization.  9am: CSW spoke with Whitney Post at [facility] to discuss possible bed offer for patient. Logan states referral is still under review by administrator at this time.  Patient's PASRR is still pending.  Edwin Dada, MSW, LCSW Transitions of Care  Clinical Social Worker II [phone]

## 2023-04-09 NOTE — ED Notes (Signed)
Messaged pharmacy to send sinemet IR

## 2023-04-09 NOTE — ED Notes (Signed)
Offered pt food. Pt reports he will try to eat after awhile.

## 2023-04-09 NOTE — ED Notes (Signed)
Pt is OOB and states that he needs to use the bathroom. Pt ambulated with standby assist to BR. Tech/sitter will assist back to bed. Report given to sitter at this time.

## 2023-04-09 NOTE — ED Notes (Signed)
Called staffing, no sitters available at this time.  

## 2023-04-09 NOTE — ED Provider Notes (Signed)
Emergency Medicine Observation Re-evaluation Note  Tyrone Noble is a 73 y.o. male, seen on rounds today.  Pt initially presented to the ED for complaints of Seizures Currently, the patient is asleep in bed, no new concerns by nursing staff.  Physical Exam  BP (!) 161/84 (BP Location: Left Arm)   Pulse 68   Temp 97.6 F (36.4 C) (Oral)   Resp 16   Ht 5\' 7"  (1.702 m)   Wt 81.6 kg   SpO2 100%   BMI 28.19 kg/m  Physical Exam General: Asleep, no acute distress Cardiac: Regular rate Lungs: No increased work of breathing Psych: Calm, asleep  ED Course / MDM  EKG:   I have reviewed the labs performed to date as well as medications administered while in observation.  Recent changes in the last 24 hours include patient medically cleared. Family concerned he was too weak to return home. Is being evaluated by PT and TOC for SNF.  Plan  Current plan is for SNF.    Elayne Snare K, DO 04/09/23 [phone]

## 2023-04-09 NOTE — ED Notes (Signed)
Asked pt if he wants his lunch now or to continue sleeping. Pt stated, "sleep".

## 2023-04-09 NOTE — ED Notes (Signed)
Brief checked. Brief dry.

## 2023-04-09 NOTE — Telephone Encounter (Signed)
Depakote level on April 06, 2023 was 61, with normal ammonia,  He has room for higher dose of Depakote, may consider Depakote ER 500 mg 3 tablets every night along with Keppra 1000 mg twice a day  He was seen by Maralyn Sago in August 2024, ER presentation on April 06, 2023, if needed may move up follow-up with Maralyn Sago

## 2023-04-10 DIAGNOSIS — R531 Weakness: Secondary | ICD-10-CM | POA: Diagnosis not present

## 2023-04-10 NOTE — Telephone Encounter (Signed)
Call to son Alfredo Martinez, he states patient went back to ER on 04/06/23 for seizures and has been there since. He is unsure if patient has had seizures but he does state he is sleeping all the time and mumbles, unable to carry on conversation. Hs is awaiting SNF placement at [facility]. Advised son to contact us once patient is discharged for recommendation and advise. Son verbalized understanding

## 2023-04-10 NOTE — ED Notes (Signed)
Pt has been calm and cooperative throughout shift. Pt is clean and dry with medication admin. No acute distress noted, will cont to monitor. Pt voiced no needs at this time.

## 2023-04-10 NOTE — ED Provider Notes (Signed)
Emergency Medicine Observation Re-evaluation Note  Tyrone Noble is a 73 y.o. male, seen on rounds today.  Pt initially presented to the ED for complaints of Seizures Currently, the patient is eating breakfast but having to be fed by staff.  He has no complaints at this time.  Physical Exam  BP 130/78   Pulse 68   Temp 98 F (36.7 C) (Oral)   Resp 16   Ht 5\' 7"  (1.702 m)   Wt 81.6 kg   SpO2 99%   BMI 28.19 kg/m  Physical Exam General: No acute distress Cardiac: Regular rate Lungs: Clear in no distress Psych: Calm and cooperative  ED Course / MDM  EKG:   I have reviewed the labs performed to date as well as medications administered while in observation.  Recent changes in the last 24 hours include no issues overnight.  Plan  Current plan is for patient currently waiting for placement.  Possibly going to [facility] today but waiting for bed offer to go through.    Gwyneth Sprout, MD 04/10/23 [phone]

## 2023-04-10 NOTE — Progress Notes (Signed)
Physical Therapy Treatment Patient Details Name: Tyrone Noble MRN: [id number] DOB: 02/02/1951 Today's Date: 04/10/2023   History of Present Illness Pt is a 73 y.o. male who presented 04/06/23 with concerns for an absence seizure. PMH includes HTN, HLD, CAD, depression, CVA, Parkinson's disease, dementia.    PT Comments  Pt tolerates treatment well, ambulating for increased distances this session. Pt remains at an increased risk for falls due to impaired cognition and strength/balance deficits. Pt will benefit from continued frequent mobilization in an effort to improve stability and reduce risk for falls. Patient will benefit from continued inpatient follow up therapy, <3 hours/day.    If plan is discharge home, recommend the following: A little help with walking and/or transfers;A little help with bathing/dressing/bathroom;Assistance with cooking/housework;Assist for transportation;Help with stairs or ramp for entrance   Can travel by private vehicle     Yes  Equipment Recommendations   (defer to post-acute setting)    Recommendations for Other Services       Precautions / Restrictions Precautions Precautions: Fall Restrictions Weight Bearing Restrictions Per Provider Order: No     Mobility  Bed Mobility Overal bed mobility: Needs Assistance Bed Mobility: Supine to Sit, Sit to Supine     Supine to sit: Contact guard, HOB elevated Sit to supine: Min assist   General bed mobility comments: minA to reposition in bed    Transfers Overall transfer level: Needs assistance Equipment used: Rolling walker (2 wheels) Transfers: Sit to/from Stand Sit to Stand: Contact guard assist           General transfer comment: increased time to ascend into standing    Ambulation/Gait Ambulation/Gait assistance: Contact guard assist Gait Distance (Feet): 60 Feet (60' x 2 laps) Assistive device: Rolling walker (2 wheels) Gait Pattern/deviations: Step-to pattern, Trunk  flexed Gait velocity: reduced Gait velocity interpretation: <1.31 ft/sec, indicative of household ambulator   General Gait Details: pt with slowed step-to gait, increased trunk flexion over RW, reduced gait speed and further increase in time for changes in direction   Stairs             Wheelchair Mobility     Tilt Bed    Modified Rankin (Stroke Patients Only)       Balance Overall balance assessment: Needs assistance Sitting-balance support: No upper extremity supported, Feet supported Sitting balance-Leahy Scale: Good     Standing balance support: Bilateral upper extremity supported, Reliant on assistive device for balance Standing balance-Leahy Scale: Poor                              Cognition Arousal: Alert Behavior During Therapy: Flat affect Overall Cognitive Status: History of cognitive impairments - at baseline                                 General Comments: hx of dementia per chart, pt oriented to self but not location or current year, follows simple cues with extra time        Exercises      General Comments General comments (skin integrity, edema, etc.): VSS on RA      Pertinent Vitals/Pain Pain Assessment Pain Assessment: No/denies pain    Home Living                          Prior Function  PT Goals (current goals can now be found in the care plan section) Acute Rehab PT Goals Patient Stated Goal: to return home Progress towards PT goals: Progressing toward goals    Frequency    Min 1X/week      PT Plan      Co-evaluation              AM-PAC PT "6 Clicks" Mobility   Outcome Measure  Help needed turning from your back to your side while in a flat bed without using bedrails?: A Little Help needed moving from lying on your back to sitting on the side of a flat bed without using bedrails?: A Little Help needed moving to and from a bed to a chair (including a  wheelchair)?: A Little Help needed standing up from a chair using your arms (e.g., wheelchair or bedside chair)?: A Little Help needed to walk in hospital room?: A Little Help needed climbing 3-5 steps with a railing? : Total 6 Click Score: 16    End of Session Equipment Utilized During Treatment: Gait belt Activity Tolerance: Patient tolerated treatment well Patient left: in bed;with call bell/phone within reach;with nursing/sitter in room Nurse Communication: Mobility status PT Visit Diagnosis: Unsteadiness on feet (R26.81);Other abnormalities of gait and mobility (R26.89);Muscle weakness (generalized) (M62.81);Difficulty in walking, not elsewhere classified (R26.2);Other symptoms and signs involving the nervous system (R29.898)     Time: 1101-1120 PT Time Calculation (min) (ACUTE ONLY): 19 min  Charges:    $Gait Training: 8-22 mins PT General Charges $$ ACUTE PT VISIT: 1 Visit                     Arlyss Gandy, PT, DPT Acute Rehabilitation Office [phone]    Arlyss Gandy 04/10/2023, 11:31 AM

## 2023-04-10 NOTE — Progress Notes (Addendum)
12:55pm: CSW received call from [facility] requesting patient's prior level of functioning and recent MD note - notes uploaded into portal.   11:30am: Patient's PASRR is [id number].  CSW submitted updated PT note to [facility] for review.  8:45am: Patient's PASRR is still pending at this time.  CSW reviewed Navi - new PT note is being requested.  CSW spoke with Bjorn Loser in the therapy office to request PT see patient ASAP.  Edwin Dada, MSW, LCSW Transitions of Care  Clinical Social Worker II [phone]

## 2023-04-11 DIAGNOSIS — R531 Weakness: Secondary | ICD-10-CM | POA: Diagnosis not present

## 2023-04-11 NOTE — Care Management (Signed)
Received a call conference call from Normajean Glasgow / Toniann Fail from [facility] stating that patient has been accepted and he is able to come tomorrow 1/23 as early as possible. Lamar Laundry would like to be notified.

## 2023-04-11 NOTE — Progress Notes (Addendum)
10:40am: CSW received call from Toniann Fail stating that per business office and administrator, patient can be accepted into the facility tomorrow regardless of outcome from insurance authorization. Toniann Fail states she will speak with patient's daughter to discuss financial requirements.  10:25am: CSW spoke with Kendal Hymen at [place] who states patient's insurance authorization is still pending at this time. Kendal Hymen states it is under review by MD but is not a peer to peer request yet.   CSW spoke with Toniann Fail at [facility] to discuss pending auth Toniann Fail to speak with business office to determine if patient can be accepted solely as Medicaid pending if Berkley Harvey is denied and will return call to CSW.  Edwin Dada, MSW, LCSW Transitions of Care  Clinical Social Worker II [phone]

## 2023-04-11 NOTE — ED Provider Notes (Signed)
Emergency Medicine Observation Re-evaluation Note  Tyrone Noble is a 73 y.o. male, seen on rounds today.  Pt initially presented to the ED for complaints of Seizures Currently, the patient is sleeping, arousable.  Physical Exam  BP 115/65 (BP Location: Left Arm)   Pulse 71   Temp 98 F (36.7 C) (Oral)   Resp 17   Ht 5\' 7"  (1.702 m)   Wt 81.6 kg   SpO2 99%   BMI 28.19 kg/m  Physical Exam General: no distress Cardiac: regular rate Lungs: clear Psych: calm  ED Course / MDM  EKG:   I have reviewed the labs performed to date as well as medications administered while in observation.  Recent changes in the last 24 hours include none.  Plan  Current plan is for SNF placement.    [name], MD 04/11/23 [phone]

## 2023-04-12 DIAGNOSIS — I959 Hypotension, unspecified: Secondary | ICD-10-CM | POA: Diagnosis not present

## 2023-04-12 DIAGNOSIS — R410 Disorientation, unspecified: Secondary | ICD-10-CM | POA: Diagnosis not present

## 2023-04-12 DIAGNOSIS — R404 Transient alteration of awareness: Secondary | ICD-10-CM | POA: Diagnosis not present

## 2023-04-12 DIAGNOSIS — R569 Unspecified convulsions: Secondary | ICD-10-CM | POA: Diagnosis not present

## 2023-04-12 DIAGNOSIS — Z743 Need for continuous supervision: Secondary | ICD-10-CM | POA: Diagnosis not present

## 2023-04-12 NOTE — ED Notes (Signed)
[facility] called; eta "12:15p"

## 2023-04-12 NOTE — ED Notes (Signed)
Offered pt medications again, pt responded with "not at this time".

## 2023-04-12 NOTE — ED Provider Notes (Addendum)
Emergency Medicine Observation Re-evaluation Note  Tyrone Noble is a 73 y.o. male, seen on rounds today.  Pt initially presented to the ED for complaints of Seizures Currently, the patient is sleeping comfortably without distress.  Physical Exam  BP (!) 139/90   Pulse 72   Temp 97.6 F (36.4 C) (Oral)   Resp 16   Ht 5\' 7"  (1.702 m)   Wt 81.6 kg   SpO2 100%   BMI 28.19 kg/m  Physical Exam General: Resting without agitation Cardiac: Not tachycardic on last vitals Lungs: Symmetric rise and fall of chest without respiratory difficulty Psych: No agitation  ED Course / MDM  EKG:   I have reviewed the labs performed to date as well as medications administered while in observation.  Recent changes in the last 24 hours include none reported by nursing.  Plan  Current plan is for awaiting SNF placement.    Sarit Sparano, Canary Brim, MD 04/12/23 [phone]  8:51 AM Was informed patient is being discharged to go to [facility] today.  Will flip him over to discharge.    Kaytlyn Din, Canary Brim, MD 04/12/23 [phone]

## 2023-04-12 NOTE — Progress Notes (Addendum)
Patient's insurance authorization is still pending however Tyrone Noble willing to accept patient without authorization and will go to the facility as pending Medicaid.  CSW spoke with Tyrone Noble at [facility] who is requesting patient's AVS and MAR. CSW sent AVS and MAR via secure email.  Patient will go to [facility], room #207A via [facility] - RN to call for pick up at 12pm. The number to call for report is [phone].  CSW spoke with patient's daughter Tyrone Noble to inform her of discharge plan.  Tyrone Noble, MSW, LCSW Transitions of Care  Clinical Social Worker II [phone]

## 2023-04-14 ENCOUNTER — Encounter: Payer: Self-pay | Admitting: Neurology

## 2023-04-14 DIAGNOSIS — G20A1 Parkinson's disease without dyskinesia, without mention of fluctuations: Secondary | ICD-10-CM | POA: Diagnosis not present

## 2023-04-14 DIAGNOSIS — Z741 Need for assistance with personal care: Secondary | ICD-10-CM | POA: Diagnosis not present

## 2023-04-14 DIAGNOSIS — M6281 Muscle weakness (generalized): Secondary | ICD-10-CM | POA: Diagnosis not present

## 2023-04-14 DIAGNOSIS — R2681 Unsteadiness on feet: Secondary | ICD-10-CM | POA: Diagnosis not present

## 2023-04-14 DIAGNOSIS — R627 Adult failure to thrive: Secondary | ICD-10-CM | POA: Diagnosis not present

## 2023-04-14 DIAGNOSIS — G40909 Epilepsy, unspecified, not intractable, without status epilepticus: Secondary | ICD-10-CM

## 2023-04-14 NOTE — Procedures (Signed)
   CLINICAL HISTORY:  This is a 73 y/o M with h/o seizures presenting with episodes of AMS. EEG requested for evaluation of seizure activity  MEDICATIONS: Depakote, Keppra   TECHNICAL SUMMARY: Electrodes are applied using the standard 10-20 international placement protocol. This ambulatory EEG recording was performed utilizing 23 electrode inputs including: (19) cephalic, (1) ground, (1) system reference, and (2) EKG. The EEG recording can be visualized in all standard types of montages for review. The recordings are done at a sampling rate of 200 samples per second, per channel, allowing for relatively high frequency responses of 70 Hz. EEG playbacks are done with digital high frequency filters noted at the top of the page. The EEG is notated with patient events at the direction of the patient by pressing a push button mounted on a waist worn EEG recording device. This EEG was also recorded using high definition digital video.   TECHNOLOGIST NOTE: No skull defect or scalp edemas were present.   BACKGROUND: The record was obtained in the awake and drowsy state. No sleep was able to be captured. The posterior awake dominant background activity was a continuous, reactive rhythm of 11 Hz. This was symmetric and well-modulated and attenuated with eye opening. Symmetric diffuse frontocentral beta range activity was present.   SIGNIFICANT FINDINGS: There was intermittent right temporal slowing. There were no apparent electrographic or clinical seizures noted by the reviewing neurodiagnostic technologist.   PATIENT EVENTS: There were no patient events noted or captured during this recording. Patient removed leads an hour after 48hr ambulatory EEG was started.   HYPERVENTILATION: Hyperventilation was not performed. PHOTIC STIMULATION: IPS Was not performed. EKG: There were no arrhythmias or abnormalities noted during this recording.   Impression: This is an abnormal 1 hour and 6 minutes EEG due to presence  of intermittent right temporal focal slowing. This is consistent with an area of neuronal dysfunction in the right temporal region.    Windell Norfolk, MD [facility]

## 2023-04-16 ENCOUNTER — Telehealth: Payer: Self-pay | Admitting: Neurology

## 2023-04-16 ENCOUNTER — Other Ambulatory Visit: Payer: Self-pay

## 2023-04-16 DIAGNOSIS — M6281 Muscle weakness (generalized): Secondary | ICD-10-CM | POA: Diagnosis not present

## 2023-04-16 DIAGNOSIS — G20A1 Parkinson's disease without dyskinesia, without mention of fluctuations: Secondary | ICD-10-CM | POA: Diagnosis not present

## 2023-04-16 DIAGNOSIS — R627 Adult failure to thrive: Secondary | ICD-10-CM | POA: Diagnosis not present

## 2023-04-16 DIAGNOSIS — R2681 Unsteadiness on feet: Secondary | ICD-10-CM | POA: Diagnosis not present

## 2023-04-16 DIAGNOSIS — Z741 Need for assistance with personal care: Secondary | ICD-10-CM | POA: Diagnosis not present

## 2023-04-16 NOTE — Telephone Encounter (Signed)
Chart reviewed, taken to the ER 04/04/2023 for not acting like himself at adult daycare.  Creatinine 1.40, normal ammonia, no UTI, CT head negative for acute finding.  He was rehydrated and discharged home.  Then called our office 1/16 for seizure episodes, reporting staring off episodes, slumping over being unresponsive. EEG was recommended.  Went to the ER 04/06/2023 for possible seizure.  Family felt he needed skilled facility placement.  Discussed with hospital neurologist Dr. Zerita Boers who increased his Keppra 1250 mg twice daily.  CT head was stable, creatinine 1.29, TSH 1.526, Depakote level 68.  According to the ER note he was transferred to [facility] nursing home.  Dr. Teresa Coombs read his EEG (I ordered back in November): Abnormal 48-hour EEG showing intermittent right temporal focal slowing consistent with area of neuronal dysfunction in the right temporal region.  Was abnormal for 1 hour and 6 minutes.  Impression: This is an abnormal 1 hour and 6 minutes EEG due to presence of intermittent right temporal focal slowing. This is consistent with an area of neuronal dysfunction in the right temporal region.   Please call his family to check on how he is doing and review abnormal EEG results.  Please verify if Keppra was indeed increased?  If it was not we may consider increasing Depakote ER 500 mg, 3 tablets at bedtime (currently taking 2 tablets at bedtime).   Is scheduled to see me in April for follow-up, can move up follow up visit if needed. Thanks

## 2023-04-16 NOTE — Telephone Encounter (Signed)
Son in law has requested appointment be r/s

## 2023-04-16 NOTE — Telephone Encounter (Signed)
Call to son Beckie Busing, he is agreement to bring patient to visit tomorrow at 8:15.

## 2023-04-16 NOTE — Telephone Encounter (Signed)
Call to son Beckie Busing, he reports patient is now living at [facility]. He recalls the abnormal EEG Maralyn Sago had ordered and states they tried to get another another one but patient would not leave it on. Son denies any events/episodes since leaving hospital. We discussed medications and son reports patient is taking both Keppra and Depakote as listed in the mediation list  and no changes were made at the hospital. Son is aware to call with any changes and appointment is added to wait list.

## 2023-04-17 ENCOUNTER — Telehealth: Payer: Self-pay

## 2023-04-17 ENCOUNTER — Ambulatory Visit: Payer: Medicare HMO | Admitting: Neurology

## 2023-04-17 DIAGNOSIS — F03911 Unspecified dementia, unspecified severity, with agitation: Secondary | ICD-10-CM | POA: Diagnosis not present

## 2023-04-17 DIAGNOSIS — R627 Adult failure to thrive: Secondary | ICD-10-CM | POA: Diagnosis not present

## 2023-04-17 DIAGNOSIS — R2681 Unsteadiness on feet: Secondary | ICD-10-CM | POA: Diagnosis not present

## 2023-04-17 DIAGNOSIS — Z741 Need for assistance with personal care: Secondary | ICD-10-CM | POA: Diagnosis not present

## 2023-04-17 DIAGNOSIS — G20A1 Parkinson's disease without dyskinesia, without mention of fluctuations: Secondary | ICD-10-CM | POA: Diagnosis not present

## 2023-04-17 DIAGNOSIS — M6281 Muscle weakness (generalized): Secondary | ICD-10-CM | POA: Diagnosis not present

## 2023-04-17 DIAGNOSIS — R82998 Other abnormal findings in urine: Secondary | ICD-10-CM | POA: Diagnosis not present

## 2023-04-17 NOTE — Telephone Encounter (Signed)
Called to offer 3$5 appt with sarah today, no answer. Left message

## 2023-04-18 DIAGNOSIS — M6281 Muscle weakness (generalized): Secondary | ICD-10-CM | POA: Diagnosis not present

## 2023-04-18 DIAGNOSIS — G4089 Other seizures: Secondary | ICD-10-CM | POA: Diagnosis not present

## 2023-04-18 DIAGNOSIS — R2681 Unsteadiness on feet: Secondary | ICD-10-CM | POA: Diagnosis not present

## 2023-04-18 DIAGNOSIS — Z741 Need for assistance with personal care: Secondary | ICD-10-CM | POA: Diagnosis not present

## 2023-04-18 DIAGNOSIS — G20A1 Parkinson's disease without dyskinesia, without mention of fluctuations: Secondary | ICD-10-CM | POA: Diagnosis not present

## 2023-04-18 DIAGNOSIS — R627 Adult failure to thrive: Secondary | ICD-10-CM | POA: Diagnosis not present

## 2023-04-18 DIAGNOSIS — F32A Depression, unspecified: Secondary | ICD-10-CM | POA: Diagnosis not present

## 2023-04-18 DIAGNOSIS — R569 Unspecified convulsions: Secondary | ICD-10-CM | POA: Diagnosis not present

## 2023-04-19 DIAGNOSIS — R2681 Unsteadiness on feet: Secondary | ICD-10-CM | POA: Diagnosis not present

## 2023-04-19 DIAGNOSIS — R627 Adult failure to thrive: Secondary | ICD-10-CM | POA: Diagnosis not present

## 2023-04-19 DIAGNOSIS — G20A1 Parkinson's disease without dyskinesia, without mention of fluctuations: Secondary | ICD-10-CM | POA: Diagnosis not present

## 2023-04-19 DIAGNOSIS — Z741 Need for assistance with personal care: Secondary | ICD-10-CM | POA: Diagnosis not present

## 2023-04-19 DIAGNOSIS — M6281 Muscle weakness (generalized): Secondary | ICD-10-CM | POA: Diagnosis not present

## 2023-04-20 DIAGNOSIS — G20A1 Parkinson's disease without dyskinesia, without mention of fluctuations: Secondary | ICD-10-CM | POA: Diagnosis not present

## 2023-04-20 DIAGNOSIS — R2681 Unsteadiness on feet: Secondary | ICD-10-CM | POA: Diagnosis not present

## 2023-04-20 DIAGNOSIS — R627 Adult failure to thrive: Secondary | ICD-10-CM | POA: Diagnosis not present

## 2023-04-20 DIAGNOSIS — Z741 Need for assistance with personal care: Secondary | ICD-10-CM | POA: Diagnosis not present

## 2023-04-20 DIAGNOSIS — M6281 Muscle weakness (generalized): Secondary | ICD-10-CM | POA: Diagnosis not present

## 2023-04-23 DIAGNOSIS — Z741 Need for assistance with personal care: Secondary | ICD-10-CM | POA: Diagnosis not present

## 2023-04-23 DIAGNOSIS — I10 Essential (primary) hypertension: Secondary | ICD-10-CM | POA: Diagnosis not present

## 2023-04-23 DIAGNOSIS — F0390 Unspecified dementia without behavioral disturbance: Secondary | ICD-10-CM | POA: Diagnosis not present

## 2023-04-23 DIAGNOSIS — R2681 Unsteadiness on feet: Secondary | ICD-10-CM | POA: Diagnosis not present

## 2023-04-23 DIAGNOSIS — M6281 Muscle weakness (generalized): Secondary | ICD-10-CM | POA: Diagnosis not present

## 2023-04-23 DIAGNOSIS — R627 Adult failure to thrive: Secondary | ICD-10-CM | POA: Diagnosis not present

## 2023-04-23 DIAGNOSIS — E782 Mixed hyperlipidemia: Secondary | ICD-10-CM | POA: Diagnosis not present

## 2023-04-23 DIAGNOSIS — G20A1 Parkinson's disease without dyskinesia, without mention of fluctuations: Secondary | ICD-10-CM | POA: Diagnosis not present

## 2023-04-23 DIAGNOSIS — G4089 Other seizures: Secondary | ICD-10-CM | POA: Diagnosis not present

## 2023-04-24 DIAGNOSIS — F0390 Unspecified dementia without behavioral disturbance: Secondary | ICD-10-CM | POA: Diagnosis not present

## 2023-04-24 DIAGNOSIS — Z741 Need for assistance with personal care: Secondary | ICD-10-CM | POA: Diagnosis not present

## 2023-04-24 DIAGNOSIS — R627 Adult failure to thrive: Secondary | ICD-10-CM | POA: Diagnosis not present

## 2023-04-24 DIAGNOSIS — R2681 Unsteadiness on feet: Secondary | ICD-10-CM | POA: Diagnosis not present

## 2023-04-24 DIAGNOSIS — E782 Mixed hyperlipidemia: Secondary | ICD-10-CM | POA: Diagnosis not present

## 2023-04-24 DIAGNOSIS — G20A1 Parkinson's disease without dyskinesia, without mention of fluctuations: Secondary | ICD-10-CM | POA: Diagnosis not present

## 2023-04-24 DIAGNOSIS — M6281 Muscle weakness (generalized): Secondary | ICD-10-CM | POA: Diagnosis not present

## 2023-04-25 DIAGNOSIS — R2681 Unsteadiness on feet: Secondary | ICD-10-CM | POA: Diagnosis not present

## 2023-04-25 DIAGNOSIS — F0390 Unspecified dementia without behavioral disturbance: Secondary | ICD-10-CM | POA: Diagnosis not present

## 2023-04-25 DIAGNOSIS — G20A1 Parkinson's disease without dyskinesia, without mention of fluctuations: Secondary | ICD-10-CM | POA: Diagnosis not present

## 2023-04-25 DIAGNOSIS — M6281 Muscle weakness (generalized): Secondary | ICD-10-CM | POA: Diagnosis not present

## 2023-04-25 DIAGNOSIS — R627 Adult failure to thrive: Secondary | ICD-10-CM | POA: Diagnosis not present

## 2023-04-25 DIAGNOSIS — Z741 Need for assistance with personal care: Secondary | ICD-10-CM | POA: Diagnosis not present

## 2023-04-26 DIAGNOSIS — M6281 Muscle weakness (generalized): Secondary | ICD-10-CM | POA: Diagnosis not present

## 2023-04-26 DIAGNOSIS — R2681 Unsteadiness on feet: Secondary | ICD-10-CM | POA: Diagnosis not present

## 2023-04-26 DIAGNOSIS — R627 Adult failure to thrive: Secondary | ICD-10-CM | POA: Diagnosis not present

## 2023-04-26 DIAGNOSIS — F0390 Unspecified dementia without behavioral disturbance: Secondary | ICD-10-CM | POA: Diagnosis not present

## 2023-04-26 DIAGNOSIS — Z741 Need for assistance with personal care: Secondary | ICD-10-CM | POA: Diagnosis not present

## 2023-04-26 DIAGNOSIS — G20A1 Parkinson's disease without dyskinesia, without mention of fluctuations: Secondary | ICD-10-CM | POA: Diagnosis not present

## 2023-04-27 DIAGNOSIS — M6281 Muscle weakness (generalized): Secondary | ICD-10-CM | POA: Diagnosis not present

## 2023-04-27 DIAGNOSIS — R2681 Unsteadiness on feet: Secondary | ICD-10-CM | POA: Diagnosis not present

## 2023-04-27 DIAGNOSIS — G20A1 Parkinson's disease without dyskinesia, without mention of fluctuations: Secondary | ICD-10-CM | POA: Diagnosis not present

## 2023-04-27 DIAGNOSIS — Z741 Need for assistance with personal care: Secondary | ICD-10-CM | POA: Diagnosis not present

## 2023-04-27 DIAGNOSIS — R627 Adult failure to thrive: Secondary | ICD-10-CM | POA: Diagnosis not present

## 2023-04-27 DIAGNOSIS — F0390 Unspecified dementia without behavioral disturbance: Secondary | ICD-10-CM | POA: Diagnosis not present

## 2023-04-30 DIAGNOSIS — Z741 Need for assistance with personal care: Secondary | ICD-10-CM | POA: Diagnosis not present

## 2023-04-30 DIAGNOSIS — F0390 Unspecified dementia without behavioral disturbance: Secondary | ICD-10-CM | POA: Diagnosis not present

## 2023-04-30 DIAGNOSIS — G20A1 Parkinson's disease without dyskinesia, without mention of fluctuations: Secondary | ICD-10-CM | POA: Diagnosis not present

## 2023-04-30 DIAGNOSIS — R627 Adult failure to thrive: Secondary | ICD-10-CM | POA: Diagnosis not present

## 2023-04-30 DIAGNOSIS — R2681 Unsteadiness on feet: Secondary | ICD-10-CM | POA: Diagnosis not present

## 2023-04-30 DIAGNOSIS — M6281 Muscle weakness (generalized): Secondary | ICD-10-CM | POA: Diagnosis not present

## 2023-05-01 DIAGNOSIS — Z741 Need for assistance with personal care: Secondary | ICD-10-CM | POA: Diagnosis not present

## 2023-05-01 DIAGNOSIS — F0390 Unspecified dementia without behavioral disturbance: Secondary | ICD-10-CM | POA: Diagnosis not present

## 2023-05-01 DIAGNOSIS — G20A1 Parkinson's disease without dyskinesia, without mention of fluctuations: Secondary | ICD-10-CM | POA: Diagnosis not present

## 2023-05-01 DIAGNOSIS — M6281 Muscle weakness (generalized): Secondary | ICD-10-CM | POA: Diagnosis not present

## 2023-05-01 DIAGNOSIS — R2681 Unsteadiness on feet: Secondary | ICD-10-CM | POA: Diagnosis not present

## 2023-05-01 DIAGNOSIS — R627 Adult failure to thrive: Secondary | ICD-10-CM | POA: Diagnosis not present

## 2023-05-02 DIAGNOSIS — M6281 Muscle weakness (generalized): Secondary | ICD-10-CM | POA: Diagnosis not present

## 2023-05-02 DIAGNOSIS — Z741 Need for assistance with personal care: Secondary | ICD-10-CM | POA: Diagnosis not present

## 2023-05-02 DIAGNOSIS — R627 Adult failure to thrive: Secondary | ICD-10-CM | POA: Diagnosis not present

## 2023-05-02 DIAGNOSIS — G20A1 Parkinson's disease without dyskinesia, without mention of fluctuations: Secondary | ICD-10-CM | POA: Diagnosis not present

## 2023-05-02 DIAGNOSIS — F0390 Unspecified dementia without behavioral disturbance: Secondary | ICD-10-CM | POA: Diagnosis not present

## 2023-05-02 DIAGNOSIS — R2681 Unsteadiness on feet: Secondary | ICD-10-CM | POA: Diagnosis not present

## 2023-05-03 DIAGNOSIS — R2681 Unsteadiness on feet: Secondary | ICD-10-CM | POA: Diagnosis not present

## 2023-05-03 DIAGNOSIS — M6281 Muscle weakness (generalized): Secondary | ICD-10-CM | POA: Diagnosis not present

## 2023-05-03 DIAGNOSIS — Z741 Need for assistance with personal care: Secondary | ICD-10-CM | POA: Diagnosis not present

## 2023-05-03 DIAGNOSIS — F0390 Unspecified dementia without behavioral disturbance: Secondary | ICD-10-CM | POA: Diagnosis not present

## 2023-05-03 DIAGNOSIS — R627 Adult failure to thrive: Secondary | ICD-10-CM | POA: Diagnosis not present

## 2023-05-03 DIAGNOSIS — G20A1 Parkinson's disease without dyskinesia, without mention of fluctuations: Secondary | ICD-10-CM | POA: Diagnosis not present

## 2023-05-04 DIAGNOSIS — R627 Adult failure to thrive: Secondary | ICD-10-CM | POA: Diagnosis not present

## 2023-05-04 DIAGNOSIS — F0390 Unspecified dementia without behavioral disturbance: Secondary | ICD-10-CM | POA: Diagnosis not present

## 2023-05-04 DIAGNOSIS — M6281 Muscle weakness (generalized): Secondary | ICD-10-CM | POA: Diagnosis not present

## 2023-05-04 DIAGNOSIS — Z741 Need for assistance with personal care: Secondary | ICD-10-CM | POA: Diagnosis not present

## 2023-05-04 DIAGNOSIS — G20A1 Parkinson's disease without dyskinesia, without mention of fluctuations: Secondary | ICD-10-CM | POA: Diagnosis not present

## 2023-05-04 DIAGNOSIS — R2681 Unsteadiness on feet: Secondary | ICD-10-CM | POA: Diagnosis not present

## 2023-05-05 DIAGNOSIS — M6281 Muscle weakness (generalized): Secondary | ICD-10-CM | POA: Diagnosis not present

## 2023-05-05 DIAGNOSIS — R627 Adult failure to thrive: Secondary | ICD-10-CM | POA: Diagnosis not present

## 2023-05-05 DIAGNOSIS — Z741 Need for assistance with personal care: Secondary | ICD-10-CM | POA: Diagnosis not present

## 2023-05-05 DIAGNOSIS — G20A1 Parkinson's disease without dyskinesia, without mention of fluctuations: Secondary | ICD-10-CM | POA: Diagnosis not present

## 2023-05-05 DIAGNOSIS — R2681 Unsteadiness on feet: Secondary | ICD-10-CM | POA: Diagnosis not present

## 2023-05-05 DIAGNOSIS — F0390 Unspecified dementia without behavioral disturbance: Secondary | ICD-10-CM | POA: Diagnosis not present

## 2023-05-07 DIAGNOSIS — R2681 Unsteadiness on feet: Secondary | ICD-10-CM | POA: Diagnosis not present

## 2023-05-07 DIAGNOSIS — G20A1 Parkinson's disease without dyskinesia, without mention of fluctuations: Secondary | ICD-10-CM | POA: Diagnosis not present

## 2023-05-07 DIAGNOSIS — Z741 Need for assistance with personal care: Secondary | ICD-10-CM | POA: Diagnosis not present

## 2023-05-07 DIAGNOSIS — M6281 Muscle weakness (generalized): Secondary | ICD-10-CM | POA: Diagnosis not present

## 2023-05-07 DIAGNOSIS — R627 Adult failure to thrive: Secondary | ICD-10-CM | POA: Diagnosis not present

## 2023-05-07 DIAGNOSIS — F0390 Unspecified dementia without behavioral disturbance: Secondary | ICD-10-CM | POA: Diagnosis not present

## 2023-05-08 DIAGNOSIS — F0390 Unspecified dementia without behavioral disturbance: Secondary | ICD-10-CM | POA: Diagnosis not present

## 2023-05-08 DIAGNOSIS — G20A1 Parkinson's disease without dyskinesia, without mention of fluctuations: Secondary | ICD-10-CM | POA: Diagnosis not present

## 2023-05-08 DIAGNOSIS — R2681 Unsteadiness on feet: Secondary | ICD-10-CM | POA: Diagnosis not present

## 2023-05-08 DIAGNOSIS — M6281 Muscle weakness (generalized): Secondary | ICD-10-CM | POA: Diagnosis not present

## 2023-05-08 DIAGNOSIS — Z741 Need for assistance with personal care: Secondary | ICD-10-CM | POA: Diagnosis not present

## 2023-05-08 DIAGNOSIS — R627 Adult failure to thrive: Secondary | ICD-10-CM | POA: Diagnosis not present

## 2023-05-09 DIAGNOSIS — G20A1 Parkinson's disease without dyskinesia, without mention of fluctuations: Secondary | ICD-10-CM | POA: Diagnosis not present

## 2023-05-09 DIAGNOSIS — R627 Adult failure to thrive: Secondary | ICD-10-CM | POA: Diagnosis not present

## 2023-05-09 DIAGNOSIS — F0390 Unspecified dementia without behavioral disturbance: Secondary | ICD-10-CM | POA: Diagnosis not present

## 2023-05-09 DIAGNOSIS — R2681 Unsteadiness on feet: Secondary | ICD-10-CM | POA: Diagnosis not present

## 2023-05-09 DIAGNOSIS — M6281 Muscle weakness (generalized): Secondary | ICD-10-CM | POA: Diagnosis not present

## 2023-05-09 DIAGNOSIS — Z741 Need for assistance with personal care: Secondary | ICD-10-CM | POA: Diagnosis not present

## 2023-05-10 DIAGNOSIS — M6281 Muscle weakness (generalized): Secondary | ICD-10-CM | POA: Diagnosis not present

## 2023-05-10 DIAGNOSIS — F0390 Unspecified dementia without behavioral disturbance: Secondary | ICD-10-CM | POA: Diagnosis not present

## 2023-05-10 DIAGNOSIS — R627 Adult failure to thrive: Secondary | ICD-10-CM | POA: Diagnosis not present

## 2023-05-10 DIAGNOSIS — G20A1 Parkinson's disease without dyskinesia, without mention of fluctuations: Secondary | ICD-10-CM | POA: Diagnosis not present

## 2023-05-10 DIAGNOSIS — R2681 Unsteadiness on feet: Secondary | ICD-10-CM | POA: Diagnosis not present

## 2023-05-10 DIAGNOSIS — Z741 Need for assistance with personal care: Secondary | ICD-10-CM | POA: Diagnosis not present

## 2023-05-11 DIAGNOSIS — M6281 Muscle weakness (generalized): Secondary | ICD-10-CM | POA: Diagnosis not present

## 2023-05-11 DIAGNOSIS — G20A1 Parkinson's disease without dyskinesia, without mention of fluctuations: Secondary | ICD-10-CM | POA: Diagnosis not present

## 2023-05-11 DIAGNOSIS — R627 Adult failure to thrive: Secondary | ICD-10-CM | POA: Diagnosis not present

## 2023-05-11 DIAGNOSIS — F0390 Unspecified dementia without behavioral disturbance: Secondary | ICD-10-CM | POA: Diagnosis not present

## 2023-05-11 DIAGNOSIS — R2681 Unsteadiness on feet: Secondary | ICD-10-CM | POA: Diagnosis not present

## 2023-05-11 DIAGNOSIS — Z741 Need for assistance with personal care: Secondary | ICD-10-CM | POA: Diagnosis not present

## 2023-05-14 DIAGNOSIS — G20A1 Parkinson's disease without dyskinesia, without mention of fluctuations: Secondary | ICD-10-CM | POA: Diagnosis not present

## 2023-05-14 DIAGNOSIS — Z741 Need for assistance with personal care: Secondary | ICD-10-CM | POA: Diagnosis not present

## 2023-05-14 DIAGNOSIS — F0390 Unspecified dementia without behavioral disturbance: Secondary | ICD-10-CM | POA: Diagnosis not present

## 2023-05-14 DIAGNOSIS — M6281 Muscle weakness (generalized): Secondary | ICD-10-CM | POA: Diagnosis not present

## 2023-05-14 DIAGNOSIS — R627 Adult failure to thrive: Secondary | ICD-10-CM | POA: Diagnosis not present

## 2023-05-14 DIAGNOSIS — R2681 Unsteadiness on feet: Secondary | ICD-10-CM | POA: Diagnosis not present

## 2023-05-15 DIAGNOSIS — G20A1 Parkinson's disease without dyskinesia, without mention of fluctuations: Secondary | ICD-10-CM | POA: Diagnosis not present

## 2023-05-15 DIAGNOSIS — R2681 Unsteadiness on feet: Secondary | ICD-10-CM | POA: Diagnosis not present

## 2023-05-15 DIAGNOSIS — Z741 Need for assistance with personal care: Secondary | ICD-10-CM | POA: Diagnosis not present

## 2023-05-15 DIAGNOSIS — R627 Adult failure to thrive: Secondary | ICD-10-CM | POA: Diagnosis not present

## 2023-05-15 DIAGNOSIS — M6281 Muscle weakness (generalized): Secondary | ICD-10-CM | POA: Diagnosis not present

## 2023-05-15 DIAGNOSIS — F0390 Unspecified dementia without behavioral disturbance: Secondary | ICD-10-CM | POA: Diagnosis not present

## 2023-05-16 DIAGNOSIS — G20A1 Parkinson's disease without dyskinesia, without mention of fluctuations: Secondary | ICD-10-CM | POA: Diagnosis not present

## 2023-05-16 DIAGNOSIS — R627 Adult failure to thrive: Secondary | ICD-10-CM | POA: Diagnosis not present

## 2023-05-16 DIAGNOSIS — R2681 Unsteadiness on feet: Secondary | ICD-10-CM | POA: Diagnosis not present

## 2023-05-16 DIAGNOSIS — Z741 Need for assistance with personal care: Secondary | ICD-10-CM | POA: Diagnosis not present

## 2023-05-16 DIAGNOSIS — M6281 Muscle weakness (generalized): Secondary | ICD-10-CM | POA: Diagnosis not present

## 2023-05-16 DIAGNOSIS — F0390 Unspecified dementia without behavioral disturbance: Secondary | ICD-10-CM | POA: Diagnosis not present

## 2023-05-17 ENCOUNTER — Other Ambulatory Visit: Payer: Self-pay | Admitting: Neurology

## 2023-05-17 DIAGNOSIS — Z741 Need for assistance with personal care: Secondary | ICD-10-CM | POA: Diagnosis not present

## 2023-05-17 DIAGNOSIS — F03B18 Unspecified dementia, moderate, with other behavioral disturbance: Secondary | ICD-10-CM | POA: Diagnosis not present

## 2023-05-17 DIAGNOSIS — M6281 Muscle weakness (generalized): Secondary | ICD-10-CM | POA: Diagnosis not present

## 2023-05-17 DIAGNOSIS — D649 Anemia, unspecified: Secondary | ICD-10-CM | POA: Diagnosis not present

## 2023-05-17 DIAGNOSIS — R2681 Unsteadiness on feet: Secondary | ICD-10-CM | POA: Diagnosis not present

## 2023-05-17 DIAGNOSIS — I10 Essential (primary) hypertension: Secondary | ICD-10-CM | POA: Diagnosis not present

## 2023-05-17 DIAGNOSIS — G20A1 Parkinson's disease without dyskinesia, without mention of fluctuations: Secondary | ICD-10-CM | POA: Diagnosis not present

## 2023-05-17 DIAGNOSIS — R627 Adult failure to thrive: Secondary | ICD-10-CM | POA: Diagnosis not present

## 2023-05-17 DIAGNOSIS — G20C Parkinsonism, unspecified: Secondary | ICD-10-CM | POA: Diagnosis not present

## 2023-05-17 DIAGNOSIS — F0390 Unspecified dementia without behavioral disturbance: Secondary | ICD-10-CM | POA: Diagnosis not present

## 2023-05-18 DIAGNOSIS — M6281 Muscle weakness (generalized): Secondary | ICD-10-CM | POA: Diagnosis not present

## 2023-05-18 DIAGNOSIS — F0390 Unspecified dementia without behavioral disturbance: Secondary | ICD-10-CM | POA: Diagnosis not present

## 2023-05-18 DIAGNOSIS — R2681 Unsteadiness on feet: Secondary | ICD-10-CM | POA: Diagnosis not present

## 2023-05-18 DIAGNOSIS — G20A1 Parkinson's disease without dyskinesia, without mention of fluctuations: Secondary | ICD-10-CM | POA: Diagnosis not present

## 2023-05-18 DIAGNOSIS — Z741 Need for assistance with personal care: Secondary | ICD-10-CM | POA: Diagnosis not present

## 2023-05-18 DIAGNOSIS — R627 Adult failure to thrive: Secondary | ICD-10-CM | POA: Diagnosis not present

## 2023-05-21 DIAGNOSIS — R2681 Unsteadiness on feet: Secondary | ICD-10-CM | POA: Diagnosis not present

## 2023-05-21 DIAGNOSIS — F0390 Unspecified dementia without behavioral disturbance: Secondary | ICD-10-CM | POA: Diagnosis not present

## 2023-05-21 DIAGNOSIS — M6281 Muscle weakness (generalized): Secondary | ICD-10-CM | POA: Diagnosis not present

## 2023-05-21 DIAGNOSIS — R627 Adult failure to thrive: Secondary | ICD-10-CM | POA: Diagnosis not present

## 2023-05-21 DIAGNOSIS — Z741 Need for assistance with personal care: Secondary | ICD-10-CM | POA: Diagnosis not present

## 2023-05-21 DIAGNOSIS — G20A1 Parkinson's disease without dyskinesia, without mention of fluctuations: Secondary | ICD-10-CM | POA: Diagnosis not present

## 2023-05-22 DIAGNOSIS — F0390 Unspecified dementia without behavioral disturbance: Secondary | ICD-10-CM | POA: Diagnosis not present

## 2023-05-22 DIAGNOSIS — R627 Adult failure to thrive: Secondary | ICD-10-CM | POA: Diagnosis not present

## 2023-05-22 DIAGNOSIS — Z741 Need for assistance with personal care: Secondary | ICD-10-CM | POA: Diagnosis not present

## 2023-05-22 DIAGNOSIS — R2681 Unsteadiness on feet: Secondary | ICD-10-CM | POA: Diagnosis not present

## 2023-05-22 DIAGNOSIS — M6281 Muscle weakness (generalized): Secondary | ICD-10-CM | POA: Diagnosis not present

## 2023-05-22 DIAGNOSIS — G20A1 Parkinson's disease without dyskinesia, without mention of fluctuations: Secondary | ICD-10-CM | POA: Diagnosis not present

## 2023-05-23 DIAGNOSIS — F0390 Unspecified dementia without behavioral disturbance: Secondary | ICD-10-CM | POA: Diagnosis not present

## 2023-05-23 DIAGNOSIS — R627 Adult failure to thrive: Secondary | ICD-10-CM | POA: Diagnosis not present

## 2023-05-23 DIAGNOSIS — M6281 Muscle weakness (generalized): Secondary | ICD-10-CM | POA: Diagnosis not present

## 2023-05-23 DIAGNOSIS — R2681 Unsteadiness on feet: Secondary | ICD-10-CM | POA: Diagnosis not present

## 2023-05-23 DIAGNOSIS — Z741 Need for assistance with personal care: Secondary | ICD-10-CM | POA: Diagnosis not present

## 2023-05-23 DIAGNOSIS — G20A1 Parkinson's disease without dyskinesia, without mention of fluctuations: Secondary | ICD-10-CM | POA: Diagnosis not present

## 2023-05-24 DIAGNOSIS — M6281 Muscle weakness (generalized): Secondary | ICD-10-CM | POA: Diagnosis not present

## 2023-05-24 DIAGNOSIS — F0390 Unspecified dementia without behavioral disturbance: Secondary | ICD-10-CM | POA: Diagnosis not present

## 2023-05-24 DIAGNOSIS — G20A1 Parkinson's disease without dyskinesia, without mention of fluctuations: Secondary | ICD-10-CM | POA: Diagnosis not present

## 2023-05-24 DIAGNOSIS — Z741 Need for assistance with personal care: Secondary | ICD-10-CM | POA: Diagnosis not present

## 2023-05-24 DIAGNOSIS — R2681 Unsteadiness on feet: Secondary | ICD-10-CM | POA: Diagnosis not present

## 2023-05-24 DIAGNOSIS — R627 Adult failure to thrive: Secondary | ICD-10-CM | POA: Diagnosis not present

## 2023-05-25 DIAGNOSIS — M6281 Muscle weakness (generalized): Secondary | ICD-10-CM | POA: Diagnosis not present

## 2023-05-25 DIAGNOSIS — G20A1 Parkinson's disease without dyskinesia, without mention of fluctuations: Secondary | ICD-10-CM | POA: Diagnosis not present

## 2023-05-25 DIAGNOSIS — R627 Adult failure to thrive: Secondary | ICD-10-CM | POA: Diagnosis not present

## 2023-05-25 DIAGNOSIS — R2681 Unsteadiness on feet: Secondary | ICD-10-CM | POA: Diagnosis not present

## 2023-05-25 DIAGNOSIS — Z741 Need for assistance with personal care: Secondary | ICD-10-CM | POA: Diagnosis not present

## 2023-05-25 DIAGNOSIS — F0390 Unspecified dementia without behavioral disturbance: Secondary | ICD-10-CM | POA: Diagnosis not present

## 2023-05-28 DIAGNOSIS — R627 Adult failure to thrive: Secondary | ICD-10-CM | POA: Diagnosis not present

## 2023-05-28 DIAGNOSIS — G20A1 Parkinson's disease without dyskinesia, without mention of fluctuations: Secondary | ICD-10-CM | POA: Diagnosis not present

## 2023-05-28 DIAGNOSIS — Z741 Need for assistance with personal care: Secondary | ICD-10-CM | POA: Diagnosis not present

## 2023-05-28 DIAGNOSIS — M6281 Muscle weakness (generalized): Secondary | ICD-10-CM | POA: Diagnosis not present

## 2023-05-28 DIAGNOSIS — R2681 Unsteadiness on feet: Secondary | ICD-10-CM | POA: Diagnosis not present

## 2023-05-28 DIAGNOSIS — F0390 Unspecified dementia without behavioral disturbance: Secondary | ICD-10-CM | POA: Diagnosis not present

## 2023-05-29 DIAGNOSIS — R627 Adult failure to thrive: Secondary | ICD-10-CM | POA: Diagnosis not present

## 2023-05-29 DIAGNOSIS — M6281 Muscle weakness (generalized): Secondary | ICD-10-CM | POA: Diagnosis not present

## 2023-05-29 DIAGNOSIS — R2681 Unsteadiness on feet: Secondary | ICD-10-CM | POA: Diagnosis not present

## 2023-05-29 DIAGNOSIS — Z741 Need for assistance with personal care: Secondary | ICD-10-CM | POA: Diagnosis not present

## 2023-05-29 DIAGNOSIS — F0390 Unspecified dementia without behavioral disturbance: Secondary | ICD-10-CM | POA: Diagnosis not present

## 2023-05-29 DIAGNOSIS — G20A1 Parkinson's disease without dyskinesia, without mention of fluctuations: Secondary | ICD-10-CM | POA: Diagnosis not present

## 2023-05-30 DIAGNOSIS — G20A1 Parkinson's disease without dyskinesia, without mention of fluctuations: Secondary | ICD-10-CM | POA: Diagnosis not present

## 2023-05-30 DIAGNOSIS — F0390 Unspecified dementia without behavioral disturbance: Secondary | ICD-10-CM | POA: Diagnosis not present

## 2023-05-30 DIAGNOSIS — M6281 Muscle weakness (generalized): Secondary | ICD-10-CM | POA: Diagnosis not present

## 2023-05-30 DIAGNOSIS — R627 Adult failure to thrive: Secondary | ICD-10-CM | POA: Diagnosis not present

## 2023-05-30 DIAGNOSIS — R2681 Unsteadiness on feet: Secondary | ICD-10-CM | POA: Diagnosis not present

## 2023-05-30 DIAGNOSIS — Z741 Need for assistance with personal care: Secondary | ICD-10-CM | POA: Diagnosis not present

## 2023-05-31 DIAGNOSIS — Z741 Need for assistance with personal care: Secondary | ICD-10-CM | POA: Diagnosis not present

## 2023-05-31 DIAGNOSIS — M6281 Muscle weakness (generalized): Secondary | ICD-10-CM | POA: Diagnosis not present

## 2023-05-31 DIAGNOSIS — F0390 Unspecified dementia without behavioral disturbance: Secondary | ICD-10-CM | POA: Diagnosis not present

## 2023-05-31 DIAGNOSIS — R627 Adult failure to thrive: Secondary | ICD-10-CM | POA: Diagnosis not present

## 2023-05-31 DIAGNOSIS — R2681 Unsteadiness on feet: Secondary | ICD-10-CM | POA: Diagnosis not present

## 2023-05-31 DIAGNOSIS — G20A1 Parkinson's disease without dyskinesia, without mention of fluctuations: Secondary | ICD-10-CM | POA: Diagnosis not present

## 2023-06-01 DIAGNOSIS — G20A1 Parkinson's disease without dyskinesia, without mention of fluctuations: Secondary | ICD-10-CM | POA: Diagnosis not present

## 2023-06-01 DIAGNOSIS — F0390 Unspecified dementia without behavioral disturbance: Secondary | ICD-10-CM | POA: Diagnosis not present

## 2023-06-01 DIAGNOSIS — R627 Adult failure to thrive: Secondary | ICD-10-CM | POA: Diagnosis not present

## 2023-06-01 DIAGNOSIS — R2681 Unsteadiness on feet: Secondary | ICD-10-CM | POA: Diagnosis not present

## 2023-06-01 DIAGNOSIS — M6281 Muscle weakness (generalized): Secondary | ICD-10-CM | POA: Diagnosis not present

## 2023-06-01 DIAGNOSIS — Z741 Need for assistance with personal care: Secondary | ICD-10-CM | POA: Diagnosis not present

## 2023-06-04 DIAGNOSIS — G20A1 Parkinson's disease without dyskinesia, without mention of fluctuations: Secondary | ICD-10-CM | POA: Diagnosis not present

## 2023-06-04 DIAGNOSIS — M6281 Muscle weakness (generalized): Secondary | ICD-10-CM | POA: Diagnosis not present

## 2023-06-04 DIAGNOSIS — R2681 Unsteadiness on feet: Secondary | ICD-10-CM | POA: Diagnosis not present

## 2023-06-04 DIAGNOSIS — Z741 Need for assistance with personal care: Secondary | ICD-10-CM | POA: Diagnosis not present

## 2023-06-04 DIAGNOSIS — R627 Adult failure to thrive: Secondary | ICD-10-CM | POA: Diagnosis not present

## 2023-06-04 DIAGNOSIS — F0390 Unspecified dementia without behavioral disturbance: Secondary | ICD-10-CM | POA: Diagnosis not present

## 2023-06-05 DIAGNOSIS — R627 Adult failure to thrive: Secondary | ICD-10-CM | POA: Diagnosis not present

## 2023-06-05 DIAGNOSIS — R2681 Unsteadiness on feet: Secondary | ICD-10-CM | POA: Diagnosis not present

## 2023-06-05 DIAGNOSIS — Z741 Need for assistance with personal care: Secondary | ICD-10-CM | POA: Diagnosis not present

## 2023-06-05 DIAGNOSIS — M6281 Muscle weakness (generalized): Secondary | ICD-10-CM | POA: Diagnosis not present

## 2023-06-05 DIAGNOSIS — F0390 Unspecified dementia without behavioral disturbance: Secondary | ICD-10-CM | POA: Diagnosis not present

## 2023-06-05 DIAGNOSIS — G20A1 Parkinson's disease without dyskinesia, without mention of fluctuations: Secondary | ICD-10-CM | POA: Diagnosis not present

## 2023-06-06 DIAGNOSIS — G20A1 Parkinson's disease without dyskinesia, without mention of fluctuations: Secondary | ICD-10-CM | POA: Diagnosis not present

## 2023-06-06 DIAGNOSIS — Z741 Need for assistance with personal care: Secondary | ICD-10-CM | POA: Diagnosis not present

## 2023-06-06 DIAGNOSIS — R627 Adult failure to thrive: Secondary | ICD-10-CM | POA: Diagnosis not present

## 2023-06-06 DIAGNOSIS — M6281 Muscle weakness (generalized): Secondary | ICD-10-CM | POA: Diagnosis not present

## 2023-06-06 DIAGNOSIS — R2681 Unsteadiness on feet: Secondary | ICD-10-CM | POA: Diagnosis not present

## 2023-06-06 DIAGNOSIS — F0390 Unspecified dementia without behavioral disturbance: Secondary | ICD-10-CM | POA: Diagnosis not present

## 2023-06-07 DIAGNOSIS — Z741 Need for assistance with personal care: Secondary | ICD-10-CM | POA: Diagnosis not present

## 2023-06-07 DIAGNOSIS — M6281 Muscle weakness (generalized): Secondary | ICD-10-CM | POA: Diagnosis not present

## 2023-06-07 DIAGNOSIS — F0390 Unspecified dementia without behavioral disturbance: Secondary | ICD-10-CM | POA: Diagnosis not present

## 2023-06-07 DIAGNOSIS — G20A1 Parkinson's disease without dyskinesia, without mention of fluctuations: Secondary | ICD-10-CM | POA: Diagnosis not present

## 2023-06-07 DIAGNOSIS — R2681 Unsteadiness on feet: Secondary | ICD-10-CM | POA: Diagnosis not present

## 2023-06-07 DIAGNOSIS — R627 Adult failure to thrive: Secondary | ICD-10-CM | POA: Diagnosis not present

## 2023-06-08 DIAGNOSIS — R627 Adult failure to thrive: Secondary | ICD-10-CM | POA: Diagnosis not present

## 2023-06-08 DIAGNOSIS — Z741 Need for assistance with personal care: Secondary | ICD-10-CM | POA: Diagnosis not present

## 2023-06-08 DIAGNOSIS — M6281 Muscle weakness (generalized): Secondary | ICD-10-CM | POA: Diagnosis not present

## 2023-06-08 DIAGNOSIS — G20A1 Parkinson's disease without dyskinesia, without mention of fluctuations: Secondary | ICD-10-CM | POA: Diagnosis not present

## 2023-06-08 DIAGNOSIS — F0390 Unspecified dementia without behavioral disturbance: Secondary | ICD-10-CM | POA: Diagnosis not present

## 2023-06-08 DIAGNOSIS — R2681 Unsteadiness on feet: Secondary | ICD-10-CM | POA: Diagnosis not present

## 2023-06-11 DIAGNOSIS — F0390 Unspecified dementia without behavioral disturbance: Secondary | ICD-10-CM | POA: Diagnosis not present

## 2023-06-11 DIAGNOSIS — G20A1 Parkinson's disease without dyskinesia, without mention of fluctuations: Secondary | ICD-10-CM | POA: Diagnosis not present

## 2023-06-11 DIAGNOSIS — M6281 Muscle weakness (generalized): Secondary | ICD-10-CM | POA: Diagnosis not present

## 2023-06-11 DIAGNOSIS — Z741 Need for assistance with personal care: Secondary | ICD-10-CM | POA: Diagnosis not present

## 2023-06-11 DIAGNOSIS — R627 Adult failure to thrive: Secondary | ICD-10-CM | POA: Diagnosis not present

## 2023-06-11 DIAGNOSIS — R2681 Unsteadiness on feet: Secondary | ICD-10-CM | POA: Diagnosis not present

## 2023-06-12 DIAGNOSIS — F03B18 Unspecified dementia, moderate, with other behavioral disturbance: Secondary | ICD-10-CM | POA: Diagnosis not present

## 2023-06-12 DIAGNOSIS — G20A1 Parkinson's disease without dyskinesia, without mention of fluctuations: Secondary | ICD-10-CM | POA: Diagnosis not present

## 2023-06-13 DIAGNOSIS — E782 Mixed hyperlipidemia: Secondary | ICD-10-CM | POA: Diagnosis not present

## 2023-06-13 DIAGNOSIS — G20C Parkinsonism, unspecified: Secondary | ICD-10-CM | POA: Diagnosis not present

## 2023-06-13 DIAGNOSIS — G40909 Epilepsy, unspecified, not intractable, without status epilepticus: Secondary | ICD-10-CM | POA: Diagnosis not present

## 2023-06-13 DIAGNOSIS — F39 Unspecified mood [affective] disorder: Secondary | ICD-10-CM | POA: Diagnosis not present

## 2023-06-13 DIAGNOSIS — I10 Essential (primary) hypertension: Secondary | ICD-10-CM | POA: Diagnosis not present

## 2023-06-13 DIAGNOSIS — I25118 Atherosclerotic heart disease of native coronary artery with other forms of angina pectoris: Secondary | ICD-10-CM | POA: Diagnosis not present

## 2023-06-13 DIAGNOSIS — F03918 Unspecified dementia, unspecified severity, with other behavioral disturbance: Secondary | ICD-10-CM | POA: Diagnosis not present

## 2023-06-25 DIAGNOSIS — I10 Essential (primary) hypertension: Secondary | ICD-10-CM | POA: Diagnosis not present

## 2023-07-16 NOTE — Progress Notes (Addendum)
 ASSESSMENT AND PLAN 73 y.o. year old male  1.  Parkinson's disease 2.  Dementia with behavioral issues 3.  Epilepsy  - 2 recurrent seizures January 2025 due to anxiety, sleep deprivation on Depakote  ER 1000 mg daily, Keppra  1000 mg BID.  No recurrent seizures, now living at [facility], on the above medication, Keppra  was increased from 750 mg BID in Nov 2024 due to seizure  - Check labs, blood levels of Depakote , Keppra  - May consider continuing at current dose, as alternative increase Depakote  ER 1500 mg daily  - Unable to be compliant with prolonged EEG, only able to tolerate 1 hour 6 minutes, showing intermittent right temporal focal slowing -Continue Sinemet  25/100 mg 1 tablet 4 times a day -Continue Risperdal  1 mg at night, no longer significant agitation -Call for seizure years, follow-up in 6 months with Dr. Gracie Lav to rotate every few visits with me   Addendum 07/19/23 SS: Labs showed normal CBC, alkaline phosphatase 122, Depakote  level 35, Keppra  level 29.1.  Reviewed with Dr. Gracie Lav, we will continue current dose of Depakote  and Keppra .  If he has recurrent seizure will increase Depakote  to 3 tablets at bedtime.  Level is a little low this time, please ensure facility is administering medication as prescribed.  HISTORY OF PRESENT ILLNESS: Tyrone Noble is a 73 year old male, seen in refer by his primary care doctor [name], [name] for evaluation of seizure, he is accompanied by his son in law Ames Bakes at today's clinical visit, he has lived with his daughter's family for 5 years.   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 Salli Crawley in 2017 for dementia, he did also have a history of alcohol abuse, in remission for many  years.   He presented to the emergency room on May 26, 2017 for seizure, he was ready to have breakfast, without warning signs, he began to stare into the space, not responding, whole body shaking, foaming, out of his mouth, postevent confusion last for a few minutes, paramedic was called, he was taken to the emergency room,   MRI of the brain showed no acute abnormality, generalized atrophy, moderate supratentorium small vessel disease.   Laboratory evaluation seen March 2019 showed normal CBC, BMP showed elevated creatinine 1.37, GFR of 51, negative troponin, UA was normal   He is now back to baseline, family denies significant agitations, Mini-Mental Status Examination only 18 out of 30 today   UPDATE Feb 26 2018: He is accompanied by his daughter and son in law at visit. He is overall stable,continue to complains of short term memory loss, slow walking since 2018, sleeps too much, poor appetite,    Last seizure was on Nov 30th 2019, he was awake, not responsive,  Feb 18 2018,  Sudden onset of disorientation confusion   He is taking Depakote  DR 500 mg 2 tablets every night   UPDATE Feb 06 2019: Last visit was in October 2020, he had a recurrent seizure on December 19, 2018, his Keppra  dose was increased to 1000 mg twice a day, Depakote  ER 200 mg daily   He continues to have seizure, had 2 recurrent seizure since last visit, the most recent one was on January 30, 2019, body shaking, not responsive  Laboratory evaluations December 24, 2018 showed Depakote  level of 63, Keppra  level of 38, CMP showed elevated creatinine 1.36, UDS was negative   He is taking Sinemet  25/100 mg 3 times a day, which has helped his walking,   In addition, he was noted to have worsening confusion, auditory hallucinations, scared, agitated sometimes,   He continue has mild gait abnormality, taking Sinemet  25/100 mg 3 times a day, which was helpful  UPDATE Jan 29 2020: He is accompanied by his daughter at  today's clinical visit, he has been doing well over the past few months, last reported seizure was in July 2021, was found down at bathroom, in the middle of the seizing,  He is now taking Depakote  ER 500 mg 2 tablets at bedtime, was on Keppra  500 mg 2 tablets twice a day, but by mistake, since November 2021, he was only getting Keppra  500 mg twice a day, daughter did not notice any difference, there was no recurrent seizure  He continued to attend [facility] day program 5 times a week, taking Sinemet  25/100 mg 3 times a day, also Risperdal  1 mg every night,  Daughter is overall about his current progress  UPDATE August 14th 2023: He is accompanied by his daughter and son-in-law at today's clinical visit, lives at home with his daughter, attending day program 5 days a week, overall doing well,  He had a small recurrent seizure in August 2023 after missing his levetiracetam , currently taking 500 mg twice a day along with Depakote  ER 500 mg 2 tablets every night, risperidone  1 mg every night for evening time agitations  Overall is stable,  Examination today was performed 2 hours after last dose of Sinemet , significant parkinsonian features, will increase Sinemet  25/100 from 3 to 4 tablets daily  Update May 09, 2022 SS: Here with son in Social worker. He is doing well. Last increased Sinemet  25/100 to 4 times daily, now taking 7 AM, 10 AM, 2 PM, 8 PM. Hasn't seen much change. Getting around okay, slow in the morning. Sleeping well at night, gets up 2-3 times at night. No falls. Takes 1 mg Risperdal  at night keeps him calm. still on Keppra  and Depakote . Reports 1 seizure in Dec while getting AM shower, eyes rolled back, he was shaking, confused for about 10 minutes.   Update November 08, 2022 SS: Labs February 2024 Depakote  level 72, Keppra  level 18.3.  Keppra  was increased to 750 mg twice daily due to seizure.  Hospitalized March 2024 for COVID, acute encephalopathy, EEG showed no seizures. No seizures  since Dec 2023. Still shuffles, no falls. Mood is upbeat. Sometimes gets confusion, hallucinations, not scary, not agitated. Sleeps well, agreeable to get along with, eats well. Goes to [facility] 5 days a week.   Update 07/17/23 SS:  Taken to the ER 04/04/2023 for not acting like himself at adult daycare.  Creatinine 1.40, normal ammonia, no UTI, CT head negative for acute finding.  He was rehydrated and discharged home.   Then called our office 1/16 for seizure episodes, reporting staring off episodes, slumping over being unresponsive. EEG was recommended.   Went to the ER 04/06/2023 for possible seizure.  Family felt he needed skilled facility placement.  Discussed with hospital neurologist Dr. Eual Hermes who increased his Keppra  1250 mg twice daily.  CT head was stable, creatinine 1.29, TSH 1.526, Depakote  level 68.   According to the ER note he was transferred to [facility] nursing home.   Dr. Samara Crest read his EEG (I  ordered back in November): Abnormal 48-hour EEG showing intermittent right temporal focal slowing consistent with area of neuronal dysfunction in the right temporal region.  Was abnormal for 1 hour and 6 minutes.   Impression: This is an abnormal 1 hour and 6 minutes EEG due to presence of intermittent right temporal focal slowing. This is consistent with an area of neuronal dysfunction in the right temporal region. (Couldn't wear long term EEG equip for any longer)  Here today for follow up, has remained on Keppra  1000 mg twice daily, Depakote  ER 1000 mg daily, Risperdal  1 mg daily.  Remains at [facility]. No seizures reported. Mood has been doing good. Felt 2 seizures, early this year were caused by anxiety, sleep deprivation. Memory has been declining. Denies hallucinations.   REVIEW OF SYSTEMS: Out of a complete 14 system review of symptoms, the patient complains only of the following symptoms, and all other reviewed systems are negative.  See HPI  ALLERGIES: Allergies   Allergen Reactions   Aricept  [Donepezil  Hcl] Nausea Only   Penicillins Nausea And Vomiting    Has patient had a PCN reaction causing immediate rash, facial/tongue/throat swelling, SOB or lightheadedness with hypotension: No  Has patient had a PCN reaction causing severe rash involving mucus membranes or skin necrosis: No  Has patient had a PCN reaction that required hospitalization: No  Has patient had a PCN reaction occurring within the last 10 years: No  If all of the above answers are "NO", then may proceed with Cephalosporin use.    HOME MEDICATIONS: Outpatient Medications Prior to Visit  Medication Sig Dispense Refill   aspirin  81 MG chewable tablet Chew 1 tablet (81 mg total) by mouth daily. 30 tablet 3   atorvastatin  (LIPITOR ) 20 MG tablet Take 20 mg by mouth daily.   5   carbidopa -levodopa  (SINEMET  IR) 25-250 MG tablet TAKE 1 TABLET 4 TIMES A DAY 360 tablet 4   divalproex  (DEPAKOTE  ER) 500 MG 24 hr tablet Take 2 tablets (1,000 mg total) by mouth at bedtime. 180 tablet 3   levETIRAcetam  (KEPPRA ) 1000 MG tablet Take 1 tablet (1,000 mg total) by mouth 2 (two) times daily. 180 tablet 1   losartan  (COZAAR ) 100 MG tablet Take 50 mg by mouth in the morning.     metoprolol  succinate (TOPROL -XL) 25 MG 24 hr tablet Take 25 mg by mouth daily.     risperiDONE  (RISPERDAL ) 1 MG tablet Take 1 tablet (1 mg total) by mouth at bedtime. 30 tablet 11   No facility-administered medications prior to visit.    PAST MEDICAL HISTORY: Past Medical History:  Diagnosis Date   Coronary artery disease    Dementia (HCC)    Depression    Heart disease    Hypertension    Memory loss    Seizures (HCC)    Stroke (HCC)    "years ago" (02/16/2015)    PAST SURGICAL HISTORY: Past Surgical History:  Procedure Laterality Date   CARDIAC CATHETERIZATION N/A 02/16/2015   Procedure: Left Heart Cath and Coronary Angiography;  Surgeon: Chapman Commodore, MD;  Location: [facility] INVASIVE CV LAB;  Service: Cardiovascular;   Laterality: N/A;   CARDIAC CATHETERIZATION N/A 02/16/2015   Procedure: Coronary Stent Intervention;  Surgeon: Chapman Commodore, MD;  Location: [facility] INVASIVE CV LAB;  Service: Cardiovascular;  Laterality: N/A;   CARDIAC CATHETERIZATION N/A 02/16/2015   Procedure: Intravascular Pressure Wire/FFR Study;  Surgeon: Chapman Commodore, MD;  Location: [facility] INVASIVE CV LAB;  Service: Cardiovascular;  Laterality: N/A;  CORONARY ANGIOPLASTY     LEFT HEART CATH AND CORONARY ANGIOGRAPHY N/A 03/29/2017   Procedure: LEFT HEART CATH AND CORONARY ANGIOGRAPHY;  Surgeon: Chapman Commodore, MD;  Location: [facility] INVASIVE CV LAB;  Service: Cardiovascular;  Laterality: N/A;   LEFT HEART CATHETERIZATION WITH CORONARY ANGIOGRAM N/A 03/19/2012   Procedure: LEFT HEART CATHETERIZATION WITH CORONARY ANGIOGRAM;  Surgeon: Sharene Dauer, MD;  Location: [facility] CATH LAB;  Service: Cardiovascular;  Laterality: N/A;   WISDOM TOOTH EXTRACTION      FAMILY HISTORY: Family History  Problem Relation Age of Onset   Diabetes Mother    Hypertension Mother    Stroke Father    Cancer Sister     SOCIAL HISTORY: Social History   Socioeconomic History   Marital status: Divorced    Spouse name: Not on file   Number of children: 2   Years of education: 12   Highest education level: Not on file  Occupational History    Comment: retired Naval architect  Tobacco Use   Smoking status: Former   Smokeless tobacco: Never   Tobacco comments:    quit many years ago  Substance and Sexual Activity   Alcohol use: Not Currently    Comment: 02/16/2015 "last alcohol was in ~ 2012"   Drug use: No   Sexual activity: Not Currently  Other Topics Concern   Not on file  Social History Narrative   Lives with daughter    caffeine -coffee,  1 cup daily   Social Drivers of Corporate investment banker Strain: Not on file  Food Insecurity: Not on file  Transportation Needs: Not on file  Physical Activity: Not on file  Stress: Not on file  Social Connections:  Not on file  Intimate Partner Violence: Not on file   PHYSICAL EXAM  There were no vitals filed for this visit.   There is no height or weight on file to calculate BMI.  Generalized: Well developed, in no acute distress     02/27/2019   12:44 PM 09/12/2017    1:25 PM 05/31/2017    5:00 PM  MMSE - Mini Mental State Exam  Orientation to time 0 1 1  Orientation to Place 3 4 3   Registration 3 3 3   Attention/ Calculation 0 5 3  Recall 0 0 0  Language- name 2 objects 2 2 2   Language- repeat 1 1 1   Language- follow 3 step command 3 3 3   Language- read & follow direction 1 1 1   Write a sentence 0 1 1  Copy design 0 0 0  Total score 13 21 18     Neurological examination  Mentation: Masked face, rely on son in law to provide history, oriented to his name, but not age, compliant with neurological examination, very nice cooperative, calm Cranial nerve II-XII: Pupils were equal round reactive to light. Extraocular movements were full, visual field were full on confrontational test. Facial sensation and strength were normal. Head turning and shoulder shrug  were normal and symmetric. Motor: Left more than right arm and leg rigidity, bradykinesia, there was no significant weakness Sensory: Intact to light touch Coordination: No dysmetria Reflexes: Hypoactive and symmetric Gait: He needs push-up to get up from seated position stands rather quickly, leaning forward, decreased arm swing, left worse than right, small stride  DIAGNOSTIC DATA (LABS, IMAGING, TESTING) - I reviewed patient records, labs, notes, testing and imaging myself where available.  Lab Results  Component Value Date   WBC 7.2 04/06/2023  HGB 13.7 04/06/2023   HCT 41.5 04/06/2023   MCV 90.6 04/06/2023   PLT 188 04/06/2023      Component Value Date/Time   NA 139 04/06/2023 1900   NA 143 05/09/2022 1512   K 4.3 04/06/2023 1900   CL 106 04/06/2023 1900   CO2 26 04/06/2023 1900   GLUCOSE 93 04/06/2023 1900   BUN 14  04/06/2023 1900   BUN 12 05/09/2022 1512   CREATININE 1.29 (H) 04/06/2023 1900   CALCIUM  8.7 (L) 04/06/2023 1900   PROT 6.3 (L) 04/06/2023 1900   PROT 6.9 05/09/2022 1512   ALBUMIN 3.2 (L) 04/06/2023 1900   ALBUMIN 4.5 05/09/2022 1512   AST 14 (L) 04/06/2023 1900   ALT 15 04/06/2023 1900   ALKPHOS 54 04/06/2023 1900   BILITOT 0.6 04/06/2023 1900   BILITOT 0.3 05/09/2022 1512   GFRNONAA 59 (L) 04/06/2023 1900   GFRAA 77 01/29/2020 0811   Lab Results  Component Value Date   CHOL 186 03/18/2012   HDL 72 03/18/2012   LDLCALC 105 (H) 03/18/2012   TRIG 45 02/22/2021   CHOLHDL 2.6 03/18/2012   Lab Results  Component Value Date   HGBA1C 5.6 02/16/2016   Lab Results  Component Value Date   VITAMINB12 402 02/16/2016   Lab Results  Component Value Date   TSH 1.526 04/06/2023   Cortland Ding, DNP  [facility] [address], [address] [place], [place] [address] [phone]

## 2023-07-17 ENCOUNTER — Ambulatory Visit (INDEPENDENT_AMBULATORY_CARE_PROVIDER_SITE_OTHER): Payer: Medicare HMO | Admitting: Neurology

## 2023-07-17 ENCOUNTER — Encounter: Payer: Self-pay | Admitting: Neurology

## 2023-07-17 VITALS — BP 122/70 | HR 72 | Ht 67.0 in | Wt 166.0 lb

## 2023-07-17 DIAGNOSIS — F0390 Unspecified dementia without behavioral disturbance: Secondary | ICD-10-CM

## 2023-07-17 DIAGNOSIS — G40909 Epilepsy, unspecified, not intractable, without status epilepticus: Secondary | ICD-10-CM | POA: Diagnosis not present

## 2023-07-17 DIAGNOSIS — G20C Parkinsonism, unspecified: Secondary | ICD-10-CM | POA: Diagnosis not present

## 2023-07-17 DIAGNOSIS — G20A1 Parkinson's disease without dyskinesia, without mention of fluctuations: Secondary | ICD-10-CM | POA: Diagnosis not present

## 2023-07-17 DIAGNOSIS — I10 Essential (primary) hypertension: Secondary | ICD-10-CM | POA: Diagnosis not present

## 2023-07-17 DIAGNOSIS — G4089 Other seizures: Secondary | ICD-10-CM | POA: Diagnosis not present

## 2023-07-17 DIAGNOSIS — F03B18 Unspecified dementia, moderate, with other behavioral disturbance: Secondary | ICD-10-CM | POA: Diagnosis not present

## 2023-07-17 NOTE — Patient Instructions (Signed)
 For now continue current medications, check labs today, call for any seizures, will call if need to adjust medication based on labs

## 2023-07-19 ENCOUNTER — Telehealth: Payer: Self-pay | Admitting: Neurology

## 2023-07-19 LAB — CBC WITH DIFFERENTIAL/PLATELET
Basophils Absolute: 0 10*3/uL (ref 0.0–0.2)
Basos: 1 %
EOS (ABSOLUTE): 0.1 10*3/uL (ref 0.0–0.4)
Eos: 2 %
Hematocrit: 43.4 % (ref 37.5–51.0)
Hemoglobin: 14.4 g/dL (ref 13.0–17.7)
Immature Grans (Abs): 0 10*3/uL (ref 0.0–0.1)
Immature Granulocytes: 0 %
Lymphocytes Absolute: 2.7 10*3/uL (ref 0.7–3.1)
Lymphs: 42 %
MCH: 29.6 pg (ref 26.6–33.0)
MCHC: 33.2 g/dL (ref 31.5–35.7)
MCV: 89 fL (ref 79–97)
Monocytes Absolute: 0.5 10*3/uL (ref 0.1–0.9)
Monocytes: 8 %
Neutrophils Absolute: 3.1 10*3/uL (ref 1.4–7.0)
Neutrophils: 47 %
Platelets: 221 10*3/uL (ref 150–450)
RBC: 4.87 x10E6/uL (ref 4.14–5.80)
RDW: 12.8 % (ref 11.6–15.4)
WBC: 6.4 10*3/uL (ref 3.4–10.8)

## 2023-07-19 LAB — COMPREHENSIVE METABOLIC PANEL WITH GFR
ALT: 16 IU/L (ref 0–44)
AST: 15 IU/L (ref 0–40)
Albumin: 4.5 g/dL (ref 3.8–4.8)
Alkaline Phosphatase: 122 IU/L — ABNORMAL HIGH (ref 44–121)
BUN/Creatinine Ratio: 14 (ref 10–24)
BUN: 16 mg/dL (ref 8–27)
CO2: 22 mmol/L (ref 20–29)
Calcium: 9.4 mg/dL (ref 8.6–10.2)
Chloride: 99 mmol/L (ref 96–106)
Creatinine, Ser: 1.13 mg/dL (ref 0.76–1.27)
Globulin, Total: 2.6 g/dL (ref 1.5–4.5)
Glucose: 88 mg/dL (ref 70–99)
Potassium: 4.4 mmol/L (ref 3.5–5.2)
Sodium: 143 mmol/L (ref 134–144)
Total Protein: 7.1 g/dL (ref 6.0–8.5)
eGFR: 68 mL/min/{1.73_m2} (ref >59)

## 2023-07-19 LAB — VALPROIC ACID LEVEL: Valproic Acid Lvl: 35 ug/mL — ABNORMAL LOW (ref 50–100)

## 2023-07-19 LAB — LEVETIRACETAM LEVEL: Levetiracetam Lvl: 29.1 ug/mL (ref 10.0–40.0)

## 2023-07-19 NOTE — Telephone Encounter (Signed)
 Spoke to pts daughter Gordan Latina, results given verbally and faxed to facility [facility] at [phone]

## 2023-07-19 NOTE — Telephone Encounter (Signed)
 Please call patient family member.  Labs showed normal CBC, alkaline phosphatase 122, Depakote  level 35, Keppra  level 29.1.  Reviewed with Dr. Gracie Lav, we will continue current dose of Depakote  and Keppra .  If he has recurrent seizure will increase Depakote  to 3 tablets at bedtime.  Level is a little low this time, please ensure facility is administering medication as prescribed. Please fax labs to facility. Thanks

## 2023-07-23 DIAGNOSIS — F03B18 Unspecified dementia, moderate, with other behavioral disturbance: Secondary | ICD-10-CM | POA: Diagnosis not present

## 2023-07-23 DIAGNOSIS — Z043 Encounter for examination and observation following other accident: Secondary | ICD-10-CM | POA: Diagnosis not present

## 2023-07-23 DIAGNOSIS — G20A1 Parkinson's disease without dyskinesia, without mention of fluctuations: Secondary | ICD-10-CM | POA: Diagnosis not present

## 2023-07-23 DIAGNOSIS — W010XXA Fall on same level from slipping, tripping and stumbling without subsequent striking against object, initial encounter: Secondary | ICD-10-CM | POA: Diagnosis not present

## 2023-07-23 NOTE — Telephone Encounter (Signed)
 Refaxed to correct fax # [phone]

## 2023-07-23 NOTE — Telephone Encounter (Signed)
 Tyr[facility] facility have  not received the orders for seizure medication level is very low. Contact info [phone] Fax: [phone]

## 2023-08-15 DIAGNOSIS — I10 Essential (primary) hypertension: Secondary | ICD-10-CM | POA: Diagnosis not present

## 2023-08-15 DIAGNOSIS — Z8673 Personal history of transient ischemic attack (TIA), and cerebral infarction without residual deficits: Secondary | ICD-10-CM | POA: Diagnosis not present

## 2023-08-15 DIAGNOSIS — G4089 Other seizures: Secondary | ICD-10-CM | POA: Diagnosis not present

## 2023-08-15 DIAGNOSIS — G20C Parkinsonism, unspecified: Secondary | ICD-10-CM | POA: Diagnosis not present

## 2023-08-17 DIAGNOSIS — M6281 Muscle weakness (generalized): Secondary | ICD-10-CM | POA: Diagnosis not present

## 2023-08-17 DIAGNOSIS — F0390 Unspecified dementia without behavioral disturbance: Secondary | ICD-10-CM | POA: Diagnosis not present

## 2023-08-17 DIAGNOSIS — R2681 Unsteadiness on feet: Secondary | ICD-10-CM | POA: Diagnosis not present

## 2023-08-17 DIAGNOSIS — G20A1 Parkinson's disease without dyskinesia, without mention of fluctuations: Secondary | ICD-10-CM | POA: Diagnosis not present

## 2023-08-19 DIAGNOSIS — R2681 Unsteadiness on feet: Secondary | ICD-10-CM | POA: Diagnosis not present

## 2023-08-19 DIAGNOSIS — M6281 Muscle weakness (generalized): Secondary | ICD-10-CM | POA: Diagnosis not present

## 2023-08-19 DIAGNOSIS — F0390 Unspecified dementia without behavioral disturbance: Secondary | ICD-10-CM | POA: Diagnosis not present

## 2023-08-19 DIAGNOSIS — G20A1 Parkinson's disease without dyskinesia, without mention of fluctuations: Secondary | ICD-10-CM | POA: Diagnosis not present

## 2023-08-19 DIAGNOSIS — F03B18 Unspecified dementia, moderate, with other behavioral disturbance: Secondary | ICD-10-CM | POA: Diagnosis not present

## 2023-08-21 DIAGNOSIS — G20A1 Parkinson's disease without dyskinesia, without mention of fluctuations: Secondary | ICD-10-CM | POA: Diagnosis not present

## 2023-08-21 DIAGNOSIS — M6281 Muscle weakness (generalized): Secondary | ICD-10-CM | POA: Diagnosis not present

## 2023-08-21 DIAGNOSIS — F0390 Unspecified dementia without behavioral disturbance: Secondary | ICD-10-CM | POA: Diagnosis not present

## 2023-08-21 DIAGNOSIS — F03B18 Unspecified dementia, moderate, with other behavioral disturbance: Secondary | ICD-10-CM | POA: Diagnosis not present

## 2023-08-21 DIAGNOSIS — R2681 Unsteadiness on feet: Secondary | ICD-10-CM | POA: Diagnosis not present

## 2023-08-22 DIAGNOSIS — F03B18 Unspecified dementia, moderate, with other behavioral disturbance: Secondary | ICD-10-CM | POA: Diagnosis not present

## 2023-08-22 DIAGNOSIS — R2681 Unsteadiness on feet: Secondary | ICD-10-CM | POA: Diagnosis not present

## 2023-08-22 DIAGNOSIS — F0390 Unspecified dementia without behavioral disturbance: Secondary | ICD-10-CM | POA: Diagnosis not present

## 2023-08-22 DIAGNOSIS — G20A1 Parkinson's disease without dyskinesia, without mention of fluctuations: Secondary | ICD-10-CM | POA: Diagnosis not present

## 2023-08-22 DIAGNOSIS — M6281 Muscle weakness (generalized): Secondary | ICD-10-CM | POA: Diagnosis not present

## 2023-08-23 DIAGNOSIS — G20A1 Parkinson's disease without dyskinesia, without mention of fluctuations: Secondary | ICD-10-CM | POA: Diagnosis not present

## 2023-08-23 DIAGNOSIS — R2681 Unsteadiness on feet: Secondary | ICD-10-CM | POA: Diagnosis not present

## 2023-08-23 DIAGNOSIS — F0390 Unspecified dementia without behavioral disturbance: Secondary | ICD-10-CM | POA: Diagnosis not present

## 2023-08-23 DIAGNOSIS — M6281 Muscle weakness (generalized): Secondary | ICD-10-CM | POA: Diagnosis not present

## 2023-08-23 DIAGNOSIS — F03B18 Unspecified dementia, moderate, with other behavioral disturbance: Secondary | ICD-10-CM | POA: Diagnosis not present

## 2023-08-24 DIAGNOSIS — F0390 Unspecified dementia without behavioral disturbance: Secondary | ICD-10-CM | POA: Diagnosis not present

## 2023-08-24 DIAGNOSIS — R2681 Unsteadiness on feet: Secondary | ICD-10-CM | POA: Diagnosis not present

## 2023-08-24 DIAGNOSIS — M6281 Muscle weakness (generalized): Secondary | ICD-10-CM | POA: Diagnosis not present

## 2023-08-24 DIAGNOSIS — F03B18 Unspecified dementia, moderate, with other behavioral disturbance: Secondary | ICD-10-CM | POA: Diagnosis not present

## 2023-08-24 DIAGNOSIS — G20A1 Parkinson's disease without dyskinesia, without mention of fluctuations: Secondary | ICD-10-CM | POA: Diagnosis not present

## 2023-08-25 DIAGNOSIS — F0390 Unspecified dementia without behavioral disturbance: Secondary | ICD-10-CM | POA: Diagnosis not present

## 2023-08-25 DIAGNOSIS — R2681 Unsteadiness on feet: Secondary | ICD-10-CM | POA: Diagnosis not present

## 2023-08-25 DIAGNOSIS — M6281 Muscle weakness (generalized): Secondary | ICD-10-CM | POA: Diagnosis not present

## 2023-08-25 DIAGNOSIS — F03B18 Unspecified dementia, moderate, with other behavioral disturbance: Secondary | ICD-10-CM | POA: Diagnosis not present

## 2023-08-25 DIAGNOSIS — G20A1 Parkinson's disease without dyskinesia, without mention of fluctuations: Secondary | ICD-10-CM | POA: Diagnosis not present

## 2023-08-26 DIAGNOSIS — F03B18 Unspecified dementia, moderate, with other behavioral disturbance: Secondary | ICD-10-CM | POA: Diagnosis not present

## 2023-08-26 DIAGNOSIS — G20A1 Parkinson's disease without dyskinesia, without mention of fluctuations: Secondary | ICD-10-CM | POA: Diagnosis not present

## 2023-08-26 DIAGNOSIS — F0390 Unspecified dementia without behavioral disturbance: Secondary | ICD-10-CM | POA: Diagnosis not present

## 2023-08-26 DIAGNOSIS — M6281 Muscle weakness (generalized): Secondary | ICD-10-CM | POA: Diagnosis not present

## 2023-08-26 DIAGNOSIS — R2681 Unsteadiness on feet: Secondary | ICD-10-CM | POA: Diagnosis not present

## 2023-08-27 DIAGNOSIS — G20A1 Parkinson's disease without dyskinesia, without mention of fluctuations: Secondary | ICD-10-CM | POA: Diagnosis not present

## 2023-08-27 DIAGNOSIS — F0390 Unspecified dementia without behavioral disturbance: Secondary | ICD-10-CM | POA: Diagnosis not present

## 2023-08-27 DIAGNOSIS — R2681 Unsteadiness on feet: Secondary | ICD-10-CM | POA: Diagnosis not present

## 2023-08-27 DIAGNOSIS — M6281 Muscle weakness (generalized): Secondary | ICD-10-CM | POA: Diagnosis not present

## 2023-08-27 DIAGNOSIS — F03B18 Unspecified dementia, moderate, with other behavioral disturbance: Secondary | ICD-10-CM | POA: Diagnosis not present

## 2023-08-28 DIAGNOSIS — F0390 Unspecified dementia without behavioral disturbance: Secondary | ICD-10-CM | POA: Diagnosis not present

## 2023-08-28 DIAGNOSIS — F03B18 Unspecified dementia, moderate, with other behavioral disturbance: Secondary | ICD-10-CM | POA: Diagnosis not present

## 2023-08-28 DIAGNOSIS — R2681 Unsteadiness on feet: Secondary | ICD-10-CM | POA: Diagnosis not present

## 2023-08-28 DIAGNOSIS — G20A1 Parkinson's disease without dyskinesia, without mention of fluctuations: Secondary | ICD-10-CM | POA: Diagnosis not present

## 2023-08-28 DIAGNOSIS — M6281 Muscle weakness (generalized): Secondary | ICD-10-CM | POA: Diagnosis not present

## 2023-08-29 DIAGNOSIS — R2681 Unsteadiness on feet: Secondary | ICD-10-CM | POA: Diagnosis not present

## 2023-08-29 DIAGNOSIS — F0390 Unspecified dementia without behavioral disturbance: Secondary | ICD-10-CM | POA: Diagnosis not present

## 2023-08-29 DIAGNOSIS — G20A1 Parkinson's disease without dyskinesia, without mention of fluctuations: Secondary | ICD-10-CM | POA: Diagnosis not present

## 2023-08-29 DIAGNOSIS — M6281 Muscle weakness (generalized): Secondary | ICD-10-CM | POA: Diagnosis not present

## 2023-08-29 DIAGNOSIS — F03B18 Unspecified dementia, moderate, with other behavioral disturbance: Secondary | ICD-10-CM | POA: Diagnosis not present

## 2023-08-30 DIAGNOSIS — G20A1 Parkinson's disease without dyskinesia, without mention of fluctuations: Secondary | ICD-10-CM | POA: Diagnosis not present

## 2023-08-30 DIAGNOSIS — M6281 Muscle weakness (generalized): Secondary | ICD-10-CM | POA: Diagnosis not present

## 2023-08-30 DIAGNOSIS — R2681 Unsteadiness on feet: Secondary | ICD-10-CM | POA: Diagnosis not present

## 2023-08-30 DIAGNOSIS — F0390 Unspecified dementia without behavioral disturbance: Secondary | ICD-10-CM | POA: Diagnosis not present

## 2023-08-30 DIAGNOSIS — F03B18 Unspecified dementia, moderate, with other behavioral disturbance: Secondary | ICD-10-CM | POA: Diagnosis not present

## 2023-08-31 DIAGNOSIS — R2681 Unsteadiness on feet: Secondary | ICD-10-CM | POA: Diagnosis not present

## 2023-08-31 DIAGNOSIS — M6281 Muscle weakness (generalized): Secondary | ICD-10-CM | POA: Diagnosis not present

## 2023-08-31 DIAGNOSIS — F03B18 Unspecified dementia, moderate, with other behavioral disturbance: Secondary | ICD-10-CM | POA: Diagnosis not present

## 2023-08-31 DIAGNOSIS — F0390 Unspecified dementia without behavioral disturbance: Secondary | ICD-10-CM | POA: Diagnosis not present

## 2023-08-31 DIAGNOSIS — G20A1 Parkinson's disease without dyskinesia, without mention of fluctuations: Secondary | ICD-10-CM | POA: Diagnosis not present

## 2023-09-02 DIAGNOSIS — G20A1 Parkinson's disease without dyskinesia, without mention of fluctuations: Secondary | ICD-10-CM | POA: Diagnosis not present

## 2023-09-02 DIAGNOSIS — R2681 Unsteadiness on feet: Secondary | ICD-10-CM | POA: Diagnosis not present

## 2023-09-02 DIAGNOSIS — F0390 Unspecified dementia without behavioral disturbance: Secondary | ICD-10-CM | POA: Diagnosis not present

## 2023-09-02 DIAGNOSIS — M6281 Muscle weakness (generalized): Secondary | ICD-10-CM | POA: Diagnosis not present

## 2023-09-02 DIAGNOSIS — F03B18 Unspecified dementia, moderate, with other behavioral disturbance: Secondary | ICD-10-CM | POA: Diagnosis not present

## 2023-09-03 DIAGNOSIS — R2681 Unsteadiness on feet: Secondary | ICD-10-CM | POA: Diagnosis not present

## 2023-09-03 DIAGNOSIS — M6281 Muscle weakness (generalized): Secondary | ICD-10-CM | POA: Diagnosis not present

## 2023-09-03 DIAGNOSIS — F0390 Unspecified dementia without behavioral disturbance: Secondary | ICD-10-CM | POA: Diagnosis not present

## 2023-09-03 DIAGNOSIS — F03B18 Unspecified dementia, moderate, with other behavioral disturbance: Secondary | ICD-10-CM | POA: Diagnosis not present

## 2023-09-03 DIAGNOSIS — G20A1 Parkinson's disease without dyskinesia, without mention of fluctuations: Secondary | ICD-10-CM | POA: Diagnosis not present

## 2023-09-04 DIAGNOSIS — M6281 Muscle weakness (generalized): Secondary | ICD-10-CM | POA: Diagnosis not present

## 2023-09-04 DIAGNOSIS — G20A1 Parkinson's disease without dyskinesia, without mention of fluctuations: Secondary | ICD-10-CM | POA: Diagnosis not present

## 2023-09-04 DIAGNOSIS — F0390 Unspecified dementia without behavioral disturbance: Secondary | ICD-10-CM | POA: Diagnosis not present

## 2023-09-04 DIAGNOSIS — F03B18 Unspecified dementia, moderate, with other behavioral disturbance: Secondary | ICD-10-CM | POA: Diagnosis not present

## 2023-09-04 DIAGNOSIS — R2681 Unsteadiness on feet: Secondary | ICD-10-CM | POA: Diagnosis not present

## 2023-09-05 DIAGNOSIS — G20A1 Parkinson's disease without dyskinesia, without mention of fluctuations: Secondary | ICD-10-CM | POA: Diagnosis not present

## 2023-09-05 DIAGNOSIS — R2681 Unsteadiness on feet: Secondary | ICD-10-CM | POA: Diagnosis not present

## 2023-09-05 DIAGNOSIS — F0390 Unspecified dementia without behavioral disturbance: Secondary | ICD-10-CM | POA: Diagnosis not present

## 2023-09-05 DIAGNOSIS — F03B18 Unspecified dementia, moderate, with other behavioral disturbance: Secondary | ICD-10-CM | POA: Diagnosis not present

## 2023-09-05 DIAGNOSIS — M6281 Muscle weakness (generalized): Secondary | ICD-10-CM | POA: Diagnosis not present

## 2023-09-06 DIAGNOSIS — R2681 Unsteadiness on feet: Secondary | ICD-10-CM | POA: Diagnosis not present

## 2023-09-06 DIAGNOSIS — M6281 Muscle weakness (generalized): Secondary | ICD-10-CM | POA: Diagnosis not present

## 2023-09-06 DIAGNOSIS — F0390 Unspecified dementia without behavioral disturbance: Secondary | ICD-10-CM | POA: Diagnosis not present

## 2023-09-06 DIAGNOSIS — G20A1 Parkinson's disease without dyskinesia, without mention of fluctuations: Secondary | ICD-10-CM | POA: Diagnosis not present

## 2023-09-06 DIAGNOSIS — F03B18 Unspecified dementia, moderate, with other behavioral disturbance: Secondary | ICD-10-CM | POA: Diagnosis not present

## 2023-09-07 DIAGNOSIS — F03B18 Unspecified dementia, moderate, with other behavioral disturbance: Secondary | ICD-10-CM | POA: Diagnosis not present

## 2023-09-07 DIAGNOSIS — M6281 Muscle weakness (generalized): Secondary | ICD-10-CM | POA: Diagnosis not present

## 2023-09-07 DIAGNOSIS — F0390 Unspecified dementia without behavioral disturbance: Secondary | ICD-10-CM | POA: Diagnosis not present

## 2023-09-07 DIAGNOSIS — G20A1 Parkinson's disease without dyskinesia, without mention of fluctuations: Secondary | ICD-10-CM | POA: Diagnosis not present

## 2023-09-07 DIAGNOSIS — R2681 Unsteadiness on feet: Secondary | ICD-10-CM | POA: Diagnosis not present

## 2023-09-08 DIAGNOSIS — G20A1 Parkinson's disease without dyskinesia, without mention of fluctuations: Secondary | ICD-10-CM | POA: Diagnosis not present

## 2023-09-08 DIAGNOSIS — F03B18 Unspecified dementia, moderate, with other behavioral disturbance: Secondary | ICD-10-CM | POA: Diagnosis not present

## 2023-09-08 DIAGNOSIS — M6281 Muscle weakness (generalized): Secondary | ICD-10-CM | POA: Diagnosis not present

## 2023-09-08 DIAGNOSIS — F0390 Unspecified dementia without behavioral disturbance: Secondary | ICD-10-CM | POA: Diagnosis not present

## 2023-09-08 DIAGNOSIS — R2681 Unsteadiness on feet: Secondary | ICD-10-CM | POA: Diagnosis not present

## 2023-09-10 DIAGNOSIS — F0390 Unspecified dementia without behavioral disturbance: Secondary | ICD-10-CM | POA: Diagnosis not present

## 2023-09-10 DIAGNOSIS — R2681 Unsteadiness on feet: Secondary | ICD-10-CM | POA: Diagnosis not present

## 2023-09-10 DIAGNOSIS — M6281 Muscle weakness (generalized): Secondary | ICD-10-CM | POA: Diagnosis not present

## 2023-09-10 DIAGNOSIS — F03B18 Unspecified dementia, moderate, with other behavioral disturbance: Secondary | ICD-10-CM | POA: Diagnosis not present

## 2023-09-10 DIAGNOSIS — G20A1 Parkinson's disease without dyskinesia, without mention of fluctuations: Secondary | ICD-10-CM | POA: Diagnosis not present

## 2023-09-11 DIAGNOSIS — M6281 Muscle weakness (generalized): Secondary | ICD-10-CM | POA: Diagnosis not present

## 2023-09-11 DIAGNOSIS — F03B18 Unspecified dementia, moderate, with other behavioral disturbance: Secondary | ICD-10-CM | POA: Diagnosis not present

## 2023-09-11 DIAGNOSIS — G20A1 Parkinson's disease without dyskinesia, without mention of fluctuations: Secondary | ICD-10-CM | POA: Diagnosis not present

## 2023-09-11 DIAGNOSIS — F0390 Unspecified dementia without behavioral disturbance: Secondary | ICD-10-CM | POA: Diagnosis not present

## 2023-09-11 DIAGNOSIS — R2681 Unsteadiness on feet: Secondary | ICD-10-CM | POA: Diagnosis not present

## 2023-09-12 DIAGNOSIS — R2681 Unsteadiness on feet: Secondary | ICD-10-CM | POA: Diagnosis not present

## 2023-09-12 DIAGNOSIS — M6281 Muscle weakness (generalized): Secondary | ICD-10-CM | POA: Diagnosis not present

## 2023-09-12 DIAGNOSIS — G20A1 Parkinson's disease without dyskinesia, without mention of fluctuations: Secondary | ICD-10-CM | POA: Diagnosis not present

## 2023-09-12 DIAGNOSIS — F0390 Unspecified dementia without behavioral disturbance: Secondary | ICD-10-CM | POA: Diagnosis not present

## 2023-09-12 DIAGNOSIS — F03B18 Unspecified dementia, moderate, with other behavioral disturbance: Secondary | ICD-10-CM | POA: Diagnosis not present

## 2023-09-13 DIAGNOSIS — G20A1 Parkinson's disease without dyskinesia, without mention of fluctuations: Secondary | ICD-10-CM | POA: Diagnosis not present

## 2023-09-13 DIAGNOSIS — R2681 Unsteadiness on feet: Secondary | ICD-10-CM | POA: Diagnosis not present

## 2023-09-13 DIAGNOSIS — F0390 Unspecified dementia without behavioral disturbance: Secondary | ICD-10-CM | POA: Diagnosis not present

## 2023-09-13 DIAGNOSIS — F03B18 Unspecified dementia, moderate, with other behavioral disturbance: Secondary | ICD-10-CM | POA: Diagnosis not present

## 2023-09-13 DIAGNOSIS — M6281 Muscle weakness (generalized): Secondary | ICD-10-CM | POA: Diagnosis not present

## 2023-09-14 DIAGNOSIS — F0390 Unspecified dementia without behavioral disturbance: Secondary | ICD-10-CM | POA: Diagnosis not present

## 2023-09-14 DIAGNOSIS — M6281 Muscle weakness (generalized): Secondary | ICD-10-CM | POA: Diagnosis not present

## 2023-09-14 DIAGNOSIS — G20A1 Parkinson's disease without dyskinesia, without mention of fluctuations: Secondary | ICD-10-CM | POA: Diagnosis not present

## 2023-09-14 DIAGNOSIS — E785 Hyperlipidemia, unspecified: Secondary | ICD-10-CM | POA: Diagnosis not present

## 2023-09-14 DIAGNOSIS — I10 Essential (primary) hypertension: Secondary | ICD-10-CM | POA: Diagnosis not present

## 2023-09-14 DIAGNOSIS — F03B18 Unspecified dementia, moderate, with other behavioral disturbance: Secondary | ICD-10-CM | POA: Diagnosis not present

## 2023-09-14 DIAGNOSIS — R2681 Unsteadiness on feet: Secondary | ICD-10-CM | POA: Diagnosis not present

## 2023-09-16 DIAGNOSIS — F0390 Unspecified dementia without behavioral disturbance: Secondary | ICD-10-CM | POA: Diagnosis not present

## 2023-09-16 DIAGNOSIS — F03B18 Unspecified dementia, moderate, with other behavioral disturbance: Secondary | ICD-10-CM | POA: Diagnosis not present

## 2023-09-16 DIAGNOSIS — G20A1 Parkinson's disease without dyskinesia, without mention of fluctuations: Secondary | ICD-10-CM | POA: Diagnosis not present

## 2023-09-16 DIAGNOSIS — M6281 Muscle weakness (generalized): Secondary | ICD-10-CM | POA: Diagnosis not present

## 2023-09-16 DIAGNOSIS — R2681 Unsteadiness on feet: Secondary | ICD-10-CM | POA: Diagnosis not present

## 2023-09-17 DIAGNOSIS — F0390 Unspecified dementia without behavioral disturbance: Secondary | ICD-10-CM | POA: Diagnosis not present

## 2023-09-17 DIAGNOSIS — R2681 Unsteadiness on feet: Secondary | ICD-10-CM | POA: Diagnosis not present

## 2023-09-17 DIAGNOSIS — G20A1 Parkinson's disease without dyskinesia, without mention of fluctuations: Secondary | ICD-10-CM | POA: Diagnosis not present

## 2023-09-17 DIAGNOSIS — F03B18 Unspecified dementia, moderate, with other behavioral disturbance: Secondary | ICD-10-CM | POA: Diagnosis not present

## 2023-09-17 DIAGNOSIS — M6281 Muscle weakness (generalized): Secondary | ICD-10-CM | POA: Diagnosis not present

## 2023-09-18 DIAGNOSIS — G20A1 Parkinson's disease without dyskinesia, without mention of fluctuations: Secondary | ICD-10-CM | POA: Diagnosis not present

## 2023-09-18 DIAGNOSIS — R2681 Unsteadiness on feet: Secondary | ICD-10-CM | POA: Diagnosis not present

## 2023-09-18 DIAGNOSIS — F03B18 Unspecified dementia, moderate, with other behavioral disturbance: Secondary | ICD-10-CM | POA: Diagnosis not present

## 2023-09-18 DIAGNOSIS — M6281 Muscle weakness (generalized): Secondary | ICD-10-CM | POA: Diagnosis not present

## 2023-09-18 DIAGNOSIS — F0390 Unspecified dementia without behavioral disturbance: Secondary | ICD-10-CM | POA: Diagnosis not present

## 2023-09-19 DIAGNOSIS — F0390 Unspecified dementia without behavioral disturbance: Secondary | ICD-10-CM | POA: Diagnosis not present

## 2023-09-19 DIAGNOSIS — R2681 Unsteadiness on feet: Secondary | ICD-10-CM | POA: Diagnosis not present

## 2023-09-19 DIAGNOSIS — G20A1 Parkinson's disease without dyskinesia, without mention of fluctuations: Secondary | ICD-10-CM | POA: Diagnosis not present

## 2023-09-19 DIAGNOSIS — M6281 Muscle weakness (generalized): Secondary | ICD-10-CM | POA: Diagnosis not present

## 2023-09-19 DIAGNOSIS — F03B18 Unspecified dementia, moderate, with other behavioral disturbance: Secondary | ICD-10-CM | POA: Diagnosis not present

## 2023-09-20 DIAGNOSIS — R2681 Unsteadiness on feet: Secondary | ICD-10-CM | POA: Diagnosis not present

## 2023-09-20 DIAGNOSIS — M6281 Muscle weakness (generalized): Secondary | ICD-10-CM | POA: Diagnosis not present

## 2023-09-20 DIAGNOSIS — F0390 Unspecified dementia without behavioral disturbance: Secondary | ICD-10-CM | POA: Diagnosis not present

## 2023-09-20 DIAGNOSIS — G20A1 Parkinson's disease without dyskinesia, without mention of fluctuations: Secondary | ICD-10-CM | POA: Diagnosis not present

## 2023-09-20 DIAGNOSIS — F03B18 Unspecified dementia, moderate, with other behavioral disturbance: Secondary | ICD-10-CM | POA: Diagnosis not present

## 2023-09-21 DIAGNOSIS — F03B18 Unspecified dementia, moderate, with other behavioral disturbance: Secondary | ICD-10-CM | POA: Diagnosis not present

## 2023-09-21 DIAGNOSIS — F0390 Unspecified dementia without behavioral disturbance: Secondary | ICD-10-CM | POA: Diagnosis not present

## 2023-09-21 DIAGNOSIS — G20A1 Parkinson's disease without dyskinesia, without mention of fluctuations: Secondary | ICD-10-CM | POA: Diagnosis not present

## 2023-09-21 DIAGNOSIS — R2681 Unsteadiness on feet: Secondary | ICD-10-CM | POA: Diagnosis not present

## 2023-09-21 DIAGNOSIS — M6281 Muscle weakness (generalized): Secondary | ICD-10-CM | POA: Diagnosis not present

## 2023-09-22 DIAGNOSIS — M6281 Muscle weakness (generalized): Secondary | ICD-10-CM | POA: Diagnosis not present

## 2023-09-22 DIAGNOSIS — G20A1 Parkinson's disease without dyskinesia, without mention of fluctuations: Secondary | ICD-10-CM | POA: Diagnosis not present

## 2023-09-22 DIAGNOSIS — R2681 Unsteadiness on feet: Secondary | ICD-10-CM | POA: Diagnosis not present

## 2023-09-22 DIAGNOSIS — F03B18 Unspecified dementia, moderate, with other behavioral disturbance: Secondary | ICD-10-CM | POA: Diagnosis not present

## 2023-09-22 DIAGNOSIS — F0390 Unspecified dementia without behavioral disturbance: Secondary | ICD-10-CM | POA: Diagnosis not present

## 2023-09-24 DIAGNOSIS — F03B18 Unspecified dementia, moderate, with other behavioral disturbance: Secondary | ICD-10-CM | POA: Diagnosis not present

## 2023-09-24 DIAGNOSIS — M6281 Muscle weakness (generalized): Secondary | ICD-10-CM | POA: Diagnosis not present

## 2023-09-24 DIAGNOSIS — G20A1 Parkinson's disease without dyskinesia, without mention of fluctuations: Secondary | ICD-10-CM | POA: Diagnosis not present

## 2023-09-24 DIAGNOSIS — F0390 Unspecified dementia without behavioral disturbance: Secondary | ICD-10-CM | POA: Diagnosis not present

## 2023-09-24 DIAGNOSIS — R2681 Unsteadiness on feet: Secondary | ICD-10-CM | POA: Diagnosis not present

## 2023-09-25 DIAGNOSIS — G20A1 Parkinson's disease without dyskinesia, without mention of fluctuations: Secondary | ICD-10-CM | POA: Diagnosis not present

## 2023-09-25 DIAGNOSIS — M6281 Muscle weakness (generalized): Secondary | ICD-10-CM | POA: Diagnosis not present

## 2023-09-25 DIAGNOSIS — F03B18 Unspecified dementia, moderate, with other behavioral disturbance: Secondary | ICD-10-CM | POA: Diagnosis not present

## 2023-09-25 DIAGNOSIS — F0390 Unspecified dementia without behavioral disturbance: Secondary | ICD-10-CM | POA: Diagnosis not present

## 2023-09-25 DIAGNOSIS — R2681 Unsteadiness on feet: Secondary | ICD-10-CM | POA: Diagnosis not present

## 2023-09-26 DIAGNOSIS — F03B18 Unspecified dementia, moderate, with other behavioral disturbance: Secondary | ICD-10-CM | POA: Diagnosis not present

## 2023-09-26 DIAGNOSIS — R2681 Unsteadiness on feet: Secondary | ICD-10-CM | POA: Diagnosis not present

## 2023-09-26 DIAGNOSIS — M6281 Muscle weakness (generalized): Secondary | ICD-10-CM | POA: Diagnosis not present

## 2023-09-26 DIAGNOSIS — F0390 Unspecified dementia without behavioral disturbance: Secondary | ICD-10-CM | POA: Diagnosis not present

## 2023-09-26 DIAGNOSIS — G20A1 Parkinson's disease without dyskinesia, without mention of fluctuations: Secondary | ICD-10-CM | POA: Diagnosis not present

## 2023-10-05 DIAGNOSIS — M2042 Other hammer toe(s) (acquired), left foot: Secondary | ICD-10-CM | POA: Diagnosis not present

## 2023-10-05 DIAGNOSIS — M79675 Pain in left toe(s): Secondary | ICD-10-CM | POA: Diagnosis not present

## 2023-10-05 DIAGNOSIS — M2041 Other hammer toe(s) (acquired), right foot: Secondary | ICD-10-CM | POA: Diagnosis not present

## 2023-10-05 DIAGNOSIS — M79674 Pain in right toe(s): Secondary | ICD-10-CM | POA: Diagnosis not present

## 2023-10-09 DIAGNOSIS — F03B18 Unspecified dementia, moderate, with other behavioral disturbance: Secondary | ICD-10-CM | POA: Diagnosis not present

## 2023-10-09 DIAGNOSIS — G20A1 Parkinson's disease without dyskinesia, without mention of fluctuations: Secondary | ICD-10-CM | POA: Diagnosis not present

## 2023-10-10 DIAGNOSIS — G4089 Other seizures: Secondary | ICD-10-CM | POA: Diagnosis not present

## 2023-10-10 DIAGNOSIS — F03918 Unspecified dementia, unspecified severity, with other behavioral disturbance: Secondary | ICD-10-CM | POA: Diagnosis not present

## 2023-10-10 DIAGNOSIS — F29 Unspecified psychosis not due to a substance or known physiological condition: Secondary | ICD-10-CM | POA: Diagnosis not present

## 2023-10-10 DIAGNOSIS — I10 Essential (primary) hypertension: Secondary | ICD-10-CM | POA: Diagnosis not present

## 2023-10-29 ENCOUNTER — Telehealth: Payer: Self-pay | Admitting: Neurology

## 2023-10-29 NOTE — Telephone Encounter (Signed)
 [facility]) called to verify patient's appointment.

## 2023-10-30 ENCOUNTER — Ambulatory Visit: Payer: Medicare HMO | Admitting: Neurology

## 2023-11-13 DIAGNOSIS — F03B18 Unspecified dementia, moderate, with other behavioral disturbance: Secondary | ICD-10-CM | POA: Diagnosis not present

## 2023-11-13 DIAGNOSIS — G4089 Other seizures: Secondary | ICD-10-CM | POA: Diagnosis not present

## 2023-11-13 DIAGNOSIS — I10 Essential (primary) hypertension: Secondary | ICD-10-CM | POA: Diagnosis not present

## 2023-11-13 DIAGNOSIS — G20A1 Parkinson's disease without dyskinesia, without mention of fluctuations: Secondary | ICD-10-CM | POA: Diagnosis not present

## 2023-11-14 DIAGNOSIS — H35033 Hypertensive retinopathy, bilateral: Secondary | ICD-10-CM | POA: Diagnosis not present

## 2023-11-14 DIAGNOSIS — H524 Presbyopia: Secondary | ICD-10-CM | POA: Diagnosis not present

## 2023-11-14 DIAGNOSIS — H2513 Age-related nuclear cataract, bilateral: Secondary | ICD-10-CM | POA: Diagnosis not present

## 2024-01-04 ENCOUNTER — Other Ambulatory Visit: Payer: Self-pay

## 2024-01-04 ENCOUNTER — Emergency Department (HOSPITAL_COMMUNITY)

## 2024-01-04 ENCOUNTER — Encounter (HOSPITAL_COMMUNITY): Payer: Self-pay | Admitting: Family Medicine

## 2024-01-04 ENCOUNTER — Inpatient Hospital Stay (HOSPITAL_COMMUNITY)
Admission: EM | Admit: 2024-01-04 | Discharge: 2024-01-08 | DRG: 101 | Disposition: A | Discharge status: Skilled Nursing Facility | Source: Skilled Nursing Facility | Attending: Internal Medicine | Admitting: Internal Medicine

## 2024-01-04 DIAGNOSIS — Z87891 Personal history of nicotine dependence: Secondary | ICD-10-CM | POA: Diagnosis not present

## 2024-01-04 DIAGNOSIS — I2510 Atherosclerotic heart disease of native coronary artery without angina pectoris: Secondary | ICD-10-CM | POA: Diagnosis present

## 2024-01-04 DIAGNOSIS — Z880 Allergy status to penicillin: Secondary | ICD-10-CM | POA: Diagnosis not present

## 2024-01-04 DIAGNOSIS — G40909 Epilepsy, unspecified, not intractable, without status epilepticus: Secondary | ICD-10-CM | POA: Diagnosis not present

## 2024-01-04 DIAGNOSIS — F0390 Unspecified dementia without behavioral disturbance: Secondary | ICD-10-CM | POA: Diagnosis not present

## 2024-01-04 DIAGNOSIS — E872 Acidosis, unspecified: Secondary | ICD-10-CM | POA: Diagnosis present

## 2024-01-04 DIAGNOSIS — N390 Urinary tract infection, site not specified: Secondary | ICD-10-CM | POA: Diagnosis present

## 2024-01-04 DIAGNOSIS — E039 Hypothyroidism, unspecified: Secondary | ICD-10-CM | POA: Diagnosis present

## 2024-01-04 DIAGNOSIS — R569 Unspecified convulsions: Secondary | ICD-10-CM | POA: Diagnosis not present

## 2024-01-04 DIAGNOSIS — F0280 Dementia in other diseases classified elsewhere without behavioral disturbance: Secondary | ICD-10-CM | POA: Diagnosis present

## 2024-01-04 DIAGNOSIS — K5900 Constipation, unspecified: Secondary | ICD-10-CM | POA: Diagnosis present

## 2024-01-04 DIAGNOSIS — Z8673 Personal history of transient ischemic attack (TIA), and cerebral infarction without residual deficits: Secondary | ICD-10-CM | POA: Diagnosis not present

## 2024-01-04 DIAGNOSIS — R109 Unspecified abdominal pain: Secondary | ICD-10-CM | POA: Diagnosis present

## 2024-01-04 DIAGNOSIS — Z823 Family history of stroke: Secondary | ICD-10-CM | POA: Diagnosis not present

## 2024-01-04 DIAGNOSIS — Z833 Family history of diabetes mellitus: Secondary | ICD-10-CM | POA: Diagnosis not present

## 2024-01-04 DIAGNOSIS — K9289 Other specified diseases of the digestive system: Secondary | ICD-10-CM | POA: Diagnosis not present

## 2024-01-04 DIAGNOSIS — G20C Parkinsonism, unspecified: Secondary | ICD-10-CM | POA: Diagnosis not present

## 2024-01-04 DIAGNOSIS — T68XXXA Hypothermia, initial encounter: Principal | ICD-10-CM

## 2024-01-04 DIAGNOSIS — Z888 Allergy status to other drugs, medicaments and biological substances status: Secondary | ICD-10-CM

## 2024-01-04 DIAGNOSIS — I10 Essential (primary) hypertension: Secondary | ICD-10-CM | POA: Diagnosis present

## 2024-01-04 DIAGNOSIS — E785 Hyperlipidemia, unspecified: Secondary | ICD-10-CM | POA: Diagnosis present

## 2024-01-04 DIAGNOSIS — Z79899 Other long term (current) drug therapy: Secondary | ICD-10-CM | POA: Diagnosis not present

## 2024-01-04 DIAGNOSIS — Z7982 Long term (current) use of aspirin: Secondary | ICD-10-CM | POA: Diagnosis not present

## 2024-01-04 DIAGNOSIS — G20A1 Parkinson's disease without dyskinesia, without mention of fluctuations: Secondary | ICD-10-CM | POA: Diagnosis present

## 2024-01-04 DIAGNOSIS — Z8249 Family history of ischemic heart disease and other diseases of the circulatory system: Secondary | ICD-10-CM | POA: Diagnosis not present

## 2024-01-04 DIAGNOSIS — F32A Depression, unspecified: Secondary | ICD-10-CM | POA: Diagnosis present

## 2024-01-04 LAB — CBC
HCT: 43.3 % (ref 39.0–52.0)
Hemoglobin: 14.8 g/dL (ref 13.0–17.0)
MCH: 29.1 pg (ref 26.0–34.0)
MCHC: 34.2 g/dL (ref 30.0–36.0)
MCV: 85.2 fL (ref 80.0–100.0)
Platelets: 244 K/uL (ref 150–400)
RBC: 5.08 MIL/uL (ref 4.22–5.81)
RDW: 13.1 % (ref 11.5–15.5)
WBC: 8.4 K/uL (ref 4.0–10.5)
nRBC: 0 % (ref 0.0–0.2)

## 2024-01-04 LAB — DIFFERENTIAL
Abs Immature Granulocytes: 0.05 K/uL (ref 0.00–0.07)
Basophils Absolute: 0 K/uL (ref 0.0–0.1)
Basophils Relative: 1 %
Eosinophils Absolute: 0 K/uL (ref 0.0–0.5)
Eosinophils Relative: 0 %
Immature Granulocytes: 1 %
Lymphocytes Relative: 24 %
Lymphs Abs: 2 K/uL (ref 0.7–4.0)
Monocytes Absolute: 0.8 K/uL (ref 0.1–1.0)
Monocytes Relative: 9 %
Neutro Abs: 5.5 K/uL (ref 1.7–7.7)
Neutrophils Relative %: 65 %

## 2024-01-04 LAB — I-STAT CG4 LACTIC ACID, ED
Lactic Acid, Venous: 12.9 mmol/L (ref 0.5–1.9)
Lactic Acid, Venous: 9.8 mmol/L (ref 0.5–1.9)

## 2024-01-04 LAB — RAPID URINE DRUG SCREEN, HOSP PERFORMED
Amphetamines: NOT DETECTED
Barbiturates: NOT DETECTED
Benzodiazepines: NOT DETECTED
Cocaine: NOT DETECTED
Opiates: NOT DETECTED
Tetrahydrocannabinol: NOT DETECTED

## 2024-01-04 LAB — COMPREHENSIVE METABOLIC PANEL WITH GFR
ALT: 16 U/L (ref 0–44)
AST: 33 U/L (ref 15–41)
Albumin: 4 g/dL (ref 3.5–5.0)
Alkaline Phosphatase: 82 U/L (ref 38–126)
Anion gap: 23 — ABNORMAL HIGH (ref 5–15)
BUN: 15 mg/dL (ref 8–23)
CO2: 15 mmol/L — ABNORMAL LOW (ref 22–32)
Calcium: 9.1 mg/dL (ref 8.9–10.3)
Chloride: 102 mmol/L (ref 98–111)
Creatinine, Ser: 1.34 mg/dL — ABNORMAL HIGH (ref 0.61–1.24)
GFR, Estimated: 56 mL/min — ABNORMAL LOW (ref >60)
Glucose, Bld: 176 mg/dL — ABNORMAL HIGH (ref 70–99)
Potassium: 3.7 mmol/L (ref 3.5–5.1)
Sodium: 140 mmol/L (ref 135–145)
Total Bilirubin: 1.1 mg/dL (ref 0.0–1.2)
Total Protein: 7.2 g/dL (ref 6.5–8.1)

## 2024-01-04 LAB — CBG MONITORING, ED: Glucose-Capillary: 160 mg/dL — ABNORMAL HIGH (ref 70–99)

## 2024-01-04 LAB — PROTIME-INR
INR: 1.1 (ref 0.8–1.2)
Prothrombin Time: 14.5 s (ref 11.4–15.2)

## 2024-01-04 LAB — I-STAT CHEM 8, ED
BUN: 15 mg/dL (ref 8–23)
Calcium, Ion: 0.97 mmol/L — ABNORMAL LOW (ref 1.15–1.40)
Chloride: 102 mmol/L (ref 98–111)
Creatinine, Ser: 1.1 mg/dL (ref 0.61–1.24)
Glucose, Bld: 180 mg/dL — ABNORMAL HIGH (ref 70–99)
HCT: 46 % (ref 39.0–52.0)
Hemoglobin: 15.6 g/dL (ref 13.0–17.0)
Potassium: 3.6 mmol/L (ref 3.5–5.1)
Sodium: 137 mmol/L (ref 135–145)
TCO2: 16 mmol/L — ABNORMAL LOW (ref 22–32)

## 2024-01-04 LAB — URINALYSIS, ROUTINE W REFLEX MICROSCOPIC
Bilirubin Urine: NEGATIVE
Glucose, UA: 50 mg/dL — AB
Ketones, ur: 80 mg/dL — AB
Nitrite: NEGATIVE
Protein, ur: NEGATIVE mg/dL
Specific Gravity, Urine: 1.017 (ref 1.005–1.030)
pH: 6 (ref 5.0–8.0)

## 2024-01-04 LAB — APTT: aPTT: 26 s (ref 24–36)

## 2024-01-04 LAB — ETHANOL: Alcohol, Ethyl (B): <15 mg/dL (ref <15)

## 2024-01-04 LAB — VALPROIC ACID LEVEL: Valproic Acid Lvl: 25 ug/mL — ABNORMAL LOW (ref 50–100)

## 2024-01-04 LAB — AMMONIA: Ammonia: 24 umol/L (ref 9–35)

## 2024-01-04 MED ORDER — MORPHINE SULFATE (PF) 4 MG/ML IV SOLN
4.0000 mg | Freq: Once | INTRAVENOUS | Status: AC
  Administered 2024-01-04: 4 mg via INTRAVENOUS
  Filled 2024-01-04: qty 1

## 2024-01-04 MED ORDER — VALPROATE SODIUM 100 MG/ML IV SOLN: 1000.0000 mg | Freq: Every day | INTRAVENOUS | Status: DC

## 2024-01-04 MED ORDER — VANCOMYCIN HCL IN DEXTROSE 1-5 GM/200ML-% IV SOLN
1000.0000 mg | Freq: Once | INTRAVENOUS | Status: AC
  Administered 2024-01-05: 1000 mg via INTRAVENOUS
  Filled 2024-01-04: qty 200

## 2024-01-04 MED ORDER — LACTATED RINGERS IV BOLUS (SEPSIS)
1000.0000 mL | Freq: Once | INTRAVENOUS | Status: AC
  Administered 2024-01-04: 1000 mL via INTRAVENOUS

## 2024-01-04 MED ORDER — SODIUM CHLORIDE 0.9 % IV SOLN
2.0000 g | Freq: Once | INTRAVENOUS | Status: AC
  Administered 2024-01-04: 2 g via INTRAVENOUS
  Filled 2024-01-04: qty 12.5

## 2024-01-04 MED ORDER — SODIUM CHLORIDE 0.9 % IV SOLN: 2.0000 g | Freq: Once | INTRAVENOUS | Status: DC

## 2024-01-04 MED ORDER — METRONIDAZOLE 500 MG/100ML IV SOLN
500.0000 mg | Freq: Once | INTRAVENOUS | Status: AC
  Administered 2024-01-04: 500 mg via INTRAVENOUS
  Filled 2024-01-04: qty 100

## 2024-01-04 MED ORDER — VALPROATE SODIUM 100 MG/ML IV SOLN: 500.0000 mg | Freq: Two times a day (BID) | INTRAVENOUS | Status: DC

## 2024-01-04 MED ORDER — VALPROATE SODIUM 100 MG/ML IV SOLN
500.0000 mg | Freq: Two times a day (BID) | INTRAVENOUS | Status: DC
  Administered 2024-01-04: 500 mg via INTRAVENOUS
  Filled 2024-01-04 (×3): qty 5

## 2024-01-04 MED ORDER — LEVETIRACETAM (KEPPRA) 500 MG/5 ML ADULT IV PUSH
1000.0000 mg | Freq: Two times a day (BID) | INTRAVENOUS | Status: DC
Start: 2024-01-04 — End: 2024-01-07
  Administered 2024-01-04 – 2024-01-07 (×6): 1000 mg via INTRAVENOUS
  Filled 2024-01-04 (×6): qty 10

## 2024-01-04 MED ADMIN — Iohexol IV Soln 350 MG/ML: 75 mL | INTRAVENOUS | NDC 00407141490

## 2024-01-04 NOTE — ED Triage Notes (Signed)
 Pt coming from SNF with unwitnessed 5 minute seizure. Pt post ictal, vomiting, pale, and clammy. Pt not able to answer questions at this time.

## 2024-01-04 NOTE — ED Notes (Signed)
 Called to activate code stroke

## 2024-01-04 NOTE — Code Documentation (Signed)
 Stroke Response Nurse Documentation Code Documentation  Tyrone Noble is a 73 y.o. male arriving to [facility]  via [facility] on 01/04/2024 with past medical hx of dementia, Parkinson's Disease, seizure disorder. On aspirin  81 mg daily. Code stroke was activated by ED.   Patient from SNF where he was LKW at 1200 and now complaining of altered mental status after seizure.   Stroke team at the bedside on patient arrival. Labs drawn and patient cleared for CT by Dr. Bari. Patient to CT with team. NIHSS 20, see documentation for details and code stroke times. Patient with decreased LOC, disoriented, not following commands, bilateral arm weakness, bilateral leg weakness, Global aphasia , and dysarthria  on exam. The following imaging was completed:  CT Head. Patient is not a candidate for IV Thrombolytic due to OOW, stroke not suspected. Patient is not a candidate for IR due to stroke not suspected.   Care Plan:   No acute treatment/TIA alert: q2h x 12 hours NIHSS & VS, then q4h  Bedside handoff with ED RN Edsel.    Bernis Stecher Livengood  Stroke Response RN

## 2024-01-04 NOTE — Consult Note (Addendum)
 NEUROLOGY CONSULT NOTE   Date of service: January 04, 2024 Patient Name: Tyrone Noble MRN:  [id number] DOB:  1950/07/02 Chief Complaint: code stroke  Requesting Provider: Bari Roxie HERO, DO  History of Present Illness  Tyrone Noble is a 73 y.o. male with hx of dementia, parkinson's disease, seizure disorder on keppra  and depakote , HTN, HLD, CAD, and prior stroke who presents from his facility for 5 minute unwitnessed seizure. Daughter is at the bedside and states that his seizures are usually a sort of blank stare with some confusion afterwards however his confusion has been ongoing this afternoon, which is longer than normal. His speech is garbled and is saying Please help me .   Code stroke was activated by EDP. CT head with no acute process. NIHSS 20   LKW: 1200 Modified rankin score: 4-Needs assistance to walk and tend to bodily needs IV Thrombolysis: NO acute stroke EVT:  No LVO   NIHSS components Score: Comment  1a Level of Conscious 0[]  1[x]  2[]  3[]      1b LOC Questions 0[]  1[]  2[x]       1c LOC Commands 0[]  1[]  2[x]       2 Best Gaze 0[x]  1[]  2[]       3 Visual 0[x]  1[]  2[]  3[]      4 Facial Palsy 0[x]  1[]  2[]  3[]      5a Motor Arm - left 0[]  1[]  2[x]  3[]  4[]  UN[]    5b Motor Arm - Right 0[]  1[]  2[x]  3[]  4[]  UN[]    6a Motor Leg - Left 0[]  1[]  2[]  3[x]  4[]  UN[]    6b Motor Leg - Right 0[]  1[]  2[]  3[x]  4[]  UN[]    7 Limb Ataxia 0[x]  1[]  2[]  UN[]      8 Sensory 0[x]  1[]  2[]  UN[]      9 Best Language 0[]  1[]  2[]  3[x]      10 Dysarthria 0[]  1[]  2[x]  UN[]      11 Extinct. and Inattention 0[x]  1[]  2[]       TOTAL: 20      ROS  Comprehensive ROS Unable to ascertain due to AMS  Past History   Past Medical History:  Diagnosis Date   Coronary artery disease    Dementia (HCC)    Depression    Heart disease    Hypertension    Memory loss    Seizures (HCC)    Stroke (HCC)    years ago (02/16/2015)    Past Surgical History:  Procedure Laterality Date    CARDIAC CATHETERIZATION N/A 02/16/2015   Procedure: Left Heart Cath and Coronary Angiography;  Surgeon: Rober Chroman, MD;  Location: [facility] INVASIVE CV [facility];  Service: Cardiovascular;  Laterality: N/A;   CARDIAC CATHETERIZATION N/A 02/16/2015   Procedure: Coronary Stent Intervention;  Surgeon: Rober Chroman, MD;  Location: [facility] INVASIVE CV [facility];  Service: Cardiovascular;  Laterality: N/A;   CARDIAC CATHETERIZATION N/A 02/16/2015   Procedure: Intravascular Pressure Wire/FFR Study;  Surgeon: Rober Chroman, MD;  Location: [facility] INVASIVE CV LAB;  Service: Cardiovascular;  Laterality: N/A;   CORONARY ANGIOPLASTY     LEFT HEART CATH AND CORONARY ANGIOGRAPHY N/A 03/29/2017   Procedure: LEFT HEART CATH AND CORONARY ANGIOGRAPHY;  Surgeon: Chroman Rober, MD;  Location: [facility] INVASIVE CV LAB;  Service: Cardiovascular;  Laterality: N/A;   LEFT HEART CATHETERIZATION WITH CORONARY ANGIOGRAM N/A 03/19/2012   Procedure: LEFT HEART CATHETERIZATION WITH CORONARY ANGIOGRAM;  Surgeon: Rober LOISE Chroman, MD;  Location: [facility] CATH LAB;  Service: Cardiovascular;  Laterality: N/A;   WISDOM TOOTH EXTRACTION  Family History: Family History  Problem Relation Age of Onset   Diabetes Mother    Hypertension Mother    Stroke Father    Cancer Sister     Social History  reports that he has quit smoking. He has never used smokeless tobacco. He reports that he does not currently use alcohol. He reports that he does not use drugs.  Allergies  Allergen Reactions   Aricept  [Donepezil  Hcl] Nausea Only   Penicillins Nausea And Vomiting    Has patient had a PCN reaction causing immediate rash, facial/tongue/throat swelling, SOB or lightheadedness with hypotension: No  Has patient had a PCN reaction causing severe rash involving mucus membranes or skin necrosis: No  Has patient had a PCN reaction that required hospitalization: No  Has patient had a PCN reaction occurring within the last 10 years: No  If all of the above answers are NO,  then may proceed with Cephalosporin use.    Medications  No current facility-administered medications for this encounter.  Current Outpatient Medications:    aspirin  81 MG chewable tablet, Chew 1 tablet (81 mg total) by mouth daily., Disp: 30 tablet, Rfl: 3   atorvastatin  (LIPITOR ) 20 MG tablet, Take 20 mg by mouth daily. , Disp: , Rfl: 5   carbidopa -levodopa  (SINEMET  IR) 25-250 MG tablet, TAKE 1 TABLET 4 TIMES A DAY, Disp: 360 tablet, Rfl: 4   divalproex  (DEPAKOTE  ER) 500 MG 24 hr tablet, Take 2 tablets (1,000 mg total) by mouth at bedtime., Disp: 180 tablet, Rfl: 3   levETIRAcetam  (KEPPRA ) 1000 MG tablet, Take 1 tablet (1,000 mg total) by mouth 2 (two) times daily., Disp: 180 tablet, Rfl: 1   losartan  (COZAAR ) 100 MG tablet, Take 50 mg by mouth in the morning., Disp: , Rfl:    metoprolol  succinate (TOPROL -XL) 25 MG 24 hr tablet, Take 25 mg by mouth daily., Disp: , Rfl:    risperiDONE  (RISPERDAL ) 1 MG tablet, Take 1 tablet (1 mg total) by mouth at bedtime., Disp: 30 tablet, Rfl: 11  Vitals   Vitals:   01/04/24 1644 01/04/24 1645 01/04/24 1700 01/04/24 1715  BP:  116/63 128/64 (!) 144/68  Pulse:  (!) 50    Resp:  (!) 21 13 (!) 29  SpO2:  (!) 78%    Weight: 72.6 kg     Height: 5' 4 (1.626 m)       Body mass index is 27.46 kg/m.   Physical Exam   Constitutional: Appears well-developed and well-nourished.  Psych: Affect appropriate to situation.  Eyes: No scleral injection.  HENT: No OP obstruction. Masked facies. Head: Normocephalic.  Cardiovascular: Normal rate and regular rhythm.  Respiratory: Effort normal, non-labored breathing.  GI: Soft.  No distension. There is no tenderness.  Skin: WDI.   Neurologic Examination   Mental Status -  Eyes are open, not speaking or following commands. He has said  Please help me and I am in pain   Cranial Nerves II - XII - II - Visual field intact OU . III, IV, VI - Extraocular movements intact . V - Facial sensation intact  bilaterally . VII - Facial movement intact bilaterally . VIII - Hearing & vestibular intact bilaterally: UTA X - Palate elevates symmetrically: UTA XI - Chin turning & shoulder shrug intact bilaterally: UTA XII - Tongue protrusion: UTA  Motor Strength - bilateral uppers with equal drift and bilateral lowers with withdrawal to noxious stimuli  Motor Tone - increased tone throughout (LE>UE) Sensory - responds to noxious  stimuli  Coordination - unable to assess   Gait and Station - deferred.  Labs/Imaging/Neurodiagnostic studies   CBC: No results for input(s): WBC, NEUTROABS, HGB, HCT, MCV, PLT in the last 168 hours. Basic Metabolic Panel:  Lab Results  Component Value Date   NA 143 07/17/2023   K 4.4 07/17/2023   CO2 22 07/17/2023   GLUCOSE 88 07/17/2023   BUN 16 07/17/2023   CREATININE 1.13 07/17/2023   CALCIUM  9.4 07/17/2023   GFRNONAA 59 (L) 04/06/2023   GFRAA 77 01/29/2020   Lipid Panel:  Lab Results  Component Value Date   LDLCALC 105 (H) 03/18/2012   HgbA1c:  Lab Results  Component Value Date   HGBA1C 5.6 02/16/2016   Urine Drug Screen:     Component Value Date/Time   LABOPIA NONE DETECTED 01/18/2019 1455   COCAINSCRNUR NONE DETECTED 01/18/2019 1455   COCAINSCRNUR NEGATIVE 10/16/2007 1430   LABBENZ NONE DETECTED 01/18/2019 1455   LABBENZ NEGATIVE 10/16/2007 1430   AMPHETMU NONE DETECTED 01/18/2019 1455   THCU NONE DETECTED 01/18/2019 1455   LABBARB NONE DETECTED 01/18/2019 1455    Alcohol Level     Component Value Date/Time   ETH <10 06/15/2022 0117   INR  Lab Results  Component Value Date   INR 1.1 06/15/2022   APTT  Lab Results  Component Value Date   APTT 26 06/15/2022   AED levels:  Lab Results  Component Value Date   LEVETIRACETA 29.1 07/17/2023    CT Head without contrast(Personally reviewed):  1. No acute intracranial abnormality. 2. Chronic lacunar infarct in the right centrum semiovale. 3. Chronic microvascular  ischemic changes. 4. Generalized parenchymal volume loss.  Prior EEG as per chat review: - Unable to be compliant with prolonged EEG, only able to tolerate 1 hour 6 minutes, showing intermittent right temporal focal slowing   ASSESSMENT   Tyrone Noble is a 73 y.o. male  hx of dementia, parkinson's disease, seizure disorder on keppra  and depakote , HTN, HLD, CAD, and prior stroke who presents from his memory care facility for 5 minute unwitnessed seizure. According to medication log from facility it appears he has been receiving all of his ASM's without missed doses, however chart review shows history of low ASM levels (May 2025). Therefore will assess ASM levels.  Patient's obvious discomfort and report later that he cannot breathe suggest possible provocation iso pain or infection. Although CBC without leukocytosis, may have occult infection which lowered seizure threshold.  RECOMMENDATIONS  - Seizure precautions  - Check keppra  and VPA level  - Continue home depakote  and keppra  after obtaining levels - Continue home Sinemet  if able to swallow (As per daughter, his stiffness and tremor are not severe off medication and she would prefer to hold off on NG tube) - Check ammonia level  - Evaluate for infection, constipation, and other medical causes of discomfort - As per daughter, she does not believe patient would be able to tolerate MRI ______________________________________________________________________    Signed, Karna DELENA Geralds, NP [facility]  NEUROHOSPITALIST ADDENDUM Performed a face to face diagnostic evaluation.   I have reviewed the contents of history and physical exam as documented by PA/ARNP/Resident and agree with above documentation.  I have discussed and formulated the above plan as documented. Edits to the note have been made as needed.  Jameia Makris, MD [facility]

## 2024-01-04 NOTE — ED Notes (Signed)
 Provider notified of pt rectal temp of 93.16f,

## 2024-01-04 NOTE — Progress Notes (Signed)
 Patient is not available for EEG at the moment , at imaging, EEG tech will try back later as schedule allows.

## 2024-01-04 NOTE — ED Provider Triage Note (Signed)
 Emergency Medicine Provider Triage Evaluation Note  Tyrone Noble , a 73 y.o. male  was evaluated in triage.  Brought in by EMS from a facility.  Daughter at bedside provides additional history.  Patient was apparently last seen normal at the facility at noon.  He does have a history of seizures and dementia.  Daughter states that the facility told her and EMS was notified that patient had seizure-like activity at the facility but then woke up confused and is now complaining of garbled speech and complaining that he cannot see.  Patient's daughter at bedside states she has seen him postictal many times and he has never been like this before.  Patient is repeating I can't see, I can't see.  He is having difficulty following instructions and seems confused.  He is unable to lift bilateral lower extremities up off the bed.  Due to new neurologic symptoms not consistent with prior postictal states including dysarthria and possible visual changes patient will be made a stroke code.  Review of Systems  Positive: Seizure-like activity, dysarthria, visual changes Negative: Fever/chills, recent illnesses, chest pain  Physical Exam  Ht 5' 4 (1.626 m)   Wt 72.6 kg   BMI 27.46 kg/m  Gen:   Sleepy, slow to respond Resp:  Normal effort  Cardiac:  Regular rate and rhythm Other:  Dysarthria, garbled speech, difficulty following instructions.  Does blink to threat bilaterally and appears to have intact extraocular movements.  No nystagmus.  4+ out of 5 strength in bilateral upper extremities.  2 out of 5 strength in bilateral lower extremities.  Medical Decision Making  Medically screening exam initiated at 4:58 PM.  Appropriate orders placed.  GAR GLANCE was informed that the remainder of the evaluation will be completed by another provider, this initial triage assessment does not replace that evaluation, and the importance of remaining in the ED until their evaluation is complete.  Called code  stroke.  Order set placed.  Neurology team, to evaluate at bedside.  ED provider in the pod made aware.   Franklyn Sid SAILOR, MD 01/04/24 [phone]

## 2024-01-04 NOTE — ED Provider Notes (Addendum)
 [place] EMERGENCY DEPARTMENT AT [facility] Provider Note   CSN: [id number] Arrival date & time: 01/04/24  1620     Patient presents with: Seizures   Tyrone Noble is a 73 y.o. male.   HPI   73 year old male with past medical history of seizures presents emergency department after seizure-like activity.  This was reported at the facility where he lives that.  Patient arrives by EMS, daughter at bedside.  There is concern that he is possibly postictal however the daughter states that this seems different from his typical postictal phase.  He has garbled speech and keeps saying that he cannot see.  Patient originally evaluated by previous provider and due to the new acute neurologic deficits a code stroke was called.  Prior to Admission medications   Medication Sig Start Date End Date Taking? Authorizing Provider  aspirin  81 MG chewable tablet Chew 1 tablet (81 mg total) by mouth daily. 02/17/15   Levern Hutching, MD  atorvastatin  (LIPITOR ) 20 MG tablet Take 20 mg by mouth daily.  05/08/17   [provider]  carbidopa -levodopa  (SINEMET  IR) 25-250 MG tablet TAKE 1 TABLET 4 TIMES A DAY 05/17/23   Gayland Lauraine PARAS, NP  divalproex  (DEPAKOTE  ER) 500 MG 24 hr tablet Take 2 tablets (1,000 mg total) by mouth at bedtime. 11/08/22   Gayland Lauraine PARAS, NP  levETIRAcetam  (KEPPRA ) 1000 MG tablet Take 1 tablet (1,000 mg total) by mouth 2 (two) times daily. 02/12/23   Gayland Lauraine PARAS, NP  losartan  (COZAAR ) 100 MG tablet Take 50 mg by mouth in the morning. 02/21/21   [provider]  metoprolol  succinate (TOPROL -XL) 25 MG 24 hr tablet Take 25 mg by mouth daily. 11/19/15   [provider]  risperiDONE  (RISPERDAL ) 1 MG tablet Take 1 tablet (1 mg total) by mouth at bedtime. 11/08/22   Gayland Lauraine PARAS, NP    Allergies: Aricept  [donepezil  hcl] and Penicillins    Review of Systems  Unable to perform ROS: Acuity of condition    Updated Vital Signs BP (!) 144/68   Pulse (!)  50   Resp (!) 29   Ht 5' 4 (1.626 m)   Wt 72.6 kg   SpO2 (!) 78%   BMI 27.46 kg/m   Physical Exam HENT:     Head: Normocephalic and atraumatic.     Mouth/Throat:     Mouth: Mucous membranes are dry.  Eyes:     Pupils: Pupils are equal, round, and reactive to light.     Comments: Blinking to threat  Cardiovascular:     Rate and Rhythm: Normal rate.  Pulmonary:     Effort: Pulmonary effort is normal.  Abdominal:     Palpations: Abdomen is soft.  Neurological:     Mental Status: He is alert. He is disoriented.     Comments: Garbled speech, equal weakness in the bilateral lower extremities     (all labs ordered are listed, but only abnormal results are displayed) Labs Reviewed  CBG MONITORING, ED - Abnormal; Notable for the following components:      Result Value   Glucose-Capillary 160 (*)    All other components within normal limits  PROTIME-INR  APTT  CBC  DIFFERENTIAL  COMPREHENSIVE METABOLIC PANEL WITH GFR  ETHANOL  RAPID URINE DRUG SCREEN, HOSP PERFORMED  VALPROIC ACID  LEVEL  LEVETIRACETAM  LEVEL  I-STAT CHEM 8, ED    EKG: EKG Interpretation Date/Time:  Friday January 04 2024 16:43:14 EDT Ventricular Rate:  60  PR Interval:  190 QRS Duration:  170 QT Interval:  546 QTC Calculation: 546 R Axis:      Text Interpretation: EASI Derived Leads NSR Confirmed by Bari Flank [phone]) on 01/04/2024 5:00:42 PM  Radiology: CT HEAD CODE STROKE WO CONTRAST (LKW 0-4.5h, LVO 0-24h) Result Date: 01/04/2024 EXAM: CT HEAD WITHOUT CONTRAST 01/04/2024 05:13:31 PM TECHNIQUE: CT of the head was performed without the administration of intravenous contrast. Automated exposure control, iterative reconstruction, and/or weight based adjustment of the mA/kV was utilized to reduce the radiation dose to as low as reasonably achievable. COMPARISON: CT head 04/06/2023. CLINICAL HISTORY: Neuro deficit, acute, stroke suspected. No contrast. Dr. Sallyann [phone]; Pt coming from SNF with  unwitnessed 5 minute seizure. Pt post ictal, vomiting, pale, and clammy. Pt not able to answer questions at this time. FINDINGS: BRAIN AND VENTRICLES: No acute hemorrhage. No evidence of acute infarct. No hydrocephalus. No extra-axial collection. No mass effect or midline shift. Chronic lacunar infarct in the right centrum semiovale again noted. Nonspecific hypoattenuation in the periventricular and subcortical white matter, most likely representing chronic microvascular ischemic changes. Generalized parenchymal volume loss. Atherosclerosis of the carotid siphons. ORBITS: No acute abnormality. SINUSES: Small right mastoid effusion. SOFT TISSUES AND SKULL: No acute soft tissue abnormality. No skull fracture. [place] stroke program early CT (aspect) score: Ganglionic (caudate, ic, lentiform nucleus, insula, M1-m3): 7 Supraganglionic (m4-m6): 3 Total: 10 IMPRESSION: 1. No acute intracranial abnormality. 2. Chronic lacunar infarct in the right centrum semiovale. 3. Chronic microvascular ischemic changes. 4. Generalized parenchymal volume loss. 5. Findings messaged to Dr. Sallyann at 5:26PM on 01/04/24. Electronically signed by: Donnice Mania MD 01/04/2024 05:27 PM EDT RP Workstation: [id number]     .Critical Care  Performed by: Bari Flank HERO, DO Authorized by: Bari Flank HERO, DO   Critical care provider statement:    Critical care time (minutes):  30   Critical care time was exclusive of:  Separately billable procedures and treating other patients   Critical care was necessary to treat or prevent imminent or life-threatening deterioration of the following conditions:  CNS failure or compromise, circulatory failure and sepsis   Critical care was time spent personally by me on the following activities:  Development of treatment plan with patient or surrogate, discussions with consultants, evaluation of patient's response to treatment, examination of patient, ordering and review of laboratory studies,  ordering and review of radiographic studies, ordering and performing treatments and interventions, pulse oximetry, re-evaluation of patient's condition and review of old charts   I assumed direction of critical care for this patient from another provider in my specialty: no     Care discussed with: admitting provider      Medications Ordered in the ED - No data to display                                  Medical Decision Making Amount and/or Complexity of Data Reviewed Labs: ordered. Radiology: ordered.  Risk Prescription drug management. Decision regarding hospitalization.   73 year old male presents to the emergency department after concern for seizure-like activity.  Daughter is accompanying the patient.  At this time he appears to be post ictal but is also having garbled speech and loss of vision.  For this reason a code stroke was placed by the previous provider, stroke neurology team evaluated.  Neurology feels the symptoms are all related to seizure-like activity and drug levels have been drawn.  However in this process patient was noted to be hypothermic by rectal temperature.  Septic evaluation initiated.  Lactic acid was greater than 12.  He has no leukocytosis, Bair hugger was placed.  Patient is constantly grimacing and his abdomen appears tender.  X-ray shows findings of possible enteritis as well as constipation.  CT of the abdomen pelvis shows constipation but no other acute findings.  After fluid hydration lactic acid is downtrending.  Patient was treated empirically with antibiotics for possible sepsis.  After pain medicine patient is clinically improved and temperature is improving.  Patient has an unclear picture at this time.  He will be admitted to medicine for monitoring of temperature, lactic acid.  Neurology has ordered EEG and will continue to monitor from a seizure standpoint.  Vitals are stable and patient will be admitted.  Patients evaluation and results  requires admission for further treatment and care.  Spoke with hospitalist, reviewed patient's ED course and they accept admission.  Patient agrees with admission plan, offers no new complaints and is stable/unchanged at time of admit.     Final diagnoses:  None    ED Discharge Orders     None          Bari Roxie HERO, DO 01/04/24 2358    Bari Roxie HERO, DO 01/04/24 2358

## 2024-01-04 NOTE — ED Notes (Signed)
 Attempted to stick patient for cultures twice, unsuccessful. RN and Phleb notified.

## 2024-01-05 ENCOUNTER — Inpatient Hospital Stay (HOSPITAL_COMMUNITY)

## 2024-01-05 DIAGNOSIS — I10 Essential (primary) hypertension: Secondary | ICD-10-CM | POA: Diagnosis not present

## 2024-01-05 DIAGNOSIS — G40909 Epilepsy, unspecified, not intractable, without status epilepticus: Secondary | ICD-10-CM | POA: Diagnosis not present

## 2024-01-05 DIAGNOSIS — R569 Unspecified convulsions: Secondary | ICD-10-CM | POA: Diagnosis not present

## 2024-01-05 DIAGNOSIS — K9289 Other specified diseases of the digestive system: Secondary | ICD-10-CM

## 2024-01-05 DIAGNOSIS — E039 Hypothyroidism, unspecified: Secondary | ICD-10-CM | POA: Diagnosis not present

## 2024-01-05 DIAGNOSIS — F0390 Unspecified dementia without behavioral disturbance: Secondary | ICD-10-CM | POA: Diagnosis not present

## 2024-01-05 LAB — CBC
HCT: 37.5 % — ABNORMAL LOW (ref 39.0–52.0)
Hemoglobin: 12.6 g/dL — ABNORMAL LOW (ref 13.0–17.0)
MCH: 29.1 pg (ref 26.0–34.0)
MCHC: 33.6 g/dL (ref 30.0–36.0)
MCV: 86.6 fL (ref 80.0–100.0)
Platelets: 210 K/uL (ref 150–400)
RBC: 4.33 MIL/uL (ref 4.22–5.81)
RDW: 13.3 % (ref 11.5–15.5)
WBC: 8.5 K/uL (ref 4.0–10.5)
nRBC: 0 % (ref 0.0–0.2)

## 2024-01-05 LAB — PROCALCITONIN: Procalcitonin: 0.16 ng/mL

## 2024-01-05 LAB — CORTISOL: Cortisol, Plasma: 15.2 ug/dL

## 2024-01-05 LAB — MAGNESIUM: Magnesium: 1.7 mg/dL (ref 1.7–2.4)

## 2024-01-05 LAB — LACTIC ACID, PLASMA
Lactic Acid, Venous: 2.2 mmol/L (ref 0.5–1.9)
Lactic Acid, Venous: 2.5 mmol/L (ref 0.5–1.9)
Lactic Acid, Venous: 2.5 mmol/L (ref 0.5–1.9)

## 2024-01-05 LAB — BASIC METABOLIC PANEL WITH GFR
Anion gap: 8 (ref 5–15)
BUN: 13 mg/dL (ref 8–23)
CO2: 23 mmol/L (ref 22–32)
Calcium: 8.2 mg/dL — ABNORMAL LOW (ref 8.9–10.3)
Chloride: 106 mmol/L (ref 98–111)
Creatinine, Ser: 1.09 mg/dL (ref 0.61–1.24)
GFR, Estimated: >60 mL/min (ref >60)
Glucose, Bld: 115 mg/dL — ABNORMAL HIGH (ref 70–99)
Potassium: 3.9 mmol/L (ref 3.5–5.1)
Sodium: 137 mmol/L (ref 135–145)

## 2024-01-05 LAB — TSH: TSH: 2.404 u[IU]/mL (ref 0.350–4.500)

## 2024-01-05 LAB — MRSA NEXT GEN BY PCR, NASAL: MRSA by PCR Next Gen: NOT DETECTED

## 2024-01-05 MED ORDER — SENNA 8.6 MG PO TABS
1.0000 | ORAL_TABLET | Freq: Two times a day (BID) | ORAL | Status: DC
  Administered 2024-01-05 – 2024-01-08 (×7): 8.6 mg via ORAL
  Filled 2024-01-05 (×7): qty 1

## 2024-01-05 MED ORDER — TRIMETHOBENZAMIDE HCL 100 MG/ML IM SOLN: 200.0000 mg | Freq: Four times a day (QID) | INTRAMUSCULAR | Status: DC | PRN

## 2024-01-05 MED ORDER — VALPROATE SODIUM 100 MG/ML IV SOLN
750.0000 mg | Freq: Once | INTRAVENOUS | Status: AC
  Administered 2024-01-05: 750 mg via INTRAVENOUS
  Filled 2024-01-05: qty 7.5

## 2024-01-05 MED ORDER — RISPERIDONE 1 MG PO TABS
1.0000 mg | ORAL_TABLET | Freq: Every day | ORAL | Status: DC
  Administered 2024-01-05 – 2024-01-07 (×3): 1 mg via ORAL
  Filled 2024-01-05 (×4): qty 1

## 2024-01-05 MED ORDER — LACTATED RINGERS IV SOLN: INTRAVENOUS | Status: AC

## 2024-01-05 MED ORDER — CARBIDOPA-LEVODOPA 25-250 MG PO TABS
1.0000 | ORAL_TABLET | Freq: Four times a day (QID) | ORAL | Status: DC
  Administered 2024-01-05 – 2024-01-08 (×14): 1 via ORAL
  Filled 2024-01-05 (×16): qty 1

## 2024-01-05 MED ORDER — ASPIRIN 81 MG PO CHEW
81.0000 mg | CHEWABLE_TABLET | Freq: Every day | ORAL | Status: DC
  Administered 2024-01-05 – 2024-01-08 (×4): 81 mg via ORAL
  Filled 2024-01-05 (×4): qty 1

## 2024-01-05 MED ORDER — METOPROLOL SUCCINATE ER 25 MG PO TB24
25.0000 mg | ORAL_TABLET | Freq: Every day | ORAL | Status: DC
  Administered 2024-01-05 – 2024-01-08 (×4): 25 mg via ORAL
  Filled 2024-01-05 (×4): qty 1

## 2024-01-05 MED ORDER — SODIUM CHLORIDE 0.9% FLUSH
3.0000 mL | Freq: Two times a day (BID) | INTRAVENOUS | Status: DC
  Administered 2024-01-05 – 2024-01-08 (×7): 3 mL via INTRAVENOUS

## 2024-01-05 MED ORDER — LACTATED RINGERS IV SOLN: INTRAVENOUS | Status: DC

## 2024-01-05 MED ORDER — ATORVASTATIN CALCIUM 10 MG PO TABS
20.0000 mg | ORAL_TABLET | Freq: Every day | ORAL | Status: DC
  Administered 2024-01-05 – 2024-01-08 (×4): 20 mg via ORAL
  Filled 2024-01-05 (×4): qty 2

## 2024-01-05 MED ORDER — BISACODYL 10 MG RE SUPP
10.0000 mg | Freq: Every day | RECTAL | Status: DC | PRN
Start: 2024-01-05 — End: 2024-01-08
  Administered 2024-01-05: 10 mg via RECTAL
  Filled 2024-01-05: qty 1

## 2024-01-05 MED ORDER — SODIUM CHLORIDE 0.9 % IV SOLN
1.0000 g | INTRAVENOUS | Status: AC
  Administered 2024-01-05 – 2024-01-07 (×3): 1 g via INTRAVENOUS
  Filled 2024-01-05 (×3): qty 10

## 2024-01-05 MED ORDER — BISACODYL 5 MG PO TBEC: 5.0000 mg | DELAYED_RELEASE_TABLET | Freq: Every day | ORAL | Status: DC | PRN

## 2024-01-05 MED ORDER — POLYETHYLENE GLYCOL 3350 17 G PO PACK: 17.0000 g | PACK | Freq: Every day | ORAL | Status: DC | PRN

## 2024-01-05 MED ORDER — ENOXAPARIN SODIUM 40 MG/0.4ML IJ SOSY
40.0000 mg | PREFILLED_SYRINGE | Freq: Every day | INTRAMUSCULAR | Status: DC
  Administered 2024-01-05 – 2024-01-08 (×4): 40 mg via SUBCUTANEOUS
  Filled 2024-01-05 (×4): qty 0.4

## 2024-01-05 MED ORDER — POLYETHYLENE GLYCOL 3350 17 G PO PACK
17.0000 g | PACK | Freq: Two times a day (BID) | ORAL | Status: DC
  Administered 2024-01-05 – 2024-01-08 (×7): 17 g via ORAL
  Filled 2024-01-05 (×7): qty 1

## 2024-01-05 MED ORDER — VALPROATE SODIUM 100 MG/ML IV SOLN
750.0000 mg | Freq: Two times a day (BID) | INTRAVENOUS | Status: DC
  Administered 2024-01-05 – 2024-01-07 (×4): 750 mg via INTRAVENOUS
  Filled 2024-01-05 (×7): qty 7.5

## 2024-01-05 MED ORDER — ACETAMINOPHEN 650 MG RE SUPP: 650.0000 mg | Freq: Four times a day (QID) | RECTAL | Status: DC | PRN

## 2024-01-05 MED ORDER — ACETAMINOPHEN 325 MG PO TABS
650.0000 mg | ORAL_TABLET | Freq: Four times a day (QID) | ORAL | Status: DC | PRN
  Administered 2024-01-05: 650 mg via ORAL
  Filled 2024-01-05: qty 2

## 2024-01-05 NOTE — Procedures (Signed)
 Patient Name: Tyrone Noble  MRN: [id number]  Epilepsy Attending: Arlin MALVA Krebs  Referring Physician/Provider: Voncile Isles, MD  Date: 01/05/2024 Duration: 23.06 mins  Patient history: 73 y.o. male with hx of dementia, parkinson's disease, seizure disorder on keppra  and depakote , HTN, HLD, CAD, and prior stroke who presents from his facility for 5 minute unwitnessed seizure. EEG to evaluate for seizure  Level of alertness: Awake  AEDs during EEG study: LEV, VPA  Technical aspects: This EEG study was done with scalp electrodes positioned according to the 10-20 International system of electrode placement. Electrical activity was reviewed with band pass filter of 1-70Hz , sensitivity of 7 uV/mm, display speed of 86mm/sec with a 60Hz  notched filter applied as appropriate. EEG data were recorded continuously and digitally stored.  Video monitoring was available and reviewed as appropriate.  Description: EEG showed continuous generalized 5 to 7 Hz theta slowing admixed with intermittent 2-3hz  delta slowing. Hyperventilation and photic stimulation were not performed.      ABNORMALITY - Continuous slow, generalized   IMPRESSION: This study is suggestive of generalized cerebral dysfunction (encephalopathy). No seizures or epileptiform discharges were seen throughout the recording.   Hy Swiatek O Bridget [place]brooks

## 2024-01-05 NOTE — Progress Notes (Signed)
 [facility]                                                                               Kristoph Sattler, is a 73 y.o. male, DOB - 1950-12-10, FMW:[id number] Admit date - 01/04/2024    Outpatient Primary MD for the patient is [name], [name], MD  LOS - 1  days    Brief summary  Tyrone Noble is a 73 y.o. male with medical history significant for hypertension, hyperlipidemia, CAD, history of CVA, dementia, parkinsonism, and seizure disorder who presents from his memory care unit for confusion, vomiting, and suspected seizure.   Patient was noted to be confused and lethargic at his nursing facility today, similar to after he has a seizure, but lasting longer than usual.  Patient was said to be having garbled speech, was saying please help me in the ED and also made comments about difficulty seeing and difficulty breathing.   His mental status eventually improved in the emergency department and, according to his daughter at the bedside, he was essentially back to baseline.  Head CT and CT of the abdomen and pelvis were also negative for acute findings.   Patient was evaluated by neurology in the emergency department, blood cultures were collected, Bair hugger was applied, and he was treated with 1 L LR,   Assessment & Plan    Assessment and Plan:  Suspected breakthrough seizure CT of the head is negative His VPA levels are above His Depacon was increased to 750 mg twice daily and Keppra  going 1000 mg twice daily Neurology on board and appreciate recommendations    Abdominal pain probably from constipation Patient was started on senna, MiraLAX. Patient had 1 bowel movement today.   Hypertension blood pressure parameters appear to be optimal    History of dementia in the setting of Parkinson's disease Continue with Sinemet  1 tab 4 times daily.    Questionable urinary tract infection Continue with Rocephin for 3 more days to complete the  course Unfortunately urine cultures were not obtained on admission and patient already received a dose of antibiotics.  Hyperlipidemia Continue with the Lipitor  20 mg daily.   Elevated lactic acid suspect from seizures, dehydration and infection. Recommend continue IV fluids for another 24 hours.   History of CVA Continue with aspirin  and statin.   Estimated body mass index is 27.46 kg/m as calculated from the following:   Height as of this encounter: 5' 4 (1.626 m).   Weight as of this encounter: 72.6 kg.  Code Status: Full code DVT Prophylaxis:  enoxaparin  (LOVENOX ) injection 40 mg Start: 01/05/24 1000   Level of Care: Level of care: Progressive Family Communication: Updated patient's daughter at bedside Disposition Plan:     Remains inpatient appropriate: Back to [facility] memory care unit on Monday  Procedures:  None  Consultants:   Neurology  Antimicrobials:   Anti-infectives (From admission, onward)    Start     Dose/Rate Route Frequency Ordered Stop   01/05/24 1315  cefTRIAXone (ROCEPHIN) 1 g in sodium chloride  0.9 % 100 mL IVPB        1 g 200 mL/hr over 30 Minutes Intravenous Every  24 hours 01/05/24 1219 01/08/24 1314   01/04/24 2100  aztreonam (AZACTAM) 2 g in sodium chloride  0.9 % 100 mL IVPB  Status:  Discontinued        2 g 200 mL/hr over 30 Minutes Intravenous  Once 01/04/24 2045 01/04/24 2049   01/04/24 2100  metroNIDAZOLE (FLAGYL) IVPB 500 mg        500 mg 100 mL/hr over 60 Minutes Intravenous  Once 01/04/24 2045 01/05/24 0006   01/04/24 2100  vancomycin (VANCOCIN) IVPB 1000 mg/200 mL premix        1,000 mg 200 mL/hr over 60 Minutes Intravenous  Once 01/04/24 2045 01/05/24 0116   01/04/24 2100  ceFEPIme (MAXIPIME) 2 g in sodium chloride  0.9 % 100 mL IVPB        2 g 200 mL/hr over 30 Minutes Intravenous  Once 01/04/24 2049 01/04/24 2254        Medications  Scheduled Meds:  aspirin   81 mg Oral Daily   atorvastatin   20 mg Oral Daily    carbidopa -levodopa   1 tablet Oral QID   enoxaparin  (LOVENOX ) injection  40 mg Subcutaneous Daily   levETIRAcetam   1,000 mg Intravenous Q12H   metoprolol  succinate  25 mg Oral Daily   polyethylene glycol  17 g Oral BID   risperiDONE   1 mg Oral QHS   senna  1 tablet Oral BID   sodium chloride  flush  3 mL Intravenous Q12H   Continuous Infusions:  cefTRIAXone (ROCEPHIN)  IV Stopped (01/05/24 1405)   valproate sodium     PRN Meds:.acetaminophen  **OR** acetaminophen , bisacodyl, trimethobenzamide    Subjective:   Tyrone Noble was seen and examined today.  Patient reports abdominal discomfort  Objective:   Vitals:   01/05/24 0600 01/05/24 0630 01/05/24 0922 01/05/24 1140  BP: (!) 112/55 117/73 127/80 130/84  Pulse: 92 97    Resp: 15 17 20 16   Temp:   98.9 F (37.2 C) 97.6 F (36.4 C)  TempSrc:   Oral Oral  SpO2: 98% 98% 97% 100%  Weight:      Height:        Intake/Output Summary (Last 24 hours) at 01/05/2024 1528 Last data filed at 01/05/2024 1459 Gross per 24 hour  Intake 611.12 ml  Output 400 ml  Net 211.12 ml   Filed Weights   01/04/24 1644  Weight: 72.6 kg     Exam General exam: Elderly gentleman not in any kind of distress Respiratory system: Clear to auscultation. Respiratory effort normal. Cardiovascular system: S1 & S2 heard, RRR. No JVD, Gastrointestinal system: Abdomen is soft, positive bowel sounds.  Central nervous system: Alert and oriented to person only.  Extremities: no edema.  Skin: No rashes,     Data Reviewed:  I have personally reviewed following labs and imaging studies   CBC Lab Results  Component Value Date   WBC 8.5 01/05/2024   RBC 4.33 01/05/2024   HGB 12.6 (L) 01/05/2024   HCT 37.5 (L) 01/05/2024   MCV 86.6 01/05/2024   MCH 29.1 01/05/2024   PLT 210 01/05/2024   MCHC 33.6 01/05/2024   RDW 13.3 01/05/2024   LYMPHSABS 2.0 01/04/2024   MONOABS 0.8 01/04/2024   EOSABS 0.0 01/04/2024   BASOSABS 0.0 01/04/2024      Last metabolic panel Lab Results  Component Value Date   NA 137 01/05/2024   K 3.9 01/05/2024   CL 106 01/05/2024   CO2 23 01/05/2024   BUN 13 01/05/2024   CREATININE 1.09 01/05/2024  GLUCOSE 115 (H) 01/05/2024   GFRNONAA >60 01/05/2024   GFRAA 77 01/29/2020   CALCIUM  8.2 (L) 01/05/2024   PHOS 3.6 06/15/2022   PROT 7.2 01/04/2024   ALBUMIN 4.0 01/04/2024   LABGLOB 2.6 07/17/2023   AGRATIO 1.9 05/09/2022   BILITOT 1.1 01/04/2024   ALKPHOS 82 01/04/2024   AST 33 01/04/2024   ALT 16 01/04/2024   ANIONGAP 8 01/05/2024    CBG (last 3)  Recent Labs    01/04/24 1701  GLUCAP 160*      Coagulation Profile: Recent Labs  Lab 01/04/24 1702  INR 1.1     Radiology Studies: EEG adult Result Date: 01/05/2024 Shelton Arlin KIDD, MD     01/05/2024  7:08 AM Patient Name: JAIDON ELLERY MRN: [id number] Epilepsy Attending: Arlin KIDD Shelton Referring Physician/Provider: Voncile Isles, MD Date: 01/05/2024 Duration: 23.06 mins Patient history: 73 y.o. male with hx of dementia, parkinson's disease, seizure disorder on keppra  and depakote , HTN, HLD, CAD, and prior stroke who presents from his facility for 5 minute unwitnessed seizure. EEG to evaluate for seizure Level of alertness: Awake AEDs during EEG study: LEV, VPA Technical aspects: This EEG study was done with scalp electrodes positioned according to the 10-20 International system of electrode placement. Electrical activity was reviewed with band pass filter of 1-70Hz , sensitivity of 7 uV/mm, display speed of 66mm/sec with a 60Hz  notched filter applied as appropriate. EEG data were recorded continuously and digitally stored.  Video monitoring was available and reviewed as appropriate. Description: EEG showed continuous generalized 5 to 7 Hz theta slowing admixed with intermittent 2-3hz  delta slowing. Hyperventilation and photic stimulation were not performed.    ABNORMALITY - Continuous slow, generalized  IMPRESSION: This study  is suggestive of generalized cerebral dysfunction (encephalopathy). No seizures or epileptiform discharges were seen throughout the recording.  Priyanka KIDD Shelton   CT ABDOMEN PELVIS W CONTRAST Result Date: 01/04/2024 EXAM: CT ABDOMEN AND PELVIS WITH CONTRAST 01/04/2024 10:23:00 PM TECHNIQUE: CT of the abdomen and pelvis was performed with the administration of 75 mL of iohexol  (OMNIPAQUE ) 350 MG/ML injection. Multiplanar reformatted images are provided for review. Automated exposure control, iterative reconstruction, and/or weight-based adjustment of the mA/kV was utilized to reduce the radiation dose to as low as reasonably achievable. COMPARISON: CT 06/05/2018 and same day of abdominal radiograph. CLINICAL HISTORY: Abdominal pain, acute, nonlocalized. FINDINGS: LOWER CHEST: No acute abnormality. LIVER: The liver is unremarkable. GALLBLADDER AND BILE DUCTS: Gallbladder is unremarkable. No biliary ductal dilatation. SPLEEN: No acute abnormality. PANCREAS: No acute abnormality. ADRENAL GLANDS: No acute abnormality. KIDNEYS, URETERS AND BLADDER: Punctate nonobstructing stone in the lower pole of the right kidney. No stones in the ureters. No hydronephrosis. No perinephric or periureteral stranding. Urinary bladder is unremarkable. GI AND BOWEL: Stomach demonstrates no acute abnormality. Moderate stool burden in the distal colon and rectum. There is no bowel obstruction. Normal appendix. PERITONEUM AND RETROPERITONEUM: No ascites. No free air. VASCULATURE: Aorta is normal in caliber. Aortic atherosclerotic calcification. LYMPH NODES: No lymphadenopathy. REPRODUCTIVE ORGANS: No acute abnormality. BONES AND SOFT TISSUES: No acute osseous abnormality. No focal soft tissue abnormality. No abdominal wall thickening. IMPRESSION: 1. No acute abnormality in the abdomen or pelvis. 2. Punctate nonobstructing right renal calculus. 3. Moderate stool burden in the distal colon and rectum. Electronically signed by: Norman Gatlin  MD 01/04/2024 11:04 PM EDT RP Workstation: [id number]   DG Abdomen Acute W/Chest Result Date: 01/04/2024 CLINICAL DATA:  pain EXAM: DG ABDOMEN ACUTE WITH 1 VIEW CHEST COMPARISON:  04/06/2023 FINDINGS: Moderate air-fluid level within the stomach. Generalized paucity of small bowel gas in the central abdomen. Minimal gas in the colon. Moderate volume fecal loading in the rectum.No pneumoperitoneum. No organomegaly or radiopaque calculi. No focal airspace consolidation, pleural effusion, or pneumothorax. No cardiomegaly.No acute fracture or destructive lesion. IMPRESSION: 1. Generalized paucity of small bowel gas in the central abdomen, which limits evaluation for small bowel obstruction. This could reflect changes of fluid-filled small bowel from underlying enteritis. However, if small bowel obstruction remains of clinical concern, a follow-up CT of the abdomen and pelvis with IV contrast could be considered. 2. Moderate volume fecal loading in the rectum, which can be seen in constipation. 3. No acute cardiopulmonary abnormality. Electronically Signed   By: Rogelia Myers M.D.   On: 01/04/2024 19:59   CT HEAD CODE STROKE WO CONTRAST (LKW 0-4.5h, LVO 0-24h) Result Date: 01/04/2024 EXAM: CT HEAD WITHOUT CONTRAST 01/04/2024 05:13:31 PM TECHNIQUE: CT of the head was performed without the administration of intravenous contrast. Automated exposure control, iterative reconstruction, and/or weight based adjustment of the mA/kV was utilized to reduce the radiation dose to as low as reasonably achievable. COMPARISON: CT head 04/06/2023. CLINICAL HISTORY: Neuro deficit, acute, stroke suspected. No contrast. Dr. Sallyann [phone]; Pt coming from SNF with unwitnessed 5 minute seizure. Pt post ictal, vomiting, pale, and clammy. Pt not able to answer questions at this time. FINDINGS: BRAIN AND VENTRICLES: No acute hemorrhage. No evidence of acute infarct. No hydrocephalus. No extra-axial collection. No mass effect or  midline shift. Chronic lacunar infarct in the right centrum semiovale again noted. Nonspecific hypoattenuation in the periventricular and subcortical white matter, most likely representing chronic microvascular ischemic changes. Generalized parenchymal volume loss. Atherosclerosis of the carotid siphons. ORBITS: No acute abnormality. SINUSES: Small right mastoid effusion. SOFT TISSUES AND SKULL: No acute soft tissue abnormality. No skull fracture. [place] stroke program early CT (aspect) score: Ganglionic (caudate, ic, lentiform nucleus, insula, M1-m3): 7 Supraganglionic (m4-m6): 3 Total: 10 IMPRESSION: 1. No acute intracranial abnormality. 2. Chronic lacunar infarct in the right centrum semiovale. 3. Chronic microvascular ischemic changes. 4. Generalized parenchymal volume loss. 5. Findings messaged to Dr. Sallyann at 5:26PM on 01/04/24. Electronically signed by: Donnice Mania MD 01/04/2024 05:27 PM EDT RP Workstation: [id number]       Elgie Butter M.D. [facility] 01/05/2024, 3:28 PM  Available via Epic secure chat 7am-7pm After 7 pm, please refer to night coverage provider listed on amion.

## 2024-01-05 NOTE — Progress Notes (Signed)
   01/05/24 0922  Vitals  Temp 98.9 F (37.2 C)  Temp Source Oral  BP 127/80  MAP (mmHg) 93  BP Location Left Arm  BP Method Automatic  Patient Position (if appropriate) Lying  Pulse Rate Source Monitor  ECG Heart Rate 86  Resp 20  Level of Consciousness  Level of Consciousness Alert  MEWS COLOR  MEWS Score Color Green  Oxygen Therapy  SpO2 97 %  O2 Device Room Air  Pain Assessment  Pain Scale PAINAD  Pain Score 1  Pain Intervention(s) Repositioned;Environmental changes;Rest  PAINAD (Pain Assessment in Advanced Dementia)  Breathing 0  Negative Vocalization 1  Facial Expression 0  Body Language 0  Consolability 0  PAINAD Score 1  MEWS Score  MEWS Temp 0  MEWS Systolic 0  MEWS Pulse 0  MEWS RR 0  MEWS LOC 0  MEWS Score 0   Patient admitted to 4NP-12. A&O to self only. Complaining of minor headache. Currently NPO until bedside swallow test. Personal belongings (clothes) with patient. VSS and assessment completed. Unable to orient patient to unit and staff. Cardiac tele applied, call bell within reach, bed alarm on, and fall mats in place.

## 2024-01-05 NOTE — ED Notes (Signed)
 Courtsey call given to 4np

## 2024-01-05 NOTE — Progress Notes (Signed)
 EEG complete - results pending

## 2024-01-05 NOTE — Progress Notes (Signed)
 NEUROLOGY CONSULT FOLLOW UP NOTE   Date of service: January 05, 2024 Patient Name: Tyrone Noble MRN:  [id number] DOB:  [birth year]  Interval Hx/subjective   Found to have moderate stool burden.  VPA level low. As per chart review, VPA level previously low in May 2025 with mention from neurologist to ensure patient receiving medication correctly. Keppra  level pending.  This AM, reports feeling better but nauseous. Denies any pain.    Vitals   Vitals:   01/05/24 0922 01/05/24 1140 01/05/24 1934 01/05/24 2021  BP: 127/80 130/84 (!) 156/81 (!) 146/84  Pulse:   80 78  Resp: 20 16 15 17   Temp: 98.9 F (37.2 C) 97.6 F (36.4 C) 98.9 F (37.2 C) 98.7 F (37.1 C)  TempSrc: Oral Oral Oral Oral  SpO2: 97% 100% 100% 100%  Weight:      Height:         Body mass index is 27.46 kg/m.  Physical Exam   Constitutional: Appears in no acute distress Psych: Masked facies Eyes: No scleral injection.  HENT: No OP obstrucion.  Head: Normocephalic.  Cardiovascular: Normal rate and regular rhythm.  Respiratory: Effort normal, non-labored breathing.  GI: Soft.  No distension. There is no tenderness.  Skin: WDI.   Neurologic Examination   Mental status: alert, oriented to person (his own and his daughter's name). Unable to tell year or location. Speech: no dysarthria, word-finding difficulty, paraphasic errors. Cranial nerves: PERRL EOMI VF full to BTT Face sensation intact bilaterally. Face symmetric at rest and with activation. Hearing grossly intact. Motor: Normal bulk and tone. No abnormal movements All limbs at least anti-gravity. Sensory: Grossly intact to light touch throughout. Reflexes: DTR's deferred.  Coordination: Deferred Gait: deferred   Medications  Current Facility-Administered Medications:    acetaminophen  (TYLENOL ) tablet 650 mg, 650 mg, Oral, Q6H PRN, 650 mg at 01/05/24 1827 **OR** acetaminophen  (TYLENOL ) suppository 650 mg, 650 mg, Rectal, Q6H  PRN, Opyd, Timothy S, MD   aspirin  chewable tablet 81 mg, 81 mg, Oral, Daily, Opyd, Timothy S, MD, 81 mg at 01/05/24 1016   atorvastatin  (LIPITOR ) tablet 20 mg, 20 mg, Oral, Daily, Opyd, Timothy S, MD, 20 mg at 01/05/24 1016   bisacodyl (DULCOLAX) suppository 10 mg, 10 mg, Rectal, Daily PRN, Akula, Vijaya, MD, 10 mg at 01/05/24 1552   carbidopa -levodopa  (SINEMET  IR) 25-250 MG per tablet immediate release 1 tablet, 1 tablet, Oral, QID, Opyd, Timothy S, MD, 1 tablet at 01/05/24 1827   cefTRIAXone (ROCEPHIN) 1 g in sodium chloride  0.9 % 100 mL IVPB, 1 g, Intravenous, Q24H, Akula, Vijaya, MD, Stopped at 01/05/24 1405   enoxaparin  (LOVENOX ) injection 40 mg, 40 mg, Subcutaneous, Daily, Opyd, Timothy S, MD, 40 mg at 01/05/24 1018   lactated ringers  infusion, , Intravenous, Continuous, Akula, Vijaya, MD, Last Rate: 100 mL/hr at 01/05/24 1941, Infusion Verify at 01/05/24 1941   levETIRAcetam  (KEPPRA ) undiluted injection 1,000 mg, 1,000 mg, Intravenous, Q12H, Waddell Aquas A, NP, 1,000 mg at 01/05/24 1016   metoprolol  succinate (TOPROL -XL) 24 hr tablet 25 mg, 25 mg, Oral, Daily, Opyd, Timothy S, MD, 25 mg at 01/05/24 1016   polyethylene glycol (MIRALAX / GLYCOLAX) packet 17 g, 17 g, Oral, BID, Akula, Vijaya, MD, 17 g at 01/05/24 1333   risperiDONE  (RISPERDAL ) tablet 1 mg, 1 mg, Oral, QHS, Opyd, Timothy S, MD   senna (SENOKOT) tablet 8.6 mg, 1 tablet, Oral, BID, Opyd, Timothy S, MD, 8.6 mg at 01/05/24 1016   sodium chloride  flush (NS) 0.9 %  injection 3 mL, 3 mL, Intravenous, Q12H, Opyd, Timothy S, MD, 3 mL at 01/05/24 1016   trimethobenzamide (TIGAN) injection 200 mg, 200 mg, Intramuscular, Q6H PRN, Opyd, Evalene RAMAN, MD   valproate (DEPACON) 750 mg in dextrose 5 % 50 mL IVPB, 750 mg, Intravenous, Q12H, Capricia Serda M, MD  Labs and Diagnostic Imaging   CBC:  Recent Labs  Lab 01/04/24 1702 01/04/24 1749 01/05/24 0435  WBC 8.4  --  8.5  NEUTROABS 5.5  --   --   HGB 14.8 15.6 12.6*  HCT 43.3 46.0 37.5*  MCV  85.2  --  86.6  PLT 244  --  210    Basic Metabolic Panel:  Lab Results  Component Value Date   NA 137 01/05/2024   K 3.9 01/05/2024   CO2 23 01/05/2024   GLUCOSE 115 (H) 01/05/2024   BUN 13 01/05/2024   CREATININE 1.09 01/05/2024   CALCIUM  8.2 (L) 01/05/2024   GFRNONAA >60 01/05/2024   GFRAA 77 01/29/2020   Lipid Panel:  Lab Results  Component Value Date   LDLCALC 105 (H) 03/18/2012   HgbA1c:  Lab Results  Component Value Date   HGBA1C 5.6 02/16/2016   Urine Drug Screen:     Component Value Date/Time   LABOPIA NONE DETECTED 01/04/2024 2119   COCAINSCRNUR NONE DETECTED 01/04/2024 2119   COCAINSCRNUR NEGATIVE 10/16/2007 1430   LABBENZ NONE DETECTED 01/04/2024 2119   LABBENZ NEGATIVE 10/16/2007 1430   AMPHETMU NONE DETECTED 01/04/2024 2119   THCU NONE DETECTED 01/04/2024 2119   LABBARB NONE DETECTED 01/04/2024 2119    Alcohol Level     Component Value Date/Time   [facility] <15 01/04/2024 1837   INR  Lab Results  Component Value Date   INR 1.1 01/04/2024   APTT  Lab Results  Component Value Date   APTT 26 01/04/2024   AED levels:  Lab Results  Component Value Date   LEVETIRACETA 29.1 07/17/2023    CT Head without contrast(01/04/24, Personally reviewed): No acute abnormality.    Assessment   Tyrone Noble is a 73 y.o. male with hx of dementia, parkinson's disease, seizure disorder on keppra  and depakote , HTN, HLD, CAD, and prior stroke who presents from his memory care facility for 5 minute unwitnessed seizure. According to medication log from facility it appears he has been receiving all of his ASM's without missed doses, however chart review shows history of low ASM levels (May 2025) and today's VPA level was low. Pending Keppra  level. Discomfort witnessed likely secondary to stool burden.   Recommendations  - S/p VPA load, increase maintenance to 750mg  BID - Follow up on Keppra   level ______________________________________________________________________   Signed, Niza Soderholm M Lorriane Dehart, MD [facility]

## 2024-01-05 NOTE — H&P (Addendum)
 History and Physical    [name]:[id number] DOB: [birth year] DOA: 01/04/2024  PCP: Regino Slater, MD   Patient coming from: SNF   Chief Complaint: Suspected seizure, confusion, N/V  HPI: Tyrone Noble is a 73 y.o. male with medical history significant for hypertension, hyperlipidemia, CAD, history of CVA, dementia, parkinsonism, and seizure disorder who presents from his memory care unit for confusion, vomiting, and suspected seizure.  Patient was noted to be confused and lethargic at his nursing facility today, similar to after he has a seizure, but lasting longer than usual.  Patient was said to be having garbled speech, was saying please help me in the ED and also made comments about difficulty seeing and difficulty breathing.  His mental status eventually improved in the emergency department and, according to his daughter at the bedside, he was essentially back to baseline.  He denies cough, shortness of breath, headache, chest pain, abdominal pain, or dysuria.  ED Course: Upon arrival to the ED, patient is found to be hypothermic with rectal temperature of 34.3 C, labs are most notable for serum bicarbonate 15, anion gap 23, creatinine 1.34, normal WBC, normal hemoglobin, valproic acid  level 25, normal ammonia, and lactate 12.9.  There were no acute findings on chest x-ray.  Head CT and CT of the abdomen and pelvis were also negative for acute findings.  Patient was evaluated by neurology in the emergency department, blood cultures were collected, Bair hugger was applied, and he was treated with 1 L LR, vancomycin, cefepime, Flagyl, morphine , Keppra , and Depakote .   Review of Systems:  All other systems reviewed and apart from HPI, are negative.  Past Medical History:  Diagnosis Date   Coronary artery disease    Dementia (HCC)    Depression    Heart disease    Hypertension    Hypothyroidism 01/04/2024   Memory loss    Seizures (HCC)    Stroke (HCC)     years ago (02/16/2015)    Past Surgical History:  Procedure Laterality Date   CARDIAC CATHETERIZATION N/A 02/16/2015   Procedure: Left Heart Cath and Coronary Angiography;  Surgeon: Rober Chroman, MD;  Location: [facility] INVASIVE CV LAB;  Service: Cardiovascular;  Laterality: N/A;   CARDIAC CATHETERIZATION N/A 02/16/2015   Procedure: Coronary Stent Intervention;  Surgeon: Rober Chroman, MD;  Location: [facility] INVASIVE CV LAB;  Service: Cardiovascular;  Laterality: N/A;   CARDIAC CATHETERIZATION N/A 02/16/2015   Procedure: Intravascular Pressure Wire/FFR Study;  Surgeon: Rober Chroman, MD;  Location: [facility] INVASIVE CV LAB;  Service: Cardiovascular;  Laterality: N/A;   CORONARY ANGIOPLASTY     LEFT HEART CATH AND CORONARY ANGIOGRAPHY N/A 03/29/2017   Procedure: LEFT HEART CATH AND CORONARY ANGIOGRAPHY;  Surgeon: Chroman Rober, MD;  Location: [facility] INVASIVE CV LAB;  Service: Cardiovascular;  Laterality: N/A;   LEFT HEART CATHETERIZATION WITH CORONARY ANGIOGRAM N/A 03/19/2012   Procedure: LEFT HEART CATHETERIZATION WITH CORONARY ANGIOGRAM;  Surgeon: Rober LOISE Chroman, MD;  Location: [facility] CATH LAB;  Service: Cardiovascular;  Laterality: N/A;   WISDOM TOOTH EXTRACTION      Social History:   reports that he has quit smoking. He has never used smokeless tobacco. He reports that he does not currently use alcohol. He reports that he does not use drugs.  Allergies  Allergen Reactions   Aricept  [Donepezil  Hcl] Nausea Only   Penicillins Nausea And Vomiting    Has patient had a PCN reaction causing immediate rash, facial/tongue/throat swelling, SOB or lightheadedness with hypotension: No  Has patient had a PCN reaction causing severe rash involving mucus membranes or skin necrosis: No  Has patient had a PCN reaction that required hospitalization: No  Has patient had a PCN reaction occurring within the last 10 years: No  If all of the above answers are NO, then may proceed with Cephalosporin use.    Family History   Problem Relation Age of Onset   Diabetes Mother    Hypertension Mother    Stroke Father    Cancer Sister      Prior to Admission medications   Medication Sig Start Date End Date Taking? Authorizing Provider  aspirin  81 MG chewable tablet Chew 1 tablet (81 mg total) by mouth daily. 02/17/15   Levern Hutching, MD  atorvastatin  (LIPITOR ) 20 MG tablet Take 20 mg by mouth daily.  05/08/17   [provider]  carbidopa -levodopa  (SINEMET  IR) 25-250 MG tablet TAKE 1 TABLET 4 TIMES A DAY 05/17/23   Gayland Lauraine PARAS, NP  divalproex  (DEPAKOTE  ER) 500 MG 24 hr tablet Take 2 tablets (1,000 mg total) by mouth at bedtime. 11/08/22   Gayland Lauraine PARAS, NP  levETIRAcetam  (KEPPRA ) 1000 MG tablet Take 1 tablet (1,000 mg total) by mouth 2 (two) times daily. 02/12/23   Gayland Lauraine PARAS, NP  losartan  (COZAAR ) 100 MG tablet Take 50 mg by mouth in the morning. 02/21/21   [provider]  metoprolol  succinate (TOPROL -XL) 25 MG 24 hr tablet Take 25 mg by mouth daily. 11/19/15   [provider]  risperiDONE  (RISPERDAL ) 1 MG tablet Take 1 tablet (1 mg total) by mouth at bedtime. 11/08/22   Gayland Lauraine PARAS, NP    Physical Exam: Vitals:   01/04/24 2030 01/04/24 2130 01/04/24 2145 01/05/24 0000  BP: (!) 144/88 94/82 115/72 137/79  Pulse: 83 92 87 88  Resp: (!) 28 12 15 18   Temp:  (!) 95 F (35 C)  (!) 97.3 F (36.3 C)  TempSrc:  Rectal  Rectal  SpO2: 100% 100% 100% 100%  Weight:      Height:         Constitutional: NAD, no pallor or diaphoresis   Eyes: PERTLA, lids and conjunctivae normal ENMT: Mucous membranes are moist. Posterior pharynx clear of any exudate or lesions.   Neck: supple, no masses  Respiratory: no wheezing, no crackles. No accessory muscle use.  Cardiovascular: S1 & S2 heard, regular rate and rhythm. No extremity edema.  Abdomen: No tenderness, soft. Bowel sounds active.  Musculoskeletal: no clubbing / cyanosis. No joint deformity upper and lower extremities.   Skin: no  significant rashes, lesions, ulcers. Warm, dry, well-perfused. Neurologic: CN 2-12 grossly intact. Moving all extremities. Alert and oriented to person.  Psychiatric: Calm. Cooperative.    Labs and Imaging on Admission: I have personally reviewed following labs and imaging studies  CBC: Recent Labs  Lab 01/04/24 1702 01/04/24 1749  WBC 8.4  --   NEUTROABS 5.5  --   HGB 14.8 15.6  HCT 43.3 46.0  MCV 85.2  --   PLT 244  --    Basic Metabolic Panel: Recent Labs  Lab 01/04/24 1702 01/04/24 1749  NA 140 137  K 3.7 3.6  CL 102 102  CO2 15*  --   GLUCOSE 176* 180*  BUN 15 15  CREATININE 1.34* 1.10  CALCIUM  9.1  --    GFR: Estimated Creatinine Clearance: 53.8 mL/min (by C-G formula based on SCr of 1.1 mg/dL). Liver Function Tests: Recent Labs  Lab 01/04/24  1702  AST 33  ALT 16  ALKPHOS 82  BILITOT 1.1  PROT 7.2  ALBUMIN 4.0   No results for input(s): LIPASE, AMYLASE in the last 168 hours. Recent Labs  Lab 01/04/24 1837  AMMONIA 24   Coagulation Profile: Recent Labs  Lab 01/04/24 1702  INR 1.1   Cardiac Enzymes: No results for input(s): CKTOTAL, CKMB, CKMBINDEX, TROPONINI in the last 168 hours. BNP (last 3 results) No results for input(s): PROBNP in the last 8760 hours. HbA1C: No results for input(s): HGBA1C in the last 72 hours. CBG: Recent Labs  Lab 01/04/24 1701  GLUCAP 160*   Lipid Profile: No results for input(s): CHOL, HDL, LDLCALC, TRIG, CHOLHDL, LDLDIRECT in the last 72 hours. Thyroid  Function Tests: No results for input(s): TSH, T4TOTAL, FREET4, T3FREE, THYROIDAB in the last 72 hours. Anemia Panel: No results for input(s): VITAMINB12, FOLATE, FERRITIN, TIBC, IRON, RETICCTPCT in the last 72 hours. Urine analysis:    Component Value Date/Time   COLORURINE YELLOW 01/04/2024 2119   APPEARANCEUR HAZY (A) 01/04/2024 2119   LABSPEC 1.017 01/04/2024 2119   PHURINE 6.0 01/04/2024 2119    GLUCOSEU 50 (A) 01/04/2024 2119   HGBUR SMALL (A) 01/04/2024 2119   BILIRUBINUR NEGATIVE 01/04/2024 2119   KETONESUR 80 (A) 01/04/2024 2119   PROTEINUR NEGATIVE 01/04/2024 2119   UROBILINOGEN 1.0 03/17/2012 1615   NITRITE NEGATIVE 01/04/2024 2119   LEUKOCYTESUR MODERATE (A) 01/04/2024 2119   Sepsis Labs: @LABRCNTIP (procalcitonin:4,lacticidven:4) )No results found for this or any previous visit (from the past 240 hours).   Radiological Exams on Admission: CT ABDOMEN PELVIS W CONTRAST Result Date: 01/04/2024 EXAM: CT ABDOMEN AND PELVIS WITH CONTRAST 01/04/2024 10:23:00 PM TECHNIQUE: CT of the abdomen and pelvis was performed with the administration of 75 mL of iohexol  (OMNIPAQUE ) 350 MG/ML injection. Multiplanar reformatted images are provided for review. Automated exposure control, iterative reconstruction, and/or weight-based adjustment of the mA/kV was utilized to reduce the radiation dose to as low as reasonably achievable. COMPARISON: CT 06/05/2018 and same day of abdominal radiograph. CLINICAL HISTORY: Abdominal pain, acute, nonlocalized. FINDINGS: LOWER CHEST: No acute abnormality. LIVER: The liver is unremarkable. GALLBLADDER AND BILE DUCTS: Gallbladder is unremarkable. No biliary ductal dilatation. SPLEEN: No acute abnormality. PANCREAS: No acute abnormality. ADRENAL GLANDS: No acute abnormality. KIDNEYS, URETERS AND BLADDER: Punctate nonobstructing stone in the lower pole of the right kidney. No stones in the ureters. No hydronephrosis. No perinephric or periureteral stranding. Urinary bladder is unremarkable. GI AND BOWEL: Stomach demonstrates no acute abnormality. Moderate stool burden in the distal colon and rectum. There is no bowel obstruction. Normal appendix. PERITONEUM AND RETROPERITONEUM: No ascites. No free air. VASCULATURE: Aorta is normal in caliber. Aortic atherosclerotic calcification. LYMPH NODES: No lymphadenopathy. REPRODUCTIVE ORGANS: No acute abnormality. BONES AND SOFT  TISSUES: No acute osseous abnormality. No focal soft tissue abnormality. No abdominal wall thickening. IMPRESSION: 1. No acute abnormality in the abdomen or pelvis. 2. Punctate nonobstructing right renal calculus. 3. Moderate stool burden in the distal colon and rectum. Electronically signed by: Norman Gatlin MD 01/04/2024 11:04 PM EDT RP Workstation: [id number]   DG Abdomen Acute W/Chest Result Date: 01/04/2024 CLINICAL DATA:  pain EXAM: DG ABDOMEN ACUTE WITH 1 VIEW CHEST COMPARISON:  04/06/2023 FINDINGS: Moderate air-fluid level within the stomach. Generalized paucity of small bowel gas in the central abdomen. Minimal gas in the colon. Moderate volume fecal loading in the rectum.No pneumoperitoneum. No organomegaly or radiopaque calculi. No focal airspace consolidation, pleural effusion, or pneumothorax. No cardiomegaly.No acute fracture or  destructive lesion. IMPRESSION: 1. Generalized paucity of small bowel gas in the central abdomen, which limits evaluation for small bowel obstruction. This could reflect changes of fluid-filled small bowel from underlying enteritis. However, if small bowel obstruction remains of clinical concern, a follow-up CT of the abdomen and pelvis with IV contrast could be considered. 2. Moderate volume fecal loading in the rectum, which can be seen in constipation. 3. No acute cardiopulmonary abnormality. Electronically Signed   By: Rogelia Myers M.D.   On: 01/04/2024 19:59   CT HEAD CODE STROKE WO CONTRAST (LKW 0-4.5h, LVO 0-24h) Result Date: 01/04/2024 EXAM: CT HEAD WITHOUT CONTRAST 01/04/2024 05:13:31 PM TECHNIQUE: CT of the head was performed without the administration of intravenous contrast. Automated exposure control, iterative reconstruction, and/or weight based adjustment of the mA/kV was utilized to reduce the radiation dose to as low as reasonably achievable. COMPARISON: CT head 04/06/2023. CLINICAL HISTORY: Neuro deficit, acute, stroke suspected. No contrast. Dr.  Sallyann [phone]; Pt coming from SNF with unwitnessed 5 minute seizure. Pt post ictal, vomiting, pale, and clammy. Pt not able to answer questions at this time. FINDINGS: BRAIN AND VENTRICLES: No acute hemorrhage. No evidence of acute infarct. No hydrocephalus. No extra-axial collection. No mass effect or midline shift. Chronic lacunar infarct in the right centrum semiovale again noted. Nonspecific hypoattenuation in the periventricular and subcortical white matter, most likely representing chronic microvascular ischemic changes. Generalized parenchymal volume loss. Atherosclerosis of the carotid siphons. ORBITS: No acute abnormality. SINUSES: Small right mastoid effusion. SOFT TISSUES AND SKULL: No acute soft tissue abnormality. No skull fracture. [place] stroke program early CT (aspect) score: Ganglionic (caudate, ic, lentiform nucleus, insula, M1-m3): 7 Supraganglionic (m4-m6): 3 Total: 10 IMPRESSION: 1. No acute intracranial abnormality. 2. Chronic lacunar infarct in the right centrum semiovale. 3. Chronic microvascular ischemic changes. 4. Generalized parenchymal volume loss. 5. Findings messaged to Dr. Sallyann at 5:26PM on 01/04/24. Electronically signed by: Donnice Mania MD 01/04/2024 05:27 PM EDT RP Workstation: [id number]    EKG: Independently reviewed. Sinus rhythm, RBBB.   Assessment/Plan  1. Suspected seizure; seizure disorder  - No acute head CT findings; VPA level is 25; Keppra  level pending - Continue seizure precautions, continue Depakote  and Keppra , follow-up on Keppra  level and EEG   2. Lactic acidosis  - No infectious process identified, likely related to seizure and hypothermia  - Repeat lactate, follow cultures and clinical course    3. Hypothermia  - Rectal temp 34.3 C in ED  - Glucose is normal and no infectious process identified  - Possibly related to Risperdal  and/or impaired thermoregulation d/t Parkinson's or seizure  - Check TSH, cortisol, and procalcitonin, follow  cultures and clinical course, continue warming measures    4. Parkinson disease; dementia with behavioral disturbance  - Continue Sinemet  and Risperdal , use delirium precautions    5. Hx CVA - Continue ASA and Lipitor     DVT prophylaxis: Lovenox   Code Status: Full  Level of Care: Level of care: Progressive Family Communication: Daughter updated in ED  Disposition Plan:  Patient is from: SNF  Anticipated d/c is to: SNF  Anticipated d/c date is: 01/06/24  Patient currently: Pending EEG, trend in lactate, Keppra  level, stable temperature Consults called: Neurology  Admission status: Inpatient     Evalene GORMAN Sprinkles, MD [facility]  01/05/2024, 2:24 AM

## 2024-01-05 NOTE — Plan of Care (Signed)
  Problem: Education: Goal: Knowledge of General Education information will improve Description: Including pain rating scale, medication(s)/side effects and non-pharmacologic comfort measures 01/05/2024 2154 by Pearly Doffing, RN Outcome: Progressing 01/05/2024 2154 by Pearly Doffing, RN Outcome: Progressing   Problem: Health Behavior/Discharge Planning: Goal: Ability to manage health-related needs will improve 01/05/2024 2154 by Pearly Doffing, RN Outcome: Progressing 01/05/2024 2154 by Pearly Doffing, RN Outcome: Progressing   Problem: Clinical Measurements: Goal: Ability to maintain clinical measurements within normal limits will improve 01/05/2024 2154 by Pearly Doffing, RN Outcome: Progressing 01/05/2024 2154 by Pearly Doffing, RN Outcome: Progressing Goal: Will remain free from infection 01/05/2024 2154 by Pearly Doffing, RN Outcome: Progressing 01/05/2024 2154 by Pearly Doffing, RN Outcome: Progressing Goal: Diagnostic test results will improve 01/05/2024 2154 by Pearly Doffing, RN Outcome: Progressing 01/05/2024 2154 by Pearly Doffing, RN Outcome: Progressing Goal: Respiratory complications will improve 01/05/2024 2154 by Pearly Doffing, RN Outcome: Progressing 01/05/2024 2154 by Pearly Doffing, RN Outcome: Progressing Goal: Cardiovascular complication will be avoided 01/05/2024 2154 by Pearly Doffing, RN Outcome: Progressing 01/05/2024 2154 by Pearly Doffing, RN Outcome: Progressing   Problem: Activity: Goal: Risk for activity intolerance will decrease 01/05/2024 2154 by Pearly Doffing, RN Outcome: Progressing 01/05/2024 2154 by Pearly Doffing, RN Outcome: Progressing   Problem: Coping: Goal: Level of anxiety will decrease 01/05/2024 2154 by Pearly Doffing, RN Outcome: Progressing 01/05/2024 2154 by Pearly Doffing, RN Outcome: Progressing   Problem: Safety: Goal: Ability to remain free from injury will improve Outcome: Progressing

## 2024-01-06 DIAGNOSIS — F0390 Unspecified dementia without behavioral disturbance: Secondary | ICD-10-CM | POA: Diagnosis not present

## 2024-01-06 DIAGNOSIS — E039 Hypothyroidism, unspecified: Secondary | ICD-10-CM | POA: Diagnosis not present

## 2024-01-06 DIAGNOSIS — I10 Essential (primary) hypertension: Secondary | ICD-10-CM | POA: Diagnosis not present

## 2024-01-06 DIAGNOSIS — R569 Unspecified convulsions: Secondary | ICD-10-CM | POA: Diagnosis not present

## 2024-01-06 LAB — BASIC METABOLIC PANEL WITH GFR
Anion gap: 9 (ref 5–15)
BUN: 9 mg/dL (ref 8–23)
CO2: 24 mmol/L (ref 22–32)
Calcium: 8.4 mg/dL — ABNORMAL LOW (ref 8.9–10.3)
Chloride: 106 mmol/L (ref 98–111)
Creatinine, Ser: 1.01 mg/dL (ref 0.61–1.24)
GFR, Estimated: >60 mL/min (ref >60)
Glucose, Bld: 83 mg/dL (ref 70–99)
Potassium: 3.4 mmol/L — ABNORMAL LOW (ref 3.5–5.1)
Sodium: 139 mmol/L (ref 135–145)

## 2024-01-06 LAB — CBC
HCT: 39 % (ref 39.0–52.0)
Hemoglobin: 13 g/dL (ref 13.0–17.0)
MCH: 28.8 pg (ref 26.0–34.0)
MCHC: 33.3 g/dL (ref 30.0–36.0)
MCV: 86.3 fL (ref 80.0–100.0)
Platelets: 196 K/uL (ref 150–400)
RBC: 4.52 MIL/uL (ref 4.22–5.81)
RDW: 13.7 % (ref 11.5–15.5)
WBC: 7.5 K/uL (ref 4.0–10.5)
nRBC: 0 % (ref 0.0–0.2)

## 2024-01-06 MED ORDER — POTASSIUM CHLORIDE CRYS ER 20 MEQ PO TBCR
40.0000 meq | EXTENDED_RELEASE_TABLET | Freq: Two times a day (BID) | ORAL | Status: AC
  Administered 2024-01-06 (×2): 40 meq via ORAL
  Filled 2024-01-06 (×2): qty 2

## 2024-01-06 NOTE — Evaluation (Signed)
 Clinical/Bedside Swallow Evaluation Patient Details  Name: Tyrone Noble MRN: [id number] Date of Birth: 1950-06-30  Today's Date: 01/06/2024 Time: SLP Start Time (ACUTE ONLY): 1324 SLP Stop Time (ACUTE ONLY): 1333 SLP Time Calculation (min) (ACUTE ONLY): 9 min  Past Medical History:  Past Medical History:  Diagnosis Date   Coronary artery disease    Dementia (HCC)    Depression    Heart disease    Hypertension    Hypothyroidism 01/04/2024   Memory loss    Seizures (HCC)    Stroke (HCC)    years ago (02/16/2015)   Past Surgical History:  Past Surgical History:  Procedure Laterality Date   CARDIAC CATHETERIZATION N/A 02/16/2015   Procedure: Left Heart Cath and Coronary Angiography;  Surgeon: Rober Chroman, MD;  Location: [facility] INVASIVE CV LAB;  Service: Cardiovascular;  Laterality: N/A;   CARDIAC CATHETERIZATION N/A 02/16/2015   Procedure: Coronary Stent Intervention;  Surgeon: Rober Chroman, MD;  Location: [facility] INVASIVE CV LAB;  Service: Cardiovascular;  Laterality: N/A;   CARDIAC CATHETERIZATION N/A 02/16/2015   Procedure: Intravascular Pressure Wire/FFR Study;  Surgeon: Rober Chroman, MD;  Location: [facility] INVASIVE CV LAB;  Service: Cardiovascular;  Laterality: N/A;   CORONARY ANGIOPLASTY     LEFT HEART CATH AND CORONARY ANGIOGRAPHY N/A 03/29/2017   Procedure: LEFT HEART CATH AND CORONARY ANGIOGRAPHY;  Surgeon: Chroman Rober, MD;  Location: [facility] INVASIVE CV LAB;  Service: Cardiovascular;  Laterality: N/A;   LEFT HEART CATHETERIZATION WITH CORONARY ANGIOGRAM N/A 03/19/2012   Procedure: LEFT HEART CATHETERIZATION WITH CORONARY ANGIOGRAM;  Surgeon: Rober LOISE Chroman, MD;  Location: [facility] CATH LAB;  Service: Cardiovascular;  Laterality: N/A;   WISDOM TOOTH EXTRACTION     HPI:  Patient is a 73 y.o. male with PMH: HTN, HLD, CAD, CVA, dementia, Parkinsonism, seizure disorder. He presented to the hospital from his memory care unit for confusion, vomiting and suspected seizure. In ED, he was  hypothermic, CXR, CT head, CT abdomen/pelvis were all negative for acute findings.    Assessment / Plan / Recommendation  Clinical Impression  Patient presents with clinical s/s of what appears to be a cognitive-based oral dysphagia. In addition, patient is edentulous and no dentures present. SLP assessed his swallow via sips of thin liquids, puree solids and regular solids. Prolonged mastication and oral transit observed with solids but no overt s/s aspiration with solids or liquids. Swallow initiation appeared timely. SLP recommending dys 2 (minced) solids, continue with thin liquids and will follow briefly for toleration and ability to advance solids. SLP Visit Diagnosis: Dysphagia, oral phase (R13.11)    Aspiration Risk  Mild aspiration risk    Diet Recommendation Dysphagia 2 (Fine chop);Thin liquid    Liquid Administration via: Straw;Cup Medication Administration: Other (Comment) (crushed in puree) Supervision: Staff to assist with self feeding;Full supervision/cueing for compensatory strategies Compensations: Slow rate;Small sips/bites Postural Changes: Seated upright at 90 degrees    Other  Recommendations Oral Care Recommendations: Oral care BID;Staff/trained caregiver to provide oral care     Assistance Recommended at Discharge    Functional Status Assessment Patient has had a recent decline in their functional status and demonstrates the ability to make significant improvements in function in a reasonable and predictable amount of time.  Frequency and Duration min 1 x/week  1 week       Prognosis Prognosis for improved oropharyngeal function: Fair Barriers to Reach Goals: Cognitive deficits      Swallow Study   General Date of Onset: 01/06/24 HPI: Patient is  a 73 y.o. male with PMH: HTN, HLD, CAD, CVA, dementia, Parkinsonism, seizure disorder. He presented to the hospital from his memory care unit for confusion, vomiting and suspected seizure. In ED, he was hypothermic,  CXR, CT head, CT abdomen/pelvis were all negative for acute findings. Type of Study: Bedside Swallow Evaluation Previous Swallow Assessment: none found Diet Prior to this Study: Thin liquids (Level 0);Regular Temperature Spikes Noted: No Respiratory Status: Room air History of Recent Intubation: No Behavior/Cognition: Alert;Cooperative;Requires cueing Oral Cavity Assessment: Within Functional Limits Oral Care Completed by SLP: Recent completion by staff Oral Cavity - Dentition: Edentulous;Dentures, not available Self-Feeding Abilities: Needs assist;Needs set up Baseline Vocal Quality: Normal;Low vocal intensity Volitional Cough: Cognitively unable to elicit Volitional Swallow: Unable to elicit    Oral/Motor/Sensory Function Overall Oral Motor/Sensory Function: Other (comment) (no focal weakness, patient unable to follow directions for complete OME)   Ice Chips     Thin Liquid Thin Liquid: Within functional limits Presentation: Straw;Self Fed    Nectar Thick     Honey Thick     Puree Puree: Within functional limits Presentation: Spoon   Solid     Solid: Impaired Oral Phase Impairments: Impaired mastication Oral Phase Functional Implications: Prolonged oral transit;Impaired mastication      Norleen IVAR Blase, MA, CCC-SLP Speech Therapy

## 2024-01-06 NOTE — Plan of Care (Signed)
  Problem: Education: Goal: Knowledge of General Education information will improve Description: Including pain rating scale, medication(s)/side effects and non-pharmacologic comfort measures Outcome: Progressing   Problem: Health Behavior/Discharge Planning: Goal: Ability to manage health-related needs will improve Outcome: Progressing   Problem: Clinical Measurements: Goal: Ability to maintain clinical measurements within normal limits will improve Outcome: Progressing Goal: Will remain free from infection Outcome: Progressing Goal: Diagnostic test results will improve Outcome: Progressing Goal: Respiratory complications will improve Outcome: Progressing Goal: Cardiovascular complication will be avoided Outcome: Progressing   Problem: Activity: Goal: Risk for activity intolerance will decrease Outcome: Progressing   Problem: Nutrition: Goal: Adequate nutrition will be maintained Outcome: Progressing   Problem: Coping: Goal: Level of anxiety will decrease Outcome: Progressing   Problem: Elimination: Goal: Will not experience complications related to bowel motility Outcome: Progressing Goal: Will not experience complications related to urinary retention Outcome: Progressing   Problem: Pain Managment: Goal: General experience of comfort will improve and/or be controlled Outcome: Progressing   Problem: Safety: Goal: Ability to remain free from injury will improve Outcome: Progressing   Problem: Skin Integrity: Goal: Risk for impaired skin integrity will decrease Outcome: Progressing   Problem: Education: Goal: Expressions of having a comfortable level of knowledge regarding the disease process will increase Outcome: Progressing   Problem: Coping: Goal: Ability to adjust to condition or change in health will improve Outcome: Progressing Goal: Ability to identify appropriate support needs will improve Outcome: Progressing   Problem: Health Behavior/Discharge  Planning: Goal: Compliance with prescribed medication regimen will improve Outcome: Progressing

## 2024-01-06 NOTE — Progress Notes (Signed)
 [facility]                                                                               Tyrone Noble, is a 73 y.o. male, DOB - Dec 03, 1950, FMW:[id number] Admit date - 01/04/2024    Outpatient Primary MD for the patient is [name], [name], MD  LOS - 2  days    Brief summary  Tyrone Noble is a 73 y.o. male with medical history significant for hypertension, hyperlipidemia, CAD, history of CVA, dementia, parkinsonism, and seizure disorder who presents from his memory care unit for confusion, vomiting, and suspected seizure.   Patient was noted to be confused and lethargic at his nursing facility today, similar to after he has a seizure, but lasting longer than usual.  Patient was said to be having garbled speech, was saying please help me in the ED and also made comments about difficulty seeing and difficulty breathing.   His mental status eventually improved in the emergency department and, according to his daughter at the bedside, he was essentially back to baseline.  Head CT and CT of the abdomen and pelvis were also negative for acute findings.   Patient was evaluated by neurology in the emergency department, blood cultures were collected, Bair hugger was applied, and he was treated with 1 L LR,   Assessment & Plan    Assessment and Plan:  Suspected breakthrough seizure CT of the head is negative His VPA levels are above His Depacon was increased to 750 mg twice daily and Keppra  going 1000 mg twice daily Neurology on board and appreciate recommendations Therapy evaluations are pending. SLP recommending dysphagia 2 diet.    Abdominal pain probably from constipation Patient was started on senna, MiraLAX. Patient had 1 bowel movement today.   Hypertension  Well controlled.     History of dementia in the setting of Parkinson's disease Continue with Sinemet  1 tab 4 times daily.    Questionable urinary tract infection Continue with Rocephin  for 3 more days to complete the course Unfortunately urine cultures were not obtained on admission and patient already received a dose of antibiotics.  Hyperlipidemia Continue with the Lipitor  20 mg daily.   Elevated lactic acid suspect from seizures, dehydration and infection. Recommend continue IV fluids for another 24 hours.   History of CVA Continue with aspirin  and statin.   Estimated body mass index is 27.46 kg/m as calculated from the following:   Height as of this encounter: 5' 4 (1.626 m).   Weight as of this encounter: 72.6 kg.  Code Status: Full code DVT Prophylaxis:  enoxaparin  (LOVENOX ) injection 40 mg Start: 01/05/24 1000   Level of Care: Level of care: Progressive Family Communication: none at bedside.  Disposition Plan:     Remains inpatient appropriate: Back to [facility] memory care unit on Monday  Procedures:  None  Consultants:   Neurology  Antimicrobials:   Anti-infectives (From admission, onward)    Start     Dose/Rate Route Frequency Ordered Stop   01/05/24 1315  cefTRIAXone (ROCEPHIN) 1 g in sodium chloride  0.9 % 100 mL IVPB        1 g 200 mL/hr  over 30 Minutes Intravenous Every 24 hours 01/05/24 1219 01/08/24 1314   01/04/24 2100  aztreonam (AZACTAM) 2 g in sodium chloride  0.9 % 100 mL IVPB  Status:  Discontinued        2 g 200 mL/hr over 30 Minutes Intravenous  Once 01/04/24 2045 01/04/24 2049   01/04/24 2100  metroNIDAZOLE (FLAGYL) IVPB 500 mg        500 mg 100 mL/hr over 60 Minutes Intravenous  Once 01/04/24 2045 01/05/24 0006   01/04/24 2100  vancomycin (VANCOCIN) IVPB 1000 mg/200 mL premix        1,000 mg 200 mL/hr over 60 Minutes Intravenous  Once 01/04/24 2045 01/05/24 0116   01/04/24 2100  ceFEPIme (MAXIPIME) 2 g in sodium chloride  0.9 % 100 mL IVPB        2 g 200 mL/hr over 30 Minutes Intravenous  Once 01/04/24 2049 01/04/24 2254        Medications  Scheduled Meds:  aspirin   81 mg Oral Daily   atorvastatin   20 mg Oral  Daily   carbidopa -levodopa   1 tablet Oral QID   enoxaparin  (LOVENOX ) injection  40 mg Subcutaneous Daily   levETIRAcetam   1,000 mg Intravenous Q12H   metoprolol  succinate  25 mg Oral Daily   polyethylene glycol  17 g Oral BID   potassium chloride   40 mEq Oral BID   risperiDONE   1 mg Oral QHS   senna  1 tablet Oral BID   sodium chloride  flush  3 mL Intravenous Q12H   Continuous Infusions:  cefTRIAXone (ROCEPHIN)  IV 1 g (01/06/24 1302)   valproate sodium 750 mg (01/06/24 0920)   PRN Meds:.acetaminophen  **OR** acetaminophen , bisacodyl, trimethobenzamide    Subjective:   [name] was seen and examined today.  Slight headache , no orther complaints.   Objective:   Vitals:   01/06/24 0804 01/06/24 1211 01/06/24 1600 01/06/24 1629  BP: 136/87 (!) 149/72  (!) 154/83  Pulse: 67 71  74  Resp: (!) 21 16 18 17   Temp: 97.9 F (36.6 C) 98.5 F (36.9 C)  98.8 F (37.1 C)  TempSrc: Oral Oral  Oral  SpO2: 100% 100% 100% 97%  Weight:      Height:        Intake/Output Summary (Last 24 hours) at 01/06/2024 1820 Last data filed at 01/06/2024 1400 Gross per 24 hour  Intake 1321.67 ml  Output 1601 ml  Net -279.33 ml   Filed Weights   01/04/24 1644  Weight: 72.6 kg     Exam General exam: Elderly gentleman not in any kind of distress. Cardiovascular system: S1 & S2 heard, RRR.  Gastrointestinal system: Abdomen is nondistended, soft and nontender.  Central nervous system: Alert and oriented.  Extremities: no edema.  Skin: No rashes,  Psychiatry:  Mood & affect appropriate.     Data Reviewed:  I have personally reviewed following labs and imaging studies   CBC Lab Results  Component Value Date   WBC 7.5 01/06/2024   RBC 4.52 01/06/2024   HGB 13.0 01/06/2024   HCT 39.0 01/06/2024   MCV 86.3 01/06/2024   MCH 28.8 01/06/2024   PLT 196 01/06/2024   MCHC 33.3 01/06/2024   RDW 13.7 01/06/2024   LYMPHSABS 2.0 01/04/2024   MONOABS 0.8 01/04/2024   EOSABS 0.0  01/04/2024   BASOSABS 0.0 01/04/2024     Last metabolic panel Lab Results  Component Value Date   NA 139 01/06/2024   K 3.4 (L) 01/06/2024  CL 106 01/06/2024   CO2 24 01/06/2024   BUN 9 01/06/2024   CREATININE 1.01 01/06/2024   GLUCOSE 83 01/06/2024   GFRNONAA >60 01/06/2024   GFRAA 77 01/29/2020   CALCIUM  8.4 (L) 01/06/2024   PHOS 3.6 06/15/2022   PROT 7.2 01/04/2024   ALBUMIN 4.0 01/04/2024   LABGLOB 2.6 07/17/2023   AGRATIO 1.9 05/09/2022   BILITOT 1.1 01/04/2024   ALKPHOS 82 01/04/2024   AST 33 01/04/2024   ALT 16 01/04/2024   ANIONGAP 9 01/06/2024    CBG (last 3)  Recent Labs    01/04/24 1701  GLUCAP 160*      Coagulation Profile: Recent Labs  Lab 01/04/24 1702  INR 1.1     Radiology Studies: EEG adult Result Date: 01/05/2024 Shelton Arlin KIDD, MD     01/05/2024  7:08 AM Patient Name: DEMONTRE PADIN MRN: [id number] Epilepsy Attending: Arlin KIDD Shelton Referring Physician/Provider: Voncile Isles, MD Date: 01/05/2024 Duration: 23.06 mins Patient history: 73 y.o. male with hx of dementia, parkinson's disease, seizure disorder on keppra  and depakote , HTN, HLD, CAD, and prior stroke who presents from his facility for 5 minute unwitnessed seizure. EEG to evaluate for seizure Level of alertness: Awake AEDs during EEG study: LEV, VPA Technical aspects: This EEG study was done with scalp electrodes positioned according to the 10-20 International system of electrode placement. Electrical activity was reviewed with band pass filter of 1-70Hz , sensitivity of 7 uV/mm, display speed of 42mm/sec with a 60Hz  notched filter applied as appropriate. EEG data were recorded continuously and digitally stored.  Video monitoring was available and reviewed as appropriate. Description: EEG showed continuous generalized 5 to 7 Hz theta slowing admixed with intermittent 2-3hz  delta slowing. Hyperventilation and photic stimulation were not performed.    ABNORMALITY - Continuous slow,  generalized  IMPRESSION: This study is suggestive of generalized cerebral dysfunction (encephalopathy). No seizures or epileptiform discharges were seen throughout the recording.  Priyanka KIDD Shelton   CT ABDOMEN PELVIS W CONTRAST Result Date: 01/04/2024 EXAM: CT ABDOMEN AND PELVIS WITH CONTRAST 01/04/2024 10:23:00 PM TECHNIQUE: CT of the abdomen and pelvis was performed with the administration of 75 mL of iohexol  (OMNIPAQUE ) 350 MG/ML injection. Multiplanar reformatted images are provided for review. Automated exposure control, iterative reconstruction, and/or weight-based adjustment of the mA/kV was utilized to reduce the radiation dose to as low as reasonably achievable. COMPARISON: CT 06/05/2018 and same day of abdominal radiograph. CLINICAL HISTORY: Abdominal pain, acute, nonlocalized. FINDINGS: LOWER CHEST: No acute abnormality. LIVER: The liver is unremarkable. GALLBLADDER AND BILE DUCTS: Gallbladder is unremarkable. No biliary ductal dilatation. SPLEEN: No acute abnormality. PANCREAS: No acute abnormality. ADRENAL GLANDS: No acute abnormality. KIDNEYS, URETERS AND BLADDER: Punctate nonobstructing stone in the lower pole of the right kidney. No stones in the ureters. No hydronephrosis. No perinephric or periureteral stranding. Urinary bladder is unremarkable. GI AND BOWEL: Stomach demonstrates no acute abnormality. Moderate stool burden in the distal colon and rectum. There is no bowel obstruction. Normal appendix. PERITONEUM AND RETROPERITONEUM: No ascites. No free air. VASCULATURE: Aorta is normal in caliber. Aortic atherosclerotic calcification. LYMPH NODES: No lymphadenopathy. REPRODUCTIVE ORGANS: No acute abnormality. BONES AND SOFT TISSUES: No acute osseous abnormality. No focal soft tissue abnormality. No abdominal wall thickening. IMPRESSION: 1. No acute abnormality in the abdomen or pelvis. 2. Punctate nonobstructing right renal calculus. 3. Moderate stool burden in the distal colon and rectum.  Electronically signed by: Norman Gatlin MD 01/04/2024 11:04 PM EDT RP Workstation: [id number]   DG Abdomen  Acute W/Chest Result Date: 01/04/2024 CLINICAL DATA:  pain EXAM: DG ABDOMEN ACUTE WITH 1 VIEW CHEST COMPARISON:  04/06/2023 FINDINGS: Moderate air-fluid level within the stomach. Generalized paucity of small bowel gas in the central abdomen. Minimal gas in the colon. Moderate volume fecal loading in the rectum.No pneumoperitoneum. No organomegaly or radiopaque calculi. No focal airspace consolidation, pleural effusion, or pneumothorax. No cardiomegaly.No acute fracture or destructive lesion. IMPRESSION: 1. Generalized paucity of small bowel gas in the central abdomen, which limits evaluation for small bowel obstruction. This could reflect changes of fluid-filled small bowel from underlying enteritis. However, if small bowel obstruction remains of clinical concern, a follow-up CT of the abdomen and pelvis with IV contrast could be considered. 2. Moderate volume fecal loading in the rectum, which can be seen in constipation. 3. No acute cardiopulmonary abnormality. Electronically Signed   By: Rogelia Myers M.D.   On: 01/04/2024 19:59       Elgie Butter M.D. [facility] 01/06/2024, 6:20 PM  Available via Epic secure chat 7am-7pm After 7 pm, please refer to night coverage provider listed on amion.

## 2024-01-06 NOTE — Plan of Care (Signed)
 Patient continues to be seizure free.   Pending Keppra  level.  If abnormal, please reach out for re-loading.  Keven Soucy, MD [facility]

## 2024-01-07 DIAGNOSIS — E039 Hypothyroidism, unspecified: Secondary | ICD-10-CM | POA: Diagnosis not present

## 2024-01-07 DIAGNOSIS — R569 Unspecified convulsions: Secondary | ICD-10-CM | POA: Diagnosis not present

## 2024-01-07 DIAGNOSIS — F0390 Unspecified dementia without behavioral disturbance: Secondary | ICD-10-CM | POA: Diagnosis not present

## 2024-01-07 DIAGNOSIS — I10 Essential (primary) hypertension: Secondary | ICD-10-CM | POA: Diagnosis not present

## 2024-01-07 LAB — CBC
HCT: 41.7 % (ref 39.0–52.0)
Hemoglobin: 14.2 g/dL (ref 13.0–17.0)
MCH: 29.3 pg (ref 26.0–34.0)
MCHC: 34.1 g/dL (ref 30.0–36.0)
MCV: 86 fL (ref 80.0–100.0)
Platelets: 201 K/uL (ref 150–400)
RBC: 4.85 MIL/uL (ref 4.22–5.81)
RDW: 13.5 % (ref 11.5–15.5)
WBC: 8.5 K/uL (ref 4.0–10.5)
nRBC: 0 % (ref 0.0–0.2)

## 2024-01-07 LAB — BASIC METABOLIC PANEL WITH GFR
Anion gap: 10 (ref 5–15)
BUN: 8 mg/dL (ref 8–23)
CO2: 23 mmol/L (ref 22–32)
Calcium: 8.6 mg/dL — ABNORMAL LOW (ref 8.9–10.3)
Chloride: 107 mmol/L (ref 98–111)
Creatinine, Ser: 1.07 mg/dL (ref 0.61–1.24)
GFR, Estimated: >60 mL/min (ref >60)
Glucose, Bld: 80 mg/dL (ref 70–99)
Potassium: 4.2 mmol/L (ref 3.5–5.1)
Sodium: 140 mmol/L (ref 135–145)

## 2024-01-07 LAB — LEVETIRACETAM LEVEL: Levetiracetam Lvl: 28.9 ug/mL (ref 10.0–40.0)

## 2024-01-07 MED ORDER — LEVETIRACETAM 100 MG/ML PO SOLN
1000.0000 mg | Freq: Two times a day (BID) | ORAL | Status: DC
Start: 2024-01-07 — End: 2024-01-08
  Administered 2024-01-07 – 2024-01-08 (×2): 1000 mg via ORAL
  Filled 2024-01-07 (×3): qty 10

## 2024-01-07 MED ORDER — DIVALPROEX SODIUM 125 MG PO CSDR
750.0000 mg | DELAYED_RELEASE_CAPSULE | Freq: Two times a day (BID) | ORAL | Status: DC
  Administered 2024-01-07 – 2024-01-08 (×2): 750 mg via ORAL
  Filled 2024-01-07 (×3): qty 6

## 2024-01-07 NOTE — Evaluation (Signed)
 Physical Therapy Evaluation Patient Details Name: Tyrone Noble MRN: [id number] DOB: [birth year] Today's Date: 01/07/2024  History of Present Illness  Pt is a 73 y.o. male presenting on 10/17 from memory care unit for confusion, vomiting, and suspected seizure. Admitted for suspected breakthrough seizure, questionable UTI.  Imaging negative. PMH includes: HTN, CAD, CVA, dementia, parkinsonism, seizure disorder.  Clinical Impression   Pt presents with generalized weakness, impaired balance, poor activity tolerance, and L flank pain. Pt to benefit from acute PT to address deficits. Pt requiring mod +2 safety assist for bed mobility, transfer into standing, and lateral stepping. Pt declines ambulation attempt, states I don't feel like it. Unsure of how much assist pt typically needs for ADLs and mobility at memory care facility, may need short-term rehab if his facility is unable to provide mod physical assist. PT to progress mobility as tolerated, and will continue to follow acutely.          If plan is discharge home, recommend the following: A lot of help with walking and/or transfers;A lot of help with bathing/dressing/bathroom;Direct supervision/assist for medications management;Supervision due to cognitive status   Can travel by private vehicle   Yes    Equipment Recommendations None recommended by PT  Recommendations for Other Services       Functional Status Assessment Patient has had a recent decline in their functional status and demonstrates the ability to make significant improvements in function in a reasonable and predictable amount of time.     Precautions / Restrictions Precautions Precautions: Fall Recall of Precautions/Restrictions: Impaired Restrictions Weight Bearing Restrictions Per Provider Order: No      Mobility  Bed Mobility Overal bed mobility: Needs Assistance Bed Mobility: Supine to Sit, Sit to Supine     Supine to sit: Min assist, HOB  elevated, +2 for safety/equipment Sit to supine: Mod assist, +2 for physical assistance   General bed mobility comments: assist for trunk and LE management, boost assist upon return to supine.    Transfers Overall transfer level: Needs assistance Equipment used: 2 person hand held assist, Rolling walker (2 wheels) Transfers: Sit to/from Stand, Bed to chair/wheelchair/BSC Sit to Stand: +2 safety/equipment, Mod assist   Step pivot transfers: +2 safety/equipment, Min assist       General transfer comment: assist for rise, steady, and lateral stepping towards HOB. use of RW once standing    Ambulation/Gait               General Gait Details: nt  Acupuncturist Bed    Modified Rankin (Stroke Patients Only)       Balance Overall balance assessment: Needs assistance Sitting-balance support: No upper extremity supported, Feet supported Sitting balance-Leahy Scale: Fair     Standing balance support: Bilateral upper extremity supported, During functional activity Standing balance-Leahy Scale: Poor                               Pertinent Vitals/Pain Pain Assessment Pain Assessment: Faces Faces Pain Scale: Hurts little more Pain Location: L flank Pain Descriptors / Indicators: Discomfort, Guarding Pain Intervention(s): Limited activity within patient's tolerance, Monitored during session, Repositioned    Home Living Family/patient expects to be discharged to:: Skilled nursing facility                   Additional Comments: LTC memory care  facility    Prior Function Prior Level of Function : Needs assist;Patient poor historian/Family not available             Mobility Comments: pt states he walks with a RW, unsure of accuracy given dementia ADLs Comments: pt states independence with ADLs, unsure of accuracy     Extremity/Trunk Assessment   Upper Extremity Assessment Upper Extremity Assessment:  Defer to OT evaluation    Lower Extremity Assessment Lower Extremity Assessment: Difficult to assess due to impaired cognition;Generalized weakness    Cervical / Trunk Assessment Cervical / Trunk Assessment: Normal  Communication   Communication Communication: Impaired Factors Affecting Communication: Hearing impaired    Cognition Arousal: Alert Behavior During Therapy: WFL for tasks assessed/performed   PT - Cognitive impairments: History of cognitive impairments                       PT - Cognition Comments: history of dementia, pt requiring one-step sequencing cues and increased time Following commands: Impaired Following commands impaired: Follows one step commands with increased time     Cueing Cueing Techniques: Verbal cues, Gestural cues     General Comments      Exercises     Assessment/Plan    PT Assessment Patient needs continued PT services  PT Problem List Decreased mobility;Decreased safety awareness;Decreased strength;Decreased activity tolerance;Decreased balance;Decreased knowledge of use of DME;Pain;Cardiopulmonary status limiting activity       PT Treatment Interventions DME instruction;Gait training;Therapeutic exercise;Patient/family education;Therapeutic activities;Balance training;Stair training;Functional mobility training;Neuromuscular re-education    PT Goals (Current goals can be found in the Care Plan section)  Acute Rehab PT Goals Time For Goal Achievement: 01/21/24 Potential to Achieve Goals: Good    Frequency Min 2X/week     Co-evaluation PT/OT/SLP Co-Evaluation/Treatment: Yes Reason for Co-Treatment: For patient/therapist safety;To address functional/ADL transfers PT goals addressed during session: Mobility/safety with mobility;Balance         AM-PAC PT 6 Clicks Mobility  Outcome Measure Help needed turning from your back to your side while in a flat bed without using bedrails?: A Little Help needed moving from  lying on your back to sitting on the side of a flat bed without using bedrails?: A Little Help needed moving to and from a bed to a chair (including a wheelchair)?: A Lot Help needed standing up from a chair using your arms (e.g., wheelchair or bedside chair)?: A Lot Help needed to walk in hospital room?: A Lot Help needed climbing 3-5 steps with a railing? : A Lot 6 Click Score: 14    End of Session   Activity Tolerance: Patient limited by fatigue Patient left: with bed alarm set;in bed;with call bell/phone within reach;Other (comment) (fall pads placed) Nurse Communication: Mobility status PT Visit Diagnosis: Other abnormalities of gait and mobility (R26.89);Muscle weakness (generalized) (M62.81)    Time: 1036-1100 PT Time Calculation (min) (ACUTE ONLY): 24 min   Charges:   PT Evaluation $PT Eval Low Complexity: 1 Low   PT General Charges $$ ACUTE PT VISIT: 1 Visit         Johana RAMAN, PT DPT Acute Rehabilitation Services Secure Chat Preferred  Office [phone]   Nial Hawe E Johna 01/07/2024, 11:46 AM

## 2024-01-07 NOTE — TOC Initial Note (Signed)
 Transition of Care [facility]) - Initial/Assessment Note    Patient Details  Name: Tyrone Noble MRN: [id number] Date of Birth: 06-05-1950  [facility] of [facility]) CM/SW Contact:    Montie LOISE Louder, LCSW Phone Number: 01/07/2024, 3:34 PM  Clinical Narrative:                 Patient is from [facility]- SNF requested evals from PT/OT and to submit request for rehab approval to insurance. Therapy eval completed- CSW requested CMA to start insurance authorization.  Spoke with patient's daughter, [name], - informed of possible d/c back to [facility] tomorrow, Explained, SNF wanted TOC to attempt to seek approval for PT/OT  once patient returned to SNF. She states understanding.   [place] updated.  Montie Louder, MSW, LCSW Clinical Social Worker      Expected Discharge Plan: Skilled Nursing Facility Barriers to Discharge: Insurance Authorization   Patient Goals and CMS Choice            Expected Discharge Plan and Services In-house Referral: Clinical Social Work     Living arrangements for the past 2 months: Skilled Nursing Facility                                      Prior Living Arrangements/Services Living arrangements for the past 2 months: Skilled Nursing Facility Lives with:: Self Patient language and need for interpreter reviewed:: No        Need for Family Participation in Patient Care: Yes (Comment) Care giver support system in place?: Yes (comment)   Criminal Activity/Legal Involvement Pertinent to Current Situation/Hospitalization: No - Comment as needed  Activities of Daily Living      Permission Sought/Granted Permission sought to share information with : Family Supports Permission granted to share information with : No (patient confused talk w/ his daughter)  Share Information with NAME: [name] Pouch  Permission granted to share info w AGENCY: SNF  Permission granted to share info w Relationship: daughter  Permission  granted to share info w Contact Information: [phone]  Emotional Assessment       Orientation: : Oriented to Self Alcohol / Substance Use: Not Applicable Psych Involvement: No (comment)  Admission diagnosis:  Lactic acid increased [E87.20] Seizures (HCC) [R56.9] Hypothermia, initial encounter [T68.XXXA] Patient Active Problem List   Diagnosis Date Noted   Hypothyroidism 01/04/2024   Lactic acidosis 01/04/2024   COVID-19 06/15/2022   Acute encephalopathy 02/22/2021   Failure to thrive in adult    COVID-19 virus infection 03/19/2020   Generalized weakness 03/19/2020   Essential hypertension 03/19/2020   Seizure disorder (HCC) 03/19/2020   Parkinsonism (HCC) 02/26/2018   Therapeutic drug monitoring 09/12/2017   Seizures (HCC) 05/31/2017   Dementia without behavioral disturbance (HCC) 05/31/2017   New-onset angina 02/16/2015   HYPERLIPIDEMIA 12/13/2009   ALCOHOLISM 07/07/2009   CALLUS, TOE 01/03/2007   DEPRESSION, MAJOR, RECURRENT 05/17/2006   TOBACCO DEPENDENCE 05/17/2006   HYPERTENSION, BENIGN SYSTEMIC 05/17/2006   HEMORRHOIDS, NOS 05/17/2006   GASTROESOPHAGEAL REFLUX, NO ESOPHAGITIS 05/17/2006   LEG PAIN OR KNEE PAIN 05/17/2006   PCP:  Regino Slater, MD Pharmacy:   [facility] - [place], [place] - [address] [place] [address] Phone: [phone] Fax: [phone]     Social Drivers of Health (SDOH) Social History: SDOH Screenings   Tobacco Use: Medium Risk (01/04/2024)   SDOH Interventions:  Readmission Risk Interventions     No data to display

## 2024-01-07 NOTE — Progress Notes (Signed)
 [facility]                                                                               Tyrone Noble, is a 73 y.o. male, DOB - [birth year], FMW:[id number] Admit date - 01/04/2024    Outpatient Primary MD for the patient is [name], [name], MD  LOS - 3  days    Brief summary  Tyrone Noble is a 73 y.o. male with medical history significant for hypertension, hyperlipidemia, CAD, history of CVA, dementia, parkinsonism, and seizure disorder who presents from his memory care unit for confusion, vomiting, and suspected seizure.   Patient was noted to be confused and lethargic at his nursing facility today, similar to after he has a seizure, but lasting longer than usual.  Patient was said to be having garbled speech, was saying please help me in the ED and also made comments about difficulty seeing and difficulty breathing.   His mental status eventually improved in the emergency department and, according to his daughter at the bedside, he was essentially back to baseline.  Head CT and CT of the abdomen and pelvis were also negative for acute findings.   Patient was evaluated by neurology in the emergency department, blood cultures were collected, Bair hugger was applied, and he was treated with 1 L LR,   Assessment & Plan    Assessment and Plan:  Suspected breakthrough seizure CT of the head is negative His VPA levels are above His Depacon was increased to 750 mg twice daily and Keppra  going 1000 mg twice daily Neurology on board and appreciate recommendations Therapy evaluations are pending. SLP recommending dysphagia 2 diet.  No events overnight. Transition to oral anti epileptics.  Plan for discharge back to memory care unit.    Abdominal pain probably from constipation Patient was started on senna, MiraLAX. Resolved.    Hypertension  Well controlled.     History of dementia in the setting of Parkinson's disease Continue with Sinemet  1 tab 4  times daily.    Questionable urinary tract infection Continue with Rocephin for 3 more days to complete the course Unfortunately urine cultures were not obtained on admission and patient already received a dose of antibiotics.  Hyperlipidemia Continue with the Lipitor  20 mg daily.   Elevated lactic acid suspect from seizures, dehydration and infection. Recommend continue IV fluids for another 24 hours.   History of CVA Continue with aspirin  and statin.   Estimated body mass index is 27.46 kg/m as calculated from the following:   Height as of this encounter: 5' 4 (1.626 m).   Weight as of this encounter: 72.6 kg.  Code Status: Full code DVT Prophylaxis:  enoxaparin  (LOVENOX ) injection 40 mg Start: 01/05/24 1000   Level of Care: Level of care: Progressive Family Communication: none at bedside.  Disposition Plan:     Remains inpatient appropriate: Back to [facility] memory care unit on Monday  Procedures:  None  Consultants:   Neurology  Antimicrobials:   Anti-infectives (From admission, onward)    Start     Dose/Rate Route Frequency Ordered Stop   01/05/24 1315  cefTRIAXone (ROCEPHIN) 1 g in sodium chloride  0.9 %  100 mL IVPB        1 g 200 mL/hr over 30 Minutes Intravenous Every 24 hours 01/05/24 1219 01/07/24 1428   01/04/24 2100  aztreonam (AZACTAM) 2 g in sodium chloride  0.9 % 100 mL IVPB  Status:  Discontinued        2 g 200 mL/hr over 30 Minutes Intravenous  Once 01/04/24 2045 01/04/24 2049   01/04/24 2100  metroNIDAZOLE (FLAGYL) IVPB 500 mg        500 mg 100 mL/hr over 60 Minutes Intravenous  Once 01/04/24 2045 01/05/24 0006   01/04/24 2100  vancomycin (VANCOCIN) IVPB 1000 mg/200 mL premix        1,000 mg 200 mL/hr over 60 Minutes Intravenous  Once 01/04/24 2045 01/05/24 0116   01/04/24 2100  ceFEPIme (MAXIPIME) 2 g in sodium chloride  0.9 % 100 mL IVPB        2 g 200 mL/hr over 30 Minutes Intravenous  Once 01/04/24 2049 01/04/24 2254         Medications  Scheduled Meds:  aspirin   81 mg Oral Daily   atorvastatin   20 mg Oral Daily   carbidopa -levodopa   1 tablet Oral QID   divalproex   750 mg Oral Q12H   enoxaparin  (LOVENOX ) injection  40 mg Subcutaneous Daily   levETIRAcetam   1,000 mg Oral BID   metoprolol  succinate  25 mg Oral Daily   polyethylene glycol  17 g Oral BID   risperiDONE   1 mg Oral QHS   senna  1 tablet Oral BID   sodium chloride  flush  3 mL Intravenous Q12H   Continuous Infusions:   PRN Meds:.acetaminophen  **OR** acetaminophen , bisacodyl, trimethobenzamide    Subjective:   Tyrone Noble was seen and examined today.   No new complaints.  Objective:   Vitals:   01/07/24 0330 01/07/24 0730 01/07/24 1100 01/07/24 1609  BP: (!) 162/90 132/84 127/75 (!) 156/81  Pulse: 97 70 66 89  Resp: (!) 21 14 17 18   Temp: 99.1 F (37.3 C) 98.3 F (36.8 C) 98.3 F (36.8 C) 98.3 F (36.8 C)  TempSrc: Oral Axillary Axillary Axillary  SpO2: 98% 97% 99% 96%  Weight:      Height:        Intake/Output Summary (Last 24 hours) at 01/07/2024 1811 Last data filed at 01/07/2024 1000 Gross per 24 hour  Intake 483 ml  Output 300 ml  Net 183 ml   Filed Weights   01/04/24 1644  Weight: 72.6 kg     Exam General exam: Appears calm and comfortable  Respiratory system: Clear to auscultation. Respiratory effort normal. Cardiovascular system: S1 & S2 heard, RRR.  Gastrointestinal system: Abdomen is nondistended, soft and nontender. Central nervous system: Alert and oriented.  Extremities: Symmetric 5 x 5 power. Skin: No rashes, Psychiatry:  Mood & affect appropriate.     Data Reviewed:  I have personally reviewed following labs and imaging studies   CBC Lab Results  Component Value Date   WBC 8.5 01/07/2024   RBC 4.85 01/07/2024   HGB 14.2 01/07/2024   HCT 41.7 01/07/2024   MCV 86.0 01/07/2024   MCH 29.3 01/07/2024   PLT 201 01/07/2024   MCHC 34.1 01/07/2024   RDW 13.5 01/07/2024    LYMPHSABS 2.0 01/04/2024   MONOABS 0.8 01/04/2024   EOSABS 0.0 01/04/2024   BASOSABS 0.0 01/04/2024     Last metabolic panel Lab Results  Component Value Date   NA 140 01/07/2024   K 4.2 01/07/2024  CL 107 01/07/2024   CO2 23 01/07/2024   BUN 8 01/07/2024   CREATININE 1.07 01/07/2024   GLUCOSE 80 01/07/2024   GFRNONAA >60 01/07/2024   GFRAA 77 01/29/2020   CALCIUM  8.6 (L) 01/07/2024   PHOS 3.6 06/15/2022   PROT 7.2 01/04/2024   ALBUMIN 4.0 01/04/2024   LABGLOB 2.6 07/17/2023   AGRATIO 1.9 05/09/2022   BILITOT 1.1 01/04/2024   ALKPHOS 82 01/04/2024   AST 33 01/04/2024   ALT 16 01/04/2024   ANIONGAP 10 01/07/2024    CBG (last 3)  No results for input(s): GLUCAP in the last 72 hours.     Coagulation Profile: Recent Labs  Lab 01/04/24 1702  INR 1.1     Radiology Studies: No results found.      Elgie Butter M.D. [facility] 01/07/2024, 6:11 PM  Available via Epic secure chat 7am-7pm After 7 pm, please refer to night coverage provider listed on amion.

## 2024-01-07 NOTE — Plan of Care (Signed)

## 2024-01-07 NOTE — Evaluation (Signed)
 Occupational Therapy Evaluation Patient Details Name: Tyrone Noble MRN: [id number] DOB: May 22, 1950 Today's Date: 01/07/2024   History of Present Illness   Pt is a 73 y.o. male presenting on 10/17 from memory care unit for confusion, vomiting, and suspected seizure. Admitted for suspected breakthrough seizure, questionable UTI.  Imaging negative. PMH includes: HTN, CAD, CVA, dementia, parkinsonism, seizure disorder.     Clinical Impressions PTA patient reports independent with ADLs, using RW for mobility but per chart living in memory care (so questionable historian).  Pt oriented to self but not place or situation, hx of dementia and parkinsonism.  He follows simple commands with increased time but no awareness of bowel incontinence during session. Needs mod assist +2 for bed mobility, transfers and limited side stepping at EOB; min to total assist +2 for Adls.  Will progress as able, with recommendations for follow up OT at <3hrs/day inpatient setting if facility is unable to provide needed assist.  Will follow.      If plan is discharge home, recommend the following:   A lot of help with bathing/dressing/bathroom;A lot of help with walking and/or transfers;Assistance with cooking/housework;Assist for transportation;Help with stairs or ramp for entrance;Direct supervision/assist for financial management;Direct supervision/assist for medications management;Supervision due to cognitive status     Functional Status Assessment   Patient has had a recent decline in their functional status and demonstrates the ability to make significant improvements in function in a reasonable and predictable amount of time.     Equipment Recommendations   Other (comment) (defer)     Recommendations for Other Services         Precautions/Restrictions   Precautions Precautions: Fall Recall of Precautions/Restrictions: Impaired Restrictions Weight Bearing Restrictions Per Provider  Order: No     Mobility Bed Mobility Overal bed mobility: Needs Assistance Bed Mobility: Supine to Sit, Sit to Supine     Supine to sit: Min assist, HOB elevated, +2 for safety/equipment Sit to supine: Mod assist, +2 for physical assistance   General bed mobility comments: assist for trunk and LE management, boost assist upon return to supine.    Transfers Overall transfer level: Needs assistance Equipment used: 2 person hand held assist, Rolling walker (2 wheels) Transfers: Sit to/from Stand, Bed to chair/wheelchair/BSC Sit to Stand: +2 safety/equipment, Mod assist     Step pivot transfers: +2 safety/equipment, Min assist     General transfer comment: assist for rise, steady, and lateral stepping towards HOB. use of RW once standing      Balance Overall balance assessment: Needs assistance Sitting-balance support: No upper extremity supported, Feet supported Sitting balance-Leahy Scale: Fair     Standing balance support: Bilateral upper extremity supported, During functional activity Standing balance-Leahy Scale: Poor                             ADL either performed or assessed with clinical judgement   ADL Overall ADL's : Needs assistance/impaired     Grooming: Set up;Sitting           Upper Body Dressing : Minimal assistance;Sitting   Lower Body Dressing: Moderate assistance;+2 for physical assistance;Sit to/from stand   Toilet Transfer: Moderate assistance;+2 for physical assistance;Rolling walker (2 wheels)   Toileting- Clothing Manipulation and Hygiene: Total assistance;+2 for physical assistance;Bed level Toileting - Clothing Manipulation Details (indicate cue type and reason): incontient of bowel in bed     Functional mobility during ADLs: Moderate assistance;Minimal assistance;+2 for physical assistance  Vision   Vision Assessment?: No apparent visual deficits     Perception         Praxis         Pertinent  Vitals/Pain Pain Assessment Pain Assessment: Faces Faces Pain Scale: Hurts little more Pain Location: L flank Pain Descriptors / Indicators: Discomfort, Guarding Pain Intervention(s): Limited activity within patient's tolerance, Monitored during session, Repositioned     Extremity/Trunk Assessment Upper Extremity Assessment Upper Extremity Assessment: Generalized weakness;Difficult to assess due to impaired cognition   Lower Extremity Assessment Lower Extremity Assessment: Defer to PT evaluation   Cervical / Trunk Assessment Cervical / Trunk Assessment: Normal   Communication Communication Communication: Impaired Factors Affecting Communication: Hearing impaired   Cognition Arousal: Alert Behavior During Therapy: WFL for tasks assessed/performed Cognition: History of cognitive impairments             OT - Cognition Comments: hx of dementia and parkinsons.  pt oriented to self but not place.  follows simple commands with increased time.  unaware of bowel incontience                 Following commands: Impaired Following commands impaired: Follows one step commands with increased time     Cueing  General Comments   Cueing Techniques: Verbal cues;Gestural cues      Exercises     Shoulder Instructions      Home Living Family/patient expects to be discharged to:: Skilled nursing facility                                 Additional Comments: LTC memory care facility      Prior Functioning/Environment Prior Level of Function : Needs assist;Patient poor historian/Family not available             Mobility Comments: pt states he walks with a RW, unsure of accuracy given dementia ADLs Comments: pt states independence with ADLs, unsure of accuracy    OT Problem List: Decreased strength;Decreased activity tolerance;Impaired balance (sitting and/or standing);Decreased knowledge of use of DME or AE;Decreased knowledge of precautions;Decreased  safety awareness   OT Treatment/Interventions: Self-care/ADL training;Therapeutic exercise;DME and/or AE instruction;Therapeutic activities;Balance training;Patient/family education      OT Goals(Current goals can be found in the care plan section)   Acute Rehab OT Goals Patient Stated Goal: none stated OT Goal Formulation: Patient unable to participate in goal setting Time For Goal Achievement: 01/21/24 Potential to Achieve Goals: Good   OT Frequency:  Min 2X/week    Co-evaluation PT/OT/SLP Co-Evaluation/Treatment: Yes Reason for Co-Treatment: For patient/therapist safety;To address functional/ADL transfers PT goals addressed during session: Mobility/safety with mobility;Balance OT goals addressed during session: ADL's and self-care      AM-PAC OT 6 Clicks Daily Activity     Outcome Measure Help from another person eating meals?: A Little Help from another person taking care of personal grooming?: A Little Help from another person toileting, which includes using toliet, bedpan, or urinal?: A Lot Help from another person bathing (including washing, rinsing, drying)?: A Lot Help from another person to put on and taking off regular upper body clothing?: A Little Help from another person to put on and taking off regular lower body clothing?: A Lot 6 Click Score: 15   End of Session Equipment Utilized During Treatment: Rolling walker (2 wheels) Nurse Communication: Mobility status;Precautions  Activity Tolerance: Patient tolerated treatment well Patient left: in bed;with call bell/phone within reach;with bed alarm set;with  SCD's reapplied  OT Visit Diagnosis: Other abnormalities of gait and mobility (R26.89);Muscle weakness (generalized) (M62.81);Other symptoms and signs involving cognitive function                Time: 8963-8941 OT Time Calculation (min): 22 min Charges:  OT General Charges $OT Visit: 1 Visit OT Evaluation $OT Eval Moderate Complexity: 1 Mod  Etta NOVAK, OT Acute Rehabilitation Services Office [phone] Secure Chat Preferred    Etta GORMAN Hope 01/07/2024, 1:40 PM

## 2024-01-08 DIAGNOSIS — F0390 Unspecified dementia without behavioral disturbance: Secondary | ICD-10-CM | POA: Diagnosis not present

## 2024-01-08 DIAGNOSIS — R569 Unspecified convulsions: Secondary | ICD-10-CM | POA: Diagnosis not present

## 2024-01-08 DIAGNOSIS — I10 Essential (primary) hypertension: Secondary | ICD-10-CM | POA: Diagnosis not present

## 2024-01-08 DIAGNOSIS — E039 Hypothyroidism, unspecified: Secondary | ICD-10-CM | POA: Diagnosis not present

## 2024-01-08 MED ORDER — POLYETHYLENE GLYCOL 3350 17 G PO PACK: 17.0000 g | PACK | Freq: Every day | ORAL | 0 refills | Status: AC | PRN

## 2024-01-08 MED ORDER — BISACODYL 10 MG RE SUPP: 10.0000 mg | Freq: Every day | RECTAL | 0 refills | Status: AC | PRN

## 2024-01-08 MED ORDER — SENNA 8.6 MG PO TABS: 1.0000 | ORAL_TABLET | Freq: Every day | ORAL | 0 refills | Status: AC

## 2024-01-08 MED ORDER — DIVALPROEX SODIUM 125 MG PO CSDR: 750.0000 mg | DELAYED_RELEASE_CAPSULE | Freq: Two times a day (BID) | ORAL | 2 refills | Status: DC

## 2024-01-08 NOTE — TOC Transition Note (Signed)
 Transition of Care [facility]) - Discharge Note   Patient Details  Name: Tyrone Noble MRN: [id number] Date of Birth: 1950-07-05  [facility] of [facility]) CM/SW Contact:  Montie LOISE Louder, LCSW Phone Number: 01/08/2024, 12:19 PM   Clinical Narrative:     Patient will Discharge to: [facility] Discharge Date: 01/08/2024 Family Notified: LVM w/ daughter [employer] cabin crew Ab:EUJM  Per MD patient is ready for discharge. RN, patient, and facility notified of discharge. Discharge Summary sent to facility. RN given number for [phone], Room 107-B. Ambulance transport requested for patient.   Clinical Social Worker signing off.  Montie Louder, MSW, LCSW Clinical Social Worker     Final next level of care: Skilled Nursing Facility Barriers to Discharge: Barriers Resolved   Patient Goals and CMS Choice            Discharge Placement              Patient chooses bed at: [facility] Patient to be transferred to facility by: [facility] Name of family member notified: LVM w/ daughter [name] Patient and family notified of of transfer: 01/08/24  Discharge Plan and Services Additional resources added to the After Visit Summary for   In-house Referral: Clinical Social Work                                   Social Drivers of Health (SDOH) Interventions SDOH Screenings   Tobacco Use: Medium Risk (01/04/2024)     Readmission Risk Interventions     No data to display

## 2024-01-08 NOTE — Care Management Important Message (Signed)
 Important Message  Patient Details  Name: Tyrone Noble MRN: [id number] Date of Birth: 1951/01/15   Important Message Given:  Yes - Medicare IM     Jon Cruel 01/08/2024, 4:13 PM

## 2024-01-08 NOTE — Progress Notes (Signed)
 Order to discharge to SNF [facility]. Discharge instructions/AVS given to and reviewed with Darice PEAK .  2 PIV'S removed by  RN. Personal belongings sent home with the patient. [facility] took patient to [facility].

## 2024-01-08 NOTE — Progress Notes (Signed)
 Speech Language Pathology Treatment: Dysphagia  Patient Details Name: YIGIT NORKUS MRN: [id number] DOB: May 10, 1950 Today's Date: 01/08/2024 Time: 8959-8947 SLP Time Calculation (min) (ACUTE ONLY): 12 min  Assessment / Plan / Recommendation Clinical Impression  Patient seen by SLP for skilled treatment focused on dysphagia goals. He was awake, alert sitting in recliner. Breakfast tray sitting in front of him; he had eaten 90% of eggs but none of the other items. SLP assessed his toleration of dysphagia 3 solids. Initially, he required assistance to feeding but he was then able to feed self independently. No overt s/s aspiration, mastication and oral transit both WFL. SLP recommending advance to  Dys 3 (mechanical soft), thin liquids and will s/o at this time.    HPI HPI: Patient is a 73 y.o. male with PMH: HTN, HLD, CAD, CVA, dementia, Parkinsonism, seizure disorder. He presented to the hospital from his memory care unit for confusion, vomiting and suspected seizure. In ED, he was hypothermic, CXR, CT head, CT abdomen/pelvis were all negative for acute findings.      SLP Plan             Recommendations  Diet recommendations: Dysphagia 3 (mechanical soft);Thin liquid Liquids provided via: Cup;Straw Medication Administration: Other (Comment) (as tolerated) Supervision: Patient able to self feed;Intermittent supervision to cue for compensatory strategies Compensations: Slow rate;Small sips/bites Postural Changes and/or Swallow Maneuvers: Seated upright 90 degrees                                    Norleen IVAR Blase, MA, CCC-SLP Speech Therapy

## 2024-01-08 NOTE — Plan of Care (Signed)

## 2024-01-08 NOTE — Discharge Summary (Signed)
 Physician Discharge Summary   Patient: Tyrone Noble MRN: [id number] DOB: [birth year]  Admit date:     01/04/2024  Discharge date: 01/08/24  Discharge Physician: Elgie Butter   PCP: Regino Slater, MD   Recommendations at discharge:  Please follow up with neurology as recommended.  Please check cbc and bmp in one week.   Discharge Diagnoses: Principal Problem:   Seizures (HCC) Active Problems:   Dementia without behavioral disturbance (HCC)   Parkinsonism (HCC)   Essential hypertension   Hypothyroidism   Lactic acidosis  Resolved Problems:   * No resolved hospital problems. [facility] Course: Tyrone Noble is a 73 y.o. male with medical history significant for hypertension, hyperlipidemia, CAD, history of CVA, dementia, parkinsonism, and seizure disorder who presents from his memory care unit for confusion, vomiting, and suspected seizure.    Patient was noted to be confused and lethargic at his nursing facility today, similar to after he has a seizure, but lasting longer than usual.  Patient was said to be having garbled speech, was saying please help me in the ED and also made comments about difficulty seeing and difficulty breathing.   His mental status eventually improved in the emergency department and, according to his daughter at the bedside, he was essentially back to baseline.   Head CT and CT of the abdomen and pelvis were also negative for acute findings.   Patient was evaluated by neurology in the emergency department, blood cultures were collected, Bair hugger was applied, and he was treated with 1 L LR,   Assessment and Plan:   Suspected breakthrough seizure CT of the head is negative His VPA levels are above His Depacon was increased to 750 mg twice daily and Keppra  going 1000 mg twice daily Neurology on board and appreciate recommendations Therapy evaluations are pending. SLP recommending dysphagia 2 diet.  No events overnight. Transition to  oral anti epileptics.  Plan for discharge back to memory care unit.      Abdominal pain probably from constipation Patient was started on senna, MiraLAX. Resolved.      Hypertension  Well controlled.        History of dementia in the setting of Parkinson's disease Continue with Sinemet  1 tab 4 times daily.       Questionable urinary tract infection Completed the rocephin course.  Unfortunately urine cultures were not obtained on admission and patient already received a dose of antibiotics.   Hyperlipidemia Continue with the Lipitor  20 mg daily.     Elevated lactic acid suspect from seizures, dehydration and infection.      History of CVA Continue with aspirin  and statin.     Estimated body mass index is 27.46 kg/m as calculated from the following:   Height as of this encounter: 5' 4 (1.626 m).   Weight as of this encounter: 72.6 kg.     Consultants: neurology.  Procedures performed: none.   Disposition: Skilled nursing facility Diet recommendation:  Discharge Diet Orders (From admission, onward)     Start     Ordered   01/08/24 0000  Diet - low sodium heart healthy        01/08/24 1113           Dysphagia type 3 thin Liquid DISCHARGE MEDICATION: Allergies as of 01/08/2024       Reactions   Aricept  [donepezil  Hcl] Nausea Only   Penicillins Nausea And Vomiting        Medication List  TAKE these medications    aspirin  81 MG chewable tablet Chew 1 tablet (81 mg total) by mouth daily.   atorvastatin  20 MG tablet Commonly known as: LIPITOR  Take 20 mg by mouth daily.   bisacodyl 10 MG suppository Commonly known as: DULCOLAX Place 1 suppository (10 mg total) rectally daily as needed for moderate constipation.   carbidopa -levodopa  25-250 MG tablet Commonly known as: SINEMET  IR TAKE 1 TABLET 4 TIMES A DAY What changed: See the new instructions.   divalproex  125 MG capsule Commonly known as: DEPAKOTE  SPRINKLE Take 6 capsules (750 mg  total) by mouth every 12 (twelve) hours. What changed:  how much to take when to take this   levETIRAcetam  1000 MG tablet Commonly known as: Keppra  Take 1 tablet (1,000 mg total) by mouth 2 (two) times daily.   losartan -hydrochlorothiazide 100-12.5 MG tablet Commonly known as: HYZAAR Take 1 tablet by mouth daily.   metoprolol  succinate 25 MG 24 hr tablet Commonly known as: TOPROL -XL Take 25 mg by mouth See admin instructions. Take 25 mg by mouth in the morning and hold for a Systolic reading less than 100 or heart rate lower than 60   polyethylene glycol 17 g packet Commonly known as: MIRALAX / GLYCOLAX Take 17 g by mouth daily as needed.   risperiDONE  1 MG tablet Commonly known as: RISPERDAL  Take 1 tablet (1 mg total) by mouth at bedtime. What changed: how much to take   senna 8.6 MG Tabs tablet Commonly known as: SENOKOT Take 1 tablet (8.6 mg total) by mouth daily.        Follow-up Information     [name], [name], MD. Schedule an appointment as soon as possible for a visit in 1 week(s).   Specialty: Family Medicine Contact information: [address] [place] [address] [place] [address] [phone]                Discharge Exam: Tyrone Noble   01/04/24 1644  Weight: 72.6 kg   General exam: Appears calm and comfortable  Respiratory system: Clear to auscultation. Respiratory effort normal. Cardiovascular system: S1 & S2 heard, RRR. No JVD,  Gastrointestinal system: Abdomen is nondistended, soft and nontender. Central nervous system: Alert and oriented.  Extremities: Symmetric 5 x 5 power. Skin: No rashes,  Psychiatry:Mood & affect appropriate.    Condition at discharge: fair  The results of significant diagnostics from this hospitalization (including imaging, microbiology, ancillary and laboratory) are listed below for reference.   Imaging Studies: EEG adult Result Date: 01/05/2024 Shelton Arlin KIDD, MD     01/05/2024  7:08 AM Patient Name:  Tyrone Noble MRN: [id number] Epilepsy Attending: Arlin KIDD Shelton Referring Physician/Provider: Voncile Isles, MD Date: 01/05/2024 Duration: 23.06 mins Patient history: 73 y.o. male with hx of dementia, parkinson's disease, seizure disorder on keppra  and depakote , HTN, HLD, CAD, and prior stroke who presents from his facility for 5 minute unwitnessed seizure. EEG to evaluate for seizure Level of alertness: Awake AEDs during EEG study: LEV, VPA Technical aspects: This EEG study was done with scalp electrodes positioned according to the 10-20 International system of electrode placement. Electrical activity was reviewed with band pass filter of 1-70Hz , sensitivity of 7 uV/mm, display speed of 35mm/sec with a 60Hz  notched filter applied as appropriate. EEG data were recorded continuously and digitally stored.  Video monitoring was available and reviewed as appropriate. Description: EEG showed continuous generalized 5 to 7 Hz theta slowing admixed with intermittent 2-3hz  delta slowing. Hyperventilation and photic stimulation were  not performed.    ABNORMALITY - Continuous slow, generalized  IMPRESSION: This study is suggestive of generalized cerebral dysfunction (encephalopathy). No seizures or epileptiform discharges were seen throughout the recording.  Priyanka MALVA Krebs   CT ABDOMEN PELVIS W CONTRAST Result Date: 01/04/2024 EXAM: CT ABDOMEN AND PELVIS WITH CONTRAST 01/04/2024 10:23:00 PM TECHNIQUE: CT of the abdomen and pelvis was performed with the administration of 75 mL of iohexol  (OMNIPAQUE ) 350 MG/ML injection. Multiplanar reformatted images are provided for review. Automated exposure control, iterative reconstruction, and/or weight-based adjustment of the mA/kV was utilized to reduce the radiation dose to as low as reasonably achievable. COMPARISON: CT 06/05/2018 and same day of abdominal radiograph. CLINICAL HISTORY: Abdominal pain, acute, nonlocalized. FINDINGS: LOWER CHEST: No acute abnormality.  LIVER: The liver is unremarkable. GALLBLADDER AND BILE DUCTS: Gallbladder is unremarkable. No biliary ductal dilatation. SPLEEN: No acute abnormality. PANCREAS: No acute abnormality. ADRENAL GLANDS: No acute abnormality. KIDNEYS, URETERS AND BLADDER: Punctate nonobstructing stone in the lower pole of the right kidney. No stones in the ureters. No hydronephrosis. No perinephric or periureteral stranding. Urinary bladder is unremarkable. GI AND BOWEL: Stomach demonstrates no acute abnormality. Moderate stool burden in the distal colon and rectum. There is no bowel obstruction. Normal appendix. PERITONEUM AND RETROPERITONEUM: No ascites. No free air. VASCULATURE: Aorta is normal in caliber. Aortic atherosclerotic calcification. LYMPH NODES: No lymphadenopathy. REPRODUCTIVE ORGANS: No acute abnormality. BONES AND SOFT TISSUES: No acute osseous abnormality. No focal soft tissue abnormality. No abdominal wall thickening. IMPRESSION: 1. No acute abnormality in the abdomen or pelvis. 2. Punctate nonobstructing right renal calculus. 3. Moderate stool burden in the distal colon and rectum. Electronically signed by: Norman Gatlin MD 01/04/2024 11:04 PM EDT RP Workstation: [id number]   DG Abdomen Acute W/Chest Result Date: 01/04/2024 CLINICAL DATA:  pain EXAM: DG ABDOMEN ACUTE WITH 1 VIEW CHEST COMPARISON:  04/06/2023 FINDINGS: Moderate air-fluid level within the stomach. Generalized paucity of small bowel gas in the central abdomen. Minimal gas in the colon. Moderate volume fecal loading in the rectum.No pneumoperitoneum. No organomegaly or radiopaque calculi. No focal airspace consolidation, pleural effusion, or pneumothorax. No cardiomegaly.No acute fracture or destructive lesion. IMPRESSION: 1. Generalized paucity of small bowel gas in the central abdomen, which limits evaluation for small bowel obstruction. This could reflect changes of fluid-filled small bowel from underlying enteritis. However, if small bowel  obstruction remains of clinical concern, a follow-up CT of the abdomen and pelvis with IV contrast could be considered. 2. Moderate volume fecal loading in the rectum, which can be seen in constipation. 3. No acute cardiopulmonary abnormality. Electronically Signed   By: Rogelia Myers M.D.   On: 01/04/2024 19:59   CT HEAD CODE STROKE WO CONTRAST (LKW 0-4.5h, LVO 0-24h) Result Date: 01/04/2024 EXAM: CT HEAD WITHOUT CONTRAST 01/04/2024 05:13:31 PM TECHNIQUE: CT of the head was performed without the administration of intravenous contrast. Automated exposure control, iterative reconstruction, and/or weight based adjustment of the mA/kV was utilized to reduce the radiation dose to as low as reasonably achievable. COMPARISON: CT head 04/06/2023. CLINICAL HISTORY: Neuro deficit, acute, stroke suspected. No contrast. Dr. Sallyann [phone]; Pt coming from SNF with unwitnessed 5 minute seizure. Pt post ictal, vomiting, pale, and clammy. Pt not able to answer questions at this time. FINDINGS: BRAIN AND VENTRICLES: No acute hemorrhage. No evidence of acute infarct. No hydrocephalus. No extra-axial collection. No mass effect or midline shift. Chronic lacunar infarct in the right centrum semiovale again noted. Nonspecific hypoattenuation in the periventricular and subcortical white matter,  most likely representing chronic microvascular ischemic changes. Generalized parenchymal volume loss. Atherosclerosis of the carotid siphons. ORBITS: No acute abnormality. SINUSES: Small right mastoid effusion. SOFT TISSUES AND SKULL: No acute soft tissue abnormality. No skull fracture. [place] stroke program early CT (aspect) score: Ganglionic (caudate, ic, lentiform nucleus, insula, M1-m3): 7 Supraganglionic (m4-m6): 3 Total: 10 IMPRESSION: 1. No acute intracranial abnormality. 2. Chronic lacunar infarct in the right centrum semiovale. 3. Chronic microvascular ischemic changes. 4. Generalized parenchymal volume loss. 5. Findings  messaged to Dr. Sallyann at 5:26PM on 01/04/24. Electronically signed by: Donnice Mania MD 01/04/2024 05:27 PM EDT RP Workstation: [id number]    Microbiology: Results for orders placed or performed during the hospital encounter of 01/04/24  Culture, blood (routine x 2)     Status: None (Preliminary result)   Collection Time: 01/04/24  7:03 PM   Specimen: BLOOD  Result Value Ref Range Status   Specimen Description BLOOD RIGHT ANTECUBITAL  Final   Special Requests   Final    BOTTLES DRAWN AEROBIC AND ANAEROBIC Blood Culture results may not be optimal due to an inadequate volume of blood received in culture bottles   Culture   Final    NO GROWTH 4 DAYS Performed at [facility], [address] [place], [place] [address]    Report Status PENDING  Incomplete  Culture, blood (routine x 2)     Status: None (Preliminary result)   Collection Time: 01/04/24  8:22 PM   Specimen: BLOOD  Result Value Ref Range Status   Specimen Description BLOOD LEFT ANTECUBITAL  Final   Special Requests   Final    BOTTLES DRAWN AEROBIC ONLY Blood Culture adequate volume   Culture   Final    NO GROWTH 4 DAYS Performed at [facility], [address] [place], [place] [address]    Report Status PENDING  Incomplete  MRSA Next Gen by PCR, Nasal     Status: None   Collection Time: 01/05/24  1:53 PM   Specimen: Nasal Mucosa; Nasal Swab  Result Value Ref Range Status   MRSA by PCR Next Gen NOT DETECTED NOT DETECTED Final    Comment: (NOTE) The GeneXpert MRSA Assay (FDA approved for NASAL specimens only), is one component of a comprehensive MRSA colonization surveillance program. It is not intended to diagnose MRSA infection nor to guide or monitor treatment for MRSA infections. Test performance is not FDA approved in patients less than 44 years old. Performed at [facility], [address], [place], [place] [address]     Labs: CBC: Recent Labs  Lab 01/04/24 1702 01/04/24 1749  01/05/24 0435 01/06/24 0552 01/07/24 0542  WBC 8.4  --  8.5 7.5 8.5  NEUTROABS 5.5  --   --   --   --   HGB 14.8 15.6 12.6* 13.0 14.2  HCT 43.3 46.0 37.5* 39.0 41.7  MCV 85.2  --  86.6 86.3 86.0  PLT 244  --  210 196 201   Basic Metabolic Panel: Recent Labs  Lab 01/04/24 1702 01/04/24 1749 01/05/24 0435 01/06/24 0552 01/07/24 0542  NA 140 137 137 139 140  K 3.7 3.6 3.9 3.4* 4.2  CL 102 102 106 106 107  CO2 15*  --  23 24 23   GLUCOSE 176* 180* 115* 83 80  BUN 15 15 13 9 8   CREATININE 1.34* 1.10 1.09 1.01 1.07  CALCIUM  9.1  --  8.2* 8.4* 8.6*  MG  --   --  1.7  --   --    Liver Function Tests: Recent Labs  Lab 01/04/24 1702  AST 33  ALT 16  ALKPHOS 82  BILITOT 1.1  PROT 7.2  ALBUMIN 4.0   CBG: Recent Labs  Lab 01/04/24 1701  GLUCAP 160*    Discharge time spent: 41 minutes.   Signed: Elgie Butter, MD [facility] 01/08/2024

## 2024-01-09 LAB — CULTURE, BLOOD (ROUTINE X 2)
Culture: NO GROWTH
Culture: NO GROWTH
Special Requests: ADEQUATE

## 2024-01-14 ENCOUNTER — Ambulatory Visit: Admitting: Neurology

## 2024-01-14 ENCOUNTER — Encounter: Payer: Self-pay | Admitting: Neurology

## 2024-01-14 VITALS — BP 117/80 | HR 61 | Resp 15

## 2024-01-14 DIAGNOSIS — F02C3 Dementia in other diseases classified elsewhere, severe, with mood disturbance: Secondary | ICD-10-CM

## 2024-01-14 DIAGNOSIS — R269 Unspecified abnormalities of gait and mobility: Secondary | ICD-10-CM | POA: Insufficient documentation

## 2024-01-14 DIAGNOSIS — G20C Parkinsonism, unspecified: Secondary | ICD-10-CM | POA: Diagnosis not present

## 2024-01-14 DIAGNOSIS — G40909 Epilepsy, unspecified, not intractable, without status epilepticus: Secondary | ICD-10-CM | POA: Diagnosis not present

## 2024-01-14 DIAGNOSIS — G301 Alzheimer's disease with late onset: Secondary | ICD-10-CM | POA: Insufficient documentation

## 2024-01-14 NOTE — Progress Notes (Signed)
 Chief Complaint  Patient presents with   Follow-up    Rm15, caregiver from [facility] present, Sz follow up: pt unable to answer when last sz occurred and the caregiver from facility is unaware. Parkinson: seems to be under control. The caregiver stated that the pt was recently in hospital    ASSESSMENT AND PLAN  73 y.o. year old male  1.  Parkinsonism 2.  Dementia with behavioral issues 3.  Epilepsy  Increased confusion and agitation leading to hospital admission in October 2025, had a UTI, improved after treatment  His Depakote  was increased from 125 2 twice a day to 750 mg twice a day,    Also on Keppra  1000 mg twice a day  Sinemet  25/250 twice a day, moderate parkinsonian features, remain ambulatory with assistant,  Agitation is well-controlled with low-dose Risperdal  1 mg every night Continue current medications, suggest facility physical therapy,  Return to clinic in 1 year   DIAGNOSTIC DATA (LABS, IMAGING, TESTING) - I reviewed patient records, labs, notes, testing and imaging myself where available.   MEDICAL HISTORY:  Tyrone Noble is a nursing home resident, seen in request by his primary care doctor [name], [name] for evaluation of dementia, seizure, epilepsy  History is obtained from the patient and review of electronic medical records. I personally reviewed pertinent available imaging films in PACS.   PMHx of  Hypertension Hyperlipidemia Coronary artery disease Depression anxiety  He is a retired naval architect, developed gradual onset memory loss around 2013, retired early due to that, also had a history of alcohol abuse, in remission for many years  He had a seizure was treated at a hospital in March 2019, MRI of the brain showed generalized atrophy moderate supratentorial and small vessel disease, at that visit, Mini-Mental status examination 18 out of 30  He was treated with Depakote  despite titrating dose up to 500 mg 2 tablets every night,  he continued to have seizure, Keppra  was added on 1000 mg twice a day since October 2023 after recurrent seizure,  Late 2020 he was noted to have worsening gait abnormality parkinsonian features, Sinemet  was added on, did help his walking  He has gradual decline memory loss, agitation, difficulty keeping up his epileptic medication at home, Risperdal  was introduced around August 2023 for nighttime agitations, worsening gait abnormality despite titrating dose of sentiment,  Acute encephalopathy in March 2024 during COVID,  Eventually he was admitted to nursing home at the beginning of 2025, for increased agitation, confusion, prolonged EEG by Dr. Gregg November 2024, showed intermittent right temporal focal slowing  Hospital admission October 17-21 2025 for increased confusion, lethargy,  Repeat CT head without contrast showed no acute abnormality  EEG January 05, 2024 continues generalized slowing, CBC, CMP, TSH showed no significant abnormality, evidence of UTI, treated with Rocephin  Depakote  level 25 (taking Depakote  125mg  2 bid), Keppra  28.9, was discharged with higher dose of Depakote  125 capsule 6 every 12 hours, Keppra  1000 mg twice a day, Risperdal  1 mg every night, continued on Sinemet  25/250 mg 4 times a day (8/;30, 12:30, 16:30, 20:30)  He is brought in by [facility], overall stable since hospital discharge, his daughter is a child psychotherapist there, still ambulate with walker some, no recurrent seizure    PHYSICAL EXAM:   Vitals:   01/14/24 1314  BP: 117/80  Pulse: 61  Resp: 15  SpO2: 95%     PHYSICAL EXAMNIATION:  Gen: NAD, conversant, well nourised, well groomed  Cardiovascular: Regular rate rhythm, no peripheral edema, warm, nontender. Eyes: Conjunctivae clear without exudates or hemorrhage Neck: Supple, no carotid bruits. Pulmonary: Clear to auscultation bilaterally   NEUROLOGICAL EXAM:  MENTAL STATUS: Speech/cognition: Awake, alert,  cooperative on examination, no his date of birth, but not oriented to year, age, month, CRANIAL NERVES: CN II: Visual fields are full to confrontation. Pupils are round equal and briskly reactive to light. CN III, IV, VI: extraocular movement are normal. No ptosis. CN V: Facial sensation is intact to light touch CN VII: Face is symmetric with normal eye closure  CN VIII: Hearing is normal to causal conversation. CN IX, X: Phonation is normal. CN XI: Head turning and shoulder shrug are intact  MOTOR: Normal strength, right more than left rigidity, bradykinesia  REFLEXES: Reflexes are 1 and symmetric at the biceps, triceps, knees, and ankles. Plantar responses are flexor.  SENSORY: Intact to light touch, pinprick and vibratory sensation are intact in fingers and toes.  COORDINATION: There is no trunk or limb dysmetria noted.  GAIT/STANCE: Need push-up, multiple after to get up from seated position, rigid, difficulty initiate shuffling gait, unsteady  REVIEW OF SYSTEMS:  Full 14 system review of systems performed and notable only for as above All other review of systems were negative.   ALLERGIES: Allergies  Allergen Reactions   Aricept  [Donepezil  Hcl] Nausea Only   Penicillins Nausea And Vomiting    HOME MEDICATIONS: Current Outpatient Medications  Medication Sig Dispense Refill   aspirin  81 MG chewable tablet Chew 1 tablet (81 mg total) by mouth daily. 30 tablet 3   atorvastatin  (LIPITOR ) 20 MG tablet Take 20 mg by mouth daily.   5   bisacodyl (DULCOLAX) 10 MG suppository Place 1 suppository (10 mg total) rectally daily as needed for moderate constipation. 12 suppository 0   carbidopa -levodopa  (SINEMET  IR) 25-250 MG tablet TAKE 1 TABLET 4 TIMES A DAY 360 tablet 4   divalproex  (DEPAKOTE  SPRINKLE) 125 MG capsule Take 6 capsules (750 mg total) by mouth every 12 (twelve) hours. 60 capsule 2   levETIRAcetam  (KEPPRA ) 1000 MG tablet Take 1 tablet (1,000 mg total) by mouth 2 (two)  times daily. 180 tablet 1   losartan -hydrochlorothiazide (HYZAAR) 100-12.5 MG tablet Take 1 tablet by mouth daily.     metoprolol  succinate (TOPROL -XL) 25 MG 24 hr tablet Take 25 mg by mouth See admin instructions. Take 25 mg by mouth in the morning and hold for a Systolic reading less than 100 or heart rate lower than 60     polyethylene glycol (MIRALAX / GLYCOLAX) 17 g packet Take 17 g by mouth daily as needed. 14 each 0   risperiDONE  (RISPERDAL ) 1 MG tablet Take 1 tablet (1 mg total) by mouth at bedtime. 30 tablet 11   senna (SENOKOT) 8.6 MG TABS tablet Take 1 tablet (8.6 mg total) by mouth daily. 120 tablet 0   No current facility-administered medications for this visit.    PAST MEDICAL HISTORY: Past Medical History:  Diagnosis Date   Coronary artery disease    Dementia (HCC)    Depression    Heart disease    Hypertension    Hypothyroidism 01/04/2024   Memory loss    Seizures (HCC)    Stroke (HCC)    years ago (02/16/2015)    PAST SURGICAL HISTORY: Past Surgical History:  Procedure Laterality Date   CARDIAC CATHETERIZATION N/A 02/16/2015   Procedure: Left Heart Cath and Coronary Angiography;  Surgeon: Rober Chroman, MD;  Location[facility] INVASIVE  CV LAB;  Service: Cardiovascular;  Laterality: N/A;   CARDIAC CATHETERIZATION N/A 02/16/2015   Procedure: Coronary Stent Intervention;  Surgeon: Rober Chroman, MD;  Location: [facility] INVASIVE CV LAB;  Service: Cardiovascular;  Laterality: N/A;   CARDIAC CATHETERIZATION N/A 02/16/2015   Procedure: Intravascular Pressure Wire/FFR Study;  Surgeon: Rober Chroman, MD;  Location: [facility] INVASIVE CV LAB;  Service: Cardiovascular;  Laterality: N/A;   CORONARY ANGIOPLASTY     LEFT HEART CATH AND CORONARY ANGIOGRAPHY N/A 03/29/2017   Procedure: LEFT HEART CATH AND CORONARY ANGIOGRAPHY;  Surgeon: Chroman Rober, MD;  Location: [facility] INVASIVE CV LAB;  Service: Cardiovascular;  Laterality: N/A;   LEFT HEART CATHETERIZATION WITH CORONARY ANGIOGRAM N/A 03/19/2012    Procedure: LEFT HEART CATHETERIZATION WITH CORONARY ANGIOGRAM;  Surgeon: Rober LOISE Chroman, MD;  Location: [facility] CATH LAB;  Service: Cardiovascular;  Laterality: N/A;   WISDOM TOOTH EXTRACTION      FAMILY HISTORY: Family History  Problem Relation Age of Onset   Diabetes Mother    Hypertension Mother    Stroke Father    Cancer Sister     SOCIAL HISTORY: Social History   Socioeconomic History   Marital status: Divorced    Spouse name: Not on file   Number of children: 2   Years of education: 12   Highest education level: Not on file  Occupational History    Comment: retired naval architect  Tobacco Use   Smoking status: Former   Smokeless tobacco: Never   Tobacco comments:    quit many years ago  Substance and Sexual Activity   Alcohol use: Not Currently    Comment: 02/16/2015 last alcohol was in ~ 2012   Drug use: No   Sexual activity: Not Currently  Other Topics Concern   Not on file  Social History Narrative   Lives with daughter    caffeine -coffee,  1 cup daily   Social Drivers of Corporate Investment Banker Strain: Not on file  Food Insecurity: Not on file  Transportation Needs: Not on file  Physical Activity: Not on file  Stress: Not on file  Social Connections: Not on file  Intimate Partner Violence: Not on file      Modena Callander, M.D. Ph.D.  [facility] [address], [address] [place], [place] [address] Ph: [phone] Fax: [phone]  CC:  Regino Slater, MD [address] [place],  [place] [address]  No primary care provider on file.

## 2024-02-17 ENCOUNTER — Encounter (HOSPITAL_COMMUNITY): Payer: Self-pay

## 2024-02-17 ENCOUNTER — Emergency Department (HOSPITAL_COMMUNITY)

## 2024-02-17 ENCOUNTER — Other Ambulatory Visit: Payer: Self-pay

## 2024-02-17 ENCOUNTER — Emergency Department (HOSPITAL_COMMUNITY)
Admission: EM | Admit: 2024-02-17 | Discharge: 2024-02-17 | Disposition: A | Discharge status: Skilled Nursing Facility | Attending: Emergency Medicine | Admitting: Emergency Medicine

## 2024-02-17 DIAGNOSIS — G40909 Epilepsy, unspecified, not intractable, without status epilepticus: Secondary | ICD-10-CM | POA: Diagnosis not present

## 2024-02-17 DIAGNOSIS — R569 Unspecified convulsions: Secondary | ICD-10-CM

## 2024-02-17 DIAGNOSIS — Z7982 Long term (current) use of aspirin: Secondary | ICD-10-CM | POA: Diagnosis not present

## 2024-02-17 DIAGNOSIS — F0280 Dementia in other diseases classified elsewhere without behavioral disturbance: Secondary | ICD-10-CM | POA: Diagnosis not present

## 2024-02-17 DIAGNOSIS — G20A1 Parkinson's disease without dyskinesia, without mention of fluctuations: Secondary | ICD-10-CM | POA: Diagnosis not present

## 2024-02-17 DIAGNOSIS — R55 Syncope and collapse: Secondary | ICD-10-CM | POA: Diagnosis present

## 2024-02-17 LAB — URINALYSIS, ROUTINE W REFLEX MICROSCOPIC
Bilirubin Urine: NEGATIVE
Glucose, UA: NEGATIVE mg/dL
Hgb urine dipstick: NEGATIVE
Ketones, ur: NEGATIVE mg/dL
Leukocytes,Ua: NEGATIVE
Nitrite: NEGATIVE
Protein, ur: NEGATIVE mg/dL
Specific Gravity, Urine: 1.015 (ref 1.005–1.030)
pH: 7.5 (ref 5.0–8.0)

## 2024-02-17 LAB — COMPREHENSIVE METABOLIC PANEL WITH GFR
ALT: 6 U/L (ref 0–44)
AST: 14 U/L — ABNORMAL LOW (ref 15–41)
Albumin: 3.3 g/dL — ABNORMAL LOW (ref 3.5–5.0)
Alkaline Phosphatase: 55 U/L (ref 38–126)
Anion gap: 9 (ref 5–15)
BUN: 13 mg/dL (ref 8–23)
CO2: 27 mmol/L (ref 22–32)
Calcium: 8.6 mg/dL — ABNORMAL LOW (ref 8.9–10.3)
Chloride: 101 mmol/L (ref 98–111)
Creatinine, Ser: 1.2 mg/dL (ref 0.61–1.24)
GFR, Estimated: >60 mL/min (ref >60)
Glucose, Bld: 123 mg/dL — ABNORMAL HIGH (ref 70–99)
Potassium: 4.1 mmol/L (ref 3.5–5.1)
Sodium: 137 mmol/L (ref 135–145)
Total Bilirubin: 0.4 mg/dL (ref 0.0–1.2)
Total Protein: 6.2 g/dL — ABNORMAL LOW (ref 6.5–8.1)

## 2024-02-17 LAB — CBC
HCT: 43.5 % (ref 39.0–52.0)
Hemoglobin: 14 g/dL (ref 13.0–17.0)
MCH: 28.6 pg (ref 26.0–34.0)
MCHC: 32.2 g/dL (ref 30.0–36.0)
MCV: 89 fL (ref 80.0–100.0)
Platelets: 181 K/uL (ref 150–400)
RBC: 4.89 MIL/uL (ref 4.22–5.81)
RDW: 14.2 % (ref 11.5–15.5)
WBC: 6.6 K/uL (ref 4.0–10.5)
nRBC: 0 % (ref 0.0–0.2)

## 2024-02-17 LAB — VALPROIC ACID LEVEL: Valproic Acid Lvl: 96 ug/mL (ref 50–100)

## 2024-02-17 LAB — CBG MONITORING, ED: Glucose-Capillary: 120 mg/dL — ABNORMAL HIGH (ref 70–99)

## 2024-02-17 MED ORDER — DIVALPROEX SODIUM 125 MG PO CSDR: 1000.0000 mg | DELAYED_RELEASE_CAPSULE | Freq: Two times a day (BID) | ORAL | 2 refills | Status: AC

## 2024-02-17 NOTE — Discharge Instructions (Signed)
 You were seen for loss of consciousness in the emergency department.  You likely had a seizure  At home, please increase your Depakote  to 1000 mg twice a day.    Check your MyChart online for the results of any tests that had not resulted by the time you left the emergency department.   Follow-up with your primary doctor in 2-3 days regarding your visit.  Follow-up with your neurologist as soon as possible  Return immediately to the emergency department if you experience any of the following: Seizure lasting more than 5 minutes, back-to-back seizures without return to baseline, or any other concerning symptoms.    Do not drive for 6 months or until cleared by your neurologist  Thank you for visiting our Emergency Department. It was a pleasure taking care of you today.

## 2024-02-17 NOTE — ED Triage Notes (Signed)
 Pt bib [facility] coming from [facility]. Pt was reportedly sitting in dining room in a chair when his head suddenly fell back in chair with eyes rolled back for about 2-3 minutes. Facility called EMS for potential code stroke. Per EMS, patient is at his baseline (A&oX1). Pt complaining of headache.   EMS VS: 140/90 60 HR 161 cbg 96% RA

## 2024-02-17 NOTE — ED Provider Notes (Signed)
 [place] EMERGENCY DEPARTMENT AT [facility] Provider Note   CSN: [id number] Arrival date & time: 02/17/24  1145     Patient presents with: Near Syncope   Tyrone Noble is a 73 y.o. male.   73 yo M hx of dementia, parkinson's, and seizures on Depakote  and keppra  who presents with loss of consciousness. Per [facility] staff, he was sitting at the dining room table after breakfast and a resident noticed that [name] flung his head back and was staring at the ceiling for 2-3 minutes and didn't snap out of it. No convulsions. Then slumped forward and was drooling. Was unresponsive and non-verbal for ~15 minutes. Vital signs were normal. Started coming to slowly.   Currently takes Keppra  1000 mg twice daily and Depakote  750 mg twice daily       Prior to Admission medications   Medication Sig Start Date End Date Taking? Authorizing Provider  aspirin  81 MG chewable tablet Chew 1 tablet (81 mg total) by mouth daily. 02/17/15   Levern Hutching, MD  atorvastatin  (LIPITOR ) 20 MG tablet Take 20 mg by mouth daily.  05/08/17   [provider]  bisacodyl  (DULCOLAX) 10 MG suppository Place 1 suppository (10 mg total) rectally daily as needed for moderate constipation. 01/08/24   Akula, Vijaya, MD  carbidopa -levodopa  (SINEMET  IR) 25-250 MG tablet TAKE 1 TABLET 4 TIMES A DAY 05/17/23   Gayland Lauraine PARAS, NP  divalproex  (DEPAKOTE  SPRINKLE) 125 MG capsule Take 8 capsules (1,000 mg total) by mouth every 12 (twelve) hours. 02/17/24   [name], MD  levETIRAcetam  (KEPPRA ) 1000 MG tablet Take 1 tablet (1,000 mg total) by mouth 2 (two) times daily. 02/12/23   Gayland Lauraine PARAS, NP  losartan -hydrochlorothiazide (HYZAAR) 100-12.5 MG tablet Take 1 tablet by mouth daily. 12/07/23   [provider]  metoprolol  succinate (TOPROL -XL) 25 MG 24 hr tablet Take 25 mg by mouth See admin instructions. Take 25 mg by mouth in the morning and hold for a Systolic reading less than 100 or  heart rate lower than 60 11/19/15   [provider]  polyethylene glycol (MIRALAX  / GLYCOLAX ) 17 g packet Take 17 g by mouth daily as needed. 01/08/24   Akula, Vijaya, MD  risperiDONE  (RISPERDAL ) 1 MG tablet Take 1 tablet (1 mg total) by mouth at bedtime. 11/08/22   Gayland Lauraine PARAS, NP  senna (SENOKOT) 8.6 MG TABS tablet Take 1 tablet (8.6 mg total) by mouth daily. 01/08/24   Akula, Vijaya, MD    Allergies: Aricept  [donepezil  hcl] and Penicillins    Review of Systems  Updated Vital Signs BP (!) 146/66   Pulse 73   Temp (!) 97.2 F (36.2 C) (Axillary)   Resp 11   SpO2 100%   Physical Exam Constitutional:      Comments: Alert and oriented to self only.  Appears to be at baseline.  Is conversant.  Looking about the room and is not drowsy or postictal  Eyes:     Extraocular Movements: Extraocular movements intact.     Conjunctiva/sclera: Conjunctivae normal.     Pupils: Pupils are equal, round, and reactive to light.  Cardiovascular:     Rate and Rhythm: Normal rate and regular rhythm.  Pulmonary:     Effort: Pulmonary effort is normal.     Breath sounds: Normal breath sounds.  Musculoskeletal:     Cervical back: Normal range of motion and neck supple. No rigidity.  Neurological:     General: No  focal deficit present.     Mental Status: Mental status is at baseline.     Cranial Nerves: No cranial nerve deficit.     Sensory: No sensory deficit.     Motor: No weakness.     (all labs ordered are listed, but only abnormal results are displayed) Labs Reviewed  COMPREHENSIVE METABOLIC PANEL WITH GFR - Abnormal; Notable for the following components:      Result Value   Glucose, Bld 123 (*)    Calcium  8.6 (*)    Total Protein 6.2 (*)    Albumin 3.3 (*)    AST 14 (*)    All other components within normal limits  URINALYSIS, ROUTINE W REFLEX MICROSCOPIC - Abnormal; Notable for the following components:   APPearance HAZY (*)    All other components within normal limits   CBG MONITORING, ED - Abnormal; Notable for the following components:   Glucose-Capillary 120 (*)    All other components within normal limits  CBC  VALPROIC  ACID LEVEL    EKG: EKG Interpretation Date/Time:  Sunday February 17 2024 11:56:12 EST Ventricular Rate:  63 PR Interval:  197 QRS Duration:  152 QT Interval:  449 QTC Calculation: 460 R Axis:   -79  Text Interpretation: Sinus rhythm RBBB and LAFB Confirmed by Yolande Charleston [phone]) on 02/17/2024 12:06:28 PM  Radiology: CT Head Wo Contrast Result Date: 02/17/2024 CLINICAL DATA:  Loss of consciousness EXAM: CT HEAD WITHOUT CONTRAST TECHNIQUE: Contiguous axial images were obtained from the base of the skull through the vertex without intravenous contrast. RADIATION DOSE REDUCTION: This exam was performed according to the departmental dose-optimization program which includes automated exposure control, adjustment of the mA and/or kV according to patient size and/or use of iterative reconstruction technique. COMPARISON:  01/04/2024 FINDINGS: Brain: Age related atrophy. Chronic small-vessel ischemic change of the white matter. Old small vessel lacunar infarction of the right pons and of the thalami right more than left. No sign of acute infarction. Mass lesion, hemorrhage, hydrocephalus or extra-axial collection. Vascular: There is atherosclerotic calcification of the major vessels at the base of the brain. Skull: Negative Sinuses/Orbits: Clear/normal Other: None IMPRESSION: No acute CT finding. Age related atrophy. Chronic small-vessel ischemic changes of the white matter. Old small vessel lacunar infarctions of the right pons and of the thalami right more than left. Electronically Signed   By: Oneil Officer M.D.   On: 02/17/2024 13:59     Procedures   Medications Ordered in the ED - No data to display  Clinical Course as of 02/17/24 1447  Sun Feb 17, 2024  1318 Discussed with Dr Nichola recommends increasing depakote  to 1000 mg BID  and outpatient fu.  [RP]    Clinical Course User Index [RP] Yolande Charleston BROCKS, MD                                 Medical Decision Making Amount and/or Complexity of Data Reviewed Labs: ordered. Radiology: ordered.  Risk Prescription drug management.   Tyrone Noble is a 73 yo M hx of dementia, parkinson's, and seizures on Depakote  and keppra  who presents with loss of consciousness.   Initial Ddx:  Seizure, syncope, ICH, stroke  MDM/Course:  Patient presents emergency department for loss of consciousness.  Be confused afterwards which I suspect was a postictal state.  On exam peers to be at his mental baseline.  No focal neurologic deficits.  Is complaining  of a mild headache.  No meningismus.  CT head without acute abnormality.  EKG with sinus rhythm without concerning features such as Brugada, long QT, or WPW.  Chemistry and CBC unremarkable.  Urinalysis without signs of UTI.  Random valproic  acid level was sent but will take some time to return.  Discussed with on-call neurologist who recommended increasing his Depakote  to 1000 mg twice daily and outpatient follow-up since we suspect that he likely had a seizure.  Upon re-evaluation remained at his baseline.  Son-in-law was at the bedside and was informed of the plan.  Also informed of the incidental prior strokes that were found on his head CT which he says that the family is already aware of.  This patient presents to the ED for concern of complaints listed in HPI, this involves an extensive number of treatment options, and is a complaint that carries with it a high risk of complications and morbidity. Disposition including potential need for admission considered.   Dispo: DC Home. Return precautions discussed including, but not limited to, those listed in the AVS. Allowed pt time to ask questions which were answered fully prior to dc.  Additional history obtained from son-in-law Records reviewed Outpatient Clinic Notes The  following labs were independently interpreted: Chemistry and show no acute abnormality I independently reviewed the following imaging with scope of interpretation limited to determining acute life threatening conditions related to emergency care: CT Head and agree with the radiologist interpretation with the following exceptions: none I personally reviewed and interpreted cardiac monitoring: normal sinus rhythm  I personally reviewed and interpreted the pt's EKG: see above for interpretation  I have reviewed the patients home medications and made adjustments as needed Consults: Neurology Social Determinants of health:  SNF resident  Portions of this note were generated with Scientist, clinical (histocompatibility and immunogenetics). Dictation errors may occur despite best attempts at proofreading.     Final diagnoses:  Seizure-like activity (HCC)  Nonintractable epilepsy without status epilepticus, unspecified epilepsy type [facility])    ED Discharge Orders          Ordered    divalproex  (DEPAKOTE  SPRINKLE) 125 MG capsule  Every 12 hours        02/17/24 1424               [name], MD 02/17/24 1449

## 2024-04-07 ENCOUNTER — Inpatient Hospital Stay (HOSPITAL_COMMUNITY)
Admission: EM | Admit: 2024-04-07 | Discharge: 2024-04-10 | DRG: 057 | Disposition: A | Discharge status: Skilled Nursing Facility | Source: Skilled Nursing Facility | Attending: Internal Medicine | Admitting: Internal Medicine

## 2024-04-07 ENCOUNTER — Other Ambulatory Visit: Payer: Self-pay

## 2024-04-07 ENCOUNTER — Encounter (HOSPITAL_COMMUNITY): Payer: Self-pay | Admitting: Emergency Medicine

## 2024-04-07 DIAGNOSIS — I2510 Atherosclerotic heart disease of native coronary artery without angina pectoris: Secondary | ICD-10-CM | POA: Diagnosis present

## 2024-04-07 DIAGNOSIS — Z955 Presence of coronary angioplasty implant and graft: Secondary | ICD-10-CM

## 2024-04-07 DIAGNOSIS — G40909 Epilepsy, unspecified, not intractable, without status epilepticus: Secondary | ICD-10-CM | POA: Diagnosis present

## 2024-04-07 DIAGNOSIS — E039 Hypothyroidism, unspecified: Secondary | ICD-10-CM | POA: Diagnosis present

## 2024-04-07 DIAGNOSIS — Z888 Allergy status to other drugs, medicaments and biological substances status: Secondary | ICD-10-CM

## 2024-04-07 DIAGNOSIS — Z79899 Other long term (current) drug therapy: Secondary | ICD-10-CM

## 2024-04-07 DIAGNOSIS — Z880 Allergy status to penicillin: Secondary | ICD-10-CM

## 2024-04-07 DIAGNOSIS — R55 Syncope and collapse: Principal | ICD-10-CM | POA: Diagnosis present

## 2024-04-07 DIAGNOSIS — I10 Essential (primary) hypertension: Secondary | ICD-10-CM | POA: Diagnosis present

## 2024-04-07 DIAGNOSIS — I959 Hypotension, unspecified: Secondary | ICD-10-CM | POA: Diagnosis present

## 2024-04-07 DIAGNOSIS — G20C Parkinsonism, unspecified: Secondary | ICD-10-CM | POA: Diagnosis present

## 2024-04-07 DIAGNOSIS — Z8249 Family history of ischemic heart disease and other diseases of the circulatory system: Secondary | ICD-10-CM

## 2024-04-07 DIAGNOSIS — Z823 Family history of stroke: Secondary | ICD-10-CM

## 2024-04-07 DIAGNOSIS — Z833 Family history of diabetes mellitus: Secondary | ICD-10-CM

## 2024-04-07 DIAGNOSIS — Z7982 Long term (current) use of aspirin: Secondary | ICD-10-CM

## 2024-04-07 DIAGNOSIS — R001 Bradycardia, unspecified: Secondary | ICD-10-CM | POA: Diagnosis present

## 2024-04-07 DIAGNOSIS — I452 Bifascicular block: Secondary | ICD-10-CM | POA: Diagnosis present

## 2024-04-07 DIAGNOSIS — F0283 Dementia in other diseases classified elsewhere, unspecified severity, with mood disturbance: Secondary | ICD-10-CM | POA: Diagnosis present

## 2024-04-07 DIAGNOSIS — Z87891 Personal history of nicotine dependence: Secondary | ICD-10-CM

## 2024-04-07 DIAGNOSIS — E785 Hyperlipidemia, unspecified: Secondary | ICD-10-CM | POA: Diagnosis present

## 2024-04-07 DIAGNOSIS — Z8673 Personal history of transient ischemic attack (TIA), and cerebral infarction without residual deficits: Secondary | ICD-10-CM

## 2024-04-07 DIAGNOSIS — G20A1 Parkinson's disease without dyskinesia, without mention of fluctuations: Principal | ICD-10-CM | POA: Diagnosis present

## 2024-04-07 DIAGNOSIS — K5909 Other constipation: Secondary | ICD-10-CM | POA: Diagnosis present

## 2024-04-07 LAB — URINALYSIS, ROUTINE W REFLEX MICROSCOPIC
Bilirubin Urine: NEGATIVE
Glucose, UA: NEGATIVE mg/dL
Hgb urine dipstick: NEGATIVE
Ketones, ur: 5 mg/dL — AB
Nitrite: NEGATIVE
Protein, ur: NEGATIVE mg/dL
Specific Gravity, Urine: 1.009 (ref 1.005–1.030)
pH: 7 (ref 5.0–8.0)

## 2024-04-07 LAB — CBC
HCT: 44.2 % (ref 39.0–52.0)
Hemoglobin: 14.3 g/dL (ref 13.0–17.0)
MCH: 29.4 pg (ref 26.0–34.0)
MCHC: 32.4 g/dL (ref 30.0–36.0)
MCV: 90.8 fL (ref 80.0–100.0)
Platelets: 220 K/uL (ref 150–400)
RBC: 4.87 MIL/uL (ref 4.22–5.81)
RDW: 15.3 % (ref 11.5–15.5)
WBC: 8 K/uL (ref 4.0–10.5)
nRBC: 0 % (ref 0.0–0.2)

## 2024-04-07 LAB — COMPREHENSIVE METABOLIC PANEL WITH GFR
ALT: <5 U/L (ref 0–44)
AST: 20 U/L (ref 15–41)
Albumin: 3.9 g/dL (ref 3.5–5.0)
Alkaline Phosphatase: 76 U/L (ref 38–126)
Anion gap: 10 (ref 5–15)
BUN: 17 mg/dL (ref 8–23)
CO2: 28 mmol/L (ref 22–32)
Calcium: 9.2 mg/dL (ref 8.9–10.3)
Chloride: 103 mmol/L (ref 98–111)
Creatinine, Ser: 1.21 mg/dL (ref 0.61–1.24)
GFR, Estimated: >60 mL/min
Glucose, Bld: 95 mg/dL (ref 70–99)
Potassium: 4.6 mmol/L (ref 3.5–5.1)
Sodium: 141 mmol/L (ref 135–145)
Total Bilirubin: 0.3 mg/dL (ref 0.0–1.2)
Total Protein: 6.9 g/dL (ref 6.5–8.1)

## 2024-04-07 LAB — TROPONIN T, HIGH SENSITIVITY
Troponin T High Sensitivity: 24 ng/L — ABNORMAL HIGH (ref 0–19)
Troponin T High Sensitivity: <15 ng/L (ref 0–19)

## 2024-04-07 LAB — MAGNESIUM: Magnesium: 2.2 mg/dL (ref 1.7–2.4)

## 2024-04-07 LAB — TSH: TSH: 3.22 u[IU]/mL (ref 0.350–4.500)

## 2024-04-07 LAB — VALPROIC ACID LEVEL: Valproic Acid Lvl: 95 ug/mL (ref 50–100)

## 2024-04-07 MED ORDER — ATORVASTATIN CALCIUM 10 MG PO TABS
20.0000 mg | ORAL_TABLET | Freq: Every day | ORAL | Status: DC
  Administered 2024-04-08 – 2024-04-10 (×3): 20 mg via ORAL
  Filled 2024-04-07 (×3): qty 2

## 2024-04-07 MED ORDER — BISACODYL 10 MG RE SUPP: 10.0000 mg | Freq: Every day | RECTAL | Status: DC | PRN

## 2024-04-07 MED ORDER — POLYETHYLENE GLYCOL 3350 17 G PO PACK: 17.0000 g | PACK | Freq: Every day | ORAL | Status: DC | PRN

## 2024-04-07 MED ORDER — ACETAMINOPHEN 650 MG RE SUPP: 650.0000 mg | Freq: Four times a day (QID) | RECTAL | Status: DC | PRN

## 2024-04-07 MED ORDER — LEVETIRACETAM 500 MG PO TABS
1000.0000 mg | ORAL_TABLET | Freq: Two times a day (BID) | ORAL | Status: DC
  Administered 2024-04-08 – 2024-04-10 (×6): 1000 mg via ORAL
  Filled 2024-04-07 (×6): qty 2

## 2024-04-07 MED ORDER — SENNA 8.6 MG PO TABS
1.0000 | ORAL_TABLET | Freq: Every day | ORAL | Status: DC
  Administered 2024-04-08 – 2024-04-10 (×3): 8.6 mg via ORAL
  Filled 2024-04-07 (×4): qty 1

## 2024-04-07 MED ORDER — ACETAMINOPHEN 325 MG PO TABS
650.0000 mg | ORAL_TABLET | Freq: Four times a day (QID) | ORAL | Status: DC | PRN
  Administered 2024-04-09: 650 mg via ORAL
  Filled 2024-04-07: qty 2

## 2024-04-07 MED ORDER — RISPERIDONE 1 MG PO TABS
1.0000 mg | ORAL_TABLET | Freq: Every day | ORAL | Status: DC
  Administered 2024-04-08 – 2024-04-09 (×3): 1 mg via ORAL
  Filled 2024-04-07 (×4): qty 1

## 2024-04-07 MED ORDER — DIVALPROEX SODIUM 125 MG PO CSDR
1000.0000 mg | DELAYED_RELEASE_CAPSULE | Freq: Two times a day (BID) | ORAL | Status: DC
  Administered 2024-04-08 – 2024-04-10 (×6): 1000 mg via ORAL
  Filled 2024-04-07 (×6): qty 8

## 2024-04-07 MED ORDER — CARBIDOPA-LEVODOPA 25-250 MG PO TABS
1.0000 | ORAL_TABLET | Freq: Four times a day (QID) | ORAL | Status: DC
  Administered 2024-04-08 – 2024-04-10 (×11): 1 via ORAL
  Filled 2024-04-07 (×12): qty 1

## 2024-04-07 NOTE — ED Triage Notes (Signed)
 Patient with hx of dementia, Parkinson's and seizure disorder here for increasing number of syncopal episodes over the last several months. Today BP dropped to 80 systolic with HR in the 50s and bowel incontinence. Not post-ictal afterwards. A/O x 2 at baseline.

## 2024-04-07 NOTE — ED Provider Notes (Addendum)
 " [place] EMERGENCY DEPARTMENT AT [facility] Provider Note   CSN: [id number] Arrival date & time: 04/07/24  1443     Patient presents with: Loss of Consciousness   Tyrone Noble is a 74 y.o. male.   Patient is a 74 year old male with past medical history of Parkinson's, Parkinson's dementia, seizure disorder on Depakote  and Keppra , and hypothyroidism presenting for syncopal episode.  Per chart review patient was here on 02/17/2024 for episode of unresponsiveness while seated.  His workup and CT scan was stable.  He was recommended by neurology to increase his Depakote  dose.  Family reports on 03/18/2024 he had another syncopal episode.  Today he presents for syncopal episode that occurred this morning.  His daughter at the bedside states he was in the seated position when he slumped forward and became unresponsive for several seconds.  She states his blood pressure went from 140/100 to 75/42 and his HR went down to the 50s. At this time he had bowel incontinence. No shaking like episodes. No post-ictal state afterwards.   Baseline Aox2, follows commands, parkinsons tremor.   Denies hx of recent infection including no fevers, chills, nausea, vomiting, coughing, diarrhea, etc.   The history is provided by a relative (daughter). No language interpreter was used.  Loss of Consciousness Associated symptoms: no chest pain, no fever, no palpitations, no seizures, no shortness of breath and no vomiting        Prior to Admission medications  Medication Sig Start Date End Date Taking? Authorizing Provider  aspirin  81 MG chewable tablet Chew 1 tablet (81 mg total) by mouth daily. 02/17/15   Levern Hutching, MD  atorvastatin  (LIPITOR ) 20 MG tablet Take 20 mg by mouth daily.  05/08/17   [provider]  bisacodyl  (DULCOLAX) 10 MG suppository Place 1 suppository (10 mg total) rectally daily as needed for moderate constipation. 01/08/24   Akula, Vijaya, MD  carbidopa -levodopa   (SINEMET  IR) 25-250 MG tablet TAKE 1 TABLET 4 TIMES A DAY 05/17/23   Gayland Lauraine PARAS, NP  divalproex  (DEPAKOTE  SPRINKLE) 125 MG capsule Take 8 capsules (1,000 mg total) by mouth every 12 (twelve) hours. 02/17/24   [name], MD  levETIRAcetam  (KEPPRA ) 1000 MG tablet Take 1 tablet (1,000 mg total) by mouth 2 (two) times daily. 02/12/23   Gayland Lauraine PARAS, NP  losartan -hydrochlorothiazide (HYZAAR) 100-12.5 MG tablet Take 1 tablet by mouth daily. 12/07/23   [provider]  metoprolol  succinate (TOPROL -XL) 25 MG 24 hr tablet Take 25 mg by mouth See admin instructions. Take 25 mg by mouth in the morning and hold for a Systolic reading less than 100 or heart rate lower than 60 11/19/15   [provider]  polyethylene glycol (MIRALAX  / GLYCOLAX ) 17 g packet Take 17 g by mouth daily as needed. 01/08/24   Akula, Vijaya, MD  risperiDONE  (RISPERDAL ) 1 MG tablet Take 1 tablet (1 mg total) by mouth at bedtime. 11/08/22   Gayland Lauraine PARAS, NP  senna (SENOKOT) 8.6 MG TABS tablet Take 1 tablet (8.6 mg total) by mouth daily. 01/08/24   Akula, Vijaya, MD    Allergies: Aricept  [donepezil  hcl] and Penicillins    Review of Systems  Constitutional:  Negative for chills and fever.  HENT:  Negative for ear pain and sore throat.   Eyes:  Negative for pain and visual disturbance.  Respiratory:  Negative for cough and shortness of breath.   Cardiovascular:  Positive for syncope. Negative for chest pain and palpitations.  Gastrointestinal:  Negative for abdominal pain and vomiting.  Genitourinary:  Negative for dysuria and hematuria.  Musculoskeletal:  Negative for arthralgias and back pain.  Skin:  Negative for color change and rash.  Neurological:  Positive for syncope. Negative for seizures.  All other systems reviewed and are negative.   Updated Vital Signs BP (!) 150/88   Pulse 69   Temp 97.8 F (36.6 C)   Resp 15   SpO2 100%   Physical Exam  (all labs ordered are listed, but only  abnormal results are displayed) Labs Reviewed  URINALYSIS, ROUTINE W REFLEX MICROSCOPIC - Abnormal; Notable for the following components:      Result Value   Color, Urine STRAW (*)    APPearance HAZY (*)    Ketones, ur 5 (*)    Leukocytes,Ua LARGE (*)    Bacteria, UA RARE (*)    All other components within normal limits  TROPONIN T, HIGH SENSITIVITY - Abnormal; Notable for the following components:   Troponin T High Sensitivity 24 (*)    All other components within normal limits  COMPREHENSIVE METABOLIC PANEL WITH GFR  CBC  MAGNESIUM  TSH  VALPROIC  ACID LEVEL  CBG MONITORING, ED  TROPONIN T, HIGH SENSITIVITY    EKG: None  Radiology: No results found.   Procedures   Medications Ordered in the ED - No data to display                                  Medical Decision Making Amount and/or Complexity of Data Reviewed Labs: ordered.  Risk Decision regarding hospitalization.   74 year old male with past medical history of Parkinson's, Parkinson's dementia, seizure disorder on Depakote  and Keppra , and hypothyroidism presenting for syncopal episode.  Patient is alert and oriented x 2, quiet, moves all 4 extremities, baseline tremor secondary to Parkinson's disease, with no new neurovascular deficits.    Differential diagnosis includes but is not limited to breakthrough seizure, cardiogenic syncope, vasovagal syncope, etc.    Stable twelve-lead ECG without ST segment elevation or depression.  Initial troponin minimally elevated at 24.  No complaints of chest pain or shortness of breath.  Will repeat troponin.  Electrolytes are stable.  No gross dehydration.  Stable renal function.  My concerns for vasovagal syncope and/or cardiogenic syncope is higher than seizure disorder secondary to the drop in blood pressure and bradycardia that was noted by the family.  Throughout his stay in the emergency department today his blood pressure has been stable.  On arrival it was noted at  103/59.  Without intervention it has come up to the 140s over 80s.  His orthostatic vitals have been stable.  There are no other signs or symptoms to suggest infection, sepsis, or stroke.  Although patient did have bowel incontinence he was not postictal afterward and had no seizure-like activity such as shaking that would suggest that this is a seizure. I spoke with Dr. Michaela with agrees this is unlikely to be a seizure secondary to hypotension and bradycardia. Will see patient for recs. No antiepileptic medication changes at this time.  Patient's cardiologist Dr. Levern will see patient in the morning.  Recs to hold Toprol  XL.  Current blood pressure and heart has been stable throughout his stay today.  Blood pressure has not dropped below 60.   Patient accepted by admitting team.    Final diagnoses:  Syncope, unspecified syncope type  ED Discharge Orders     None          Elnor Bernarda SQUIBB, DO 04/07/24 2017    Elnor Bernarda SQUIBB, DO 04/07/24 2019  "

## 2024-04-07 NOTE — H&P (Signed)
 " History and Physical    ANEESH Noble FMW:[id number] DOB: November 27, 1950 DOA: 04/07/2024  PCP: Pcp, No  Patient coming from: Home  Chief Complaint: Syncope  HPI: Tyrone Noble is a 74 y.o. male with medical history significant of Parkinson's disease, dementia, seizure disorder, CAD status post PCI several years ago, hypertension, hypothyroidism, CVA, depression presenting to the ED for evaluation of syncope.  Patient was seen in the ED on 02/17/2024 for episode of unresponsiveness while seated.  His workup and head CT scan were stable.  Neurology felt that the patient likely had a seizure and recommended increasing his Depakote  dose to 1000 mg twice daily and outpatient follow-up.  Per ED documentation, family reported that on 12/30 he had another syncopal episode.  Today patient presents for witnessed syncopal episode that occurred this morning.  Family reported that patient was in seated position when he slumped forward and became unresponsive for several seconds.  They reported that his blood pressure went from 140/100 to 75/42 and his heart rate went down to the 50s.  He also had bowel incontinence but no shaking episodes and not postictal afterwards.  Baseline AAO x 2, follows commands, Parkinson's tremor.  Patient has dementia and not able to provide any history.  He is resting comfortably and has no complaints.  ED Course: Vital signs on arrival: Temperature 97.6 F, pulse 71, respiratory rate 14, blood pressure 103/59, and SpO2 99% on room air.  EKG showing normal sinus rhythm, RBBB, LAFB, and no acute ischemic changes.  CBC and CMP unremarkable.  Troponin 24, magnesium 2.2, TSH normal, valproic  acid level normal.  UA with negative nitrate, large amount of leukocytes, and microscopy showing 21-50 WBCs and rare bacteria.  EDP discussed the case with both cardiology (Dr. Levern) and neurology (Dr. Michaela).  Cardiology will see the patient in the morning and consider placing a loop  recorder.  Neurology felt that seizure was less likely given bradycardia and hypotension at the time of syncopal event at home.  They will consult.  Review of Systems:  Review of Systems  All other systems reviewed and are negative.   Past Medical History:  Diagnosis Date   Coronary artery disease    Dementia (HCC)    Depression    Heart disease    Hypertension    Hypothyroidism 01/04/2024   Memory loss    Seizures (HCC)    Stroke (HCC)    years ago (02/16/2015)    Past Surgical History:  Procedure Laterality Date   CARDIAC CATHETERIZATION N/A 02/16/2015   Procedure: Left Heart Cath and Coronary Angiography;  Surgeon: Rober Levern, MD;  Location: [facility] INVASIVE CV LAB;  Service: Cardiovascular;  Laterality: N/A;   CARDIAC CATHETERIZATION N/A 02/16/2015   Procedure: Coronary Stent Intervention;  Surgeon: Rober Levern, MD;  Location: [facility] INVASIVE CV LAB;  Service: Cardiovascular;  Laterality: N/A;   CARDIAC CATHETERIZATION N/A 02/16/2015   Procedure: Intravascular Pressure Wire/FFR Study;  Surgeon: Rober Levern, MD;  Location: [facility] INVASIVE CV LAB;  Service: Cardiovascular;  Laterality: N/A;   CORONARY ANGIOPLASTY     LEFT HEART CATH AND CORONARY ANGIOGRAPHY N/A 03/29/2017   Procedure: LEFT HEART CATH AND CORONARY ANGIOGRAPHY;  Surgeon: Levern Rober, MD;  Location: [facility] INVASIVE CV LAB;  Service: Cardiovascular;  Laterality: N/A;   LEFT HEART CATHETERIZATION WITH CORONARY ANGIOGRAM N/A 03/19/2012   Procedure: LEFT HEART CATHETERIZATION WITH CORONARY ANGIOGRAM;  Surgeon: Rober LOISE Levern, MD;  Location: [facility] CATH LAB;  Service: Cardiovascular;  Laterality: N/A;  WISDOM TOOTH EXTRACTION       reports that he has quit smoking. He has never used smokeless tobacco. He reports that he does not currently use alcohol. He reports that he does not use drugs.  Allergies[1]  Family History  Problem Relation Age of Onset   Diabetes Mother    Hypertension Mother    Stroke Father    Cancer  Sister     Prior to Admission medications  Medication Sig Start Date End Date Taking? Authorizing Provider  levETIRAcetam  (KEPPRA ) 100 MG/ML solution SMARTSIG:Milliliter(s) By Mouth 03/26/24  Yes [provider]  aspirin  81 MG chewable tablet Chew 1 tablet (81 mg total) by mouth daily. 02/17/15   Levern Hutching, MD  atorvastatin  (LIPITOR ) 20 MG tablet Take 20 mg by mouth daily.  05/08/17   [provider]  bisacodyl  (DULCOLAX) 10 MG suppository Place 1 suppository (10 mg total) rectally daily as needed for moderate constipation. 01/08/24   Akula, Vijaya, MD  carbidopa -levodopa  (SINEMET  IR) 25-250 MG tablet TAKE 1 TABLET 4 TIMES A DAY 05/17/23   Gayland Lauraine PARAS, NP  divalproex  (DEPAKOTE  SPRINKLE) 125 MG capsule Take 8 capsules (1,000 mg total) by mouth every 12 (twelve) hours. 02/17/24   [name], MD  levETIRAcetam  (KEPPRA ) 1000 MG tablet Take 1 tablet (1,000 mg total) by mouth 2 (two) times daily. 02/12/23   Gayland Lauraine PARAS, NP  losartan -hydrochlorothiazide (HYZAAR) 100-12.5 MG tablet Take 1 tablet by mouth daily. 12/07/23   [provider]  metoprolol  succinate (TOPROL -XL) 25 MG 24 hr tablet Take 25 mg by mouth See admin instructions. Take 25 mg by mouth in the morning and hold for a Systolic reading less than 100 or heart rate lower than 60 11/19/15   [provider]  polyethylene glycol (MIRALAX  / GLYCOLAX ) 17 g packet Take 17 g by mouth daily as needed. 01/08/24   Akula, Vijaya, MD  risperiDONE  (RISPERDAL ) 1 MG tablet Take 1 tablet (1 mg total) by mouth at bedtime. 11/08/22   Gayland Lauraine PARAS, NP  senna (SENOKOT) 8.6 MG TABS tablet Take 1 tablet (8.6 mg total) by mouth daily. 01/08/24   Cherlyn Labella, MD    Physical Exam: Vitals:   04/07/24 1830 04/07/24 1845 04/07/24 1900 04/07/24 1928  BP: (!) 148/108 (!) 147/86 (!) 150/88   Pulse: 70 71 69   Resp: 13 15 15    Temp:    97.8 F (36.6 C)  TempSrc:      SpO2: 100% 100% 100%     Physical Exam Vitals  reviewed.  Constitutional:      General: He is not in acute distress. HENT:     Head: Normocephalic and atraumatic.  Eyes:     Extraocular Movements: Extraocular movements intact.  Cardiovascular:     Rate and Rhythm: Normal rate and regular rhythm.     Pulses: Normal pulses.  Pulmonary:     Effort: Pulmonary effort is normal. No respiratory distress.     Breath sounds: Normal breath sounds. No wheezing or rales.  Abdominal:     General: Bowel sounds are normal.     Palpations: Abdomen is soft.     Tenderness: There is no abdominal tenderness. There is no guarding.  Musculoskeletal:     Cervical back: Normal range of motion.     Right lower leg: No edema.     Left lower leg: No edema.  Skin:    General: Skin is warm and dry.  Neurological:     General:  No focal deficit present.     Mental Status: He is alert.     Cranial Nerves: No cranial nerve deficit.     Sensory: No sensory deficit.     Motor: No weakness.     Comments: Oriented to self and knows that he is at [facility] on Admission: I have personally reviewed following labs and imaging studies  CBC: Recent Labs  Lab 04/07/24 1645  WBC 8.0  HGB 14.3  HCT 44.2  MCV 90.8  PLT 220   Basic Metabolic Panel: Recent Labs  Lab 04/07/24 1645  NA 141  K 4.6  CL 103  CO2 28  GLUCOSE 95  BUN 17  CREATININE 1.21  CALCIUM  9.2  MG 2.2   GFR: CrCl cannot be calculated (Unknown ideal weight.). Liver Function Tests: Recent Labs  Lab 04/07/24 1645  AST 20  ALT <5  ALKPHOS 76  BILITOT 0.3  PROT 6.9  ALBUMIN 3.9   No results for input(s): LIPASE, AMYLASE in the last 168 hours. No results for input(s): AMMONIA in the last 168 hours. Coagulation Profile: No results for input(s): INR, PROTIME in the last 168 hours. Cardiac Enzymes: No results for input(s): CKTOTAL, CKMB, CKMBINDEX, TROPONINI in the last 168 hours. BNP (last 3 results) No results for input(s): PROBNP in  the last 8760 hours. HbA1C: No results for input(s): HGBA1C in the last 72 hours. CBG: No results for input(s): GLUCAP in the last 168 hours. Lipid Profile: No results for input(s): CHOL, HDL, LDLCALC, TRIG, CHOLHDL, LDLDIRECT in the last 72 hours. Thyroid  Function Tests: Recent Labs    04/07/24 1645  TSH 3.220   Anemia Panel: No results for input(s): VITAMINB12, FOLATE, FERRITIN, TIBC, IRON, RETICCTPCT in the last 72 hours. Urine analysis:    Component Value Date/Time   COLORURINE STRAW (A) 04/07/2024 1818   APPEARANCEUR HAZY (A) 04/07/2024 1818   LABSPEC 1.009 04/07/2024 1818   PHURINE 7.0 04/07/2024 1818   GLUCOSEU NEGATIVE 04/07/2024 1818   HGBUR NEGATIVE 04/07/2024 1818   BILIRUBINUR NEGATIVE 04/07/2024 1818   KETONESUR 5 (A) 04/07/2024 1818   PROTEINUR NEGATIVE 04/07/2024 1818   UROBILINOGEN 1.0 03/17/2012 1615   NITRITE NEGATIVE 04/07/2024 1818   LEUKOCYTESUR LARGE (A) 04/07/2024 1818    Radiological Exams on Admission: No results found.  Assessment and Plan  Multiple syncopal events over the past few months Per ED documentation, family reported that patient was in seated position when he slumped forward and became unresponsive for several seconds.  They reported that his blood pressure went from 140/100 to 75/42 and his heart rate went down to the 50s.  He also had bowel incontinence but no shaking episodes and not postictal afterwards.  Patient's vital signs have remained stable in the ED and not hypotensive.  EKG showing normal sinus rhythm, RBBB, LAFB, and no acute ischemic changes.  Initial troponin 24 and repeat <15, not consistent with ACS.  Not anemic or hypoglycemic.  No focal neurodeficit on exam and no falls or head injury reported.  Cardiology (Dr. Levern) will consult in the morning and consider placing a loop recorder.  Continue cardiac monitoring.  Hold antihypertensives at this time and check orthostatics.  EDP discussed with  case with neurologist who felt that seizure was less likely.  Neurology will consult.  ?UTI UA with negative nitrate, large amount of leukocytes, and microscopy showing 21-50 WBCs and rare bacteria.  No fever or leukocytosis.  No urinary symptoms reported.  Will hold off starting antibiotics at this time and follow-up urine culture.  Parkinson's disease Dementia Mood disorder Continue Sinemet  and risperidone .  Delirium precautions.  Seizure disorder Valproic  acid level normal.  Continue Depakote  and Keppra .  Hyperlipidemia Continue Lipitor .  Chronic constipation Continue home meds.  DVT prophylaxis: SCDs Code Status: Full code by default.  Patient does not have capacity for decision-making, no surrogate or prior directive available. Family Communication: No family available at this time. Level of care: Progressive Care Unit Admission status: It is my clinical opinion that referral for OBSERVATION is reasonable and necessary in this patient based on the above information provided. The aforementioned taken together are felt to place the patient at high risk for further clinical deterioration. However, it is anticipated that the patient may be medically stable for discharge from the hospital within 24 to 48 hours.  Editha Ram MD [facility]  If 7PM-7AM, please contact night-coverage [email or web address]  04/07/2024, 9:32 PM       [1]  Allergies Allergen Reactions   Aricept  [Donepezil  Hcl] Nausea Only   Penicillins Nausea And Vomiting   "

## 2024-04-07 NOTE — Consult Note (Signed)
 NEUROLOGY CONSULT NOTE   Date of service: April 07, 2024 Patient Name: Tyrone Noble MRN:  [id number] DOB:  [birth year] Chief Complaint: unresponsiveness Requesting Provider: Elnor Bernarda SQUIBB, DO  History of Present Illness  Tyrone Noble is a 74 y.o. male with hx of stroke, dementia, seizure disorder is presenting with episodes of loss of consciousness.  He had an episode in October where he presented with confusion and suspected unwitnessed seizure.  He then had an episode in late November similar to tonight's episode where he just passed out.  Tonight, he collapsed without seizure activity and was incontinent of bowels.  His daughter measured his blood pressure and found it to be in the 70s systolic, with bradycardia.  Past History   Past Medical History:  Diagnosis Date   Coronary artery disease    Dementia (HCC)    Depression    Heart disease    Hypertension    Hypothyroidism 01/04/2024   Memory loss    Seizures (HCC)    Stroke (HCC)    years ago (02/16/2015)    Past Surgical History:  Procedure Laterality Date   CARDIAC CATHETERIZATION N/A 02/16/2015   Procedure: Left Heart Cath and Coronary Angiography;  Surgeon: Rober Chroman, MD;  Location: [facility] INVASIVE CV LAB;  Service: Cardiovascular;  Laterality: N/A;   CARDIAC CATHETERIZATION N/A 02/16/2015   Procedure: Coronary Stent Intervention;  Surgeon: Rober Chroman, MD;  Location: [facility] INVASIVE CV LAB;  Service: Cardiovascular;  Laterality: N/A;   CARDIAC CATHETERIZATION N/A 02/16/2015   Procedure: Intravascular Pressure Wire/FFR Study;  Surgeon: Rober Chroman, MD;  Location: [facility] INVASIVE CV LAB;  Service: Cardiovascular;  Laterality: N/A;   CORONARY ANGIOPLASTY     LEFT HEART CATH AND CORONARY ANGIOGRAPHY N/A 03/29/2017   Procedure: LEFT HEART CATH AND CORONARY ANGIOGRAPHY;  Surgeon: Chroman Rober, MD;  Location: [facility] INVASIVE CV LAB;  Service: Cardiovascular;  Laterality: N/A;   LEFT HEART CATHETERIZATION WITH  CORONARY ANGIOGRAM N/A 03/19/2012   Procedure: LEFT HEART CATHETERIZATION WITH CORONARY ANGIOGRAM;  Surgeon: Rober LOISE Chroman, MD;  Location: [facility] CATH LAB;  Service: Cardiovascular;  Laterality: N/A;   WISDOM TOOTH EXTRACTION      Family History: Family History  Problem Relation Age of Onset   Diabetes Mother    Hypertension Mother    Stroke Father    Cancer Sister     Social History  reports that he has quit smoking. He has never used smokeless tobacco. He reports that he does not currently use alcohol. He reports that he does not use drugs.  Allergies[1]  Medications  Current Medications[2]  Vitals   Vitals:   04/07/24 1830 04/07/24 1845 04/07/24 1900 04/07/24 1928  BP: (!) 148/108 (!) 147/86 (!) 150/88   Pulse: 70 71 69   Resp: 13 15 15    Temp:    97.8 F (36.6 C)  TempSrc:      SpO2: 100% 100% 100%     There is no height or weight on file to calculate BMI.   Physical Exam   Constitutional: Appears well-developed and well-nourished.   Neurologic Examination    Neuro: Mental Status: Patient is awake, alert, oriented to person only Cranial Nerves: II: Visual Fields are full. Pupils are equal, round, and reactive to light.   III,IV, VI: EOMI without ptosis or diploplia.  V: Facial sensation is symmetric to touch VII: Facial movement is symmetric.  VIII: hearing is intact to voice X: Uvula elevates symmetrically XII: tongue is midline without atrophy or  fasciculations.  Motor: Tone is normal. Bulk is normal. 5/5 strength was present in all four extremities.  Sensory: Sensation is symmetric to light touch  Cerebellar: No clear ataxia  Labs/Imaging/Neurodiagnostic studies   CBC:  Recent Labs  Lab 05-01-2024 1645  WBC 8.0  HGB 14.3  HCT 44.2  MCV 90.8  PLT 220   Basic Metabolic Panel:  Lab Results  Component Value Date   NA 141 May 01, 2024   K 4.6 05/01/24   CO2 28 05-01-2024   GLUCOSE 95 2024/05/01   BUN 17 May 01, 2024   CREATININE 1.21  May 01, 2024   CALCIUM  9.2 2024-05-01   GFRNONAA >60 05/01/2024   GFRAA 77 01/29/2020   Lipid Panel:  Lab Results  Component Value Date   LDLCALC 105 (H) 03/18/2012   HgbA1c:  Lab Results  Component Value Date   HGBA1C 5.6 02/16/2016   Urine Drug Screen:     Component Value Date/Time   LABOPIA NONE DETECTED 01/04/2024 2119   COCAINSCRNUR NONE DETECTED 01/04/2024 2119   COCAINSCRNUR NEGATIVE 10/16/2007 1430   LABBENZ NONE DETECTED 01/04/2024 2119   LABBENZ NEGATIVE 10/16/2007 1430   AMPHETMU NONE DETECTED 01/04/2024 2119   THCU NONE DETECTED 01/04/2024 2119   LABBARB NONE DETECTED 01/04/2024 2119    Alcohol Level     Component Value Date/Time   [facility] <15 01/04/2024 1837   INR  Lab Results  Component Value Date   INR 1.1 01/04/2024   APTT  Lab Results  Component Value Date   APTT 26 01/04/2024   AED levels:  Lab Results  Component Value Date   LEVETIRACETA 28.9 01/04/2024    ASSESSMENT   Tyrone Noble is a 74 y.o. male with recurrent episodes of unresponsiveness.  He has had convulsive seizures in the past, but the current episode of unresponsiveness coupled with hypotension and bradycardia and lack of postictal state would argue against seizure as the etiology for his current presentation.  I would continue his current antiepileptics and workup for causes of bradycardia/hypotension.  I do not think an EEG would be helpful in further elucidating the etiology of this event.  RECOMMENDATIONS  Continue home Depakote  1 g twice daily and Keppra  1 g twice daily Neurology will be available as needed, please call if further questions or concerns. ______________________________________________________________________    Bonney Aisha Seals, MD [facility]     [1]  Allergies Allergen Reactions   Aricept  [Donepezil  Hcl] Nausea Only   Penicillins Nausea And Vomiting  [2] No current facility-administered medications for this  encounter.  Current Outpatient Medications:    levETIRAcetam  (KEPPRA ) 100 MG/ML solution, SMARTSIG:Milliliter(s) By Mouth, Disp: , Rfl:    aspirin  81 MG chewable tablet, Chew 1 tablet (81 mg total) by mouth daily., Disp: 30 tablet, Rfl: 3   atorvastatin  (LIPITOR ) 20 MG tablet, Take 20 mg by mouth daily. , Disp: , Rfl: 5   bisacodyl  (DULCOLAX) 10 MG suppository, Place 1 suppository (10 mg total) rectally daily as needed for moderate constipation., Disp: 12 suppository, Rfl: 0   carbidopa -levodopa  (SINEMET  IR) 25-250 MG tablet, TAKE 1 TABLET 4 TIMES A DAY, Disp: 360 tablet, Rfl: 4   divalproex  (DEPAKOTE  SPRINKLE) 125 MG capsule, Take 8 capsules (1,000 mg total) by mouth every 12 (twelve) hours., Disp: 60 capsule, Rfl: 2   levETIRAcetam  (KEPPRA ) 1000 MG tablet, Take 1 tablet (1,000 mg total) by mouth 2 (two) times daily., Disp: 180 tablet, Rfl: 1   losartan -hydrochlorothiazide (HYZAAR) 100-12.5 MG tablet, Take 1 tablet by mouth daily.,  Disp: , Rfl:    metoprolol  succinate (TOPROL -XL) 25 MG 24 hr tablet, Take 25 mg by mouth See admin instructions. Take 25 mg by mouth in the morning and hold for a Systolic reading less than 100 or heart rate lower than 60, Disp: , Rfl:    polyethylene glycol (MIRALAX  / GLYCOLAX ) 17 g packet, Take 17 g by mouth daily as needed., Disp: 14 each, Rfl: 0   risperiDONE  (RISPERDAL ) 1 MG tablet, Take 1 tablet (1 mg total) by mouth at bedtime., Disp: 30 tablet, Rfl: 11   senna (SENOKOT) 8.6 MG TABS tablet, Take 1 tablet (8.6 mg total) by mouth daily., Disp: 120 tablet, Rfl: 0

## 2024-04-07 NOTE — ED Notes (Signed)
 Called and placed PT on monitor with CCMD

## 2024-04-08 DIAGNOSIS — R001 Bradycardia, unspecified: Secondary | ICD-10-CM | POA: Diagnosis present

## 2024-04-08 DIAGNOSIS — Z8673 Personal history of transient ischemic attack (TIA), and cerebral infarction without residual deficits: Secondary | ICD-10-CM | POA: Diagnosis not present

## 2024-04-08 DIAGNOSIS — R55 Syncope and collapse: Secondary | ICD-10-CM | POA: Diagnosis present

## 2024-04-08 DIAGNOSIS — I959 Hypotension, unspecified: Secondary | ICD-10-CM | POA: Diagnosis present

## 2024-04-08 DIAGNOSIS — G40909 Epilepsy, unspecified, not intractable, without status epilepticus: Secondary | ICD-10-CM | POA: Diagnosis present

## 2024-04-08 DIAGNOSIS — G20A1 Parkinson's disease without dyskinesia, without mention of fluctuations: Secondary | ICD-10-CM | POA: Diagnosis present

## 2024-04-08 DIAGNOSIS — Z955 Presence of coronary angioplasty implant and graft: Secondary | ICD-10-CM | POA: Diagnosis not present

## 2024-04-08 DIAGNOSIS — F0283 Dementia in other diseases classified elsewhere, unspecified severity, with mood disturbance: Secondary | ICD-10-CM | POA: Diagnosis present

## 2024-04-08 DIAGNOSIS — Z87891 Personal history of nicotine dependence: Secondary | ICD-10-CM | POA: Diagnosis not present

## 2024-04-08 DIAGNOSIS — Z79899 Other long term (current) drug therapy: Secondary | ICD-10-CM | POA: Diagnosis not present

## 2024-04-08 DIAGNOSIS — Z888 Allergy status to other drugs, medicaments and biological substances status: Secondary | ICD-10-CM | POA: Diagnosis not present

## 2024-04-08 DIAGNOSIS — I2510 Atherosclerotic heart disease of native coronary artery without angina pectoris: Secondary | ICD-10-CM | POA: Diagnosis present

## 2024-04-08 DIAGNOSIS — E039 Hypothyroidism, unspecified: Secondary | ICD-10-CM | POA: Diagnosis present

## 2024-04-08 DIAGNOSIS — K5909 Other constipation: Secondary | ICD-10-CM | POA: Diagnosis present

## 2024-04-08 DIAGNOSIS — Z823 Family history of stroke: Secondary | ICD-10-CM | POA: Diagnosis not present

## 2024-04-08 DIAGNOSIS — Z833 Family history of diabetes mellitus: Secondary | ICD-10-CM | POA: Diagnosis not present

## 2024-04-08 DIAGNOSIS — I10 Essential (primary) hypertension: Secondary | ICD-10-CM | POA: Diagnosis present

## 2024-04-08 DIAGNOSIS — Z8249 Family history of ischemic heart disease and other diseases of the circulatory system: Secondary | ICD-10-CM | POA: Diagnosis not present

## 2024-04-08 DIAGNOSIS — E785 Hyperlipidemia, unspecified: Secondary | ICD-10-CM | POA: Diagnosis present

## 2024-04-08 DIAGNOSIS — Z7982 Long term (current) use of aspirin: Secondary | ICD-10-CM | POA: Diagnosis not present

## 2024-04-08 DIAGNOSIS — Z880 Allergy status to penicillin: Secondary | ICD-10-CM | POA: Diagnosis not present

## 2024-04-08 DIAGNOSIS — I452 Bifascicular block: Secondary | ICD-10-CM | POA: Diagnosis present

## 2024-04-08 LAB — CBG MONITORING, ED: Glucose-Capillary: 89 mg/dL (ref 70–99)

## 2024-04-08 NOTE — ED Notes (Signed)
 Pt noted to have removed EKG/vital sign monitoring equipment, gown, condom cath, and peed on floor at end on bed. Pt found laying on stretcher with sheets/bedpads removed. Pt cleaned and placed back in bed.

## 2024-04-08 NOTE — Plan of Care (Signed)

## 2024-04-08 NOTE — ED Notes (Signed)
 Pt resting with eyes closed, respirations spontaneous, even, and unlabored.

## 2024-04-08 NOTE — Progress Notes (Signed)
" ° °  Brief Progress Note   _____________________________________________________________________________________________________________  Patient Name: Tyrone Noble Patient DOB: 26-Sep-1950 Date: @TODAY @      Data: Reviewed labs, notes, VS.    Action: No action needed at this time.     Response:    _____________________________________________________________________________________________________________  [facility] RN Expeditor Sharolyn JONETTA Batman Please contact us  directly via secure chat (search for [facility]) or by calling us  at [phone] [facility]).  "

## 2024-04-08 NOTE — Hospital Course (Signed)
"   Tyrone Noble is a 74 y.o. male with medical history significant of Parkinson's disease, dementia, seizure disorder, CAD status post PCI, hypertension, hypothyroidism, CVA, depression presented to the hospital with syncope on 02/16/25. His workup and head CT scan were stable.  Neurology felt that the patient likely had a seizure and recommended increasing his Depakote  dose to 1000 mg twice daily and outpatient follow-up. Further on 12/30 he had another syncopal episode.  Today patient presents for witnessed syncopal episode, he was in seated position when he slumped forward and became unresponsive for several seconds.  They reported that his blood pressure went from 140/100 to 75/42 and his heart rate went down to the 50s.  He also had bowel incontinence but no shaking episodes and not postictal afterwards.  Baseline AAO x 2, follows commands, Parkinson's tremor.  Patient has dementia and unable to provide much information.  In the ED patient had stable vitals. EKG showing normal sinus rhythm, RBBB, LAFB, and no acute ischemic changes.  CBC and CMP unremarkable.  Troponin 24, magnesium 2.2, TSH normal, valproic  acid level normal.  UA with negative nitrate, large amount of leukocytes, and microscopy showing 21-50 WBCs and rare bacteria.  Neurology and cardiology were consulted from the ED and patient was admitted to hospital for further evaluation and treatment.    Recurrent syncopal events  Syncope this time presented with hypotension and bradycardia with incontinence.  Had multiple syncope in the past.  EKG unremarkable.  Troponin 24 followed by 15.  No focal neurological deficits.  Cardiology has been consulted and plan for loop recorder placement.  Hold antihypertensives.  Neurologist had an impression that seizure was less likely.  Currently n.p.o. for possible intervention.  Asymptomatic bacteriuria UA with negative nitrate, large amount of leukocytes, and microscopy showing 21-50 WBCs and rare  bacteria.  No fever or leukocytosis.  No urinary symptoms reported.  Will hold off starting antibiotics at this time and follow-up urine culture.   Parkinson's disease Dementia Mood disorder Continue Sinemet  and risperidone .  Delirium precautions.   Seizure disorder Valproic  acid level normal.  Continue Depakote  and Keppra .   Hyperlipidemia Continue Lipitor .   Chronic constipation Continue MiraLAX  and Dulcolax.   "

## 2024-04-08 NOTE — Progress Notes (Signed)
 " PROGRESS NOTE  Tyrone Noble FMW:[id number] DOB: 1950/04/07 DOA: 04/07/2024 PCP: Pcp, No   LOS: 0 days   Brief narrative:   Tyrone Noble is a 74 y.o. male with medical history significant of Parkinson's disease, dementia, seizure disorder, CAD status post PCI, hypertension, hypothyroidism, CVA, depression presented to the hospital with syncope on 02/16/25. His workup and head CT scan were stable.  Neurology felt that the patient likely had a seizure and recommended increasing his Depakote  dose to 1000 mg twice daily and outpatient follow-up. Further on 12/30 he had another syncopal episode.  Today patient presents for witnessed syncopal episode, he was in seated position when he slumped forward and became unresponsive for several seconds.  They reported that his blood pressure went from 140/100 to 75/42 and his heart rate went down to the 50s.  He also had bowel incontinence but no shaking episodes and not postictal afterwards.  Baseline AAO x 2, follows commands, Parkinson's tremor.  Patient has dementia and unable to provide much information.  In the ED patient had stable vitals. EKG showing normal sinus rhythm, RBBB, LAFB, and no acute ischemic changes.  CBC and CMP unremarkable.  Troponin 24, magnesium 2.2, TSH normal, valproic  acid level normal.  UA with negative nitrate, large amount of leukocytes, and microscopy showing 21-50 WBCs and rare bacteria.  Neurology and cardiology were consulted from the ED and patient was admitted to hospital for further evaluation and treatment.      Assessment/Plan: Principal Problem:   Syncope Active Problems:   HLD (hyperlipidemia)   Parkinsonism (HCC)   Seizure disorder (HCC)  Recurrent syncopal events  Syncope this time presented with hypotension and bradycardia with incontinence.  Had multiple syncope in the past.  EKG unremarkable.  Troponin 24 followed by 15.  No focal neurological deficits.  Cardiology has been consulted and plan for loop  recorder placement.  Hold antihypertensives.  Neurologist had an impression that seizure was less likely.  Currently n.p.o. for possible intervention.  Asymptomatic bacteriuria UA with negative nitrate, large amount of leukocytes, and microscopy showing 21-50 WBCs and rare bacteria.  No fever or leukocytosis.  No urinary symptoms reported.  Will hold off starting antibiotics at this time and follow-up urine culture.   Parkinson's disease Dementia Mood disorder Continue Sinemet  and risperidone .  Delirium precautions.   Seizure disorder Valproic  acid level normal.  Continue Depakote  and Keppra .   Hyperlipidemia Continue Lipitor .   Chronic constipation Continue MiraLAX  and Dulcolax. DVT prophylaxis: SCDs Start: 04/07/24 2210   Disposition: Uncertain at this time  Status is: Observation The patient will require care spanning > 2 midnights and should be moved to inpatient because: Pending clinical improvement, need for intervention    Code Status:     Code Status: Full Code  Family Communication: Spoke with the patient's son-in-law at bedside  Consultants: Neurology, cardiology  Procedures: None yet  Anti-infectives:  None  Anti-infectives (From admission, onward)    None        Subjective: Today, patient was seen and examined at bedside.  Poor historian.  Appears to be sleepy.  Patient's family at bedside.  Denies interval complaints overnight.  Objective: Vitals:   04/08/24 1100 04/08/24 1112  BP: (!) 123/97   Pulse: 70   Resp: 15   Temp:  98.2 F (36.8 C)  SpO2: 100%    No intake or output data in the 24 hours ending 04/08/24 1122 Filed Weights   04/08/24 0058  Weight: 72.6 kg  Body mass index is 27.47 kg/m.   Physical Exam:  GENERAL: Patient is mildly somnolent, not in obvious distress. HENT: No scleral pallor or icterus. Pupils equally reactive to light. Oral mucosa is moist NECK: is supple, no gross swelling noted. CHEST: Diminished breath  sounds bilaterally CVS: S1 and S2 heard, no murmur. Regular rate and rhythm.  ABDOMEN: Soft, non-tender, bowel sounds are present. EXTREMITIES: No edema. CNS: Moves all extremities SKIN: warm and dry without rashes.  Data Review: I have personally reviewed the following laboratory data and studies,  CBC: Recent Labs  Lab 04/07/24 1645  WBC 8.0  HGB 14.3  HCT 44.2  MCV 90.8  PLT 220   Basic Metabolic Panel: Recent Labs  Lab 04/07/24 1645  NA 141  K 4.6  CL 103  CO2 28  GLUCOSE 95  BUN 17  CREATININE 1.21  CALCIUM  9.2  MG 2.2   Liver Function Tests: Recent Labs  Lab 04/07/24 1645  AST 20  ALT <5  ALKPHOS 76  BILITOT 0.3  PROT 6.9  ALBUMIN 3.9   No results for input(s): LIPASE, AMYLASE in the last 168 hours. No results for input(s): AMMONIA in the last 168 hours. Cardiac Enzymes: No results for input(s): CKTOTAL, CKMB, CKMBINDEX, TROPONINI in the last 168 hours. BNP (last 3 results) No results for input(s): BNP in the last 8760 hours.  ProBNP (last 3 results) No results for input(s): PROBNP in the last 8760 hours.  CBG: Recent Labs  Lab 04/08/24 0017  GLUCAP 89   No results found for this or any previous visit (from the past 240 hours).   Studies: No results found.    Kitana Gage, MD  [facility] 04/08/2024  If 7PM-7AM, please contact night-coverage             "

## 2024-04-09 ENCOUNTER — Inpatient Hospital Stay (HOSPITAL_COMMUNITY)

## 2024-04-09 DIAGNOSIS — R55 Syncope and collapse: Secondary | ICD-10-CM

## 2024-04-09 LAB — URINE CULTURE: Culture: <10000 — AB

## 2024-04-09 LAB — ECHOCARDIOGRAM COMPLETE
Area-P 1/2: 3.27 cm2
Height: 64 in
S' Lateral: 3.1 cm
Single Plane A4C EF: 61.5 %
Weight: 2560.86 [oz_av]

## 2024-04-09 MED ORDER — SODIUM CHLORIDE 0.9 % IV SOLN: INTRAVENOUS | Status: DC

## 2024-04-09 NOTE — Progress Notes (Signed)
 Echocardiogram 2D Echocardiogram has been performed.  Tyrone Noble 04/09/2024, 3:23 PM

## 2024-04-09 NOTE — Consult Note (Cosign Needed Addendum)
 "  Cardiology Consultation   Patient ID: Tyrone Noble MRN: [id number]; DOB: 1950/11/22  Admit date: 04/07/2024 Date of Consult: 04/09/2024  PCP:  Freddrick Johns   [facility] Providers Cardiologist:  Dr. Levern   Patient Profile: Tyrone Noble is a 74 y.o. male with a hx of  Parkinson's disease, dementia, seizure d/o HTN, HLD, hypothyroidism stroke CAD (remote PCI) RBBB   who is being seen 04/09/2024 for consideration of loop implant for recurrent syncope at the request of Dr. Tobie  History of Present Illness: Tyrone Noble in review of the chart of late has had recurrent syncope  In review of H&P -- was seen in the ED on 02/17/2024 for episode of unresponsiveness while seated.  His workup and head CT scan were stable.  Neurology felt that the patient likely had a seizure and recommended increasing his Depakote  dose  -- family reported that on 12/30 he had another syncopal episode  -- This admission presents for witnessed syncopal episode that occurred this morning.  Family reported that patient was in seated position when he slumped forward and became unresponsive for several seconds.  They reported that his blood pressure went from 140/100 to 75/42 and his heart rate went down to the 50s.   At the time of admission, team apparently discussed with Dr. Levern and understood at least that he was going to see the patient and consider placing a loop  Labs largely unremarkable, abnormal UA, though not suspected of active infection Neurology felt less likely seizure and likely 2/2 hypotension and or bradycardia reported  EP is asked to see today, after further d/w Dr. Levern, no plans to see, recommended EP to see for consideration of loop  LABS K+ 4.6 Mag 2.2 BUN/Creat 17/1.21 HS Trop 24, <15 WBC 8 H/h 14/44 Plts 220  The patient is resting comfortably, easily woken Oriented to self Very pleasant, follows commands, unable to provide any history  I spek with  his daughter Tyrone Noble The patient lives at [facility]. She recounts as above In Oct had an episode of staring off, repetitive speech and felt to have been a seizure Nov 30 ER with episode that was again thought to be a seizure (record reported: sitting at the dining room table after breakfast and a resident noticed that Tyrone Noble flung his head back and was staring at the ceiling for 2-3 minutes and didn't snap out of it. No convulsions. Then slumped forward and was drooling. Was unresponsive and non-verbal for ~15 minutes. Vital signs were normal  Dec 30 (or 31st) had a fainting episode (there is what looks an EMS EKG in his chart dated 12/31 that is SR 67bpm) This admission As reported above Vitals taken by NH staff  Today in d/w attending  When he stood for orthostatics his HR jumped to the 120's-130 range and BPs not obtained    Past Medical History:  Diagnosis Date   Coronary artery disease    Dementia (HCC)    Depression    Heart disease    Hypertension    Hypothyroidism 01/04/2024   Memory loss    Seizures (HCC)    Stroke (HCC)    years ago (02/16/2015)    Past Surgical History:  Procedure Laterality Date   CARDIAC CATHETERIZATION N/A 02/16/2015   Procedure: Left Heart Cath and Coronary Angiography;  Surgeon: Rober Levern, MD;  Location: [facility] INVASIVE CV LAB;  Service: Cardiovascular;  Laterality: N/A;   CARDIAC CATHETERIZATION N/A  02/16/2015   Procedure: Coronary Stent Intervention;  Surgeon: Rober Chroman, MD;  Location: [facility] INVASIVE CV LAB;  Service: Cardiovascular;  Laterality: N/A;   CARDIAC CATHETERIZATION N/A 02/16/2015   Procedure: Intravascular Pressure Wire/FFR Study;  Surgeon: Rober Chroman, MD;  Location: [facility] INVASIVE CV LAB;  Service: Cardiovascular;  Laterality: N/A;   CORONARY ANGIOPLASTY     LEFT HEART CATH AND CORONARY ANGIOGRAPHY N/A 03/29/2017   Procedure: LEFT HEART CATH AND CORONARY ANGIOGRAPHY;  Surgeon: Chroman Rober, MD;   Location: [facility] INVASIVE CV LAB;  Service: Cardiovascular;  Laterality: N/A;   LEFT HEART CATHETERIZATION WITH CORONARY ANGIOGRAM N/A 03/19/2012   Procedure: LEFT HEART CATHETERIZATION WITH CORONARY ANGIOGRAM;  Surgeon: Rober LOISE Chroman, MD;  Location: [facility] CATH LAB;  Service: Cardiovascular;  Laterality: N/A;   WISDOM TOOTH EXTRACTION       Home Medications:  Prior to Admission medications  Medication Sig Start Date End Date Taking? Authorizing Provider  aspirin  81 MG chewable tablet Chew 1 tablet (81 mg total) by mouth daily. 02/17/15  Yes Chroman Rober, MD  atorvastatin  (LIPITOR ) 20 MG tablet Take 20 mg by mouth at bedtime. 05/08/17  Yes [provider]  bisacodyl  (DULCOLAX) 10 MG suppository Place 1 suppository (10 mg total) rectally daily as needed for moderate constipation. 01/08/24  Yes Akula, Vijaya, MD  carbidopa -levodopa  (SINEMET  IR) 25-250 MG tablet TAKE 1 TABLET 4 TIMES A DAY 05/17/23  Yes Gayland Lauraine PARAS, NP  divalproex  (DEPAKOTE  SPRINKLE) 125 MG capsule Take 8 capsules (1,000 mg total) by mouth every 12 (twelve) hours. Patient taking differently: Take 500-1,000 mg by mouth See admin instructions. Give 1,000mg  in the morning and 500mg  in the evening per [facility]. 02/17/24  Yes [name], MD  levETIRAcetam  (KEPPRA ) 100 MG/ML solution Take 1,000 mg by mouth 2 (two) times daily.   Yes [provider]  losartan -hydrochlorothiazide (HYZAAR) 100-12.5 MG tablet Take 1 tablet by mouth daily. Hold medication if BP is less than 130/54mmhg 12/07/23  Yes [provider]  metoprolol  succinate (TOPROL -XL) 25 MG 24 hr tablet Take 25 mg by mouth daily. Take 25 mg by mouth in the morning and hold for a Systolic reading less than 100 or heart rate lower than 60 11/19/15  Yes [provider]  polyethylene glycol (MIRALAX  / GLYCOLAX ) 17 g packet Take 17 g by mouth daily as needed. Patient taking differently: Take 17 g by mouth daily as needed for mild constipation. 01/08/24   Yes Cherlyn Labella, MD  risperiDONE  (RISPERDAL ) 1 MG tablet Take 1 tablet (1 mg total) by mouth at bedtime. 11/08/22  Yes Gayland Lauraine PARAS, NP  senna (SENOKOT) 8.6 MG TABS tablet Take 1 tablet (8.6 mg total) by mouth daily. 01/08/24  Yes Akula, Vijaya, MD    Scheduled Meds:  atorvastatin   20 mg Oral Daily   carbidopa -levodopa   1 tablet Oral QID   divalproex   1,000 mg Oral Q12H   levETIRAcetam   1,000 mg Oral BID   risperiDONE   1 mg Oral QHS   senna  1 tablet Oral Daily   Continuous Infusions:  PRN Meds: acetaminophen  **OR** acetaminophen , bisacodyl , polyethylene glycol  Allergies:   Allergies[1]  Social History:   Social History   Socioeconomic History   Marital status: Divorced    Spouse name: Not on file   Number of children: 2   Years of education: 12   Highest education level: Not on file  Occupational History    Comment: retired naval architect  Tobacco Use   Smoking  status: Former   Smokeless tobacco: Never   Tobacco comments:    quit many years ago  Substance and Sexual Activity   Alcohol use: Not Currently    Comment: 02/16/2015 last alcohol was in ~ 2012   Drug use: No   Sexual activity: Not Currently  Other Topics Concern   Not on file  Social History Narrative   Lives with daughter    caffeine -coffee,  1 cup daily   Social Drivers of Health   Tobacco Use: Medium Risk (04/07/2024)   Patient History    Smoking Tobacco Use: Former    Smokeless Tobacco Use: Never    Passive Exposure: Not on Actuary Strain: Not on file  Food Insecurity: Patient Declined (04/08/2024)   Epic    Worried About Programme Researcher, Broadcasting/film/video in the Last Year: Patient declined    Barista in the Last Year: Patient declined  Transportation Needs: Patient Unable To Answer (04/08/2024)   Epic    Lack of Transportation (Medical): Patient unable to answer    Lack of Transportation (Non-Medical): Patient unable to answer  Physical Activity: Not on file  Stress: Not on  file  Social Connections: Unknown (04/08/2024)   Social Connection and Isolation Panel    Frequency of Communication with Friends and Family: Patient unable to answer    Frequency of Social Gatherings with Friends and Family: Patient unable to answer    Attends Religious Services: Patient declined    Active Member of Clubs or Organizations: Patient unable to answer    Attends Banker Meetings: Patient unable to answer    Marital Status: Patient unable to answer  Intimate Partner Violence: Not At Risk (04/08/2024)   Epic    Fear of Current or Ex-Partner: No    Emotionally Abused: No    Physically Abused: No    Sexually Abused: No  Depression (PHQ2-9): Not on file  Alcohol Screen: Not on file  Housing: Unknown (04/08/2024)   Epic    Unable to Pay for Housing in the Last Year: Patient declined    Number of Times Moved in the Last Year: 1    Homeless in the Last Year: Patient declined  Utilities: Patient Unable To Answer (04/08/2024)   Epic    Threatened with loss of utilities: Patient unable to answer  Health Literacy: Not on file    Family History:   Family History  Problem Relation Age of Onset   Diabetes Mother    Hypertension Mother    Stroke Father    Cancer Sister      ROS:  Please see the history of present illness.  All other ROS reviewed and negative.     Physical Exam/Data: Vitals:   04/09/24 0357 04/09/24 0749 04/09/24 1040 04/09/24 1100  BP:  113/70 118/70   Pulse:  65 88   Resp:  16 18   Temp: 98.2 F (36.8 C) 97.9 F (36.6 C)  98 F (36.7 C)  TempSrc: Oral Oral  Oral  SpO2:  97% 95%   Weight:      Height:        Intake/Output Summary (Last 24 hours) at 04/09/2024 1257 Last data filed at 04/09/2024 0008 Gross per 24 hour  Intake --  Output 600 ml  Net -600 ml      04/08/2024   12:58 AM 01/04/2024    4:44 PM 07/17/2023    2:23 PM  Last 3 Weights  Weight (lbs)  160 lb 0.9 oz 160 lb 166 lb  Weight (kg) 72.6 kg 72.576 kg 75.297 kg      Body mass index is 27.47 kg/m.  General:  Well nourished, well developed, in no acute distress HEENT: normal Neck: no JVD Vascular: No carotid bruits; Distal pulses 2+ bilaterally Cardiac:  RRR; no murmurs, gallops or rubs Lungs:  CTA b/l, no wheezing, rhonchi or rales  Abd: soft, nontender Ext: no edema Musculoskeletal:  No deformities Skin: warm and dry  Neuro: AAO to self only, follows commands, no gross focal abnormalities noted Psych:  pleasant and cooperative  EKG:  The EKG was personally reviewed and demonstrates:    SR, 63bpm, RBBB, LAD SR 91bpm, RBBB, LAD   Old 02/04/23 SR 68bpm, RBBB, LAD  Telemetry:  Telemetry was personally reviewed and demonstrates:   SR 60's-70's, ST towards 130s today (apparently when getting him up/out of bed, settled back down nicely  Relevant CV Studies:  03/29/2017: LHC Prox RCA lesion is 15% stenosed. Ost 1st Diag lesion is 40% stenosed. The left ventricular systolic function is normal. LV end diastolic pressure is normal. Previously placed Prox LAD to Mid LAD stent (unknown type) is widely patent.    Laboratory Data: High Sensitivity Troponin:  No results for input(s): TROPONINIHS in the last 720 hours.  Recent Labs  Lab 04/07/24 1645 04/07/24 2119  TRNPT 24* <15      Chemistry Recent Labs  Lab 04/07/24 1645  NA 141  K 4.6  CL 103  CO2 28  GLUCOSE 95  BUN 17  CREATININE 1.21  CALCIUM  9.2  MG 2.2  GFRNONAA >60  ANIONGAP 10    Recent Labs  Lab 04/07/24 1645  PROT 6.9  ALBUMIN 3.9  AST 20  ALT <5  ALKPHOS 76  BILITOT 0.3   Lipids No results for input(s): CHOL, TRIG, HDL, LABVLDL, LDLCALC, CHOLHDL in the last 168 hours.  Hematology Recent Labs  Lab 04/07/24 1645  WBC 8.0  RBC 4.87  HGB 14.3  HCT 44.2  MCV 90.8  MCH 29.4  MCHC 32.4  RDW 15.3  PLT 220   Thyroid   Recent Labs  Lab 04/07/24 1645  TSH 3.220    BNPNo results for input(s): BNP, PROBNP in the last 168 hours.   DDimer No results for input(s): DDIMER in the last 168 hours.  Radiology/Studies:  No results found.   Assessment and Plan:  Recurrent syncope Occurred while seated Clear BP change reported in chart (obtained by NH staff) Reports of HR in the 50's He has not had any bradycardia here, in fact with minimal exertion his HR rose sharply (no BP obtained) He does have baseline conduction system disease w/RBBB and LAD   Will go ahead and order an echo I think that given 2 episode in the last 3 weeks a 30 day monitor (this would need to be mailed)  is reasonable to try and start with Or home with a live zio 2 weeks from here and follow up with Dr. Levern I mentioned this to his daughter  I suspect this is more of a BP problem given his Parkinson's Consider allowing a higher BP for him  I will review with EP MD, will see him once able for final recommendations   ADEND: Dr. Inocencio has been bedside, reviewed the case. Agrees, given recent frequency of episodes, syncope, reasonable to 1st proceed with 30 day live-monitoring prior to implantable . I will send this note/recommendations to Dr. Levern to order monitor  and follow up I have discussed with his daughter Tyrone Noble, she will follow up with Dr. Talitha office as well to ensure he has gotten the message and ordering the monitor and follow up with his office   For questions or updates, please contact [facility] Please consult [email or web address] for contact info under   Signed, Charlies Macario Arthur, PA-C  04/09/2024 12:57 PM  I have seen and examined this patient with Charlies Arthur.  Agree with above, note added to reflect my findings.  Patient with a past history as above.  He has had recurrent episodes of syncope.  He was seen in the emergency room 02/17/2024 with an episode of unresponsiveness while seated.  Workup was stable.  Neurology felt like he potentially had a seizure and his Depakote  dose was adjusted.  On 03/18/2024,  he had another syncopal episode.  He had a witnessed episode that occurred in the morning.  The patient was in a seated position and slumped forward.  He was unresponsive for several seconds.  The patient has dementia.  He is oriented to person.  He does not remember the event.  GEN: No acute distress.   Neck: No JVD Cardiac: RRR, no murmurs, rubs, or gallops.  Respiratory: normal BS bases bilaterally. GI: Soft, nontender, non-distended  MS: No edema; No deformity. Neuro:  Nonfocal  Skin: warm and dry Psych: Flat affect   Recurrent syncope: Patient has Parkinson's.  He also has a history of seizures.  This could be playing a part in his episodes of syncope.  He has not had any bradycardia while in the hospital.  He does have conduction system disease with right bundle branch block, left anterior fascicular block.  As he has had multiple episodes of syncope that has been close in time, he would likely benefit from a 30-day monitor.  This can be arranged through his primary cardiologist.  If he has any further EP needs, would be happy to see him back in the future.  Will M. Camnitz MD 04/10/2024 7:34 AM      [1]  Allergies Allergen Reactions   Aricept  [Donepezil  Hcl] Nausea Only   Penicillins Nausea And Vomiting   "

## 2024-04-09 NOTE — Plan of Care (Signed)

## 2024-04-09 NOTE — TOC Initial Note (Signed)
 Transition of Care [facility]) - Initial/Assessment Note    Patient Details  Name: Tyrone Noble MRN: [id number] Date of Birth: 14-Aug-1950  Transition of Care [facility]) CM/SW Contact:    Inocente GORMAN Kindle, LCSW Phone Number: 04/09/2024, 11:35 AM  Clinical Narrative:                 CSW confirmed patient was admitted from [facility] where he resides under long term care. Per facility, patient is able to return today if needed.    Expected Discharge Plan: Skilled Nursing Facility Barriers to Discharge: Continued Medical Work up   Patient Goals and CMS Choice Patient states their goals for this hospitalization and ongoing recovery are:: Return to snf          Expected Discharge Plan and Services In-house Referral: Clinical Social Work   Post Acute Care Choice: Skilled Nursing Facility Living arrangements for the past 2 months: Skilled Nursing Facility                                      Prior Living Arrangements/Services Living arrangements for the past 2 months: Skilled Nursing Facility Lives with:: Facility Resident Patient language and need for interpreter reviewed:: Yes Do you feel safe going back to the place where you live?: Yes      Need for Family Participation in Patient Care: Yes (Comment) Care giver support system in place?: Yes (comment)   Criminal Activity/Legal Involvement Pertinent to Current Situation/Hospitalization: No - Comment as needed  Activities of Daily Living   ADL Screening (condition at time of admission) Independently performs ADLs?: No Does the patient have a NEW difficulty with bathing/dressing/toileting/self-feeding that is expected to last >3 days?: Yes (Initiates electronic notice to provider for possible OT consult) Does the patient have a NEW difficulty with getting in/out of bed, walking, or climbing stairs that is expected to last >3 days?: Yes (Initiates electronic notice to provider for possible PT consult) Does the patient  have a NEW difficulty with communication that is expected to last >3 days?: No Is the patient deaf or have difficulty hearing?: No Does the patient have difficulty seeing, even when wearing glasses/contacts?: No Does the patient have difficulty concentrating, remembering, or making decisions?: Yes  Permission Sought/Granted Permission sought to share information with : Facility Medical Sales Representative, Family Supports Permission granted to share information with : No  Share Information with NAME: Trudy Gains- Daughter [phone]  Permission granted to share info w AGENCY: [facility]        Emotional Assessment Appearance:: Appears stated age Attitude/Demeanor/Rapport: Unable to Assess Affect (typically observed): Unable to Assess Orientation: : Oriented to Self Alcohol / Substance Use: Not Applicable Psych Involvement: No (comment)  Admission diagnosis:  Syncope [R55] Syncope, unspecified syncope type [R55] Patient Active Problem List   Diagnosis Date Noted   Syncope 04/07/2024   Gait abnormality 01/14/2024   Severe late onset Alzheimer's dementia with mood disturbance (HCC) 01/14/2024   Hypothyroidism 01/04/2024   Lactic acidosis 01/04/2024   COVID-19 06/15/2022   Acute encephalopathy 02/22/2021   Failure to thrive in adult    COVID-19 virus infection 03/19/2020   Generalized weakness 03/19/2020   Essential hypertension 03/19/2020   Seizure disorder (HCC) 03/19/2020   Parkinsonism (HCC) 02/26/2018   Therapeutic drug monitoring 09/12/2017   Seizures (HCC) 05/31/2017   Dementia without behavioral disturbance (HCC) 05/31/2017   New-onset angina 02/16/2015   HLD (hyperlipidemia)  12/13/2009   ALCOHOLISM 07/07/2009   CALLUS, TOE 01/03/2007   DEPRESSION, MAJOR, RECURRENT 05/17/2006   TOBACCO DEPENDENCE 05/17/2006   HYPERTENSION, BENIGN SYSTEMIC 05/17/2006   HEMORRHOIDS, NOS 05/17/2006   GASTROESOPHAGEAL REFLUX, NO ESOPHAGITIS 05/17/2006   LEG PAIN OR KNEE PAIN  05/17/2006   PCP:  Freddrick No Pharmacy:   [facility] [place] - [address] [place] [address] Phone: [phone] Fax: [phone]     Social Drivers of Health (SDOH) Social History: SDOH Screenings   Food Insecurity: Patient Declined (04/08/2024)  Housing: Unknown (04/08/2024)  Transportation Needs: Patient Unable To Answer (04/08/2024)  Utilities: Patient Unable To Answer (04/08/2024)  Social Connections: Unknown (04/08/2024)  Tobacco Use: Medium Risk (04/07/2024)   SDOH Interventions:     Readmission Risk Interventions     No data to display

## 2024-04-09 NOTE — Progress Notes (Signed)
 [facility] Progress Note Patient: Tyrone Noble FMW:[id number] DOB: [birth year]  DOA: 04/07/2024 DOS: the patient was seen and examined on 04/09/2024  Brief Summary: Patient with PMH of Parkinson disease, dementia, seizures, CAD SP PCI, HTN, hypothyroidism, CVA, depression presented to the hospital with complaints of syncope. Reportedly his blood pressure was 140s systolic and dropped down to 60s or 70s systolic.  EMS found the patient with normal blood pressure.  Significant events: 02/17/2024 seen in ED with unresponsiveness. 03/18/2024 reported another episode of syncope. 1/19 admit to [facility].  Neurology was consulted. 1/21 EP consulted.  Recommend 30-day event monitor.  Echocardiogram performed EF 55 to 60%.  1 missed beat noted on telemetry.  Positive orthostatics.  Consults: Electrophysiology Neurology   Assessment and Plan: Recurrent syncopal events. Bradycardia with cardiac arrhythmias. Appreciate EP consultation. At present recommendation is for consideration of 30-day event monitor. Patient was admitted for observation and reportedly was to be seen by cardiology for ILR placement. Echocardiogram performed as a part of workup, shows preserved EF without any significant wall motion or valvular abnormality.  Parkinson's disease. Dementia. Mood disorder. Known history of Parkinson's, appears to be significantly advanced. On Sinemet  and risperidone . His presentation with recurrent syncope as well as bradycardia and tachycardia events as well as high and low blood pressure could be consistent with autonomic dysfunction related to Parkinson's disease. For now monitor.  Seizure disorder. Seen by neurology at the time of the admission. Recommending to continue Depakote  1 g twice daily and Keppra  1 g twice daily. So far no seizures seen here.  History of hypertension. Hypotension. Presented with significantly low blood pressure reportedly by nursing home staff. Blood  pressure here has been stable after holding home medication. Appears to be on losartan  HCTZ Eliquis and metoprolol  on home. Would recommend to discontinue this medication going forward.  Orthostasis. Staff reported that while they were trying to stand him up his heart rate jumped significantly and patient was reporting to having some dizziness although no blood pressure was checked. At present we will provide IV hydration and monitor.  Chronic constipation. Continue home medication.  HLD. Lipitor .  Disposition. Patient is from memory care unit. Anticipating overnight improvement in orthostasis with plan discharge back to memory care unit on 1/22.   Code Status: Full Code   DVT Prophylaxis: SCDs Start: 04/07/24 2210    Data review I have Reviewed nursing notes, Vitals, and Lab results. Since last encounter, pertinent lab results CBC and BMP   . I have discussed pt's care plan and test results with electrophysiologist  .  Ordered EKG and reviewed.  No significant ST-T wave changes.  No significant arrhythmia. On telemetry noted to have 1 instance of P wave not conducting.  Family Communication: Family at bedside.  Disposition Plan: Status is: Inpatient   Planned Discharge Destintion: Back to memory care unit. Diet: Diet Order             DIET DYS 3 Room service appropriate? No; Fluid consistency: Thin  Diet effective now                   MEDICATIONS: Scheduled Meds:  atorvastatin   20 mg Oral Daily   carbidopa -levodopa   1 tablet Oral QID   divalproex   1,000 mg Oral Q12H   levETIRAcetam   1,000 mg Oral BID   risperiDONE   1 mg Oral QHS   senna  1 tablet Oral Daily   Continuous Infusions: PRN Meds:.acetaminophen  **OR** acetaminophen , bisacodyl , polyethylene glycol  Author: Lorrin Nawrot  Tobie, MD  [facility] 04/09/2024  5:28 PM Between 7PM-7AM, please contact night-coverage, check [email or web address] for on call.

## 2024-04-09 NOTE — Progress Notes (Signed)
 I have sent our consult note and note (CCd chart) to Dr. Levern, to make him aware of recommendations, for monitoring and follow up with him.  Charlies Arthur, PA-C

## 2024-04-09 NOTE — NC FL2 (Signed)
 " [place]  MEDICAID FL2 LEVEL OF CARE FORM     IDENTIFICATION  Patient Name: Tyrone Noble Birthdate: 31-Jul-1950 Sex: male Admission Date (Current Location): 04/07/2024  [facility] and [place] Number:  Producer, Television/film/video and Address:  [facility], [address], [place], [place] [address]      Provider Number: [id number]  Attending Physician Name and Address:  Tobie Yetta HERO, MD  Relative Name and Phone Number:       Current Level of Care: Hospital Recommended Level of Care: Skilled Nursing Facility Prior Approval Number:    Date Approved/Denied:   PASRR Number: [id number]  Discharge Plan: SNF    Current Diagnoses: Patient Active Problem List   Diagnosis Date Noted   Syncope 04/07/2024   Gait abnormality 01/14/2024   Severe late onset Alzheimer's dementia with mood disturbance (HCC) 01/14/2024   Hypothyroidism 01/04/2024   Lactic acidosis 01/04/2024   COVID-19 06/15/2022   Acute encephalopathy 02/22/2021   Failure to thrive in adult    COVID-19 virus infection 03/19/2020   Generalized weakness 03/19/2020   Essential hypertension 03/19/2020   Seizure disorder (HCC) 03/19/2020   Parkinsonism (HCC) 02/26/2018   Therapeutic drug monitoring 09/12/2017   Seizures (HCC) 05/31/2017   Dementia without behavioral disturbance (HCC) 05/31/2017   New-onset angina 02/16/2015   HLD (hyperlipidemia) 12/13/2009   ALCOHOLISM 07/07/2009   CALLUS, TOE 01/03/2007   DEPRESSION, MAJOR, RECURRENT 05/17/2006   TOBACCO DEPENDENCE 05/17/2006   HYPERTENSION, BENIGN SYSTEMIC 05/17/2006   HEMORRHOIDS, NOS 05/17/2006   GASTROESOPHAGEAL REFLUX, NO ESOPHAGITIS 05/17/2006   LEG PAIN OR KNEE PAIN 05/17/2006    Orientation RESPIRATION BLADDER Height & Weight     Self  Normal Incontinent, External catheter Weight: 160 lb 0.9 oz (72.6 kg) Height:  5' 4 (162.6 cm)  BEHAVIORAL SYMPTOMS/MOOD NEUROLOGICAL BOWEL NUTRITION STATUS      Continent Diet (See dc  summary)  AMBULATORY STATUS COMMUNICATION OF NEEDS Skin   Extensive Assist Verbally Normal                       Personal Care Assistance Level of Assistance  Bathing, Feeding, Dressing Bathing Assistance: Maximum assistance Feeding assistance: Maximum assistance Dressing Assistance: Maximum assistance     Functional Limitations Info             SPECIAL CARE FACTORS FREQUENCY                       Contractures Contractures Info: Not present    Additional Factors Info  Code Status, Allergies, Psychotropic Code Status Info: Full Allergies Info: Aricept  (Donepezil  Hcl), Penicillins Psychotropic Info: Risperdal          Current Medications (04/09/2024):  This is the current hospital active medication list Current Facility-Administered Medications  Medication Dose Route Frequency Provider Last Rate Last Admin   acetaminophen  (TYLENOL ) tablet 650 mg  650 mg Oral Q6H PRN Alfornia Madison, MD       Or   acetaminophen  (TYLENOL ) suppository 650 mg  650 mg Rectal Q6H PRN Alfornia Madison, MD       atorvastatin  (LIPITOR ) tablet 20 mg  20 mg Oral Daily Rathore, Vasundhra, MD   20 mg at 04/09/24 1050   bisacodyl  (DULCOLAX) suppository 10 mg  10 mg Rectal Daily PRN Alfornia Madison, MD       carbidopa -levodopa  (SINEMET  IR) 25-250 MG per tablet immediate release 1 tablet  1 tablet Oral QID Alfornia Madison, MD  1 tablet at 04/09/24 1050   divalproex  (DEPAKOTE  SPRINKLE) capsule 1,000 mg  1,000 mg Oral Q12H Alfornia Madison, MD   1,000 mg at 04/08/24 2252   levETIRAcetam  (KEPPRA ) tablet 1,000 mg  1,000 mg Oral BID Rathore, Vasundhra, MD   1,000 mg at 04/09/24 1050   polyethylene glycol (MIRALAX  / GLYCOLAX ) packet 17 g  17 g Oral Daily PRN Rathore, Vasundhra, MD       risperiDONE  (RISPERDAL ) tablet 1 mg  1 mg Oral QHS Rathore, Vasundhra, MD   1 mg at 04/08/24 2248   senna (SENOKOT) tablet 8.6 mg  1 tablet Oral Daily Rathore, Vasundhra, MD   8.6 mg at 04/09/24 1050      Discharge Medications: Please see discharge summary for a list of discharge medications.  Relevant Imaging Results:  Relevant Lab Results:   Additional Information SSN# [id number]  Inocente GORMAN Kindle, LCSW     "

## 2024-04-10 DIAGNOSIS — R55 Syncope and collapse: Secondary | ICD-10-CM | POA: Diagnosis not present

## 2024-04-10 NOTE — Plan of Care (Signed)

## 2024-04-10 NOTE — Plan of Care (Signed)

## 2024-04-10 NOTE — Evaluation (Signed)
 Physical Therapy Evaluation Patient Details Name: Tyrone Noble MRN: [id number] DOB: May 06, 1950 Today's Date: 04/10/2024  History of Present Illness  Pt is a 74 y.o. male presenting on 01/19 from memory care unit for syncopal event, and suspected seizure. Admitted for suspected breakthrough seizure, questionable UTI.  Imaging negative. PMH includes: HTN, CAD, CVA, dementia, parkinsonism, seizure disorder.   Clinical Impression  Pt is currently mobilizing at his baseline and is a resident at [facility] LTC memory care unit. Pt with difficulty responding to questions and frequently nods and states yeah when asked PLOF. Pt requires modA for bed mobility for LE management and light trunk assist. Light guarding for trunk control provided EOB due to pt having forward lean. Pt requires minAx2 for STS and modAx2 to step pivot to chair. Pt demonstrates difficulty clearing R LE from the ground and requires modA to appropriately position for transfer. Pt improves with repetition and completes the final 1-2 steps with assist only for balance. PT to sign off at this time as pt is mobilizing at his baseline. Re-consult as needed if mobility needs change. Pt appropriate to discharge to LTC facility when medically ready.        If plan is discharge home, recommend the following: Two people to help with walking and/or transfers;Two people to help with bathing/dressing/bathroom;Assistance with cooking/housework;Assistance with feeding;Direct supervision/assist for medications management;Direct supervision/assist for financial management;Assist for transportation;Help with stairs or ramp for entrance;Supervision due to cognitive status   Can travel by private vehicle   No (Level of assist required)    Equipment Recommendations None recommended by PT (Pt has appropriate equipment at facility)  Recommendations for Other Services       Functional Status Assessment Patient has not had a recent decline in  their functional status     Precautions / Restrictions Precautions Precautions: Fall Recall of Precautions/Restrictions: Impaired Restrictions Weight Bearing Restrictions Per Provider Order: No      Mobility  Bed Mobility Overal bed mobility: Needs Assistance Bed Mobility: Supine to Sit     Supine to sit: Mod assist     General bed mobility comments: Pt does well managing trunk while pivoting to sit EOB. Assist with LE management, light guarding for trunk control.    Transfers Overall transfer level: Needs assistance Equipment used: 2 person hand held assist Transfers: Sit to/from Stand, Bed to chair/wheelchair/BSC Sit to Stand: Min assist, +2 physical assistance   Step pivot transfers: Mod assist, +2 physical assistance       General transfer comment: Assist needed for balance upon standing. Pt with difficulty clearing R LE when stepping and pivoting towards chair. ModA required to position R LE, improved with reps and pt able to take final steps to chair without assist for R LE placement. ModAx2 required for balance.    Ambulation/Gait                  Stairs            Wheelchair Mobility     Tilt Bed    Modified Rankin (Stroke Patients Only)       Balance Overall balance assessment: Needs assistance Sitting-balance support: Feet supported Sitting balance-Leahy Scale: Fair Sitting balance - Comments: Light guarding for trunk control EOB, demonstrates forward posture. Postural control: Posterior lean Standing balance support: Bilateral upper extremity supported, During functional activity Standing balance-Leahy Scale: Poor Standing balance comment: Requires assist x2 to maintain balance and take steps to chair. Tremors affecting balance.  Pertinent Vitals/Pain Pain Assessment Pain Assessment: Faces Faces Pain Scale: No hurt Pain Intervention(s): Monitored during session, Limited activity within  patient's tolerance    Home Living Family/patient expects to be discharged to:: Skilled nursing facility                   Additional Comments: LTC memory care facility    Prior Function Prior Level of Function : Needs assist;Patient poor historian/Family not available             Mobility Comments: Pt reports using wheelchair at baseline but is unable to report stand pivot vs hoyer to transfer. ADLs Comments: Anticipate pt is dependent for ADLs and IADLs.     Extremity/Trunk Assessment   Upper Extremity Assessment Upper Extremity Assessment: Defer to OT evaluation    Lower Extremity Assessment Lower Extremity Assessment: Generalized weakness (Difficulty following cues for MMT, demonstrates partial ROM against gravity while sitting EOB)    Cervical / Trunk Assessment Cervical / Trunk Assessment: Kyphotic  Communication   Communication Communication: Impaired Factors Affecting Communication: Difficulty expressing self;Reduced clarity of speech    Cognition Arousal: Alert Behavior During Therapy: Flat affect   PT - Cognitive impairments: History of cognitive impairments, Problem solving, Initiation, Sequencing                       PT - Cognition Comments: Pt repeatedly nods and states yeah when asked questions. Unclear what reported information was accurate. Following commands: Impaired Following commands impaired: Follows one step commands with increased time     Cueing Cueing Techniques: Verbal cues, Gestural cues, Tactile cues     General Comments General comments (skin integrity, edema, etc.): VSS throughout. No significant skin abnormalities noted.    Exercises     Assessment/Plan    PT Assessment Patient does not need any further PT services  PT Problem List         PT Treatment Interventions      PT Goals (Current goals can be found in the Care Plan section)  Acute Rehab PT Goals Patient Stated Goal: None stated     Frequency       Co-evaluation PT/OT/SLP Co-Evaluation/Treatment: Yes Reason for Co-Treatment: Complexity of the patient's impairments (multi-system involvement) PT goals addressed during session: Mobility/safety with mobility;Balance OT goals addressed during session: ADL's and self-care       AM-PAC PT 6 Clicks Mobility  Outcome Measure Help needed turning from your back to your side while in a flat bed without using bedrails?: A Lot Help needed moving from lying on your back to sitting on the side of a flat bed without using bedrails?: A Lot Help needed moving to and from a bed to a chair (including a wheelchair)?: A Lot Help needed standing up from a chair using your arms (e.g., wheelchair or bedside chair)?: A Little Help needed to walk in hospital room?: Total Help needed climbing 3-5 steps with a railing? : Total 6 Click Score: 11    End of Session   Activity Tolerance: Patient tolerated treatment well Patient left: in chair;with call bell/phone within reach;with bed alarm set;with nursing/sitter in room Nurse Communication: Mobility status PT Visit Diagnosis: Unsteadiness on feet (R26.81);Muscle weakness (generalized) (M62.81)    Time: 8988-8971 PT Time Calculation (min) (ACUTE ONLY): 17 min   Charges:   PT Evaluation $PT Eval Moderate Complexity: 1 Mod   PT General Charges $$ ACUTE PT VISIT: 1 Visit  Sabra Morel, PT, DPT  Acute Rehabilitation Services         Office: [phone]     Sabra MARLA Morel 04/10/2024, 1:09 PM

## 2024-04-10 NOTE — TOC Progression Note (Signed)
 Transition of Care [facility]) - Progression Note    Patient Details  Name: Tyrone Noble MRN: [id number] Date of Birth: [birth year]  [facility]) CM/SW Contact  Inocente GORMAN Kindle, [place] Phone Number: 04/10/2024, 11:47 AM  Clinical Narrative:    [facility] able to accept patient today. Left vm for patient's daughter, [name].     Expected Discharge Plan: Skilled Nursing Facility Barriers to Discharge: Barriers Resolved               Expected Discharge Plan and Services In-house Referral: Clinical Social Work   Post Acute Care Choice: Skilled Nursing Facility Living arrangements for the past 2 months: Skilled Nursing Facility Expected Discharge Date: 04/10/24                                     Social Drivers of Health (SDOH) Interventions SDOH Screenings   Food Insecurity: Patient Declined (04/08/2024)  Housing: Unknown (04/08/2024)  Transportation Needs: Patient Unable To Answer (04/08/2024)  Utilities: Patient Unable To Answer (04/08/2024)  Social Connections: Unknown (04/08/2024)  Tobacco Use: Medium Risk (04/07/2024)    Readmission Risk Interventions     No data to display

## 2024-04-10 NOTE — TOC Transition Note (Signed)
 Transition of Care [facility]) - Discharge Note   Patient Details  Name: Tyrone Noble MRN: [id number] Date of Birth: 02/18/1951  Transition of Care [facility]) CM/SW Contact:  Inocente GORMAN Kindle, LCSW Phone Number: 04/10/2024, 12:47 PM   Clinical Narrative:    Patient will DC to: [facility] Anticipated DC date: 04/10/24 Family notified: Left vm for daughter, [name] Transport by: [facility]   Per MD patient ready for DC to [facility] LTC. RN to call report prior to discharge [phone]). RN, patient, patient's family, and facility notified of DC. Discharge Summary and FL2 sent to facility. DC packet on chart. Ambulance transport requested for patient.   CSW will sign off for now as social work intervention is no longer needed. Please consult us  again if new needs arise.     Final next level of care: Skilled Nursing Facility Barriers to Discharge: Barriers Resolved   Patient Goals and CMS Choice Patient states their goals for this hospitalization and ongoing recovery are:: Return to snf          Discharge Placement   Existing PASRR number confirmed : 04/10/24          Patient [facility] bed at: [facility] Patient to be transferred to facility by: [facility] Name of family member notified: Daughter, [name] Patient and family notified of of transfer: 04/10/24  Discharge Plan and Services Additional resources added to the After Visit Summary for   In-house Referral: Clinical Social Work   Post Acute Care Choice: Skilled Nursing Facility                               Social Drivers of Health (SDOH) Interventions SDOH Screenings   Food Insecurity: Patient Declined (04/08/2024)  Housing: Unknown (04/08/2024)  Transportation Needs: Patient Unable To Answer (04/08/2024)  Utilities: Patient Unable To Answer (04/08/2024)  Social Connections: Unknown (04/08/2024)  Tobacco Use: Medium Risk (04/07/2024)     Readmission Risk Interventions     No data to display

## 2024-04-10 NOTE — Evaluation (Signed)
 Occupational Therapy Evaluation Patient Details Name: Tyrone Noble MRN: [id number] DOB: 11/11/1950 Today's Date: 04/10/2024   History of Present Illness   Pt is a 74 y.o. male presenting on 01/19 from memory care unit for syncopal event, and suspected seizure. Admitted for suspected breakthrough seizure, questionable UTI.  Imaging negative. PMH includes: HTN, CAD, CVA, dementia, parkinsonism, seizure disorder.     Clinical Impressions Patient admitted for the diagnosis above.  Patient resides at a local SNF in the memory care unit.  No real skilled OT exists in the acute setting.  Recommend up with staff for meals and assist to self feed.  Recommend return to SNF once cleared medically.  Will defer to facility OT for any changes in status.       If plan is discharge home, recommend the following:   Assistance with feeding;Two people to help with walking and/or transfers;A lot of help with bathing/dressing/bathroom     Functional Status Assessment   Patient has not had a recent decline in their functional status     Equipment Recommendations   None recommended by OT     Recommendations for Other Services         Precautions/Restrictions   Precautions Precautions: Fall Recall of Precautions/Restrictions: Impaired Restrictions Weight Bearing Restrictions Per Provider Order: No     Mobility Bed Mobility Overal bed mobility: Needs Assistance Bed Mobility: Supine to Sit     Supine to sit: Mod assist          Transfers Overall transfer level: Needs assistance   Transfers: Sit to/from Stand, Bed to chair/wheelchair/BSC Sit to Stand: Min assist, +2 physical assistance     Step pivot transfers: Mod assist, +2 physical assistance            Balance Overall balance assessment: Needs assistance Sitting-balance support: Feet supported Sitting balance-Leahy Scale: Fair   Postural control: Posterior lean Standing balance support: Bilateral upper  extremity supported Standing balance-Leahy Scale: Poor                             ADL either performed or assessed with clinical judgement   ADL                                         General ADL Comments: Assist as needed via facility staff     Vision   Vision Assessment?: No apparent visual deficits     Perception Perception: Not tested       Praxis Praxis: Not tested       Pertinent Vitals/Pain Pain Assessment Pain Assessment: Faces Faces Pain Scale: No hurt Pain Intervention(s): Monitored during session     Extremity/Trunk Assessment Upper Extremity Assessment Upper Extremity Assessment: Generalized weakness;Right hand dominant   Lower Extremity Assessment Lower Extremity Assessment: Defer to PT evaluation   Cervical / Trunk Assessment Cervical / Trunk Assessment: Kyphotic   Communication Communication Communication: Impaired Factors Affecting Communication: Difficulty expressing self   Cognition Arousal: Alert Behavior During Therapy: Flat affect Cognition: No family/caregiver present to determine baseline                               Following commands: Impaired Following commands impaired: Follows one step commands with increased time     Cueing  General Comments  Cueing Techniques: Verbal cues;Gestural cues;Tactile cues      Exercises     Shoulder Instructions      Home Living Family/patient expects to be discharged to:: Skilled nursing facility                                 Additional Comments: LTC memory care facility      Prior Functioning/Environment Prior Level of Function : Needs assist;Patient poor historian/Family not available                    OT Problem List: Impaired balance (sitting and/or standing)   OT Treatment/Interventions:        OT Goals(Current goals can be found in the care plan section)   Acute Rehab OT Goals Patient Stated Goal:  None stated OT Goal Formulation: Patient unable to participate in goal setting Time For Goal Achievement: 04/17/24 Potential to Achieve Goals: Good   OT Frequency:       Co-evaluation PT/OT/SLP Co-Evaluation/Treatment: Yes Reason for Co-Treatment: Complexity of the patient's impairments (multi-system involvement)   OT goals addressed during session: ADL's and self-care      AM-PAC OT 6 Clicks Daily Activity     Outcome Measure Help from another person eating meals?: A Lot Help from another person taking care of personal grooming?: A Lot Help from another person toileting, which includes using toliet, bedpan, or urinal?: Total Help from another person bathing (including washing, rinsing, drying)?: Total Help from another person to put on and taking off regular upper body clothing?: Total Help from another person to put on and taking off regular lower body clothing?: Total 6 Click Score: 8   End of Session Nurse Communication: Mobility status  Activity Tolerance: Patient tolerated treatment well Patient left: in chair;with call bell/phone within reach;with chair alarm set  OT Visit Diagnosis: Unsteadiness on feet (R26.81)                Time: 8988-8970 OT Time Calculation (min): 18 min Charges:  OT General Charges $OT Visit: 1 Visit OT Evaluation $OT Eval Moderate Complexity: 1 Mod  04/10/2024  RP, OTR/L  Acute Rehabilitation Services  Office:  [phone]   Charlie JONETTA Halsted 04/10/2024, 10:58 AM

## 2024-04-10 NOTE — Discharge Summary (Signed)
 " Physician Discharge Summary   Patient: Tyrone Noble MRN: [id number] DOB: 11/19/1950  Admit date:     04/07/2024  Discharge date: 04/10/24  Discharge Physician: Yetta Blanch  PCP: Pcp, No  Disposition: [facility] care unit.  Recommendations at discharge: Follow up with PCP in 1 to 2 weeks. Follow-up with Dr. Levern for 30-day heart monitor.   Follow-up Information     Levern Hutching, MD. Schedule an appointment as soon as possible for a visit in 1 week(s).   Specialty: Cardiology Why: for 30 day event monitor for heart. Contact information: [address] [place] [address] [phone]                Hospital Course: Patient with PMH of Parkinson disease, dementia, seizures, CAD SP PCI, HTN, hypothyroidism, CVA, depression presented to the hospital with complaints of syncope. Reportedly his blood pressure was 140s systolic and dropped down to 60s or 70s systolic.  EMS found the patient with normal blood pressure.   Significant events: 02/17/2024 seen in ED with unresponsiveness. 03/18/2024 reported another episode of syncope. 1/19 admit to [facility].  Neurology was consulted. 1/21 EP consulted.  Recommend 30-day event monitor.  Echocardiogram performed EF 55 to 60%.  1 missed beat noted on telemetry.  Positive orthostatics.   Consults: Electrophysiology Neurology    Assessment and Plan: Recurrent syncopal events. Bradycardia with cardiac arrhythmias. Appreciate EP consultation. At present recommendation is for consideration of 30-day event monitor. Patient was admitted for observation and reportedly was to be seen by cardiology for ILR placement. Echocardiogram performed as a part of workup, shows preserved EF without any significant wall motion or valvular abnormality. EP has forwarded their note to patient's primary cardiologist Dr. Levern.   Parkinson's disease. Dementia. Mood disorder. Known history of Parkinson's, appears to  be significantly advanced. On Sinemet  and risperidone . His presentation with recurrent syncope as well as bradycardia and tachycardia events as well as high and low blood pressure could be consistent with autonomic dysfunction related to Parkinson's disease. For now monitor.   Seizure disorder. Seen by neurology at the time of the admission. Recommending to continue Depakote  1 g twice daily and Keppra  1 g twice daily. So far no seizures seen here.   History of hypertension. Hypotension. Presented with significantly low blood pressure reportedly by nursing home staff. Blood pressure here has been stable after holding home medication. Appears to be on losartan  HCTZ Eliquis and metoprolol  on home. Would recommend to discontinue this medication going forward. Orthostatic vitals are negative. Blood pressure is normal. Would avoid aggressive blood pressure control.   Orthostasis. Staff reported that while they were trying to stand him up his heart rate jumped significantly and patient was reporting to having some dizziness although no blood pressure was checked. Treated with IV fluid. Orthostatic vitals are normal.   Chronic constipation. Continue home medication.   HLD. Lipitor .   Family Communication: Plan was discussed with Family.   Consultants:  EP Neurology  Procedures performed:  Echocardiogram   DISCHARGE MEDICATION: Allergies as of 04/10/2024       Reactions   Aricept  [donepezil  Hcl] Nausea Only   Penicillins Nausea And Vomiting        Medication List     STOP taking these medications    losartan -hydrochlorothiazide 100-12.5 MG tablet Commonly known as: HYZAAR   metoprolol  succinate 25 MG 24 hr tablet Commonly known as: TOPROL -XL       TAKE these medications  carbidopa -levodopa  25-250 MG tablet Commonly known as: SINEMET  IR TAKE 1 TABLET 4 TIMES A DAY The timing of this medication is very important.   aspirin  81 MG chewable tablet Chew 1  tablet (81 mg total) by mouth daily.   atorvastatin  20 MG tablet Commonly known as: LIPITOR  Take 20 mg by mouth at bedtime.   bisacodyl  10 MG suppository Commonly known as: DULCOLAX Place 1 suppository (10 mg total) rectally daily as needed for moderate constipation.   divalproex  125 MG capsule Commonly known as: DEPAKOTE  SPRINKLE Take 8 capsules (1,000 mg total) by mouth every 12 (twelve) hours. What changed:  how much to take when to take this additional instructions   levETIRAcetam  100 MG/ML solution Commonly known as: KEPPRA  Take 1,000 mg by mouth 2 (two) times daily.   polyethylene glycol 17 g packet Commonly known as: MIRALAX  / GLYCOLAX  Take 17 g by mouth daily as needed. What changed: reasons to take this   risperiDONE  1 MG tablet Commonly known as: RISPERDAL  Take 1 tablet (1 mg total) by mouth at bedtime.   senna 8.6 MG Tabs tablet Commonly known as: SENOKOT Take 1 tablet (8.6 mg total) by mouth daily.        Diet recommendation: Regular diet  Discharge Exam: Vitals:   04/10/24 0035 04/10/24 0300 04/10/24 0400 04/10/24 0800  BP: 123/61 135/75 119/70 (!) 103/57  Pulse:  66 67 65  Resp:  16 11 13   Temp: 97.7 F (36.5 C) (!) 97.5 F (36.4 C)  97.6 F (36.4 C)  TempSrc: Axillary Axillary  Axillary  SpO2:  98% 97% 99%  Weight:      Height:        Filed Weights   04/08/24 0058  Weight: 72.6 kg   General: in Mild distress, No Rash Cardiovascular: S1 and S2 Present, No Murmur Respiratory: Good respiratory effort, Bilateral Air entry present. No Crackles, No wheezes Abdomen: Bowel Sound present, No tenderness Extremities: No edema Neuro: Alert and oriented to self , no new focal deficit   Condition at discharge: stable  The results of significant diagnostics from this hospitalization (including imaging, microbiology, ancillary and laboratory) are listed below for reference.   Imaging Studies: ECHOCARDIOGRAM COMPLETE Result Date: 04/09/2024     ECHOCARDIOGRAM REPORT   Patient Name:   Tyrone Noble Date of Exam: 04/09/2024 Medical Rec #:  [id number]          Height:       64.0 in Accession #:    [id number]         Weight:       160.1 lb Date of Birth:  [birth year]          BSA:          1.780 m Patient Age:    74 years           BP:           101/59 mmHg Patient Gender: M                  HR:           70 bpm. Exam Location:  Inpatient Procedure: 2D Echo, Cardiac Doppler and Color Doppler (Both Spectral and Color            Flow Doppler were utilized during procedure). Indications:    Syncope  History:        Patient has no prior history of Echocardiogram examinations.  Angina and CAD, Stroke, Signs/Symptoms:Syncope; Risk                 Factors:Dyslipidemia and Hypertension.  Sonographer:    Juliene Rucks Referring Phys: [id number] RENEE LYNN URSUY IMPRESSIONS  1. Left ventricular ejection fraction, by estimation, is 55 to 60%. The left ventricle has normal function. The left ventricle demonstrates global hypokinesis. There is mild concentric left ventricular hypertrophy. Left ventricular diastolic parameters are consistent with Grade I diastolic dysfunction (impaired relaxation).  2. Right ventricular systolic function is normal. The right ventricular size is normal. Tricuspid regurgitation signal is inadequate for assessing PA pressure.  3. The mitral valve is normal in structure. No evidence of mitral valve regurgitation. No evidence of mitral stenosis.  4. The aortic valve is normal in structure. Aortic valve regurgitation is not visualized. No aortic stenosis is present.  5. The inferior vena cava is normal in size with greater than 50% respiratory variability, suggesting right atrial pressure of 3 mmHg. FINDINGS  Left Ventricle: Left ventricular ejection fraction, by estimation, is 55 to 60%. The left ventricle has normal function. The left ventricle demonstrates global hypokinesis. The left ventricular internal cavity size was normal in  size. There is mild concentric left ventricular hypertrophy. Left ventricular diastolic parameters are consistent with Grade I diastolic dysfunction (impaired relaxation). Right Ventricle: The right ventricular size is normal. No increase in right ventricular wall thickness. Right ventricular systolic function is normal. Tricuspid regurgitation signal is inadequate for assessing PA pressure. Left Atrium: Left atrial size was normal in size. Right Atrium: Right atrial size was normal in size. Pericardium: There is no evidence of pericardial effusion. Presence of epicardial fat layer. Mitral Valve: The mitral valve is normal in structure. No evidence of mitral valve regurgitation. No evidence of mitral valve stenosis. Tricuspid Valve: The tricuspid valve is normal in structure. Tricuspid valve regurgitation is not demonstrated. No evidence of tricuspid stenosis. Aortic Valve: The aortic valve is normal in structure. Aortic valve regurgitation is not visualized. No aortic stenosis is present. Pulmonic Valve: The pulmonic valve was normal in structure. Pulmonic valve regurgitation is not visualized. No evidence of pulmonic stenosis. Aorta: The aortic root is normal in size and structure. Venous: The inferior vena cava is normal in size with greater than 50% respiratory variability, suggesting right atrial pressure of 3 mmHg. IAS/Shunts: No atrial level shunt detected by color flow Doppler.  LEFT VENTRICLE PLAX 2D LVIDd:         4.10 cm     Diastology LVIDs:         3.10 cm     LV e' medial:    5.44 cm/s LV PW:         1.10 cm     LV E/e' medial:  12.3 LV IVS:        1.20 cm     LV e' lateral:   4.68 cm/s LVOT diam:     2.00 cm     LV E/e' lateral: 14.3 LV SV:         50 LV SV Index:   28 LVOT Area:     3.14 cm  LV Volumes (MOD) LV vol d, MOD A4C: 75.8 ml LV vol s, MOD A4C: 29.2 ml LV SV MOD A4C:     75.8 ml RIGHT VENTRICLE RV Basal diam:  3.10 cm RV Mid diam:    2.50 cm RV S prime:     11.10 cm/s TAPSE (M-mode): 1.4 cm  LEFT ATRIUM  Index LA diam:      2.60 cm 1.46 cm/m LA Vol (A4C): 20.0 ml 11.24 ml/m  AORTIC VALVE LVOT Vmax:   81.00 cm/s LVOT Vmean:  50.900 cm/s LVOT VTI:    0.159 m  AORTA Ao Root diam: 2.90 cm MITRAL VALVE MV Area (PHT): 3.27 cm    SHUNTS MV Decel Time: 232 msec    Systemic VTI:  0.16 m MV E velocity: 66.70 cm/s  Systemic Diam: 2.00 cm MV A velocity: 83.70 cm/s MV E/A ratio:  0.80 Kardie Tobb DO Electronically signed by Dub Huntsman DO Signature Date/Time: 04/09/2024/4:28:58 PM    Final     Microbiology: Results for orders placed or performed during the hospital encounter of 04/07/24  Urine Culture (for pregnant, neutropenic or urologic patients or patients with an indwelling urinary catheter)     Status: Abnormal   Collection Time: 04/07/24 10:12 PM   Specimen: Urine, Clean Catch  Result Value Ref Range Status   Specimen Description URINE, CLEAN CATCH  Final   Special Requests NONE  Final   Culture (A)  Final    <10,000 COLONIES/mL INSIGNIFICANT GROWTH Performed at [facility], [address], [place], [place] [address]    Report Status 04/09/2024 FINAL  Final   Labs: CBC: Recent Labs  Lab 04/07/24 1645  WBC 8.0  HGB 14.3  HCT 44.2  MCV 90.8  PLT 220   Basic Metabolic Panel: Recent Labs  Lab 04/07/24 1645  NA 141  K 4.6  CL 103  CO2 28  GLUCOSE 95  BUN 17  CREATININE 1.21  CALCIUM  9.2  MG 2.2   Liver Function Tests: Recent Labs  Lab 04/07/24 1645  AST 20  ALT <5  ALKPHOS 76  BILITOT 0.3  PROT 6.9  ALBUMIN 3.9   CBG: Recent Labs  Lab 04/08/24 0017  GLUCAP 89    Discharge time spent: 39 minutes  Author: Yetta Blanch, MD  [facility]    "

## 2025-01-15 ENCOUNTER — Ambulatory Visit: Admitting: Neurology
